# Patient Record
Sex: Male | Born: 1970 | Race: Black or African American | Hispanic: No | Marital: Single | Smoking: Former smoker
Health system: Southern US, Community
[De-identification: ages and names within clinical notes are randomized; demographics above are authoritative.]

## PROBLEM LIST (undated history)

## (undated) DIAGNOSIS — R519 Headache, unspecified: Secondary | ICD-10-CM

## (undated) DIAGNOSIS — Z9289 Personal history of other medical treatment: Secondary | ICD-10-CM

## (undated) DIAGNOSIS — I82409 Acute embolism and thrombosis of unspecified deep veins of unspecified lower extremity: Secondary | ICD-10-CM

## (undated) DIAGNOSIS — K219 Gastro-esophageal reflux disease without esophagitis: Secondary | ICD-10-CM

## (undated) DIAGNOSIS — F419 Anxiety disorder, unspecified: Secondary | ICD-10-CM

## (undated) DIAGNOSIS — Z789 Other specified health status: Secondary | ICD-10-CM

## (undated) DIAGNOSIS — R9431 Abnormal electrocardiogram [ECG] [EKG]: Secondary | ICD-10-CM

## (undated) DIAGNOSIS — L89159 Pressure ulcer of sacral region, unspecified stage: Secondary | ICD-10-CM

## (undated) DIAGNOSIS — E43 Unspecified severe protein-calorie malnutrition: Secondary | ICD-10-CM

## (undated) DIAGNOSIS — R51 Headache: Secondary | ICD-10-CM

## (undated) DIAGNOSIS — F32A Depression, unspecified: Secondary | ICD-10-CM

## (undated) DIAGNOSIS — F329 Major depressive disorder, single episode, unspecified: Secondary | ICD-10-CM

## (undated) DIAGNOSIS — A150 Tuberculosis of lung: Secondary | ICD-10-CM

## (undated) DIAGNOSIS — N390 Urinary tract infection, site not specified: Secondary | ICD-10-CM

## (undated) DIAGNOSIS — D649 Anemia, unspecified: Secondary | ICD-10-CM

## (undated) DIAGNOSIS — G8220 Paraplegia, unspecified: Secondary | ICD-10-CM

## (undated) HISTORY — DX: Pressure ulcer of sacral region, unspecified stage: L89.159

## (undated) HISTORY — PX: HIP SURGERY: SHX245

## (undated) HISTORY — DX: Paraplegia, unspecified: G82.20

## (undated) HISTORY — DX: Unspecified severe protein-calorie malnutrition: E43

## (undated) HISTORY — DX: Urinary tract infection, site not specified: N39.0

## (undated) HISTORY — PX: DEBRIDMENT OF DECUBITUS ULCER: SHX6276

## (undated) HISTORY — DX: Acute embolism and thrombosis of unspecified deep veins of unspecified lower extremity: I82.409

---
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
## 2004-07-07 DIAGNOSIS — I82409 Acute embolism and thrombosis of unspecified deep veins of unspecified lower extremity: Secondary | ICD-10-CM

## 2004-07-07 HISTORY — DX: Acute embolism and thrombosis of unspecified deep veins of unspecified lower extremity: I82.409

## 2004-07-07 HISTORY — PX: OTHER SURGICAL HISTORY: SHX169

## 2005-04-01 ENCOUNTER — Emergency Department (HOSPITAL_COMMUNITY): Admission: EM | Admit: 2005-04-01 | Discharge: 2005-04-01 | Payer: Self-pay | Admitting: Emergency Medicine

## 2005-04-06 HISTORY — PX: VENA CAVA FILTER PLACEMENT: SUR1032

## 2005-04-24 ENCOUNTER — Inpatient Hospital Stay (HOSPITAL_COMMUNITY): Admission: EM | Admit: 2005-04-24 | Discharge: 2005-05-01 | Payer: Self-pay | Admitting: Emergency Medicine

## 2005-04-24 ENCOUNTER — Ambulatory Visit: Payer: Self-pay | Admitting: Physical Medicine & Rehabilitation

## 2005-05-01 ENCOUNTER — Inpatient Hospital Stay (HOSPITAL_COMMUNITY)
Admission: RE | Admit: 2005-05-01 | Discharge: 2005-06-03 | Payer: Self-pay | Admitting: Physical Medicine & Rehabilitation

## 2005-05-01 ENCOUNTER — Ambulatory Visit: Payer: Self-pay | Admitting: Infectious Diseases

## 2005-07-10 ENCOUNTER — Ambulatory Visit: Payer: Self-pay | Admitting: Physical Medicine & Rehabilitation

## 2005-07-10 ENCOUNTER — Encounter
Admission: RE | Admit: 2005-07-10 | Discharge: 2005-10-08 | Payer: Self-pay | Admitting: Physical Medicine & Rehabilitation

## 2005-07-28 ENCOUNTER — Encounter
Admission: RE | Admit: 2005-07-28 | Discharge: 2005-09-22 | Payer: Self-pay | Admitting: Physical Medicine & Rehabilitation

## 2005-07-30 ENCOUNTER — Ambulatory Visit: Payer: Self-pay | Admitting: Internal Medicine

## 2005-08-11 ENCOUNTER — Ambulatory Visit: Payer: Self-pay | Admitting: Internal Medicine

## 2005-08-11 ENCOUNTER — Inpatient Hospital Stay (HOSPITAL_COMMUNITY): Admission: EM | Admit: 2005-08-11 | Discharge: 2005-08-18 | Payer: Self-pay | Admitting: Emergency Medicine

## 2005-08-14 ENCOUNTER — Ambulatory Visit: Payer: Self-pay | Admitting: Plastic Surgery

## 2005-09-01 ENCOUNTER — Ambulatory Visit: Payer: Self-pay | Admitting: Cardiology

## 2005-09-01 ENCOUNTER — Ambulatory Visit: Payer: Self-pay | Admitting: Internal Medicine

## 2005-09-01 ENCOUNTER — Inpatient Hospital Stay (HOSPITAL_COMMUNITY): Admission: EM | Admit: 2005-09-01 | Discharge: 2005-09-08 | Payer: Self-pay | Admitting: Emergency Medicine

## 2005-09-05 ENCOUNTER — Encounter: Payer: Self-pay | Admitting: Cardiology

## 2005-09-29 ENCOUNTER — Ambulatory Visit: Payer: Self-pay | Admitting: Internal Medicine

## 2005-11-20 ENCOUNTER — Encounter
Admission: RE | Admit: 2005-11-20 | Discharge: 2006-02-18 | Payer: Self-pay | Admitting: Physical Medicine & Rehabilitation

## 2005-11-20 ENCOUNTER — Ambulatory Visit: Payer: Self-pay | Admitting: Physical Medicine & Rehabilitation

## 2005-11-20 ENCOUNTER — Ambulatory Visit: Payer: Self-pay | Admitting: Internal Medicine

## 2005-12-29 ENCOUNTER — Emergency Department (HOSPITAL_COMMUNITY): Admission: EM | Admit: 2005-12-29 | Discharge: 2005-12-29 | Payer: Self-pay | Admitting: Emergency Medicine

## 2006-01-14 ENCOUNTER — Ambulatory Visit: Payer: Self-pay | Admitting: Internal Medicine

## 2006-03-12 ENCOUNTER — Ambulatory Visit: Payer: Self-pay | Admitting: Hospitalist

## 2006-04-17 ENCOUNTER — Encounter (INDEPENDENT_AMBULATORY_CARE_PROVIDER_SITE_OTHER): Payer: Self-pay | Admitting: Internal Medicine

## 2006-04-17 ENCOUNTER — Ambulatory Visit: Payer: Self-pay | Admitting: Internal Medicine

## 2006-04-17 LAB — CONVERTED CEMR LAB
Bilirubin Urine: NEGATIVE
Hgb urine dipstick: NEGATIVE
Ketones, ur: NEGATIVE mg/dL
Nitrite: NEGATIVE
Urobilinogen, UA: 0.2 (ref 0.0–1.0)

## 2006-05-07 DIAGNOSIS — L89159 Pressure ulcer of sacral region, unspecified stage: Secondary | ICD-10-CM

## 2006-05-07 HISTORY — DX: Pressure ulcer of sacral region, unspecified stage: L89.159

## 2006-05-13 ENCOUNTER — Emergency Department (HOSPITAL_COMMUNITY): Admission: EM | Admit: 2006-05-13 | Discharge: 2006-05-14 | Payer: Self-pay | Admitting: Emergency Medicine

## 2006-05-18 ENCOUNTER — Ambulatory Visit: Payer: Self-pay | Admitting: *Deleted

## 2006-05-18 ENCOUNTER — Inpatient Hospital Stay (HOSPITAL_COMMUNITY): Admission: AD | Admit: 2006-05-18 | Discharge: 2006-06-02 | Payer: Self-pay | Admitting: Hospitalist

## 2006-05-18 ENCOUNTER — Ambulatory Visit: Payer: Self-pay | Admitting: Hospitalist

## 2006-05-19 ENCOUNTER — Encounter (INDEPENDENT_AMBULATORY_CARE_PROVIDER_SITE_OTHER): Payer: Self-pay | Admitting: Internal Medicine

## 2006-05-19 LAB — CONVERTED CEMR LAB
Bacteria, UA: NONE SEEN
Bilirubin Urine: NEGATIVE
Hemoglobin, Urine: NEGATIVE
Ketones, ur: NEGATIVE mg/dL
Nitrite: NEGATIVE
Protein, ur: NEGATIVE mg/dL
RBC / HPF: NONE SEEN
Specific Gravity, Urine: 1.006
Urine Glucose: NEGATIVE mg/dL
Urobilinogen, UA: 0.2
WBC, UA: NONE SEEN {cells}/[HPF]
pH: 7

## 2006-05-22 ENCOUNTER — Encounter (INDEPENDENT_AMBULATORY_CARE_PROVIDER_SITE_OTHER): Payer: Self-pay | Admitting: *Deleted

## 2006-05-24 ENCOUNTER — Encounter (INDEPENDENT_AMBULATORY_CARE_PROVIDER_SITE_OTHER): Payer: Self-pay | Admitting: *Deleted

## 2006-05-25 ENCOUNTER — Ambulatory Visit: Payer: Self-pay | Admitting: Infectious Diseases

## 2006-07-16 ENCOUNTER — Ambulatory Visit: Payer: Self-pay | Admitting: Internal Medicine

## 2006-07-16 ENCOUNTER — Encounter (INDEPENDENT_AMBULATORY_CARE_PROVIDER_SITE_OTHER): Payer: Self-pay | Admitting: Internal Medicine

## 2006-07-16 LAB — CONVERTED CEMR LAB
BUN: 11 mg/dL (ref 6–23)
Bilirubin Urine: NEGATIVE
Chloride: 104 meq/L (ref 96–112)
Glucose, Bld: 95 mg/dL (ref 70–99)
Leukocytes, UA: NEGATIVE
Potassium: 4.5 meq/L (ref 3.5–5.3)
Protein, ur: NEGATIVE mg/dL
Sodium: 140 meq/L (ref 135–145)
Urine Glucose: NEGATIVE mg/dL
pH: 6 (ref 5.0–8.0)

## 2006-07-28 ENCOUNTER — Telehealth (INDEPENDENT_AMBULATORY_CARE_PROVIDER_SITE_OTHER): Payer: Self-pay | Admitting: Internal Medicine

## 2006-07-30 ENCOUNTER — Telehealth (INDEPENDENT_AMBULATORY_CARE_PROVIDER_SITE_OTHER): Payer: Self-pay | Admitting: Internal Medicine

## 2006-07-31 ENCOUNTER — Telehealth (INDEPENDENT_AMBULATORY_CARE_PROVIDER_SITE_OTHER): Payer: Self-pay | Admitting: Internal Medicine

## 2006-08-18 ENCOUNTER — Encounter (INDEPENDENT_AMBULATORY_CARE_PROVIDER_SITE_OTHER): Payer: Self-pay | Admitting: Internal Medicine

## 2006-08-19 ENCOUNTER — Telehealth: Payer: Self-pay | Admitting: *Deleted

## 2006-08-19 ENCOUNTER — Encounter (INDEPENDENT_AMBULATORY_CARE_PROVIDER_SITE_OTHER): Payer: Self-pay | Admitting: Internal Medicine

## 2006-08-25 ENCOUNTER — Encounter (INDEPENDENT_AMBULATORY_CARE_PROVIDER_SITE_OTHER): Payer: Self-pay | Admitting: Internal Medicine

## 2006-09-01 ENCOUNTER — Encounter (INDEPENDENT_AMBULATORY_CARE_PROVIDER_SITE_OTHER): Payer: Self-pay | Admitting: Internal Medicine

## 2006-09-07 ENCOUNTER — Encounter (INDEPENDENT_AMBULATORY_CARE_PROVIDER_SITE_OTHER): Payer: Self-pay | Admitting: Internal Medicine

## 2006-09-09 ENCOUNTER — Telehealth (INDEPENDENT_AMBULATORY_CARE_PROVIDER_SITE_OTHER): Payer: Self-pay | Admitting: Internal Medicine

## 2006-09-14 ENCOUNTER — Telehealth (INDEPENDENT_AMBULATORY_CARE_PROVIDER_SITE_OTHER): Payer: Self-pay | Admitting: Internal Medicine

## 2006-09-28 ENCOUNTER — Encounter (INDEPENDENT_AMBULATORY_CARE_PROVIDER_SITE_OTHER): Payer: Self-pay | Admitting: Internal Medicine

## 2006-09-30 ENCOUNTER — Encounter (INDEPENDENT_AMBULATORY_CARE_PROVIDER_SITE_OTHER): Payer: Self-pay | Admitting: Internal Medicine

## 2006-10-21 ENCOUNTER — Encounter (INDEPENDENT_AMBULATORY_CARE_PROVIDER_SITE_OTHER): Payer: Self-pay | Admitting: Internal Medicine

## 2006-10-22 ENCOUNTER — Encounter (INDEPENDENT_AMBULATORY_CARE_PROVIDER_SITE_OTHER): Payer: Self-pay | Admitting: Internal Medicine

## 2006-10-29 ENCOUNTER — Encounter (INDEPENDENT_AMBULATORY_CARE_PROVIDER_SITE_OTHER): Payer: Self-pay | Admitting: Internal Medicine

## 2006-11-19 ENCOUNTER — Encounter (INDEPENDENT_AMBULATORY_CARE_PROVIDER_SITE_OTHER): Payer: Self-pay | Admitting: Internal Medicine

## 2006-12-08 ENCOUNTER — Telehealth (INDEPENDENT_AMBULATORY_CARE_PROVIDER_SITE_OTHER): Payer: Self-pay | Admitting: Internal Medicine

## 2006-12-09 ENCOUNTER — Encounter (INDEPENDENT_AMBULATORY_CARE_PROVIDER_SITE_OTHER): Payer: Self-pay | Admitting: Internal Medicine

## 2006-12-09 ENCOUNTER — Encounter (INDEPENDENT_AMBULATORY_CARE_PROVIDER_SITE_OTHER): Payer: Self-pay | Admitting: *Deleted

## 2006-12-11 ENCOUNTER — Encounter (INDEPENDENT_AMBULATORY_CARE_PROVIDER_SITE_OTHER): Payer: Self-pay | Admitting: Internal Medicine

## 2007-01-11 ENCOUNTER — Encounter (INDEPENDENT_AMBULATORY_CARE_PROVIDER_SITE_OTHER): Payer: Self-pay | Admitting: *Deleted

## 2007-02-09 ENCOUNTER — Ambulatory Visit: Payer: Self-pay | Admitting: Infectious Disease

## 2007-02-09 DIAGNOSIS — G8220 Paraplegia, unspecified: Secondary | ICD-10-CM

## 2007-02-09 DIAGNOSIS — M549 Dorsalgia, unspecified: Secondary | ICD-10-CM

## 2007-02-09 DIAGNOSIS — N319 Neuromuscular dysfunction of bladder, unspecified: Secondary | ICD-10-CM

## 2007-02-09 DIAGNOSIS — N390 Urinary tract infection, site not specified: Secondary | ICD-10-CM

## 2007-02-23 ENCOUNTER — Encounter (INDEPENDENT_AMBULATORY_CARE_PROVIDER_SITE_OTHER): Payer: Self-pay | Admitting: *Deleted

## 2007-02-23 ENCOUNTER — Telehealth (INDEPENDENT_AMBULATORY_CARE_PROVIDER_SITE_OTHER): Payer: Self-pay | Admitting: *Deleted

## 2007-02-25 ENCOUNTER — Encounter (INDEPENDENT_AMBULATORY_CARE_PROVIDER_SITE_OTHER): Payer: Self-pay | Admitting: *Deleted

## 2007-03-01 ENCOUNTER — Telehealth: Payer: Self-pay | Admitting: *Deleted

## 2007-03-04 ENCOUNTER — Encounter (INDEPENDENT_AMBULATORY_CARE_PROVIDER_SITE_OTHER): Payer: Self-pay | Admitting: *Deleted

## 2007-03-05 ENCOUNTER — Telehealth: Payer: Self-pay | Admitting: *Deleted

## 2007-03-09 ENCOUNTER — Encounter (INDEPENDENT_AMBULATORY_CARE_PROVIDER_SITE_OTHER): Payer: Self-pay | Admitting: *Deleted

## 2007-03-23 ENCOUNTER — Encounter (INDEPENDENT_AMBULATORY_CARE_PROVIDER_SITE_OTHER): Payer: Self-pay | Admitting: *Deleted

## 2007-04-14 ENCOUNTER — Encounter (INDEPENDENT_AMBULATORY_CARE_PROVIDER_SITE_OTHER): Payer: Self-pay | Admitting: *Deleted

## 2007-04-21 ENCOUNTER — Encounter (INDEPENDENT_AMBULATORY_CARE_PROVIDER_SITE_OTHER): Payer: Self-pay | Admitting: *Deleted

## 2007-04-22 ENCOUNTER — Encounter (INDEPENDENT_AMBULATORY_CARE_PROVIDER_SITE_OTHER): Payer: Self-pay | Admitting: *Deleted

## 2007-04-22 ENCOUNTER — Telehealth (INDEPENDENT_AMBULATORY_CARE_PROVIDER_SITE_OTHER): Payer: Self-pay | Admitting: *Deleted

## 2007-04-23 ENCOUNTER — Encounter (INDEPENDENT_AMBULATORY_CARE_PROVIDER_SITE_OTHER): Payer: Self-pay | Admitting: *Deleted

## 2007-04-29 ENCOUNTER — Telehealth: Payer: Self-pay | Admitting: *Deleted

## 2007-05-10 ENCOUNTER — Telehealth: Payer: Self-pay | Admitting: *Deleted

## 2007-05-12 ENCOUNTER — Encounter (INDEPENDENT_AMBULATORY_CARE_PROVIDER_SITE_OTHER): Payer: Self-pay | Admitting: *Deleted

## 2007-05-17 ENCOUNTER — Telehealth: Payer: Self-pay | Admitting: *Deleted

## 2007-05-19 ENCOUNTER — Encounter (INDEPENDENT_AMBULATORY_CARE_PROVIDER_SITE_OTHER): Payer: Self-pay | Admitting: *Deleted

## 2007-06-11 ENCOUNTER — Telehealth: Payer: Self-pay | Admitting: *Deleted

## 2007-06-11 ENCOUNTER — Encounter (INDEPENDENT_AMBULATORY_CARE_PROVIDER_SITE_OTHER): Payer: Self-pay | Admitting: *Deleted

## 2007-06-17 ENCOUNTER — Telehealth (INDEPENDENT_AMBULATORY_CARE_PROVIDER_SITE_OTHER): Payer: Self-pay | Admitting: *Deleted

## 2007-07-08 DIAGNOSIS — I82409 Acute embolism and thrombosis of unspecified deep veins of unspecified lower extremity: Secondary | ICD-10-CM

## 2007-07-08 HISTORY — DX: Acute embolism and thrombosis of unspecified deep veins of unspecified lower extremity: I82.409

## 2007-07-15 ENCOUNTER — Ambulatory Visit: Payer: Self-pay | Admitting: Internal Medicine

## 2007-07-15 ENCOUNTER — Encounter (INDEPENDENT_AMBULATORY_CARE_PROVIDER_SITE_OTHER): Payer: Self-pay | Admitting: *Deleted

## 2007-07-15 ENCOUNTER — Ambulatory Visit: Payer: Self-pay | Admitting: Vascular Surgery

## 2007-07-15 ENCOUNTER — Ambulatory Visit (HOSPITAL_COMMUNITY): Admission: RE | Admit: 2007-07-15 | Discharge: 2007-07-15 | Payer: Self-pay | Admitting: Internal Medicine

## 2007-07-16 ENCOUNTER — Ambulatory Visit: Payer: Self-pay | Admitting: Internal Medicine

## 2007-07-16 ENCOUNTER — Inpatient Hospital Stay (HOSPITAL_COMMUNITY): Admission: AD | Admit: 2007-07-16 | Discharge: 2007-07-19 | Payer: Self-pay | Admitting: Hospitalist

## 2007-07-23 ENCOUNTER — Encounter (INDEPENDENT_AMBULATORY_CARE_PROVIDER_SITE_OTHER): Payer: Self-pay | Admitting: *Deleted

## 2007-07-23 ENCOUNTER — Telehealth: Payer: Self-pay | Admitting: Infectious Disease

## 2007-07-26 ENCOUNTER — Encounter (INDEPENDENT_AMBULATORY_CARE_PROVIDER_SITE_OTHER): Payer: Self-pay | Admitting: *Deleted

## 2007-07-29 ENCOUNTER — Ambulatory Visit: Payer: Self-pay | Admitting: Internal Medicine

## 2007-07-29 ENCOUNTER — Encounter (INDEPENDENT_AMBULATORY_CARE_PROVIDER_SITE_OTHER): Payer: Self-pay | Admitting: *Deleted

## 2007-07-29 LAB — CONVERTED CEMR LAB: INR: 5.7

## 2007-08-02 ENCOUNTER — Telehealth (INDEPENDENT_AMBULATORY_CARE_PROVIDER_SITE_OTHER): Payer: Self-pay | Admitting: Pharmacist

## 2007-08-09 ENCOUNTER — Encounter (INDEPENDENT_AMBULATORY_CARE_PROVIDER_SITE_OTHER): Payer: Self-pay | Admitting: *Deleted

## 2007-08-11 ENCOUNTER — Encounter (INDEPENDENT_AMBULATORY_CARE_PROVIDER_SITE_OTHER): Payer: Self-pay | Admitting: *Deleted

## 2007-08-16 ENCOUNTER — Telehealth (INDEPENDENT_AMBULATORY_CARE_PROVIDER_SITE_OTHER): Payer: Self-pay | Admitting: *Deleted

## 2007-08-17 ENCOUNTER — Encounter (INDEPENDENT_AMBULATORY_CARE_PROVIDER_SITE_OTHER): Payer: Self-pay | Admitting: *Deleted

## 2007-08-18 ENCOUNTER — Ambulatory Visit: Payer: Self-pay | Admitting: Internal Medicine

## 2007-08-18 ENCOUNTER — Encounter (INDEPENDENT_AMBULATORY_CARE_PROVIDER_SITE_OTHER): Payer: Self-pay | Admitting: *Deleted

## 2007-08-24 ENCOUNTER — Encounter (INDEPENDENT_AMBULATORY_CARE_PROVIDER_SITE_OTHER): Payer: Self-pay | Admitting: *Deleted

## 2007-08-24 ENCOUNTER — Telehealth (INDEPENDENT_AMBULATORY_CARE_PROVIDER_SITE_OTHER): Payer: Self-pay | Admitting: *Deleted

## 2007-09-01 ENCOUNTER — Telehealth (INDEPENDENT_AMBULATORY_CARE_PROVIDER_SITE_OTHER): Payer: Self-pay | Admitting: Pharmacist

## 2007-09-01 ENCOUNTER — Encounter (INDEPENDENT_AMBULATORY_CARE_PROVIDER_SITE_OTHER): Payer: Self-pay | Admitting: *Deleted

## 2007-09-09 ENCOUNTER — Encounter (INDEPENDENT_AMBULATORY_CARE_PROVIDER_SITE_OTHER): Payer: Self-pay | Admitting: *Deleted

## 2007-09-13 ENCOUNTER — Telehealth (INDEPENDENT_AMBULATORY_CARE_PROVIDER_SITE_OTHER): Payer: Self-pay | Admitting: Pharmacist

## 2007-09-27 ENCOUNTER — Encounter (INDEPENDENT_AMBULATORY_CARE_PROVIDER_SITE_OTHER): Payer: Self-pay | Admitting: *Deleted

## 2007-09-28 ENCOUNTER — Encounter (INDEPENDENT_AMBULATORY_CARE_PROVIDER_SITE_OTHER): Payer: Self-pay | Admitting: *Deleted

## 2007-09-30 ENCOUNTER — Telehealth (INDEPENDENT_AMBULATORY_CARE_PROVIDER_SITE_OTHER): Payer: Self-pay | Admitting: *Deleted

## 2007-09-30 ENCOUNTER — Encounter (INDEPENDENT_AMBULATORY_CARE_PROVIDER_SITE_OTHER): Payer: Self-pay | Admitting: *Deleted

## 2007-10-05 ENCOUNTER — Telehealth (INDEPENDENT_AMBULATORY_CARE_PROVIDER_SITE_OTHER): Payer: Self-pay | Admitting: *Deleted

## 2007-10-06 ENCOUNTER — Encounter (INDEPENDENT_AMBULATORY_CARE_PROVIDER_SITE_OTHER): Payer: Self-pay | Admitting: *Deleted

## 2007-10-06 ENCOUNTER — Telehealth (INDEPENDENT_AMBULATORY_CARE_PROVIDER_SITE_OTHER): Payer: Self-pay | Admitting: *Deleted

## 2007-10-07 ENCOUNTER — Encounter (INDEPENDENT_AMBULATORY_CARE_PROVIDER_SITE_OTHER): Payer: Self-pay | Admitting: *Deleted

## 2007-11-01 ENCOUNTER — Telehealth: Payer: Self-pay | Admitting: *Deleted

## 2007-11-10 ENCOUNTER — Encounter (INDEPENDENT_AMBULATORY_CARE_PROVIDER_SITE_OTHER): Payer: Self-pay | Admitting: *Deleted

## 2007-11-15 ENCOUNTER — Ambulatory Visit: Payer: Self-pay | Admitting: *Deleted

## 2007-11-15 ENCOUNTER — Encounter (INDEPENDENT_AMBULATORY_CARE_PROVIDER_SITE_OTHER): Payer: Self-pay | Admitting: *Deleted

## 2007-11-15 LAB — CONVERTED CEMR LAB: INR: 1.9

## 2007-11-18 ENCOUNTER — Telehealth (INDEPENDENT_AMBULATORY_CARE_PROVIDER_SITE_OTHER): Payer: Self-pay | Admitting: *Deleted

## 2007-11-22 ENCOUNTER — Encounter (INDEPENDENT_AMBULATORY_CARE_PROVIDER_SITE_OTHER): Payer: Self-pay | Admitting: *Deleted

## 2007-11-24 ENCOUNTER — Encounter (INDEPENDENT_AMBULATORY_CARE_PROVIDER_SITE_OTHER): Payer: Self-pay | Admitting: *Deleted

## 2007-12-13 ENCOUNTER — Ambulatory Visit: Payer: Self-pay | Admitting: *Deleted

## 2007-12-13 LAB — CONVERTED CEMR LAB: INR: 3.5

## 2007-12-20 ENCOUNTER — Telehealth: Payer: Self-pay | Admitting: *Deleted

## 2007-12-30 ENCOUNTER — Ambulatory Visit: Payer: Self-pay | Admitting: Internal Medicine

## 2007-12-30 ENCOUNTER — Encounter (INDEPENDENT_AMBULATORY_CARE_PROVIDER_SITE_OTHER): Payer: Self-pay | Admitting: *Deleted

## 2008-01-17 ENCOUNTER — Telehealth: Payer: Self-pay | Admitting: *Deleted

## 2008-01-19 ENCOUNTER — Encounter: Payer: Self-pay | Admitting: Infectious Diseases

## 2008-01-26 ENCOUNTER — Encounter (INDEPENDENT_AMBULATORY_CARE_PROVIDER_SITE_OTHER): Payer: Self-pay | Admitting: *Deleted

## 2008-02-04 ENCOUNTER — Telehealth: Payer: Self-pay | Admitting: Internal Medicine

## 2008-02-07 ENCOUNTER — Encounter (INDEPENDENT_AMBULATORY_CARE_PROVIDER_SITE_OTHER): Payer: Self-pay | Admitting: *Deleted

## 2008-02-18 ENCOUNTER — Encounter (INDEPENDENT_AMBULATORY_CARE_PROVIDER_SITE_OTHER): Payer: Self-pay | Admitting: *Deleted

## 2008-02-18 ENCOUNTER — Telehealth (INDEPENDENT_AMBULATORY_CARE_PROVIDER_SITE_OTHER): Payer: Self-pay | Admitting: *Deleted

## 2008-02-29 ENCOUNTER — Telehealth (INDEPENDENT_AMBULATORY_CARE_PROVIDER_SITE_OTHER): Payer: Self-pay | Admitting: *Deleted

## 2008-03-01 ENCOUNTER — Encounter (INDEPENDENT_AMBULATORY_CARE_PROVIDER_SITE_OTHER): Payer: Self-pay | Admitting: *Deleted

## 2008-03-03 ENCOUNTER — Telehealth (INDEPENDENT_AMBULATORY_CARE_PROVIDER_SITE_OTHER): Payer: Self-pay | Admitting: *Deleted

## 2008-03-03 ENCOUNTER — Encounter (INDEPENDENT_AMBULATORY_CARE_PROVIDER_SITE_OTHER): Payer: Self-pay | Admitting: *Deleted

## 2008-03-03 ENCOUNTER — Encounter: Payer: Self-pay | Admitting: Internal Medicine

## 2008-03-14 ENCOUNTER — Encounter
Admission: RE | Admit: 2008-03-14 | Discharge: 2008-06-12 | Payer: Self-pay | Admitting: Physical Medicine and Rehabilitation

## 2008-03-15 ENCOUNTER — Encounter (INDEPENDENT_AMBULATORY_CARE_PROVIDER_SITE_OTHER): Payer: Self-pay | Admitting: *Deleted

## 2008-03-20 ENCOUNTER — Telehealth (INDEPENDENT_AMBULATORY_CARE_PROVIDER_SITE_OTHER): Payer: Self-pay | Admitting: *Deleted

## 2008-03-30 ENCOUNTER — Ambulatory Visit: Payer: Self-pay | Admitting: Internal Medicine

## 2008-04-06 ENCOUNTER — Telehealth (INDEPENDENT_AMBULATORY_CARE_PROVIDER_SITE_OTHER): Payer: Self-pay | Admitting: *Deleted

## 2008-04-12 ENCOUNTER — Encounter (INDEPENDENT_AMBULATORY_CARE_PROVIDER_SITE_OTHER): Payer: Self-pay | Admitting: *Deleted

## 2008-04-12 ENCOUNTER — Ambulatory Visit: Payer: Self-pay | Admitting: Infectious Disease

## 2008-04-12 ENCOUNTER — Telehealth (INDEPENDENT_AMBULATORY_CARE_PROVIDER_SITE_OTHER): Payer: Self-pay | Admitting: *Deleted

## 2008-04-14 ENCOUNTER — Encounter (INDEPENDENT_AMBULATORY_CARE_PROVIDER_SITE_OTHER): Payer: Self-pay | Admitting: *Deleted

## 2008-04-14 LAB — CONVERTED CEMR LAB
Bilirubin Urine: NEGATIVE
Hemoglobin, Urine: NEGATIVE
Ketones, ur: NEGATIVE mg/dL
Protein, ur: NEGATIVE mg/dL
RBC / HPF: NONE SEEN (ref <3)
Urine Glucose: NEGATIVE mg/dL
pH: 6.5 (ref 5.0–8.0)

## 2008-04-19 ENCOUNTER — Telehealth (INDEPENDENT_AMBULATORY_CARE_PROVIDER_SITE_OTHER): Payer: Self-pay | Admitting: *Deleted

## 2008-04-24 ENCOUNTER — Encounter (INDEPENDENT_AMBULATORY_CARE_PROVIDER_SITE_OTHER): Payer: Self-pay | Admitting: Internal Medicine

## 2008-04-26 ENCOUNTER — Telehealth (INDEPENDENT_AMBULATORY_CARE_PROVIDER_SITE_OTHER): Payer: Self-pay | Admitting: *Deleted

## 2008-05-03 ENCOUNTER — Telehealth: Payer: Self-pay | Admitting: *Deleted

## 2008-05-04 ENCOUNTER — Emergency Department (HOSPITAL_COMMUNITY): Admission: EM | Admit: 2008-05-04 | Discharge: 2008-05-04 | Payer: Self-pay | Admitting: Emergency Medicine

## 2008-05-09 ENCOUNTER — Ambulatory Visit: Payer: Self-pay | Admitting: Internal Medicine

## 2008-05-09 ENCOUNTER — Ambulatory Visit (HOSPITAL_COMMUNITY): Admission: RE | Admit: 2008-05-09 | Discharge: 2008-05-09 | Payer: Self-pay | Admitting: Internal Medicine

## 2008-05-09 ENCOUNTER — Encounter (INDEPENDENT_AMBULATORY_CARE_PROVIDER_SITE_OTHER): Payer: Self-pay | Admitting: *Deleted

## 2008-05-09 LAB — CONVERTED CEMR LAB: INR: 2

## 2008-05-10 ENCOUNTER — Telehealth: Payer: Self-pay | Admitting: *Deleted

## 2008-05-10 ENCOUNTER — Encounter (INDEPENDENT_AMBULATORY_CARE_PROVIDER_SITE_OTHER): Payer: Self-pay | Admitting: *Deleted

## 2008-05-22 ENCOUNTER — Telehealth: Payer: Self-pay | Admitting: *Deleted

## 2008-05-23 ENCOUNTER — Ambulatory Visit: Payer: Self-pay | Admitting: Infectious Diseases

## 2008-05-25 ENCOUNTER — Encounter (INDEPENDENT_AMBULATORY_CARE_PROVIDER_SITE_OTHER): Payer: Self-pay | Admitting: *Deleted

## 2008-05-25 ENCOUNTER — Telehealth: Payer: Self-pay | Admitting: *Deleted

## 2008-05-30 ENCOUNTER — Encounter (INDEPENDENT_AMBULATORY_CARE_PROVIDER_SITE_OTHER): Payer: Self-pay | Admitting: *Deleted

## 2008-06-05 ENCOUNTER — Encounter (INDEPENDENT_AMBULATORY_CARE_PROVIDER_SITE_OTHER): Payer: Self-pay | Admitting: *Deleted

## 2008-06-21 ENCOUNTER — Telehealth (INDEPENDENT_AMBULATORY_CARE_PROVIDER_SITE_OTHER): Payer: Self-pay | Admitting: *Deleted

## 2008-06-26 ENCOUNTER — Telehealth: Payer: Self-pay | Admitting: Infectious Diseases

## 2008-06-26 ENCOUNTER — Encounter: Payer: Self-pay | Admitting: Infectious Diseases

## 2008-06-28 ENCOUNTER — Telehealth (INDEPENDENT_AMBULATORY_CARE_PROVIDER_SITE_OTHER): Payer: Self-pay | Admitting: Pharmacy Technician

## 2008-07-11 ENCOUNTER — Encounter (INDEPENDENT_AMBULATORY_CARE_PROVIDER_SITE_OTHER): Payer: Self-pay | Admitting: *Deleted

## 2008-07-28 ENCOUNTER — Telehealth (INDEPENDENT_AMBULATORY_CARE_PROVIDER_SITE_OTHER): Payer: Self-pay | Admitting: *Deleted

## 2008-07-31 ENCOUNTER — Telehealth: Payer: Self-pay | Admitting: *Deleted

## 2008-08-03 ENCOUNTER — Encounter (INDEPENDENT_AMBULATORY_CARE_PROVIDER_SITE_OTHER): Payer: Self-pay | Admitting: Internal Medicine

## 2008-08-10 ENCOUNTER — Telehealth (INDEPENDENT_AMBULATORY_CARE_PROVIDER_SITE_OTHER): Payer: Self-pay | Admitting: *Deleted

## 2008-08-21 ENCOUNTER — Encounter (INDEPENDENT_AMBULATORY_CARE_PROVIDER_SITE_OTHER): Payer: Self-pay | Admitting: *Deleted

## 2008-09-01 ENCOUNTER — Telehealth: Payer: Self-pay | Admitting: *Deleted

## 2008-09-06 ENCOUNTER — Encounter (INDEPENDENT_AMBULATORY_CARE_PROVIDER_SITE_OTHER): Payer: Self-pay | Admitting: *Deleted

## 2008-09-08 ENCOUNTER — Encounter (INDEPENDENT_AMBULATORY_CARE_PROVIDER_SITE_OTHER): Payer: Self-pay | Admitting: *Deleted

## 2008-09-21 ENCOUNTER — Telehealth (INDEPENDENT_AMBULATORY_CARE_PROVIDER_SITE_OTHER): Payer: Self-pay | Admitting: *Deleted

## 2008-10-09 ENCOUNTER — Telehealth (INDEPENDENT_AMBULATORY_CARE_PROVIDER_SITE_OTHER): Payer: Self-pay | Admitting: *Deleted

## 2008-10-11 ENCOUNTER — Encounter (INDEPENDENT_AMBULATORY_CARE_PROVIDER_SITE_OTHER): Payer: Self-pay | Admitting: *Deleted

## 2008-10-11 ENCOUNTER — Ambulatory Visit: Payer: Self-pay | Admitting: Internal Medicine

## 2008-10-12 ENCOUNTER — Encounter (INDEPENDENT_AMBULATORY_CARE_PROVIDER_SITE_OTHER): Payer: Self-pay | Admitting: *Deleted

## 2008-10-19 ENCOUNTER — Telehealth: Payer: Self-pay | Admitting: Internal Medicine

## 2008-10-20 ENCOUNTER — Encounter (INDEPENDENT_AMBULATORY_CARE_PROVIDER_SITE_OTHER): Payer: Self-pay | Admitting: *Deleted

## 2008-11-09 ENCOUNTER — Encounter (INDEPENDENT_AMBULATORY_CARE_PROVIDER_SITE_OTHER): Payer: Self-pay | Admitting: Internal Medicine

## 2008-11-09 ENCOUNTER — Telehealth (INDEPENDENT_AMBULATORY_CARE_PROVIDER_SITE_OTHER): Payer: Self-pay | Admitting: Internal Medicine

## 2008-11-14 ENCOUNTER — Encounter (INDEPENDENT_AMBULATORY_CARE_PROVIDER_SITE_OTHER): Payer: Self-pay | Admitting: *Deleted

## 2008-12-11 ENCOUNTER — Telehealth: Payer: Self-pay | Admitting: *Deleted

## 2008-12-12 ENCOUNTER — Encounter: Payer: Self-pay | Admitting: Internal Medicine

## 2008-12-15 ENCOUNTER — Encounter: Payer: Self-pay | Admitting: Internal Medicine

## 2008-12-15 ENCOUNTER — Ambulatory Visit: Payer: Self-pay | Admitting: Internal Medicine

## 2008-12-15 LAB — CONVERTED CEMR LAB
Urobilinogen, UA: 0.2
WBC Urine, dipstick: NEGATIVE
pH: 5.5

## 2008-12-17 LAB — CONVERTED CEMR LAB
BUN: 11 mg/dL (ref 6–23)
Bacteria, UA: NONE SEEN
Basophils Absolute: 0 10*3/uL (ref 0.0–0.1)
Basophils Relative: 1 % (ref 0–1)
Calcium: 9.5 mg/dL (ref 8.4–10.5)
Creatinine, Ser: 0.76 mg/dL (ref 0.40–1.50)
GFR calc Af Amer: >60 mL/min (ref >60)
GFR calc non Af Amer: >60 mL/min (ref >60)
Glucose, Bld: 99 mg/dL (ref 70–99)
Hemoglobin, Urine: NEGATIVE
Lymphocytes Relative: 35 % (ref 12–46)
MCHC: 32.5 g/dL (ref 30.0–36.0)
Monocytes Absolute: 0.3 10*3/uL (ref 0.1–1.0)
Neutro Abs: 2.2 10*3/uL (ref 1.7–7.7)
Neutrophils Relative %: 54 % (ref 43–77)
Platelets: 220 10*3/uL (ref 150–400)
Potassium: 4.6 meq/L (ref 3.5–5.3)
Protein, ur: NEGATIVE mg/dL
RDW: 14.7 % (ref 11.5–15.5)
Urine Glucose: NEGATIVE mg/dL
Urobilinogen, UA: 0.2 (ref 0.0–1.0)

## 2008-12-18 ENCOUNTER — Telehealth (INDEPENDENT_AMBULATORY_CARE_PROVIDER_SITE_OTHER): Payer: Self-pay | Admitting: *Deleted

## 2008-12-18 ENCOUNTER — Ambulatory Visit: Payer: Self-pay | Admitting: Internal Medicine

## 2008-12-18 LAB — CONVERTED CEMR LAB

## 2008-12-25 ENCOUNTER — Telehealth (INDEPENDENT_AMBULATORY_CARE_PROVIDER_SITE_OTHER): Payer: Self-pay | Admitting: *Deleted

## 2008-12-27 ENCOUNTER — Emergency Department (HOSPITAL_COMMUNITY): Admission: EM | Admit: 2008-12-27 | Discharge: 2008-12-27 | Payer: Self-pay | Admitting: Emergency Medicine

## 2008-12-28 ENCOUNTER — Telehealth: Payer: Self-pay | Admitting: Internal Medicine

## 2008-12-28 ENCOUNTER — Telehealth: Payer: Self-pay | Admitting: *Deleted

## 2009-01-02 ENCOUNTER — Telehealth: Payer: Self-pay | Admitting: *Deleted

## 2009-01-10 ENCOUNTER — Telehealth (INDEPENDENT_AMBULATORY_CARE_PROVIDER_SITE_OTHER): Payer: Self-pay | Admitting: *Deleted

## 2009-01-10 ENCOUNTER — Encounter: Payer: Self-pay | Admitting: Internal Medicine

## 2009-02-07 ENCOUNTER — Telehealth (INDEPENDENT_AMBULATORY_CARE_PROVIDER_SITE_OTHER): Payer: Self-pay | Admitting: Internal Medicine

## 2009-02-08 ENCOUNTER — Telehealth: Payer: Self-pay | Admitting: *Deleted

## 2009-02-08 ENCOUNTER — Encounter (INDEPENDENT_AMBULATORY_CARE_PROVIDER_SITE_OTHER): Payer: Self-pay | Admitting: Internal Medicine

## 2009-02-09 ENCOUNTER — Encounter (INDEPENDENT_AMBULATORY_CARE_PROVIDER_SITE_OTHER): Payer: Self-pay | Admitting: Internal Medicine

## 2009-02-23 ENCOUNTER — Encounter (INDEPENDENT_AMBULATORY_CARE_PROVIDER_SITE_OTHER): Payer: Self-pay | Admitting: Internal Medicine

## 2009-03-09 ENCOUNTER — Encounter (INDEPENDENT_AMBULATORY_CARE_PROVIDER_SITE_OTHER): Payer: Self-pay | Admitting: Internal Medicine

## 2009-03-09 ENCOUNTER — Telehealth: Payer: Self-pay | Admitting: Infectious Diseases

## 2009-03-19 ENCOUNTER — Encounter (INDEPENDENT_AMBULATORY_CARE_PROVIDER_SITE_OTHER): Payer: Self-pay | Admitting: Internal Medicine

## 2009-03-20 ENCOUNTER — Encounter (INDEPENDENT_AMBULATORY_CARE_PROVIDER_SITE_OTHER): Payer: Self-pay | Admitting: Internal Medicine

## 2009-03-26 ENCOUNTER — Encounter (INDEPENDENT_AMBULATORY_CARE_PROVIDER_SITE_OTHER): Payer: Self-pay | Admitting: Internal Medicine

## 2009-04-06 ENCOUNTER — Telehealth: Payer: Self-pay | Admitting: *Deleted

## 2009-04-06 ENCOUNTER — Encounter: Payer: Self-pay | Admitting: Internal Medicine

## 2009-04-20 ENCOUNTER — Telehealth: Payer: Self-pay | Admitting: *Deleted

## 2009-05-10 ENCOUNTER — Telehealth (INDEPENDENT_AMBULATORY_CARE_PROVIDER_SITE_OTHER): Payer: Self-pay | Admitting: *Deleted

## 2009-05-10 ENCOUNTER — Encounter: Payer: Self-pay | Admitting: Internal Medicine

## 2009-05-22 ENCOUNTER — Encounter (INDEPENDENT_AMBULATORY_CARE_PROVIDER_SITE_OTHER): Payer: Self-pay | Admitting: Internal Medicine

## 2009-05-22 ENCOUNTER — Ambulatory Visit: Payer: Self-pay | Admitting: Infectious Disease

## 2009-05-22 LAB — CONVERTED CEMR LAB
Glucose, Urine, Semiquant: NEGATIVE
Hemoglobin, Urine: NEGATIVE
Ketones, urine, test strip: NEGATIVE
Nitrite: NEGATIVE
Protein, U semiquant: NEGATIVE
Protein, ur: NEGATIVE mg/dL
Specific Gravity, Urine: 1.015
pH: 5

## 2009-06-08 ENCOUNTER — Encounter (INDEPENDENT_AMBULATORY_CARE_PROVIDER_SITE_OTHER): Payer: Self-pay | Admitting: Internal Medicine

## 2009-06-08 ENCOUNTER — Telehealth: Payer: Self-pay | Admitting: *Deleted

## 2009-06-12 ENCOUNTER — Encounter (INDEPENDENT_AMBULATORY_CARE_PROVIDER_SITE_OTHER): Payer: Self-pay | Admitting: Internal Medicine

## 2009-06-18 ENCOUNTER — Telehealth: Payer: Self-pay | Admitting: Internal Medicine

## 2009-07-05 ENCOUNTER — Telehealth (INDEPENDENT_AMBULATORY_CARE_PROVIDER_SITE_OTHER): Payer: Self-pay | Admitting: Internal Medicine

## 2009-07-09 ENCOUNTER — Encounter: Payer: Self-pay | Admitting: Internal Medicine

## 2009-08-07 ENCOUNTER — Telehealth (INDEPENDENT_AMBULATORY_CARE_PROVIDER_SITE_OTHER): Payer: Self-pay | Admitting: Internal Medicine

## 2009-08-09 ENCOUNTER — Encounter: Payer: Self-pay | Admitting: Internal Medicine

## 2009-08-20 ENCOUNTER — Ambulatory Visit: Payer: Self-pay | Admitting: Internal Medicine

## 2009-08-20 LAB — CONVERTED CEMR LAB
Bilirubin Urine: NEGATIVE
Hemoglobin, Urine: NEGATIVE
Ketones, ur: NEGATIVE mg/dL
Urine Glucose: NEGATIVE mg/dL
pH: 6.5 (ref 5.0–8.0)

## 2009-09-03 ENCOUNTER — Telehealth (INDEPENDENT_AMBULATORY_CARE_PROVIDER_SITE_OTHER): Payer: Self-pay | Admitting: Internal Medicine

## 2009-09-06 ENCOUNTER — Encounter: Payer: Self-pay | Admitting: Internal Medicine

## 2009-09-07 ENCOUNTER — Telehealth: Payer: Self-pay | Admitting: Licensed Clinical Social Worker

## 2009-09-07 ENCOUNTER — Encounter (INDEPENDENT_AMBULATORY_CARE_PROVIDER_SITE_OTHER): Payer: Self-pay | Admitting: Internal Medicine

## 2009-09-12 ENCOUNTER — Encounter (INDEPENDENT_AMBULATORY_CARE_PROVIDER_SITE_OTHER): Payer: Self-pay | Admitting: Internal Medicine

## 2009-09-18 ENCOUNTER — Encounter (INDEPENDENT_AMBULATORY_CARE_PROVIDER_SITE_OTHER): Payer: Self-pay | Admitting: Internal Medicine

## 2009-09-21 ENCOUNTER — Telehealth: Payer: Self-pay | Admitting: Infectious Diseases

## 2009-09-24 ENCOUNTER — Telehealth (INDEPENDENT_AMBULATORY_CARE_PROVIDER_SITE_OTHER): Payer: Self-pay | Admitting: *Deleted

## 2009-09-26 ENCOUNTER — Ambulatory Visit: Payer: Self-pay | Admitting: Internal Medicine

## 2009-09-26 DIAGNOSIS — R6882 Decreased libido: Secondary | ICD-10-CM

## 2009-09-27 ENCOUNTER — Encounter (INDEPENDENT_AMBULATORY_CARE_PROVIDER_SITE_OTHER): Payer: Self-pay | Admitting: Internal Medicine

## 2009-10-01 ENCOUNTER — Telehealth (INDEPENDENT_AMBULATORY_CARE_PROVIDER_SITE_OTHER): Payer: Self-pay | Admitting: Internal Medicine

## 2009-10-03 ENCOUNTER — Encounter (INDEPENDENT_AMBULATORY_CARE_PROVIDER_SITE_OTHER): Payer: Self-pay | Admitting: Internal Medicine

## 2009-11-01 ENCOUNTER — Telehealth (INDEPENDENT_AMBULATORY_CARE_PROVIDER_SITE_OTHER): Payer: Self-pay | Admitting: Internal Medicine

## 2009-11-01 ENCOUNTER — Encounter (INDEPENDENT_AMBULATORY_CARE_PROVIDER_SITE_OTHER): Payer: Self-pay | Admitting: Internal Medicine

## 2009-11-16 ENCOUNTER — Encounter (INDEPENDENT_AMBULATORY_CARE_PROVIDER_SITE_OTHER): Payer: Self-pay | Admitting: Internal Medicine

## 2009-11-28 ENCOUNTER — Telehealth (INDEPENDENT_AMBULATORY_CARE_PROVIDER_SITE_OTHER): Payer: Self-pay | Admitting: Internal Medicine

## 2009-12-07 ENCOUNTER — Ambulatory Visit: Payer: Self-pay | Admitting: Internal Medicine

## 2009-12-07 LAB — CONVERTED CEMR LAB
Bacteria, UA: NONE SEEN
Bilirubin Urine: NEGATIVE
Casts: NONE SEEN /lpf
Crystals: NONE SEEN
Ketones, ur: NEGATIVE mg/dL
RBC / HPF: NONE SEEN (ref <3)
Specific Gravity, Urine: 1.015 (ref 1.005–1.0)
pH: 5.5 (ref 5.0–8.0)

## 2009-12-13 ENCOUNTER — Encounter: Payer: Self-pay | Admitting: Internal Medicine

## 2009-12-14 ENCOUNTER — Ambulatory Visit: Payer: Self-pay | Admitting: Infectious Diseases

## 2009-12-14 ENCOUNTER — Telehealth (INDEPENDENT_AMBULATORY_CARE_PROVIDER_SITE_OTHER): Payer: Self-pay | Admitting: Internal Medicine

## 2009-12-14 ENCOUNTER — Observation Stay (HOSPITAL_COMMUNITY): Admission: AD | Admit: 2009-12-14 | Discharge: 2009-12-15 | Payer: Self-pay | Admitting: Infectious Diseases

## 2009-12-14 ENCOUNTER — Encounter: Payer: Self-pay | Admitting: Internal Medicine

## 2009-12-15 ENCOUNTER — Encounter: Payer: Self-pay | Admitting: Internal Medicine

## 2009-12-18 ENCOUNTER — Encounter (INDEPENDENT_AMBULATORY_CARE_PROVIDER_SITE_OTHER): Payer: Self-pay | Admitting: Internal Medicine

## 2009-12-18 ENCOUNTER — Ambulatory Visit: Payer: Self-pay | Admitting: Internal Medicine

## 2009-12-19 ENCOUNTER — Encounter (INDEPENDENT_AMBULATORY_CARE_PROVIDER_SITE_OTHER): Payer: Self-pay | Admitting: Internal Medicine

## 2009-12-25 ENCOUNTER — Encounter: Payer: Self-pay | Admitting: Internal Medicine

## 2009-12-31 ENCOUNTER — Ambulatory Visit: Payer: Self-pay | Admitting: Internal Medicine

## 2009-12-31 ENCOUNTER — Encounter: Payer: Self-pay | Admitting: Internal Medicine

## 2010-01-15 ENCOUNTER — Encounter: Payer: Self-pay | Admitting: Internal Medicine

## 2010-01-21 ENCOUNTER — Telehealth: Payer: Self-pay | Admitting: Internal Medicine

## 2010-01-28 ENCOUNTER — Telehealth: Payer: Self-pay | Admitting: Internal Medicine

## 2010-01-30 ENCOUNTER — Ambulatory Visit: Payer: Self-pay | Admitting: Internal Medicine

## 2010-01-30 ENCOUNTER — Telehealth: Payer: Self-pay | Admitting: *Deleted

## 2010-01-30 ENCOUNTER — Encounter: Payer: Self-pay | Admitting: Internal Medicine

## 2010-01-30 ENCOUNTER — Inpatient Hospital Stay (HOSPITAL_COMMUNITY): Admission: EM | Admit: 2010-01-30 | Discharge: 2010-01-31 | Payer: Self-pay | Admitting: Internal Medicine

## 2010-01-30 LAB — CONVERTED CEMR LAB
Ketones, urine, test strip: NEGATIVE
Nitrite: NEGATIVE
Urobilinogen, UA: 0.2

## 2010-01-31 ENCOUNTER — Encounter: Payer: Self-pay | Admitting: Internal Medicine

## 2010-02-04 ENCOUNTER — Encounter: Payer: Self-pay | Admitting: Internal Medicine

## 2010-02-11 ENCOUNTER — Ambulatory Visit: Payer: Self-pay | Admitting: Internal Medicine

## 2010-02-13 ENCOUNTER — Encounter: Payer: Self-pay | Admitting: Internal Medicine

## 2010-02-21 ENCOUNTER — Encounter: Payer: Self-pay | Admitting: Internal Medicine

## 2010-03-01 ENCOUNTER — Telehealth: Payer: Self-pay | Admitting: Internal Medicine

## 2010-03-01 ENCOUNTER — Encounter: Payer: Self-pay | Admitting: Internal Medicine

## 2010-04-01 ENCOUNTER — Telehealth: Payer: Self-pay | Admitting: Internal Medicine

## 2010-04-01 ENCOUNTER — Encounter: Payer: Self-pay | Admitting: Internal Medicine

## 2010-04-12 ENCOUNTER — Ambulatory Visit: Payer: Self-pay | Admitting: Internal Medicine

## 2010-04-12 LAB — CONVERTED CEMR LAB
Glucose, Urine, Semiquant: NEGATIVE
Nitrite: NEGATIVE
pH: 5.5

## 2010-04-13 ENCOUNTER — Encounter: Payer: Self-pay | Admitting: Internal Medicine

## 2010-04-15 ENCOUNTER — Telehealth: Payer: Self-pay | Admitting: Internal Medicine

## 2010-04-30 ENCOUNTER — Encounter: Payer: Self-pay | Admitting: Internal Medicine

## 2010-04-30 ENCOUNTER — Telehealth: Payer: Self-pay | Admitting: Internal Medicine

## 2010-05-01 ENCOUNTER — Encounter: Payer: Self-pay | Admitting: Internal Medicine

## 2010-05-02 ENCOUNTER — Telehealth: Payer: Self-pay | Admitting: Internal Medicine

## 2010-05-29 ENCOUNTER — Encounter: Payer: Self-pay | Admitting: Internal Medicine

## 2010-05-29 ENCOUNTER — Telehealth: Payer: Self-pay | Admitting: Internal Medicine

## 2010-06-14 ENCOUNTER — Telehealth: Payer: Self-pay | Admitting: Internal Medicine

## 2010-06-17 ENCOUNTER — Encounter: Payer: Self-pay | Admitting: Internal Medicine

## 2010-06-18 ENCOUNTER — Telehealth: Payer: Self-pay | Admitting: *Deleted

## 2010-06-19 ENCOUNTER — Telehealth: Payer: Self-pay | Admitting: Licensed Clinical Social Worker

## 2010-06-21 ENCOUNTER — Encounter: Payer: Self-pay | Admitting: Internal Medicine

## 2010-06-21 ENCOUNTER — Telehealth: Payer: Self-pay | Admitting: Licensed Clinical Social Worker

## 2010-06-25 ENCOUNTER — Ambulatory Visit: Payer: Self-pay

## 2010-07-02 ENCOUNTER — Telehealth (INDEPENDENT_AMBULATORY_CARE_PROVIDER_SITE_OTHER): Payer: Self-pay | Admitting: *Deleted

## 2010-07-04 ENCOUNTER — Telehealth: Payer: Self-pay | Admitting: Internal Medicine

## 2010-07-08 ENCOUNTER — Encounter: Payer: Self-pay | Admitting: Internal Medicine

## 2010-07-18 ENCOUNTER — Telehealth: Payer: Self-pay | Admitting: Internal Medicine

## 2010-07-24 ENCOUNTER — Telehealth: Payer: Self-pay | Admitting: *Deleted

## 2010-08-02 ENCOUNTER — Ambulatory Visit: Admission: RE | Admit: 2010-08-02 | Discharge: 2010-08-02 | Payer: Self-pay | Source: Home / Self Care

## 2010-08-02 LAB — CONVERTED CEMR LAB
Casts: NONE SEEN /lpf
Nitrite: NEGATIVE
Protein, ur: NEGATIVE mg/dL
Urine Glucose: NEGATIVE mg/dL
pH: 5.5 (ref 5.0–8.0)

## 2010-08-06 NOTE — Progress Notes (Signed)
Summary: refill/gg  due friday  Phone Note Refill Request  on Nov 28, 2009 3:44 PM  Refills Requested: Medication #1:  OXYCODONE HCL 5 MG  TABS Take 1  tablets by mouth three times a day as needed   Last Refilled: 11/01/2009 call when ready [phone]   Method Requested: Pick up at Office Initial call taken by: Merrie Roof RN,  Nov 28, 2009 3:44 PM  Follow-up for Phone Call        Rx written and left with triage Follow-up by: Acey Lav MD,  Nov 30, 2009 10:54 AM    Prescriptions: OXYCODONE HCL 5 MG  TABS (OXYCODONE HCL) Take 1  tablets by mouth three times a day as needed  #90 x 0   Entered and Authorized by:   Acey Lav MD   Signed by:   Paulette Blanch Dam MD on 11/30/2009   Method used:   Print then Give to Patient   RxID:   [id number] OXYCODONE HCL 5 MG  TABS (OXYCODONE HCL) Take 1  tablets by mouth three times a day as needed  #90 x 0   Entered and Authorized by:   Acey Lav MD   Signed by:   Paulette Blanch Dam MD on 11/30/2009   Method used:   Print then Give to Patient   RxID:   [id number]   Appended Document: refill/gg  due friday RX ready for pick-up; pt. called.

## 2010-08-06 NOTE — Miscellaneous (Signed)
Summary: [facility] Health Cert[facility] Plan Of [facility]  [facility]   Imported By: Florinda Marker 01/27/2008 13:17:33  _____________________________________________________________________  External Attachment:    Type:   Image     Comment:   External Document

## 2010-08-06 NOTE — Letter (Signed)
Summary: [facility]  [facility]   Imported By: Florinda Marker 09/12/2009 16:40:46  _____________________________________________________________________  External Attachment:    Type:   Image     Comment:   External Document

## 2010-08-06 NOTE — Letter (Signed)
Summary: [facility]-Certificate of Medical Necessity  [facility]cess[facility]   Imported By: Shon Hough 01/23/2010 12:14:19  _____________________________________________________________________  External Attachment:    Type:   Image     Comment:   External Document

## 2010-08-06 NOTE — Discharge Summary (Signed)
Summary: Hospital Discharge Update    Hospital Discharge Update:  Date of Admission: 01/30/2010 Date of Discharge: 01/31/2010  Brief Summary:  Admitted for PICC placement 2/2 recurrent UTIs requiring IV abx. Also found to have urethral discharge. Was discharged to home with PICC and to complete 10 day course of Ertapanem 1gm IV.  Lab or other results pending at discharge:  Final report - urethral discharge culture and gram stain  Labs needed at follow-up: CBC with differential  Medication list changes:  Added new medication of INVANZ 1 GM SOLR (ERTAPENEM SODIUM) give 1gm IV q24h for 9 days, first dose tonight - Signed Added new medication of COLACE 100 MG CAPS (DOCUSATE SODIUM) take one tablet two times a day for constipation - Signed Rx of COLACE 100 MG CAPS (DOCUSATE SODIUM) take one tablet two times a day for constipation;  #60 x 0;  Signed;  Entered by: Jaci Lazier MD;  Authorized by: Jaci Lazier MD;  Method used: Electronically to [facility] [id number] [place], [place], [place]  [address], Ph: [phone], Fax: [phone] Rx of COLACE 100 MG CAPS (DOCUSATE SODIUM) take one tablet two times a day for constipation;  #60 x 0;  Signed;  Entered by: Jaci Lazier MD;  Authorized by: Jaci Lazier MD;  Method used: Print then Give to Patient Rx of INVANZ 1 GM SOLR (ERTAPENEM SODIUM) give 1gm IV q24h for 9 days, first dose tonight;  #0 x 0;  Signed;  Entered by: Jaci Lazier MD;  Authorized by: Jaci Lazier MD;  Method used: Print then Give to Patient  The medication, problem, and allergy lists have been updated.  Please see the dictated discharge summary for details.  Discharge medications:  OXYCODONE HCL 5 MG  TABS (OXYCODONE HCL) Take 1  tablets by mouth three times a day as needed BACLOFEN 10 MG  TABS (BACLOFEN) Take 1 tablet by mouth three times a day NEURONTIN 600 MG  TABS (GABAPENTIN) Take 1 tablet by mouth three times a day DITROPAN XL 10 MG XR24H-TAB  (OXYBUTYNIN CHLORIDE) Take 1 tablet by mouth two times a day LYRICA 150 MG  CAPS (PREGABALIN) Take 1 capsule by mouth once a day MACROBID 100 MG CAPS (NITROFURANTOIN MONOHYD MACRO) take one table daily. LATEX EXAM GLOVES  MISC (DISPOSABLE GLOVES) One box gloves monthly for one year.  For universal precautions during patient care CIALIS 5 MG TABS (TADALAFIL) Take one pill before sexual activity, use only once a day. You may take up to two pills at one time. INVANZ 1 GM SOLR (ERTAPENEM SODIUM) give 1gm IV q24h for 9 days, first dose tonight COLACE 100 MG CAPS (DOCUSATE SODIUM) take one tablet two times a day for constipation  Other patient instructions:  Pls report any fever to 101/greater, chest pain or trouble breathing to your nearest ER. You will be receiving antibiotic treatment via your PICC line for a total of 9 days starting tonight. Pls make sure to follow up with Dr. Eben Burow on Aug 8 at 4pm. Call if you have any other problems.  Note: Hospital Discharge Medications & Other Instructions handout was printed, one copy for patient and a second copy to be placed in hospital chart.  Prescriptions: INVANZ 1 GM SOLR (ERTAPENEM SODIUM) give 1gm IV q24h for 9 days, first dose tonight  #0 x 0   Entered and Authorized by:   Jaci Lazier MD   Signed by:   Jaci Lazier MD on 01/31/2010   Method  used:   Print then Give to Patient   RxID:   [id number] COLACE 100 MG CAPS (DOCUSATE SODIUM) take one tablet two times a day for constipation  #60 x 0   Entered and Authorized by:   Jaci Lazier MD   Signed by:   Jaci Lazier MD on 01/31/2010   Method used:   Print then Give to Patient   RxID:   [id number] COLACE 100 MG CAPS (DOCUSATE SODIUM) take one tablet two times a day for constipation  #60 x 0   Entered and Authorized by:   Jaci Lazier MD   Signed by:   Jaci Lazier MD on 01/31/2010   Method used:   Electronically to        [facility] [id number] (retail)       [address] [place], [place]  [address]       Ph: [phone]       Fax: [phone]   RxID:   [id number]

## 2010-08-06 NOTE — Progress Notes (Signed)
Summary: refill/gg  Phone Note Refill Request  on March 01, 2010 10:53 AM  Refills Requested: Medication #1:  OXYCODONE HCL 5 MG  TABS Take 1  tablets by mouth three times a day as needed   Dosage confirmed as above?Dosage Confirmed   Brand Name Necessary? No   Supply Requested: 1 month   Last Refilled: 01/30/2010  Method Requested: Pick up at Office Initial call taken by: Merrie Roof RN,  March 01, 2010 10:53 AM  Follow-up for Phone Call        Rx put in Rx box. Follow-up by: Lars Mage MD,  March 01, 2010 1:22 PM  Additional Follow-up for Phone Call Additional follow up Details #1::        Pt informed Rx is ready  Additional Follow-up by: Merrie Roof RN,  March 01, 2010 2:10 PM    Prescriptions: OXYCODONE HCL 5 MG  TABS (OXYCODONE HCL) Take 1  tablets by mouth three times a day as needed  #120 x 0   Entered and Authorized by:   Lars Mage MD   Signed by:   Lars Mage MD on 03/01/2010   Method used:   Print then Give to Patient   RxID:   [id number]

## 2010-08-06 NOTE — Progress Notes (Signed)
Summary: refill/gg  Phone Note Refill Request  on April 01, 2010 9:58 AM  Refills Requested: Medication #1:  OXYCODONE HCL 5 MG  TABS Take 1  tablets by mouth three times a day as needed   Dosage confirmed as above?Dosage Confirmed   Brand Name Necessary? No   Supply Requested: 1 month   Last Refilled: 03/01/2010 call when ready [phone]   Method Requested: Pick up at Office Initial call taken by: Merrie Roof RN,  April 01, 2010 9:59 AM  Follow-up for Phone Call        Rx written Follow-up by: Lars Mage MD,  April 01, 2010 2:10 PM  Additional Follow-up for Phone Call Additional follow up Details #1::        Pt informed rx is ready Additional Follow-up by: Merrie Roof RN,  April 01, 2010 2:12 PM    Prescriptions: OXYCODONE HCL 5 MG  TABS (OXYCODONE HCL) Take 1  tablets by mouth three times a day as needed  #120 x 0   Entered and Authorized by:   Lars Mage MD   Signed by:   Lars Mage MD on 04/01/2010   Method used:   Handwritten   RxID:   [id number]

## 2010-08-06 NOTE — Progress Notes (Signed)
Summary: referral/ hla  Phone Note Call from Patient   Summary of Call: pt's mother calls and requests referral to dermatologist for rash on legs, this is ongoing for "awhile". can you please do this and send it to your nurse to complete or will you need to see him? Initial call taken by: Marin Roberts RN,  May 02, 2010 10:17 AM  Follow-up for Phone Call        Please schedule an appointment with me whenever available next for evaluation of rash.  thank you Follow-up by: Lars Mage MD,  May 03, 2010 3:14 PM  Additional Follow-up for Phone Call Additional follow up Details #1::        attempted to call pt, no answer Additional Follow-up by: Marin Roberts RN,  May 07, 2010 10:37 AM    Additional Follow-up for Phone Call Additional follow up Details #2::    Noted. Follow-up by: Lars Mage MD,  May 07, 2010 12:27 PM

## 2010-08-06 NOTE — Medication Information (Signed)
Summary: OXYCODONE   OXYCODONE   Imported By: Margie Billet 06/03/2010 14:03:18  _____________________________________________________________________  External Attachment:    Type:   Image     Comment:   External Document

## 2010-08-06 NOTE — Progress Notes (Signed)
Summary: gloves request/gp  Phone Note From Other Clinic   Caller: [facility] Case Manager Summary of Call: Received a call from [place] P[place], [facility] program, requesting a rx for "1 box of gloves" x 1 year. Needs to be faxed to [phone]. Initial call taken by: Chinita Pester RN,  September 24, 2009 9:27 AM  Follow-up for Phone Call        I will fax Rx ro [phone]. Follow-up by: Chinita Pester RN,  September 24, 2009 3:23 PM    New/Updated Medications: LATEX EXAM GLOVES  MISC (DISPOSABLE GLOVES) One box gloves monthly for one year.  For universal precautions during patient care Prescriptions: LATEX EXAM GLOVES  MISC (DISPOSABLE GLOVES) One box gloves monthly for one year.  For universal precautions during patient care  #1 box x 11   Entered and Authorized by:   Blanch Media MD   Signed by:   Chinita Pester RN on 09/24/2009   Method used:   Printed then faxed to ...       [facility] [id number] (retail)       [address] [place], [place]  [address]       Ph: [phone]       Fax: [phone]   RxID:   [id number]

## 2010-08-06 NOTE — Miscellaneous (Signed)
Summary: Home Care Report  Home Care Report   Imported By: Shon Hough 05/01/2010 11:01:10  _____________________________________________________________________  External Attachment:    Type:   Image     Comment:   External Document

## 2010-08-06 NOTE — Medication Information (Signed)
Summary: OXYCODONE  OXYCODONE   Imported By: Margie Billet 03/19/2010 11:39:05  _____________________________________________________________________  External Attachment:    Type:   Image     Comment:   External Document

## 2010-08-06 NOTE — Miscellaneous (Signed)
Summary: [facility]  [facility]   Imported By: Shon Hough 03/12/2010 11:40:48  _____________________________________________________________________  External Attachment:    Type:   Image     Comment:   External Document

## 2010-08-06 NOTE — Medication Information (Signed)
Summary: Tax adviser   Imported By: Florinda Marker 09/07/2009 16:59:23  _____________________________________________________________________  External Attachment:    Type:   Image     Comment:   External Document

## 2010-08-06 NOTE — Letter (Signed)
Summary: [facility]   Imported By: Margie Billet 10/03/2009 11:02:34  _____________________________________________________________________  External Attachment:    Type:   Image     Comment:   External Document

## 2010-08-06 NOTE — Progress Notes (Signed)
Summary: refill/gg     Phone Note Refill Request  on October 01, 2009 10:25 AM  Refills Requested: Medication #1:  OXYCODONE HCL 5 MG  TABS Take 1  tablets by mouth three times a day as needed   Dosage confirmed as above?Dosage Confirmed   Brand Name Necessary? No   Supply Requested: 1 month   Last Refilled: 09/07/2009 Call when ready  [phone]    ***Pt wants to get Friday   Method Requested: Pick up at Office Initial call taken by: Merrie Roof RN,  October 01, 2009 10:26 AM  Follow-up for Phone Call        Rx written Follow-up by: Nilda Riggs MD,  October 03, 2009 2:37 PM    Prescriptions: OXYCODONE HCL 5 MG  TABS (OXYCODONE HCL) Take 1  tablets by mouth three times a day as needed  #90 x 0   Entered and Authorized by:   Nilda Riggs MD   Signed by:   Nilda Riggs MD on 10/03/2009   Method used:   Handwritten   RxID:   [id number]
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
## 2010-08-06 NOTE — Progress Notes (Signed)
Summary: med refill/gp  Phone Note Refill Request Message from:  Patient on May 29, 2010 11:55 AM  Refills Requested: Medication #1:  OXYCODONE HCL 5 MG  TABS Take 1  tablets by mouth three times a day as needed   Dosage confirmed as above?Dosage Confirmed   Brand Name Necessary? No   Supply Requested: 1 month   Last Refilled: 05/01/2010 Pt. wants to know if he can pick up Rx today since 11/26 is Saturday.  Thanks   Method Requested: Pick up at Office Initial call taken by: Chinita Pester RN,  May 29, 2010 11:55 AM  Follow-up for Phone Call        Rx written and kept in the sample room.  thanks  Additional Follow-up for Phone Call Additional follow up Details #1::         Rx ready; pt. was called. Additional Follow-up by: Chinita Pester RN,  May 29, 2010 1:50 PM    Prescriptions: OXYCODONE HCL 5 MG  TABS (OXYCODONE HCL) Take 1  tablets by mouth three times a day as needed  #120 x 0   Entered and Authorized by:   Lars Mage MD   Signed by:   Lars Mage MD on 05/29/2010   Method used:   Handwritten   RxID:   [id number]

## 2010-08-06 NOTE — Miscellaneous (Signed)
Summary: [facility]:  Home Health Cert. & Plan Of Care  [facility]:  Home Health Cert. & Plan Of Care   Imported By: Florinda Marker 09/14/2009 14:29:00  _____________________________________________________________________  External Attachment:    Type:   Image     Comment:   External Document

## 2010-08-06 NOTE — Miscellaneous (Signed)
Summary: FL2 questions  Note that letter was received from the [facility]. of Public Health, from [place], [place], about a home visit where Mr. Speiser was found to not be taking the Macrobid 100 mg once daily and was taking Keflex 500 mg three times a day. Called back at number listed [phone]) and left message clarifying that our records indicate that he should be on the macrobid and off the Keflex.

## 2010-08-06 NOTE — Progress Notes (Signed)
Summary: Referral  Phone Note Call from Patient   Caller: Mom Call For: Lars Mage MD Summary of Call: Mother called would like to get a referral to an Orthopeadic referral to have the pt's knees checked out.   [phone].   Wants to use a doctor in [facility].  Will call back with the doctor she would like to have for him. Angelina Ok RN  January 21, 2010 9:38 AM  Initial call taken by: Angelina Ok RN,  January 21, 2010 9:38 AM  Follow-up for Phone Call        I reviewed the chart of Mr Mounts and I am not really sure why his mother wants to see an orthopedics. He has paraplegia since 2006. I do not know the patient very well. I would like to see the patient to establish care and may be understand him better so that I could help authorize this request and future care. Follow-up by: Lars Mage MD,  January 21, 2010 10:32 AM  Additional Follow-up for Phone Call Additional follow up Details #1::        Pt's mother called and was informed that pt will need to come in for an appointment with his PCP Dr. Eben Burow to discuss the referral.  Pt's mother said she will contact the patient to get his avalibilty for an appointment in the Clinics. Additional Follow-up by: Angelina Ok RN,  January 23, 2010 11:48 AM    Additional Follow-up for Phone Call Additional follow up Details #2::    thank you. I will see him as scheduled. Follow-up by: Lars Mage MD,  January 23, 2010 12:03 PM

## 2010-08-06 NOTE — Miscellaneous (Signed)
Summary: [facility]  [facility]   Imported By: Margie Billet 12/06/2009 12:29:58  _____________________________________________________________________  External Attachment:    Type:   Image     Comment:   External Document

## 2010-08-06 NOTE — Miscellaneous (Signed)
Summary: [facility]  [facility]   Imported By: Margie Billet 09/19/2009 12:09:51  _____________________________________________________________________  External Attachment:    Type:   Image     Comment:   External Document
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
## 2010-08-06 NOTE — Medication Information (Signed)
Summary: OXYCODONE  OXYCODONE   Imported By: Margie Billet 10/09/2009 10:41:00  _____________________________________________________________________  External Attachment:    Type:   Image     Comment:   External Document

## 2010-08-06 NOTE — Progress Notes (Signed)
Summary: Refill/gh  Phone Note Refill Request Message from:  Fax from Pharmacy on December 14, 2009 4:16 PM  Refills Requested: Medication #1:  LYRICA 150 MG  CAPS Take 1 capsule by mouth once a day   Brand Name Necessary? No   Supply Requested: 6 months   Last Refilled: 11/15/2009  Method Requested: Electronic Initial call taken by: Angelina Ok RN,  December 14, 2009 4:17 PM  Follow-up for Phone Call        I tried to refill electronically and was unable to do so as the drug is DEA/FDA Schedule V. Can we fax or call this in with 5 refills (from what I read, that is the max for schedule V drugs). Thanks Follow-up by: Nilda Riggs MD,  December 16, 2009 8:58 PM  Additional Follow-up for Phone Call Additional follow up Details #1::        Rx called to pharmacy Additional Follow-up by: Angelina Ok RN,  December 17, 2009 2:22 PM    Prescriptions: LYRICA 150 MG  CAPS (PREGABALIN) Take 1 capsule by mouth once a day  #30 x 5   Entered and Authorized by:   Nilda Riggs MD   Signed by:   Nilda Riggs MD on 12/16/2009   Method used:   Telephoned to ...       [facility] [id number] (retail)       [address] [place], [place]  [address]       Ph: [phone]       Fax: [phone]   RxID:   [id number]

## 2010-08-06 NOTE — Progress Notes (Signed)
Summary: refill/ hla  Phone Note Refill Request Message from:  Patient on September 03, 2009 9:21 AM  Refills Requested: Medication #1:  OXYCODONE HCL 5 MG  TABS Take 1  tablets by mouth three times a day as needed   Dosage confirmed as above?Dosage Confirmed   Brand Name Necessary? No   Supply Requested: 1 month   Last Refilled: 2/14 last visit 2/14, told to come back in 3 months  Initial call taken by: Marin Roberts RN,  September 03, 2009 9:21 AM  Follow-up for Phone Call        Refill approved-nurse to complete/phone in script. Follow-up by: Nilda Riggs MD,  September 03, 2009 8:06 PM    Prescriptions: OXYCODONE HCL 5 MG  TABS (OXYCODONE HCL) Take 1  tablets by mouth three times a day as needed  #90 x 0   Entered and Authorized by:   Nilda Riggs MD   Signed by:   Nilda Riggs MD on 09/03/2009   Method used:   Telephoned to ...       [facility] [id number] (retail)       [address] [place], [place]  [address]       Ph: [phone]       Fax: [phone]   RxID:   [id number]   Appended Document: refill/ hla Note that one script was not printed to the Rx printer and had to be sent again.   Prescriptions: OXYCODONE HCL 5 MG  TABS (OXYCODONE HCL) Take 1  tablets by mouth three times a day as needed  #90 x 0   Entered and Authorized by:   Nilda Riggs MD   Signed by:   Nilda Riggs MD on 09/07/2009   Method used:   Printed then faxed to ...       [facility] [id number] (retail)       [address] [place], [place]  [address]       Ph: [phone]       Fax: [phone]   RxID:   [id number] OXYCODONE HCL 5 MG  TABS (OXYCODONE HCL) Take 1  tablets by mouth three times a day as needed  #90 x 0   Entered and Authorized by:   Nilda Riggs MD   Signed by:   Nilda Riggs MD on 09/07/2009   Method used:   Printed then faxed to ...       [facility] [id number] (retail)       [address] [place], [place]  [address]       Ph: [phone]       Fax: [phone]   RxID:   [id number]

## 2010-08-06 NOTE — Progress Notes (Signed)
Summary: med refill/gp  Phone Note Refill Request Message from:  Fax from Pharmacy on April 15, 2010 10:39 AM  Refills Requested: Medication #1:  TRIAMCINOLONE ACETONIDE 0.1 % CREA apply to affected area as directed by your dermatologist.   Dosage confirmed as above?Dosage Confirmed   Brand Name Necessary? No   Supply Requested: 1 month Request refill on Triamcinolone 0.1% cream  Apply daily as needed for itchy legs. Pt. was called and stated it was originally prescribed by the Dermatologist.   Method Requested: Electronic Initial call taken by: Chinita Pester RN,  April 15, 2010 10:39 AM  Follow-up for Phone Call        Rx sent electronically to [facility] drug store. Follow-up by: Lars Mage MD,  April 15, 2010 1:29 PM    New/Updated Medications: TRIAMCINOLONE ACETONIDE 0.1 % CREA (TRIAMCINOLONE ACETONIDE) apply to affected area as directed by your dermatologist Prescriptions: TRIAMCINOLONE ACETONIDE 0.1 % CREA (TRIAMCINOLONE ACETONIDE) apply to affected area as directed by your dermatologist  #1 x 1   Entered and Authorized by:   Lars Mage MD   Signed by:   Lars Mage MD on 04/15/2010   Method used:   Electronically to        [facility] [id number] (retail)       [address] [place], [place]  [address]       Ph: [phone]       Fax: [phone]   RxID:   [id number]

## 2010-08-06 NOTE — Assessment & Plan Note (Signed)
Summary: NURSE VISIT ONLY PER [facility]  Nurse Visit   Allergies: 1)  ! * [place] Seasoning 2)  ! * Shellfish  Pt stopped by clinic and states he pulled on his PICC line this morning while showering.  He has noted some blood at the site and wanted it checked.  [facility] will see pt tonight at the home. I paged IV team to come and evaulated the site Dr Phillips Odor aware. Merrie Roof RN  December 18, 2009 4:18 PM   IV team called and came down to assess PICC line site. Dressing was changed.  Will scan in progress notes from the RN that changed dressing. [facility] will also check dressing/site tonight Merrie Roof RN  December 18, 2009 5:17 PM

## 2010-08-06 NOTE — Medication Information (Signed)
Summary: [facility]    Imported By: Margie Billet 03/21/2010 13:26:25  _____________________________________________________________________  External Attachment:    Type:   Image     Comment:   External Document
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
## 2010-08-06 NOTE — Discharge Summary (Signed)
Summary: Hospital Discharge Update    Hospital Discharge Update:  Date of Admission: 12/14/2009 Date of Discharge: 12/15/2009  Brief Summary:  UTI, recurrent>> Urine cx shows Klebsiella ESBL sensitive to Gentamicin. Patient admitted for picc line placement and initiation of IV Gent. Home Health arrangements made for IV antibiotics which patient will continue for a total of 10 days (Gentamicin 120 mg once daily).  Other follow-up issues:  UA, Urine culture, bmet.  Evaluation of Stage IV sacral decubitus ulcer.  Medication list changes:  Removed medication of CIPRO 500 MG TABS (CIPROFLOXACIN HCL) Take 1 tablet by mouth two times a day Added new medication of GENTAMICIN SULFATE 10 MG/ML SOLN (GENTAMICIN SULFATE) 120mg  IV daily for 10 days  The medication, problem, and allergy lists have been updated.  Please see the dictated discharge summary for details.  Discharge medications:  OXYCODONE HCL 5 MG  TABS (OXYCODONE HCL) Take 1  tablets by mouth three times a day as needed BACLOFEN 10 MG  TABS (BACLOFEN) Take 1 tablet by mouth three times a day NEURONTIN 600 MG  TABS (GABAPENTIN) Take 1 tablet by mouth three times a day DITROPAN XL 10 MG XR24H-TAB (OXYBUTYNIN CHLORIDE) Take 1 tablet by mouth two times a day LYRICA 150 MG  CAPS (PREGABALIN) Take 1 capsule by mouth once a day MACROBID 100 MG CAPS (NITROFURANTOIN MONOHYD MACRO) take one table daily. LATEX EXAM GLOVES  MISC (DISPOSABLE GLOVES) One box gloves monthly for one year.  For universal precautions during patient care CIALIS 5 MG TABS (TADALAFIL) Take one pill before sexual activity, use only once a day. You may take up to two pills at one time. GENTAMICIN SULFATE 10 MG/ML SOLN (GENTAMICIN SULFATE) 120mg  IV daily for 10 days  Other patient instructions:  Please return to [facility] clinic in 2 weeks. You will be called with details of appointment.  You will receive a total of 10 days of IV antibiotics.   Note:  Hospital Discharge Medications & Other Instructions handout was printed, one copy for patient and a second copy to be placed in hospital chart.

## 2010-08-06 NOTE — Miscellaneous (Signed)
Summary: [facility]  [facility]   Imported By: Shon Hough 01/21/2010 16:33:13  _____________________________________________________________________  External Attachment:    Type:   Image     Comment:   External Document

## 2010-08-06 NOTE — Medication Information (Signed)
Summary: Tax adviser   Imported By: Florinda Marker 07/09/2009 16:27:40  _____________________________________________________________________  External Attachment:    Type:   Image     Comment:   External Document

## 2010-08-06 NOTE — Progress Notes (Signed)
Summary: refill/ hla  Phone Note Refill Request Message from:  Patient on August 07, 2009 11:15 AM  Refills Requested: Medication #1:  OXYCODONE HCL 5 MG  TABS Take 1  tablets by mouth three times a day as needed   Dosage confirmed as above?Dosage Confirmed   Last Refilled: 1/3 Initial call taken by: Marin Roberts RN,  August 07, 2009 11:16 AM  Follow-up for Phone Call        Please have patient's PCP write this. Follow-up by: Margarito Liner MD,  August 07, 2009 11:46 AM  Additional Follow-up for Phone Call Additional follow up Details #1::        Can this be called in or do I have to write a script? I'm on night float, so just let me know where to leave the script if that's the case. Thanks. Additional Follow-up by: Nilda Riggs MD,  August 08, 2009 7:41 PM    Prescriptions: OXYCODONE HCL 5 MG  TABS (OXYCODONE HCL) Take 1  tablets by mouth three times a day as needed  #90 x 0   Entered and Authorized by:   Julaine Fusi  DO   Signed by:   Julaine Fusi  DO on 08/09/2009   Method used:   Print then Give to Patient   RxID:   [id number]

## 2010-08-06 NOTE — Letter (Signed)
Summary: PROGRESS NOTES  PROGRESS NOTES   Imported By: Margie Billet 12/20/2009 10:44:46  _____________________________________________________________________  External Attachment:    Type:   Image     Comment:   External Document  Appended Document: PROGRESS NOTES Note from nursing states that RUE PICC line site is without any complications.
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
## 2010-08-06 NOTE — Assessment & Plan Note (Signed)
Summary: Possible uti/cfb   Vital Signs:  Patient profile:   40 year old male Height:      70 inches (177.80 cm) Temp:     98.6 degrees F (37.00 degrees C) oral Pulse rate:   84 / minute BP sitting:   103 / 63  (right arm)  Vitals Entered By: Stanton Kidney Ditzler RN (January 30, 2010 3:21 PM) Is Patient Diabetic? No Pain Assessment Patient in pain? no      Nutritional Status Detail appetite fair  Have you ever been in a relationship where you felt threatened, hurt or afraid?denies   Does patient need assistance? Functional Status Self care Ambulation Wheelchair Comments Girlfriend with pt and assist with care. Needs orthop referral, refills on meds, handicapped sticker and ck urine - dark and cloudy. Constipation.   Primary Care Provider:  Lars Mage MD   History of Present Illness: Pt is a 40 y/o man with PMH/problems as outlined in EMR.  Pt comes to the clinic today with c/o  - Dysuria- Pt complaints of burning urination and cloudy urine since last 10 days.                 He doesn't paid much attention to it during first few days but then when it got worse and he came here today.              Also c/o cloudy urine and some urethral discharge. Also c/o decreased appetite since last 4-5 days.  Denies any fever, abd pain , back pain, N/V.  He was admitted to the hospital for UTI on 12/14/09 and was given 10 days of IV Gentamicin via PICC line as an outpatient. He has paralysis and uses intermittent catheter for urination.  Depression History:      The patient denies a depressed mood most of the day and a diminished interest in his usual daily activities.         Preventive Screening-Counseling & Management  Alcohol-Tobacco     Alcohol drinks/day: 0     Smoking Status: never     Year Quit: 15 years ago  Caffeine-Diet-Exercise     Does Patient Exercise: no  Problems Prior to Update: 1)  Libido, Decreased  (ICD-799.81) 2)  Cough  (ICD-786.2) 3)  Paraplegia   (ICD-344.1) 4)  Neurogenic Bladder  (ICD-596.54) 5)  Pneumonia, Right Middle Lobe  (ICD-486) 6)  Back Pain  (ICD-724.5) 7)  Back Pain, Chronic  (ICD-724.5) 8)  Deep Venous Thrombophlebitis, Leg, Left  (ICD-453.40) 9)  Urinary Tract Infection, Recurrent  (ICD-599.0)  Medications Prior to Update: 1)  Oxycodone Hcl 5 Mg  Tabs (Oxycodone Hcl) .... Take 1  Tablets By Mouth Three Times A Day As Needed 2)  Baclofen 10 Mg  Tabs (Baclofen) .... Take 1 Tablet By Mouth Three Times A Day 3)  Neurontin 600 Mg  Tabs (Gabapentin) .... Take 1 Tablet By Mouth Three Times A Day 4)  Ditropan Xl 10 Mg Xr24h-Tab (Oxybutynin Chloride) .... Take 1 Tablet By Mouth Two Times A Day 5)  Lyrica 150 Mg  Caps (Pregabalin) .... Take 1 Capsule By Mouth Once A Day 6)  Macrobid 100 Mg Caps (Nitrofurantoin Monohyd Macro) .... Take One Table Daily. 7)  Latex Exam Gloves  Misc (Disposable Gloves) .... One Box Gloves Monthly For One Year.  For Universal Precautions During Patient Care 8)  Cialis 5 Mg Tabs (Tadalafil) .... Take One Pill Before Sexual Activity, Use Only Once A Day. You May Take  Up To Two Pills At One Time.  Current Medications (verified): 1)  Oxycodone Hcl 5 Mg  Tabs (Oxycodone Hcl) .... Take 1  Tablets By Mouth Three Times A Day As Needed 2)  Baclofen 10 Mg  Tabs (Baclofen) .... Take 1 Tablet By Mouth Three Times A Day 3)  Neurontin 600 Mg  Tabs (Gabapentin) .... Take 1 Tablet By Mouth Three Times A Day 4)  Ditropan Xl 10 Mg Xr24h-Tab (Oxybutynin Chloride) .... Take 1 Tablet By Mouth Two Times A Day 5)  Lyrica 150 Mg  Caps (Pregabalin) .... Take 1 Capsule By Mouth Once A Day 6)  Macrobid 100 Mg Caps (Nitrofurantoin Monohyd Macro) .... Take One Table Daily. 7)  Latex Exam Gloves  Misc (Disposable Gloves) .... One Box Gloves Monthly For One Year.  For Universal Precautions During Patient Care 8)  Cialis 5 Mg Tabs (Tadalafil) .... Take One Pill Before Sexual Activity, Use Only Once A Day. You May Take Up To Two  Pills At One Time.  Allergies: 1)  ! * [place] Seasoning 2)  ! * Shellfish  Review of Systems       as per HPI.[name]  Physical Exam  General:  alert, well-developed, well-nourished, and well-hydrated.   Head:  normocephalic and atraumatic.   Eyes:  vision grossly intact.  no conjuctivitis Ears:  no external deformities.   Lungs:  normal respiratory effort, normal breath sounds, no crackles, and no wheezes.   Heart:  normal rate, regular rhythm, and no murmur.   Abdomen:  soft, non-tender, and normal bowel sounds.  no suprapubic tenderness. no CVA tenderness  Msk:  lower extremity paralysed. Extremities:  No edema.  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 He is paraplegic and does intermittent catheterization. His last hospital admission for UTI on 12/14/09 showed Klebsiella pneumonia UTI which was ESBL resistant. He was given 10 days of IV Gentamycin by PICC line and got better.  Now he has a recurrent UTI, for which he needs an IV antibiotic Rx and PICC line placed. So needs to be hositalized. Pt to be admitted to Teaching service under Dr. Rogelia Boga. R3 is Dr. Comer Locket. His updated medication list for this problem includes:    Ditropan Xl 10 Mg Xr24h-tab (Oxybutynin chloride) .[name]... Take 1 tablet by mouth two times a day    Macrobid 100 Mg Caps (Nitrofurantoin monohyd macro) .[name]... Take one table daily.  Complete Medication List: 1)  Oxycodone Hcl 5 Mg Tabs (Oxycodone hcl) .... Take 1  tablets by mouth three times a day as needed 2)  Baclofen 10 Mg Tabs (Baclofen) .... Take 1 tablet by mouth three times a day 3)  Neurontin 600 Mg Tabs (Gabapentin) .... Take 1 tablet by mouth three times a day 4)  Ditropan Xl 10 Mg Xr24h-tab (Oxybutynin chloride) .... Take 1 tablet by mouth two times a day 5)  Lyrica 150 Mg Caps (Pregabalin) .... Take 1  capsule by mouth once a day 6)  Macrobid 100 Mg Caps (Nitrofurantoin monohyd macro) .... Take one table daily. 7)  Latex Exam Gloves Misc (Disposable gloves) .... One box gloves monthly for one year.  for universal precautions during patient care 8)  Cialis 5 Mg Tabs (Tadalafil) .... Take one pill before sexual activity, use only once a day. you may take up to  two pills at one time.  Other Orders: Orthopedic Surgeon Referral (Ortho Surgeon) Prescriptions: OXYCODONE HCL 5 MG  TABS (OXYCODONE HCL) Take 1  tablets by mouth three times a day as needed  #120 x 0   Entered and Authorized by:   Lyn Hollingshead   Signed by:   Lyn Hollingshead on 01/30/2010   Method used:   Print then Give to Patient   RxID:   [id number]   Prevention & Chronic Care Immunizations   Influenza vaccine: Not documented    Tetanus booster: Not documented    Pneumococcal vaccine: Not documented  Other Screening   Smoking status: never  (01/30/2010)  Lipids   Total Cholesterol: Not documented   LDL: Not documented   LDL Direct: Not documented   HDL: Not documented   Triglycerides: Not documented   Laboratory Results   Urine Tests  Date/Time Received: 01/30/10 3:32PM Date/Time Reported: same  Routine Urinalysis   Color: yellow Appearance: Cloudy Glucose: negative   (Normal Range: Negative) Bilirubin: small   (Normal Range: Negative) Ketone: negative   (Normal Range: Negative) Spec. Gravity: >=1.030   (Normal Range: 1.003-1.035) Blood: moderate   (Normal Range: Negative) pH: 5.0   (Normal Range: 5.0-8.0) Protein: 30   (Normal Range: Negative) Urobilinogen: 0.2   (Normal Range: 0-1) Nitrite: negative   (Normal Range: Negative) Leukocyte Esterace: large   (Normal Range: Negative)

## 2010-08-06 NOTE — Letter (Signed)
Summary: [employer] [facility]: CAP/DA Program  [facility]: CAP/DA Program   Imported By: Florinda Marker 10/01/2009 11:38:07  _____________________________________________________________________  External Attachment:    Type:   Image     Comment:   External Document

## 2010-08-06 NOTE — Medication Information (Signed)
Summary: OXYCODONE  OXYCODONE   Imported By: Margie Billet 05/08/2010 15:32:24  _____________________________________________________________________  External Attachment:    Type:   Image     Comment:   External Document

## 2010-08-06 NOTE — Medication Information (Signed)
Summary: OXYCODONE  OXYCODONE   Imported By: Margie Billet 11/02/2009 14:44:38  _____________________________________________________________________  External Attachment:    Type:   Image     Comment:   External Document
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
## 2010-08-06 NOTE — Progress Notes (Signed)
Summary: refill/gg  Phone Note Refill Request  on November 01, 2009 12:15 PM  Refills Requested: Medication #1:  OXYCODONE HCL 5 MG  TABS Take 1  tablets by mouth three times a day as needed   Dosage confirmed as above?Dosage Confirmed   Brand Name Necessary? No   Supply Requested: 1 month   Last Refilled: 10/03/2009 Call when ready  [phone]   Method Requested: Pick up at Office Initial call taken by: Merrie Roof RN,  November 01, 2009 12:15 PM  Follow-up for Phone Call        Script printed. Follow-up by: Nilda Riggs MD,  November 01, 2009 1:50 PM    Prescriptions: OXYCODONE HCL 5 MG  TABS (OXYCODONE HCL) Take 1  tablets by mouth three times a day as needed  #90 x 0   Entered and Authorized by:   Nilda Riggs MD   Signed by:   Nilda Riggs MD on 11/01/2009   Method used:   Print then Give to Patient   RxID:   [id number]

## 2010-08-06 NOTE — Progress Notes (Signed)
Summary: refill/ hla  Phone Note Refill Request Message from:  Patient on January 28, 2010 11:50 AM  Refills Requested: Medication #1:  NEURONTIN 600 MG  TABS Take 1 tablet by mouth three times a day   Dosage confirmed as above?Dosage Confirmed   Brand Name Necessary? No   Supply Requested: 6 months Initial call taken by: Marin Roberts RN,  January 28, 2010 11:51 AM  Follow-up for Phone Call        Refill approved-nurse to complete Follow-up by: Lars Mage MD,  January 28, 2010 1:37 PM    Prescriptions: NEURONTIN 600 MG  TABS (GABAPENTIN) Take 1 tablet by mouth three times a day  #90 Tablet x 5   Entered and Authorized by:   Lars Mage MD   Signed by:   Lars Mage MD on 01/28/2010   Method used:   Electronically to        [facility] [id number] (retail)       [address] [place], [place]  [address]       Ph: [phone]       Fax: [phone]   RxID:   [id number]

## 2010-08-06 NOTE — Consult Note (Signed)
Summary: PROGRESS, NOTES  PROGRESS, NOTES   Imported By: Margie Billet 01/08/2010 11:19:17  _____________________________________________________________________  External Attachment:    Type:   Image     Comment:   External Document
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
## 2010-08-06 NOTE — Consult Note (Signed)
Summary: PROGRESS NOTES  PROGRESS NOTES   Imported By: Margie Billet 03/06/2010 11:39:49  _____________________________________________________________________  External Attachment:    Type:   Image     Comment:   External Document

## 2010-08-06 NOTE — Progress Notes (Signed)
Summary: refill/gg  Phone Note Refill Request  on April 30, 2010 9:13 AM  Refills Requested: Medication #1:  OXYCODONE HCL 5 MG  TABS Take 1  tablets by mouth three times a day as needed   Last Refilled: 04/01/2010 Call when ready [phone]    Method Requested: Pick up at Office Initial call taken by: Merrie Roof RN,  April 30, 2010 9:13 AM  Follow-up for Phone Call       Follow-up by: Blanch Media MD,  May 01, 2010 11:41 AM  Additional Follow-up for Phone Call Additional follow up Details #1::        Pt informed Rx is ready Additional Follow-up by: Merrie Roof RN,  May 01, 2010 11:49 AM    Prescriptions: OXYCODONE HCL 5 MG  TABS (OXYCODONE HCL) Take 1  tablets by mouth three times a day as needed  #120 x 0   Entered and Authorized by:   Blanch Media MD   Signed by:   Blanch Media MD on 05/01/2010   Method used:   Print then Give to Patient   RxID:   [id number]

## 2010-08-06 NOTE — Miscellaneous (Signed)
Summary: [facility]   Imported By: Margie Billet 12/25/2009 15:37:15  _____________________________________________________________________  External Attachment:    Type:   Image     Comment:   External Document  Appended Document: [facility]    Clinical Lists Changes  Observations: Added new observation of PLATELETK/UL: 260 K/uL (12/25/2009 16:28) Added new observation of RDW: 14.4 % (12/25/2009 16:28) Added new observation of MCV: 92.4 fL (12/25/2009 16:28) Added new observation of HCT: 36.9 % (12/25/2009 16:28) Added new observation of HGB: 11.9 g/dL (46/96/2952 84:13) Added new observation of WBC COUNT: 5.2 10*3/microliter (12/25/2009 16:28) Added new observation of CREATININE: 0.73 mg/dL (24/40/1027 25:36) Added new observation of BUN: 12 mg/dL (64/40/3474 25:95)

## 2010-08-06 NOTE — Medication Information (Signed)
Summary: Tax adviser   Imported By: Florinda Marker 08/09/2009 14:16:59  _____________________________________________________________________  External Attachment:    Type:   Image     Comment:   External Document

## 2010-08-06 NOTE — Progress Notes (Signed)
Summary: refill/gg  Phone Note Refill Request  on September 21, 2009 10:41 AM  Refills Requested: Medication #1:  BACLOFEN 10 MG  TABS Take 1 tablet by mouth three times a day Initial call taken by: Merrie Roof RN,  September 21, 2009 10:41 AM  Follow-up for Phone Call       Follow-up by: Clydie Braun MD,  September 21, 2009 12:21 PM    Prescriptions: BACLOFEN 10 MG  TABS (BACLOFEN) Take 1 tablet by mouth three times a day  #90 Tablet x 6   Entered and Authorized by:   Clydie Braun MD   Signed by:   Clydie Braun MD on 09/21/2009   Method used:   Electronically to        [facility] [id number] (retail)       [address] [place], [place]  [address]       Ph: [phone]       Fax: [phone]   RxID:   [id number]

## 2010-08-06 NOTE — Miscellaneous (Signed)
  Unfortunately there are no IM antibiotics that are available, they are all back ordered. Therefore I will have patient come in as a direct admit to have a pic line placed then he will be able to have IV anti-biotics. Pt. was called by triage nurse and plans to come in after school tommorrow.  Appended Document:  Bed control called and will assign room tomorrow by 1400 when pt arrives. Pt informed of reason for admission, PICC line and antibiotics.
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
## 2010-08-06 NOTE — Consult Note (Signed)
Summary: [facility]  [facility]   Imported By: Margie Billet 03/07/2010 10:18:31  _____________________________________________________________________  External Attachment:    Type:   Image     Comment:   External Document

## 2010-08-06 NOTE — Progress Notes (Signed)
Summary: Orthopaedic  Phone Note Call from Patient   Caller: Mom Call For: Joshua Mage MD Summary of Call: Call from pt's mother said that she would like for the pt to see Dr. Lequita Halt at [facility].  Pt is also having problems with his bowels as welland needs something for.Angelina Ok RN  January 30, 2010 2:51 PM  Initial call taken by: Angelina Ok RN,  January 30, 2010 2:51 PM  Follow-up for Phone Call        Referral to orthopedic made yesterday, and pt was hospitalized. Thanks.

## 2010-08-06 NOTE — Progress Notes (Signed)
Summary: Soc. Work  Phone Note Other Incoming   Caller: Geralynn Rile,  CAPS Summary of Call: She needs FL-2 on patient to renew CAPS.  Was not in MD box.   She will drop off and I will help complete it.   Follow-up for Phone Call        [facility] completed and signed by attending, Dr. Jessie Foot.  Left for pickup by Geralynn Rile.   [name]  September 11, 2009 9:21 AM

## 2010-08-06 NOTE — Medication Information (Signed)
Summary: OXYCODONE  OXYCODONE   Imported By: Margie Billet 04/02/2010 11:32:53  _____________________________________________________________________  External Attachment:    Type:   Image     Comment:   External Document

## 2010-08-08 NOTE — Progress Notes (Signed)
Summary: refill/gg  Phone Note Refill Request  on July 18, 2010 9:20 AM  Refills Requested: Medication #1:  BACLOFEN 10 MG  TABS Take 1 tablet by mouth three times a day   Dosage confirmed as above?Dosage Confirmed   Brand Name Necessary? No   Supply Requested: 1 month   Last Refilled: 06/19/2010  Method Requested: Electronic Initial call taken by: Merrie Roof RN,  July 18, 2010 9:20 AM  Follow-up for Phone Call        Refill approved-nurse to complete Follow-up by: Julaine Fusi  DO,  July 19, 2010 12:55 PM    Prescriptions: BACLOFEN 10 MG  TABS (BACLOFEN) Take 1 tablet by mouth three times a day  #90 Tablet x 0   Entered by:   Julaine Fusi  DO   Authorized by:   Lars Mage MD   Signed by:   Julaine Fusi  DO on 07/19/2010   Method used:   Electronically to        [facility] [id number] (retail)       [address] [place], [place]  [address]       Ph: [phone]       Fax: [phone]   RxID:   [id number]
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
## 2010-08-08 NOTE — Medication Information (Signed)
Summary: NURSING VISIT TWICE WEEK  NURSING VISIT TWICE WEEK   Imported By: Margie Billet 06/26/2010 13:43:06  _____________________________________________________________________  External Attachment:    Type:   Image     Comment:   External Document

## 2010-08-08 NOTE — Progress Notes (Signed)
Summary: refills/ hla  Phone Note Refill Request Message from:  Patient on June 18, 2010 9:27 AM  Refills Requested: Medication #1:  LYRICA 150 MG  CAPS Take 1 capsule by mouth once a day   Dosage confirmed as above?Dosage Confirmed  Medication #2:  DITROPAN XL 10 MG XR24H-TAB Take 1 tablet by mouth two times a day   Dosage confirmed as above?Dosage Confirmed last seen 10/7, has appt next week  Initial call taken by: Marin Roberts RN,  June 18, 2010 9:27 AM    Prescriptions: LYRICA 150 MG  CAPS (PREGABALIN) Take 1 capsule by mouth once a day  #30 x 5   Entered by:   Zoila Shutter MD   Authorized by:   Lars Mage MD   Signed by:   Zoila Shutter MD on 06/19/2010   Method used:   Telephoned to ...       [facility] [address] (retail)       [address] [place], [place]  [address]       Ph: [phone]       Fax: [phone]   RxID:   [id number] DITROPAN XL 10 MG XR24H-TAB (OXYBUTYNIN CHLORIDE) Take 1 tablet by mouth two times a day  #60 x 11   Entered by:   Zoila Shutter MD   Authorized by:   Lars Mage MD   Signed by:   Zoila Shutter MD on 06/19/2010   Method used:   Electronically to        [facility] [address] (retail)       [address] [place], [place]  [address]       Ph: [phone]       Fax: [phone]   RxID:   [id number]

## 2010-08-08 NOTE — Progress Notes (Signed)
Summary: Appointment  Phone Note Call from Patient   Caller: Patient Call For: Lars Mage MD Summary of Call: Call form pt's mom.  Said that pt had coughed up blood over the week-end.  No fever.  Refuses to go to the ER. Pt wants an appointment for Friday.  No appointments are available.  Pt was offered an appointment for next week at the earliest.  Pt's mom said that she will get an appointment for next Friday.  Pt is out of school on that day.  Pt will also wait for a call if there is a cancellation for Friday by tomorrow afternoon as he will need to call for transportation.Angelina Ok RN  July 24, 2010 4:28 PM  Initial call taken by: Angelina Ok RN,  July 24, 2010 4:28 PM  Follow-up for Phone Call        Patient will keep appointment on 08/02/2009.   Patient could only come on Friday due to classes and is also requesting a chest x-ray.  No longer coughing  blood but, wheezing per him. Follow-up by: Shon Hough,  July 29, 2010 2:20 PM

## 2010-08-08 NOTE — Progress Notes (Signed)
Summary: refill/gg  Phone Note Refill Request  on July 02, 2010 11:12 AM  Refills Requested: Medication #1:  OXYCODONE HCL 5 MG  TABS Take 1  tablets by mouth three times a day as needed   Dosage confirmed as above?Dosage Confirmed   Brand Name Necessary? No   Supply Requested: 1 month   Last Refilled: 06/01/2010 call when ready @ [phone]   Method Requested: Pick up at Office Initial call taken by: Merrie Roof RN,  July 02, 2010 11:12 AM  Follow-up for Phone Call        Rx written and kept in the RX room. Follow-up by: Lars Mage MD,  July 02, 2010 12:17 PM  Additional Follow-up for Phone Call Additional follow up Details #1::        Pt informed Additional Follow-up by: Merrie Roof RN,  July 02, 2010 5:21 PM    Prescriptions: OXYCODONE HCL 5 MG  TABS (OXYCODONE HCL) Take 1  tablets by mouth three times a day as needed  #120 x 0   Entered and Authorized by:   Lars Mage MD   Signed by:   Lars Mage MD on 07/02/2010   Method used:   Handwritten   RxID:   [id number]

## 2010-08-08 NOTE — Progress Notes (Signed)
Summary: phone/gg  Phone Note Call from Patient   Caller: Daughter Summary of Call: Pt's mom called and is requesting a new bed for pt. She called [facility] and was told the pt's doctor will need write a rx for the bed. He uses a hospital bed with air mattress.  The air mattress was replaced 12/2008 so will not need that. Last hospital bed was in 11/06 per [facility].  Please write Dx code and DOB on Rx   fax to [phone] Initial call taken by: Merrie Roof RN,  July 04, 2010 12:33 PM  Follow-up for Phone Call        Rx written and kept in the Rx room.  Thanks. Follow-up by: Lars Mage MD,  July 08, 2010 3:03 PM  Additional Follow-up for Phone Call Additional follow up Details #1::        Rx faxed to [facility] Additional Follow-up by: Merrie Roof RN,  July 09, 2010 3:07 PM

## 2010-08-08 NOTE — Medication Information (Signed)
Summary: HOSPITAL BED   HOSPITAL BED   Imported By: Margie Billet 07/15/2010 12:04:58  _____________________________________________________________________  External Attachment:    Type:   Image     Comment:   External Document

## 2010-08-08 NOTE — Progress Notes (Signed)
  Phone Note Other Incoming   Summary of Call: Marchelle from [facility] called asking for a skilled nursing order for twice monthly nursing visits for 1 year.  Dx  Recurrent UTI's.    Told her I would get attending to complete order.     Follow-up for Phone Call        Dr. Josem Kaufmann signed order and faxed to [phone] att Marchelle today.  [name]  June 21, 2010 12:06 PM

## 2010-08-08 NOTE — Progress Notes (Signed)
Summary: refill/gg  Phone Note Refill Request  on June 14, 2010 4:37 PM  Refills Requested: Medication #1:  LYRICA 150 MG  CAPS Take 1 capsule by mouth once a day   Dosage confirmed as above?Dosage Confirmed   Brand Name Necessary? No   Supply Requested: 1 year   Last Refilled: 05/12/2010  Method Requested: Electronic Initial call taken by: Merrie Roof RN,  June 14, 2010 4:37 PM  Follow-up for Phone Call        Rx faxed to pharmacy Follow-up by: Lars Mage MD,  June 15, 2010 10:01 AM    Prescriptions: NEURONTIN 600 MG  TABS (GABAPENTIN) Take 1 tablet by mouth three times a day  #90 Tablet x 11   Entered and Authorized by:   Lars Mage MD   Signed by:   Lars Mage MD on 06/15/2010   Method used:   Faxed to ...       [facility] [address] (retail)       [address] [place], [place]  [address]       Ph: [phone]       Fax: [phone]   RxID:   [id number]

## 2010-08-08 NOTE — Progress Notes (Signed)
Summary: Repair of PWC  Phone Note Outgoing Call   Summary of Call: Call to patient who is having issues with his PWC.  The right armrest has no padding and the left armrest is loose.  His leg rests are bent and off balance.   [facility] in [place] sold him the chair and according to patient said they need a doctor's order for repair of the chair.  Told pt I would call [facility].   Called [facility] to find out what they are needing.   Santina Evans said that they sent paperwork to Dr. Eben Burow on 12/7.  I asked her to resend paperwork to my attention since I don't see it in his box.   Follow-up for Phone Call        I remember signing the paperwork on Monday and giving it back to [place] for Faxing it back. Follow-up by: Lars Mage MD,  June 19, 2010 2:46 PM  Additional Follow-up for Phone Call Additional follow up Details #1::        Paperwork was sent up front on Tue 12/13 and placed in the "completed forms" box.  Front office staff had someone out and nobody was able to check the box.  I was informed today by front office staff that the form has been faxed and will be scanned into EMR.Cynda Familia [facility])  June 20, 2010 3:26 PM

## 2010-08-08 NOTE — Letter (Signed)
Summary: CMN-ORDER  CMN-ORDER   Imported By: Shon Hough 06/20/2010 15:35:16  _____________________________________________________________________  External Attachment:    Type:   Image     Comment:   External Document

## 2010-08-13 ENCOUNTER — Other Ambulatory Visit: Payer: Self-pay | Admitting: *Deleted

## 2010-08-15 NOTE — Telephone Encounter (Signed)
Please fill this asap, pt has called

## 2010-08-16 MED ORDER — BACLOFEN 10 MG PO TABS: 10.0000 mg | ORAL_TABLET | Freq: Three times a day (TID) | ORAL | Status: DC

## 2010-08-16 NOTE — Telephone Encounter (Signed)
Dr. Eben Burow, Would you consider refilling this for 6 months at a time? Thanks.

## 2010-09-02 ENCOUNTER — Other Ambulatory Visit: Payer: Self-pay | Admitting: *Deleted

## 2010-09-03 ENCOUNTER — Other Ambulatory Visit: Payer: Self-pay | Admitting: Internal Medicine

## 2010-09-03 MED ORDER — OXYCODONE HCL 5 MG PO TABS: 5.0000 mg | ORAL_TABLET | ORAL | Status: DC | PRN

## 2010-09-03 MED ORDER — OXYCODONE HCL 5 MG PO TABS: 5.0000 mg | ORAL_TABLET | Freq: Four times a day (QID) | ORAL | Status: DC | PRN

## 2010-09-03 NOTE — Telephone Encounter (Signed)
Last refill 1/27

## 2010-09-12 ENCOUNTER — Other Ambulatory Visit: Payer: Self-pay | Admitting: Internal Medicine

## 2010-09-17 ENCOUNTER — Encounter: Payer: Self-pay | Admitting: Internal Medicine

## 2010-09-21 LAB — CBC
HCT: 37.8 % — ABNORMAL LOW (ref 39.0–52.0)
Hemoglobin: 12.8 g/dL — ABNORMAL LOW (ref 13.0–17.0)
MCH: 31.3 pg (ref 26.0–34.0)
MCHC: 33.9 g/dL (ref 30.0–36.0)
RBC: 4.09 MIL/uL — ABNORMAL LOW (ref 4.22–5.81)

## 2010-09-21 LAB — URINALYSIS, MICROSCOPIC ONLY
Glucose, UA: NEGATIVE mg/dL
Protein, ur: 30 mg/dL — AB
Specific Gravity, Urine: 1.027 (ref 1.005–1.030)
pH: 5.5 (ref 5.0–8.0)

## 2010-09-21 LAB — URINE CULTURE: Special Requests: NEGATIVE

## 2010-09-21 LAB — COMPREHENSIVE METABOLIC PANEL
ALT: 11 U/L (ref 0–53)
AST: 17 U/L (ref 0–37)
CO2: 28 mEq/L (ref 19–32)
Chloride: 103 mEq/L (ref 96–112)
GFR calc Af Amer: >60 mL/min (ref >60)
GFR calc non Af Amer: >60 mL/min (ref >60)
Glucose, Bld: 121 mg/dL — ABNORMAL HIGH (ref 70–99)
Sodium: 137 mEq/L (ref 135–145)
Total Bilirubin: 0.9 mg/dL (ref 0.3–1.2)

## 2010-09-21 LAB — WOUND CULTURE: Gram Stain: NONE SEEN

## 2010-09-23 LAB — BASIC METABOLIC PANEL
BUN: 8 mg/dL (ref 6–23)
Calcium: 8.7 mg/dL (ref 8.4–10.5)
Creatinine, Ser: 0.74 mg/dL (ref 0.4–1.5)
GFR calc Af Amer: >60 mL/min (ref >60)
GFR calc non Af Amer: >60 mL/min (ref >60)

## 2010-09-23 LAB — CBC
Platelets: 233 10*3/uL (ref 150–400)
RBC: 3.8 MIL/uL — ABNORMAL LOW (ref 4.22–5.81)
WBC: 5.2 10*3/uL (ref 4.0–10.5)

## 2010-10-01 ENCOUNTER — Telehealth: Payer: Self-pay | Admitting: *Deleted

## 2010-10-01 NOTE — Telephone Encounter (Signed)
Order received from Dr Eben Burow and U/A will be done by [facility]

## 2010-10-01 NOTE — Telephone Encounter (Signed)
Amy from [facility] called in stating pt called her with c/o UTI symptoms.  He has Hx of chronic UTI Will you order a u/a and culture for her to do? Amy's # [phone]

## 2010-10-02 ENCOUNTER — Other Ambulatory Visit: Payer: Self-pay | Admitting: *Deleted

## 2010-10-02 NOTE — Telephone Encounter (Signed)
Last filled 2/28 Pt will get tomorrow/ Thursday Pt # [phone]

## 2010-10-03 MED ORDER — OXYCODONE HCL 5 MG PO TABS: 5.0000 mg | ORAL_TABLET | Freq: Four times a day (QID) | ORAL | Status: DC | PRN

## 2010-10-04 ENCOUNTER — Other Ambulatory Visit: Payer: Self-pay | Admitting: Internal Medicine

## 2010-10-04 MED ORDER — CIPROFLOXACIN HCL 500 MG PO TABS: 500.0000 mg | ORAL_TABLET | Freq: Two times a day (BID) | ORAL | Status: AC

## 2010-10-08 ENCOUNTER — Telehealth: Payer: Self-pay | Admitting: *Deleted

## 2010-10-08 NOTE — Telephone Encounter (Signed)
[facility] calls and states the results of u/a and c&s were faxed to clinic and wants to know about treatment, i don't find that anywhere, she will fax again tomorrow am, Kleibsella (sp) positive, she states last uti required iv abx and PICC line [facility] ph# [phone]

## 2010-10-09 NOTE — Telephone Encounter (Signed)
After reviewing the culture and sensitivity results, ceftriaxone 1gm IV q24hours for next 7 days via peripheral line should be ok for him. Since he does not need to be on it for >2 weeks, I think we do not need picc line to administer.

## 2010-10-09 NOTE — Telephone Encounter (Signed)
Contacted Amy with [facility] [phone] and informed her of the plan and she asked that I contact the pharmacy at [facility] [phone] and give them the order.  Order left with Eunice Blase in the pharmacy.  They will get med to patient today or tomorrow at the latest. Amy is also aware that pt will need IV access and not a picc line since he will only be taking it for 7 days.  Phone call complete.

## 2010-10-09 NOTE — Telephone Encounter (Signed)
Received culture report.   Pt has been taking oral cipro but still having cold and hot spells, pain with catheterization, and states urine looks foamy. Please advise.

## 2010-10-14 ENCOUNTER — Encounter: Payer: Self-pay | Admitting: Internal Medicine

## 2010-10-14 LAB — PROTIME-INR
INR: 1 (ref 0.00–1.49)
Prothrombin Time: 13.5 seconds (ref 11.6–15.2)

## 2010-10-21 ENCOUNTER — Telehealth: Payer: Self-pay | Admitting: *Deleted

## 2010-10-21 NOTE — Telephone Encounter (Signed)
Amy,[facility], calls to say pt called her saying he thought the uti was back... Finished iv abx last Tuesday, urine is cloudy w/ whitish disch. Amy will get u/a today. Is this acceptable? Please advise  Thank you, h.

## 2010-10-21 NOTE — Telephone Encounter (Signed)
Yes this is acceptable

## 2010-10-23 ENCOUNTER — Encounter: Payer: Self-pay | Admitting: Internal Medicine

## 2010-10-30 ENCOUNTER — Telehealth: Payer: Self-pay | Admitting: *Deleted

## 2010-10-30 NOTE — Telephone Encounter (Signed)
Pt calls and states wheelchair co informs him that md must provide an order in order to have 2 screws properly inserted into chair and to have the handle that he drives with repaired, he seems to be having several issues w/ the chair and has difficulty getting it repaired, possibly a new chair would be good and from a different company?/ just a thought

## 2010-11-01 ENCOUNTER — Other Ambulatory Visit: Payer: Self-pay | Admitting: *Deleted

## 2010-11-01 ENCOUNTER — Other Ambulatory Visit: Payer: Self-pay | Admitting: Internal Medicine

## 2010-11-01 MED ORDER — OXYCODONE HCL 5 MG PO TABS: 5.0000 mg | ORAL_TABLET | Freq: Four times a day (QID) | ORAL | Status: DC | PRN

## 2010-11-05 ENCOUNTER — Other Ambulatory Visit: Payer: Self-pay | Admitting: Internal Medicine

## 2010-11-05 DIAGNOSIS — N529 Male erectile dysfunction, unspecified: Secondary | ICD-10-CM

## 2010-11-05 MED ORDER — TADALAFIL 5 MG PO TABS: 5.0000 mg | ORAL_TABLET | Freq: Every day | ORAL | Status: DC | PRN

## 2010-11-05 NOTE — Telephone Encounter (Signed)
Please put the request for a new chair and I will be happy to sign. Or we can fax in a prescription for new wheel chair. Please suggest.

## 2010-11-11 ENCOUNTER — Telehealth: Payer: Self-pay | Admitting: Licensed Clinical Social Worker

## 2010-11-11 NOTE — Telephone Encounter (Signed)
Call to mark Joshua Park at [facility] to see if patient could get new PWC and what is age requirement.

## 2010-11-11 NOTE — Telephone Encounter (Signed)
i spoke w/ Joshua Park. And pt, he is making an appt for wh/ch eval. Will go from there.

## 2010-11-12 ENCOUNTER — Other Ambulatory Visit: Payer: Self-pay | Admitting: Licensed Clinical Social Worker

## 2010-11-12 ENCOUNTER — Telehealth: Payer: Self-pay | Admitting: *Deleted

## 2010-11-12 DIAGNOSIS — G8220 Paraplegia, unspecified: Secondary | ICD-10-CM

## 2010-11-12 NOTE — Telephone Encounter (Signed)
We can send for urine analysis and culture if need be. thanks

## 2010-11-12 NOTE — Telephone Encounter (Signed)
See new telephone note created by Social Work

## 2010-11-12 NOTE — Telephone Encounter (Signed)
Joshua Park called back and said age requirement was 5 years.    Called Joshua Park who referred me to the [facility] company at [phone].  Spoke with Joshua Park who said Medicaid was in the process of authorizing repairs and had 45 days from May 1st to do so.  She confirmed that the PWC was purchased in Feb. 2008 and so the chair is not yet five years old.  Will call Joshua Park at [facility] to see if there is anything else we can do.  He is Medicaid only according to current [employer].

## 2010-11-12 NOTE — Telephone Encounter (Signed)
HHn calls pt c/o of uti since sat, urine cloudy, has white sediment, may we send for ua and c&S if need be.  Amy [phone]

## 2010-11-12 NOTE — Telephone Encounter (Deleted)
Spoke with Melvenia Beam and she said they were waiting on Medicaid approval for repairs.  They submitted on May 1st and Medicaid has within 45 days to respond.   The PWC was purchased in February 2008 and so he is not eligible until the chair is 40 yrs old which will be next year.

## 2010-11-12 NOTE — Telephone Encounter (Signed)
Thank you Helen 

## 2010-11-13 ENCOUNTER — Telehealth: Payer: Self-pay | Admitting: Internal Medicine

## 2010-11-13 NOTE — Telephone Encounter (Signed)
UA was positive. Sxs were cloudy urine, sediment. No fever was documented in tele encounter. Since just finished IV Abx, and sxs not severe, will wait for the urine cx results.   Can you make certain that cx was sent and that sxs do not include temp, hypotension, N/V. Thanks  Dr Eben Burow - since you know the pt, pls let me know if ABX need started before cx back.

## 2010-11-14 NOTE — Telephone Encounter (Signed)
Routing to Dr. Eben Burow and Myriam Jacobson.   This is the latest regarding patient's wheelchair.  Everytime a repair needs to be made it has to be authorized by Medicaid.  Joshua Park said that a doctor's order was not required that they had submitted repair request to Medicaid and it takes up to 45 days.   The chair is not yet 40 years old and pt does not qualify for new chair yet.   Just in case there is some other avenue I called Aleen Campi at [facility] and he will call me back.  I'll let you know but for now there is not a way to obtain a new chair.   We should tell the patient to call us next year to facilitate a new PWC with [facility].  My bet is he will get a high quality PWC and the service will be much improved.   I will follow-up with patient to advise him once I speak with Loraine Leriche again.

## 2010-11-14 NOTE — Telephone Encounter (Signed)
This is a very frequent complaint and usually I wait for the culture results to start appropriate IV antibiotic as he is resistant to most of the oral antibiotics.

## 2010-11-19 ENCOUNTER — Encounter: Payer: Self-pay | Admitting: Licensed Clinical Social Worker

## 2010-11-19 ENCOUNTER — Telehealth: Payer: Self-pay | Admitting: *Deleted

## 2010-11-19 ENCOUNTER — Other Ambulatory Visit: Payer: Self-pay | Admitting: Internal Medicine

## 2010-11-19 DIAGNOSIS — N390 Urinary tract infection, site not specified: Secondary | ICD-10-CM

## 2010-11-19 MED ORDER — SULFAMETHOXAZOLE-TRIMETHOPRIM 800-160 MG PO TABS: 1.0000 | ORAL_TABLET | Freq: Two times a day (BID) | ORAL | Status: AC

## 2010-11-19 NOTE — Telephone Encounter (Signed)
i have called [facility] for urine culture report
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
## 2010-11-19 NOTE — Telephone Encounter (Signed)
Please scan in the report as soon as available for further action plan. Thank you

## 2010-11-19 NOTE — Telephone Encounter (Signed)
Called [facility] had last 3 urine cultures faxed over, presnted to dr Reche Dixon, he reviewed and ordered abx, called to kerr, called and left message for pt and HHN to call clinic for instructions for abx

## 2010-11-19 NOTE — Progress Notes (Signed)
Faxed all demographic information over to [facility]/Mark Bernette Mayers and Aundra Millet so that Loraine Leriche can put 12/30/10 MD appointment  2:45 PM on his schedule to assist with PWC assessment.   Loraine Leriche said he will help advocate for new PWC on behalf of patient.
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
## 2010-11-28 ENCOUNTER — Other Ambulatory Visit: Payer: Self-pay | Admitting: *Deleted

## 2010-11-28 NOTE — Telephone Encounter (Signed)
Last refill was 4/27, 5/27 is a Sunday and the clinic will be closed Monday. Pt woud like to pick up rx Friday.  Thanks

## 2010-11-29 MED ORDER — OXYCODONE HCL 5 MG PO TABS: 5.0000 mg | ORAL_TABLET | Freq: Four times a day (QID) | ORAL | Status: DC | PRN

## 2010-11-29 NOTE — Telephone Encounter (Signed)
Rx kept in sample room.

## 2010-12-04 ENCOUNTER — Telehealth: Payer: Self-pay | Admitting: *Deleted
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
## 2010-12-27 ENCOUNTER — Other Ambulatory Visit: Payer: Self-pay | Admitting: *Deleted

## 2010-12-27 MED ORDER — OXYCODONE HCL 5 MG PO TABS: 5.0000 mg | ORAL_TABLET | Freq: Four times a day (QID) | ORAL | Status: DC | PRN

## 2010-12-30 ENCOUNTER — Encounter: Payer: Self-pay | Admitting: Internal Medicine

## 2011-01-07 ENCOUNTER — Telehealth: Payer: Self-pay | Admitting: *Deleted

## 2011-01-07 NOTE — Telephone Encounter (Signed)
Pt called and stating he has been on neurontin 600 mg tid and baclofen 10 mg three time a day for past 6 years. Pt has been taking neurontin 600 mg 4 times a day and baclofen also 4 times a day.  He is running out of meds because he has been taking an extra dose a day.  He is asking for you to increase to 4 times a day for both meds.  Please advise or do you want to see pt.

## 2011-01-09 NOTE — Telephone Encounter (Signed)
Pt informed and voices understanding 

## 2011-01-09 NOTE — Telephone Encounter (Signed)
Patient should be seen

## 2011-01-24 ENCOUNTER — Other Ambulatory Visit: Payer: Self-pay | Admitting: *Deleted

## 2011-01-24 MED ORDER — OXYCODONE HCL 5 MG PO TABS: 5.0000 mg | ORAL_TABLET | Freq: Four times a day (QID) | ORAL | Status: DC | PRN

## 2011-01-24 NOTE — Telephone Encounter (Signed)
Pt informed Rx is ready 

## 2011-01-24 NOTE — Telephone Encounter (Signed)
Last filled 6/22  Call when ready @ [phone]

## 2011-01-29 ENCOUNTER — Other Ambulatory Visit: Payer: Self-pay

## 2011-01-29 ENCOUNTER — Telehealth: Payer: Self-pay | Admitting: *Deleted

## 2011-01-29 DIAGNOSIS — N390 Urinary tract infection, site not specified: Secondary | ICD-10-CM

## 2011-01-29 LAB — URINALYSIS, ROUTINE W REFLEX MICROSCOPIC
Bilirubin Urine: NEGATIVE
Nitrite: POSITIVE — AB
Protein, ur: NEGATIVE mg/dL
Urobilinogen, UA: 1 mg/dL (ref 0.0–1.0)

## 2011-01-29 MED ORDER — LEVOFLOXACIN 500 MG PO TABS: 500.0000 mg | ORAL_TABLET | Freq: Every day | ORAL | Status: DC

## 2011-01-29 NOTE — Telephone Encounter (Signed)
Will prescribe 3 days of Levaquin, orders put in as standing for home health collection

## 2011-01-29 NOTE — Telephone Encounter (Signed)
Call from Amy RN with [facility]  # [phone] Pt thinks he has a UTI.  Daughter will bring in specimen to be run.  Nurse gave him a cup and helped get the specimen. Urine does look cloudy. [facility] is having some issues going to home to collect labs, prn.  Will you put an order in?

## 2011-01-30 LAB — URINALYSIS, MICROSCOPIC ONLY: Crystals: NONE SEEN

## 2011-01-30 NOTE — Telephone Encounter (Signed)
Any informed and will call pt

## 2011-02-01 LAB — URINE CULTURE

## 2011-02-03 ENCOUNTER — Telehealth: Payer: Self-pay | Admitting: *Deleted

## 2011-02-03 NOTE — Telephone Encounter (Signed)
Message from pt stated he did not get his antibiotic.  Said that the pharmacy said that it had not been approved by the Clinics.  Call to pt's Nurse Amy.  She had received the message about the order for the Levaquin for the patient.  Unsure as to why pt had also texted her about not getting the medication.  Call to [facility].  Medication needs Prior Authorization.  Prior Authorization was obtained for the Levaquin.  Pharmacy and patient were called and informed of.

## 2011-02-04 ENCOUNTER — Encounter: Payer: Self-pay | Admitting: Internal Medicine

## 2011-02-04 ENCOUNTER — Ambulatory Visit (INDEPENDENT_AMBULATORY_CARE_PROVIDER_SITE_OTHER): Payer: Medicaid Other | Admitting: Internal Medicine

## 2011-02-04 DIAGNOSIS — N390 Urinary tract infection, site not specified: Secondary | ICD-10-CM

## 2011-02-04 DIAGNOSIS — Z993 Dependence on wheelchair: Secondary | ICD-10-CM

## 2011-02-04 DIAGNOSIS — M549 Dorsalgia, unspecified: Secondary | ICD-10-CM

## 2011-02-04 DIAGNOSIS — G8220 Paraplegia, unspecified: Secondary | ICD-10-CM

## 2011-02-04 NOTE — Patient Instructions (Signed)
Urinary Tract Infection (UTI) Infections of the urinary tract can start in several places. A bladder infection (cystitis), a kidney infection (pyelonephritis), and a prostate infection (prostatitis) are different types of urinary tract infections. They usually get better if treated with medicines (antibiotics) that kill germs. Take all the medicine until it is gone. You or your child may feel better in a few days, but TAKE ALL MEDICINE or the infection may not respond and may become more difficult to treat. HOME CARE INSTRUCTIONS  Drink enough water and fluids to keep the urine clear or pale yellow. Cranberry juice is especially recommended, in addition to large amounts of water.   Avoid caffeine, tea, and carbonated beverages. They tend to irritate the bladder.   Alcohol may irritate the prostate.   Only take over-the-counter or prescription medicines for pain, discomfort, or fever as directed by your caregiver.  FINDING OUT THE RESULTS OF YOUR TEST Not all test results are available during your visit. If your or your child's test results are not back during the visit, make an appointment with your caregiver to find out the results. Do not assume everything is normal if you have not heard from your caregiver or the medical facility. It is important for you to follow up on all test results. TO PREVENT FURTHER INFECTIONS:  Empty the bladder often. Avoid holding urine for long periods of time.   After a bowel movement, women should cleanse from front to back. Use each tissue only once.   Empty the bladder before and after sexual intercourse.  SEEK MEDICAL CARE IF:  There is back pain.   You or your child has an oral temperature above 101F .   Your baby is older than 3 months with a rectal temperature of 100.5 F (38.1 C) or higher for more than 1 day.   Your or your child's problems (symptoms) are no better in 3 days. Return sooner if you or your child is getting worse.  SEEK IMMEDIATE  MEDICAL CARE IF:  There is severe back pain or lower abdominal pain.   You or your child develops chills.   You or your child has an oral temperature above 101F, not controlled by medicine.   Your baby is older than 3 months with a rectal temperature of 102 F (38.9 C) or higher.   Your baby is 21 months old or younger with a rectal temperature of 100.4 F (38 C) or higher.   There is nausea or vomiting.   There is continued burning or discomfort with urination.  MAKE SURE YOU:  Understand these instructions.   Will watch this condition.   Will get help right away if you or your child is not doing well or gets worse.  Document Released: 04/02/2005 Document Re-Released: 09/17/2009 [facility] Patient Information 2011 [place], [place].

## 2011-02-04 NOTE — Assessment & Plan Note (Signed)
Patient has been on oxycodone since last 5 years now and is requesting for better pain control as he is using increased tablets of oxycodone. I have agreed to give him 150 tablets basically one extra tablet for breakthrough pain. Would sign a pain contract with him today. Has been updated in [place]

## 2011-02-04 NOTE — Assessment & Plan Note (Signed)
Patient has another episode of infection and would be needing picc line for 14 days of ceftriaxone treatment.

## 2011-02-04 NOTE — Progress Notes (Signed)
  Subjective:    Patient ID: Joshua Park, male    DOB: 08/12/1970, 40 y.o.   MRN: [id number]  HPI Joshua Park is a 40 year old man with past mental history most significant for paraplegia who is here today primarily for a wheelchair evaluation. Patient has had the paraplegia since many years now and has been using his old wheelchair Since last 5 years. Patient does mention that the right side handle of the wheelchair had to be reeval that a few months back but otherwise has not had any other problems. Patient uses wheelchair for his activities of daily living like dressing, moving from room to room and toileting. Patient has no strength in his lower extremities. The patient is in good spirits today and complaining of some cramps in his upper extremities.  Patient has been having increased pain and the the current pain medications are not enough.   Patient is due for a pain medication contract which is to be signed today.  Patient has had complaints of cloudy urine and the most recent urinalysis and microscopic done 2 days ago was suggestive of enterococci infection sensitive to Rocephin. Patient had been given instructions to have advanced care start ceftriaxone for 14 days for the infection.  Review of Systems  Constitutional: Negative for fever, activity change and appetite change.  HENT: Negative for sore throat.   Respiratory: Negative for cough and shortness of breath.   Cardiovascular: Negative for chest pain and leg swelling.  Gastrointestinal: Negative for nausea, abdominal pain, diarrhea, constipation and abdominal distention.  Genitourinary: Negative for frequency, hematuria and difficulty urinating.  Neurological: Negative for dizziness and headaches.  Psychiatric/Behavioral: Negative for suicidal ideas and behavioral problems.       Objective:   Physical Exam  Constitutional: He is oriented to person, place, and time. He appears well-developed and well-nourished.  HENT:    Head: Normocephalic and atraumatic.  Eyes: Conjunctivae and EOM are normal. Pupils are equal, round, and reactive to light. No scleral icterus.  Neck: Normal range of motion. Neck supple. No JVD present. No thyromegaly present.  Cardiovascular: Normal rate, regular rhythm, normal heart sounds and intact distal pulses.  Exam reveals no gallop and no friction rub.   No murmur heard. Pulmonary/Chest: Effort normal and breath sounds normal. No respiratory distress. He has no wheezes. He has no rales.  Abdominal: Soft. Bowel sounds are normal. He exhibits no distension and no mass. There is no tenderness. There is no rebound and no guarding.  Musculoskeletal: Normal range of motion. He exhibits no edema and no tenderness.  Lymphadenopathy:    He has no cervical adenopathy.  Neurological: He is alert and oriented to person, place, and time. He displays no atrophy. A sensory deficit is present. No cranial nerve deficit. He exhibits normal muscle tone. GCS eye subscore is 4. GCS verbal subscore is 5. GCS motor subscore is 6.       The patient has 0/5 strength in his bilateral lower extremities. Patient has about 2/5 motor strength in his upper extremities bilaterally. Patient has frequent contractions of the hands and was in fact having a cramp while I was examining him. Patient is unable to use cane or walker as he has no strength in his lower extremities.  Psychiatric: He has a normal mood and affect. His behavior is normal.          Assessment & Plan:   No problem-specific assessment & plan notes found for this encounter.

## 2011-02-04 NOTE — Assessment & Plan Note (Addendum)
Patient will need an evaluation from physical therapy for powered wheelchair. The patient is unable to use cane or a walker primarily because of the decreased strength in his upper extremities and also since he cannot support his lower extremities at all. Patient won't be able to use a power scooter since it would need constant maneuvering from the upper extremities.   Patient also has been having recurrent urinary tract infections since he catheterizes himself for urination.

## 2011-02-05 ENCOUNTER — Other Ambulatory Visit: Payer: Self-pay | Admitting: Internal Medicine

## 2011-02-05 DIAGNOSIS — N390 Urinary tract infection, site not specified: Secondary | ICD-10-CM

## 2011-02-06 ENCOUNTER — Other Ambulatory Visit: Payer: Self-pay | Admitting: Internal Medicine

## 2011-02-06 ENCOUNTER — Ambulatory Visit (HOSPITAL_COMMUNITY): Admission: RE | Admit: 2011-02-06 | Payer: Medicaid Other | Source: Ambulatory Visit

## 2011-02-06 ENCOUNTER — Telehealth: Payer: Self-pay | Admitting: *Deleted

## 2011-02-06 DIAGNOSIS — N390 Urinary tract infection, site not specified: Secondary | ICD-10-CM

## 2011-02-06 NOTE — Telephone Encounter (Signed)
Pt was scheduled to have picc line placed this morning at [facility].  He missed appt which was rescheduled to tomorrow 8/3, but pt states he is unable to do any morning appointments.  Ferry nxt avail afternoon appt is one week away on 08/09.  [facility] can have pt come in on Tue 8/07 at 2pm for a 2:30pm appt.  Appt time confirmed with patient.  Pt denies any fevers, chills, pain, nausea, vomiting.  Pt states should he have any symptoms related to his UTI, he can always go the ER. He is currently asymptomatic.  Will also inform pt's pcp about delay.Joshua Spittle Cassady8/2/20124:07 PM

## 2011-02-07 NOTE — Telephone Encounter (Signed)
Noted. As long as patient understands that this is urgent and if he feels worse he should come to ER, I am ok with whatever he decided to do.  Thank you

## 2011-02-11 ENCOUNTER — Ambulatory Visit (HOSPITAL_COMMUNITY)
Admission: RE | Admit: 2011-02-11 | Discharge: 2011-02-11 | Disposition: A | Discharge status: Home / Self Care | Payer: Medicaid Other | Source: Ambulatory Visit | Attending: Internal Medicine | Admitting: Internal Medicine

## 2011-02-11 ENCOUNTER — Other Ambulatory Visit: Payer: Self-pay | Admitting: Internal Medicine

## 2011-02-11 DIAGNOSIS — N390 Urinary tract infection, site not specified: Secondary | ICD-10-CM

## 2011-02-12 ENCOUNTER — Ambulatory Visit: Payer: Medicaid Other | Admitting: Physical Therapy

## 2011-02-24 ENCOUNTER — Other Ambulatory Visit: Payer: Self-pay | Admitting: *Deleted

## 2011-02-24 MED ORDER — BACLOFEN 10 MG PO TABS: 10.0000 mg | ORAL_TABLET | Freq: Three times a day (TID) | ORAL | Status: DC

## 2011-02-24 MED ORDER — OXYCODONE HCL 5 MG PO TABS: 5.0000 mg | ORAL_TABLET | ORAL | Status: DC | PRN

## 2011-03-05 ENCOUNTER — Ambulatory Visit: Payer: Medicaid Other | Admitting: Physical Therapy

## 2011-03-27 ENCOUNTER — Telehealth: Payer: Self-pay | Admitting: *Deleted

## 2011-03-27 LAB — CBC
HCT: 33.7 — ABNORMAL LOW
HCT: 35.3 — ABNORMAL LOW
Hemoglobin: 11.4 — ABNORMAL LOW
Hemoglobin: 11.9 — ABNORMAL LOW
Hemoglobin: 12 — ABNORMAL LOW
MCHC: 33.8
MCHC: 34
MCV: 90.9
MCV: 90.9
MCV: 92.7
RBC: 3.64 — ABNORMAL LOW
RBC: 3.76 — ABNORMAL LOW
RBC: 3.89 — ABNORMAL LOW
RBC: 3.95 — ABNORMAL LOW
WBC: 5.2
WBC: 5.4
WBC: 5.4

## 2011-03-27 LAB — BASIC METABOLIC PANEL
CO2: 27
CO2: 27
CO2: 28
Calcium: 8.8
Chloride: 104
Chloride: 105
Creatinine, Ser: 0.74
Creatinine, Ser: 0.81
GFR calc Af Amer: >60
GFR calc Af Amer: >60
GFR calc Af Amer: >60
GFR calc non Af Amer: >60
Potassium: 3.3 — ABNORMAL LOW
Potassium: 3.3 — ABNORMAL LOW
Sodium: 134 — ABNORMAL LOW
Sodium: 138

## 2011-03-27 LAB — PROTIME-INR
INR: 1
INR: 1.1
INR: 1.6 — ABNORMAL HIGH
Prothrombin Time: 14.7
Prothrombin Time: 14.7

## 2011-03-27 LAB — MAGNESIUM: Magnesium: 2.2

## 2011-03-27 NOTE — Telephone Encounter (Signed)
Request refill on Oxycodone IR 5mg  q4h PRN. Last OV appt 02/04/11.

## 2011-03-28 ENCOUNTER — Other Ambulatory Visit: Payer: Self-pay | Admitting: Internal Medicine

## 2011-03-28 MED ORDER — OXYCODONE HCL 5 MG PO TABS: 5.0000 mg | ORAL_TABLET | ORAL | Status: DC | PRN

## 2011-03-31 NOTE — Telephone Encounter (Signed)
Refilled seems to have been done on 9-21?

## 2011-04-07 LAB — URINALYSIS, ROUTINE W REFLEX MICROSCOPIC
Bilirubin Urine: NEGATIVE
Glucose, UA: NEGATIVE
Hgb urine dipstick: NEGATIVE
Ketones, ur: NEGATIVE
Nitrite: NEGATIVE
Protein, ur: NEGATIVE
Specific Gravity, Urine: 1.022
Urobilinogen, UA: 0.2
pH: 7

## 2011-04-07 LAB — POCT I-STAT, CHEM 8
BUN: 8
Calcium, Ion: 1.25
Chloride: 102
Creatinine, Ser: 0.9
Glucose, Bld: 99
HCT: 52
Hemoglobin: 17.7 — ABNORMAL HIGH
Potassium: 4.1
Sodium: 140
TCO2: 30

## 2011-04-07 LAB — URINE MICROSCOPIC-ADD ON

## 2011-04-20 ENCOUNTER — Emergency Department (HOSPITAL_COMMUNITY)
Admission: EM | Admit: 2011-04-20 | Discharge: 2011-04-20 | Disposition: A | Discharge status: Home / Self Care | Payer: Medicaid Other | Attending: Emergency Medicine | Admitting: Emergency Medicine

## 2011-04-20 ENCOUNTER — Emergency Department (HOSPITAL_COMMUNITY): Payer: Medicaid Other

## 2011-04-20 DIAGNOSIS — R0789 Other chest pain: Secondary | ICD-10-CM | POA: Insufficient documentation

## 2011-04-20 DIAGNOSIS — Z79899 Other long term (current) drug therapy: Secondary | ICD-10-CM | POA: Insufficient documentation

## 2011-04-20 DIAGNOSIS — R10816 Epigastric abdominal tenderness: Secondary | ICD-10-CM | POA: Insufficient documentation

## 2011-04-20 DIAGNOSIS — G8220 Paraplegia, unspecified: Secondary | ICD-10-CM | POA: Insufficient documentation

## 2011-04-20 DIAGNOSIS — R0602 Shortness of breath: Secondary | ICD-10-CM | POA: Insufficient documentation

## 2011-04-20 LAB — COMPREHENSIVE METABOLIC PANEL
ALT: 10 U/L (ref 0–53)
AST: 15 U/L (ref 0–37)
Albumin: 3.8 g/dL (ref 3.5–5.2)
Alkaline Phosphatase: 141 U/L — ABNORMAL HIGH (ref 39–117)
BUN: 13 mg/dL (ref 6–23)
Potassium: 3.8 mEq/L (ref 3.5–5.1)
Sodium: 134 mEq/L — ABNORMAL LOW (ref 135–145)
Total Protein: 7.5 g/dL (ref 6.0–8.3)

## 2011-04-20 LAB — POCT I-STAT, CHEM 8
Calcium, Ion: 1.18 mmol/L (ref 1.12–1.32)
HCT: 38 % — ABNORMAL LOW (ref 39.0–52.0)
Sodium: 139 mEq/L (ref 135–145)
TCO2: 24 mmol/L (ref 0–100)

## 2011-04-20 LAB — DIFFERENTIAL
Basophils Absolute: 0 10*3/uL (ref 0.0–0.1)
Basophils Relative: 0 % (ref 0–1)
Eosinophils Absolute: 0.1 10*3/uL (ref 0.0–0.7)
Neutro Abs: 2.2 10*3/uL (ref 1.7–7.7)
Neutrophils Relative %: 49 % (ref 43–77)

## 2011-04-20 LAB — CBC
Platelets: 188 10*3/uL (ref 150–400)
RBC: 4.31 MIL/uL (ref 4.22–5.81)
WBC: 4.5 10*3/uL (ref 4.0–10.5)

## 2011-04-20 LAB — POCT I-STAT TROPONIN I: Troponin i, poc: 0 ng/mL (ref 0.00–0.08)

## 2011-04-20 LAB — LIPASE, BLOOD: Lipase: 20 U/L (ref 11–59)

## 2011-05-01 ENCOUNTER — Telehealth: Payer: Self-pay | Admitting: *Deleted

## 2011-05-01 NOTE — Telephone Encounter (Signed)
Request refill on: Oxycodone 5mg  (Oxy IR)  Take 1 tab by mouth every 4hrs as needed for pain.

## 2011-05-02 ENCOUNTER — Other Ambulatory Visit: Payer: Self-pay | Admitting: Internal Medicine

## 2011-05-02 MED ORDER — OXYCODONE HCL 5 MG PO TABS: 5.0000 mg | ORAL_TABLET | ORAL | Status: DC | PRN

## 2011-05-02 NOTE — Telephone Encounter (Signed)
Rx ready to be picked up ; pt was called. 

## 2011-05-28 ENCOUNTER — Telehealth: Payer: Self-pay | Admitting: *Deleted

## 2011-05-28 MED ORDER — SULFAMETHOXAZOLE-TMP DS 800-160 MG PO TABS: 1.0000 | ORAL_TABLET | Freq: Two times a day (BID) | ORAL | Status: AC

## 2011-05-28 NOTE — Telephone Encounter (Signed)
Call from pt's HHN Amy.  Pt has texted her that he feels he has  A UTI.  Pt frequently has them.  Does not have a fever and usually does not when he has one.  Amy would like to get an order for a Urine collection.  Spoke with Dr. Phillips Odor Urine Culture to be ordered and antibiotic to be sent to pt's pharmacy.  RTC to Amy -order given to get a Urine for Culture. Informed her that an antibiotic to be sent to pt's pharmacy. Angelina Ok, RN 05/28/2011 10:09 AM.

## 2011-05-28 NOTE — Telephone Encounter (Signed)
Called in 7 days of Bactrim, can start after urine cx obtained-will notify if resistance.

## 2011-06-02 ENCOUNTER — Telehealth: Payer: Self-pay | Admitting: *Deleted

## 2011-06-02 NOTE — Telephone Encounter (Signed)
Requests refill on:  Oxycodone 5mg  - take 1 tab q4h as needed.

## 2011-06-03 ENCOUNTER — Other Ambulatory Visit: Payer: Self-pay | Admitting: *Deleted

## 2011-06-03 MED ORDER — OXYCODONE HCL 5 MG PO TABS: 5.0000 mg | ORAL_TABLET | ORAL | Status: DC | PRN

## 2011-06-03 NOTE — Telephone Encounter (Signed)
Pt informed Rx is ready 

## 2011-06-03 NOTE — Telephone Encounter (Signed)
I do not think there is an exception to the pain contract that can be made for him. I will discuss with Dr. Rogelia Boga to discuss about it.  Thank you Daxen Lanum

## 2011-06-03 NOTE — Telephone Encounter (Signed)
Call pt when ready.  Pt has a hard time coming in to get meds.   He is requesting a 3 month supply.

## 2011-06-03 NOTE — Telephone Encounter (Signed)
Last refill 10/26 

## 2011-06-04 NOTE — Telephone Encounter (Signed)
Rx was refilled; see note 11/27.

## 2011-07-10 ENCOUNTER — Other Ambulatory Visit: Payer: Self-pay | Admitting: *Deleted

## 2011-07-11 MED ORDER — OXYCODONE HCL 5 MG PO TABS: 5.0000 mg | ORAL_TABLET | ORAL | Status: DC | PRN

## 2011-07-11 NOTE — Telephone Encounter (Signed)
Pt informed

## 2011-07-30 ENCOUNTER — Other Ambulatory Visit: Payer: Self-pay | Admitting: Internal Medicine

## 2011-08-02 ENCOUNTER — Other Ambulatory Visit: Payer: Self-pay | Admitting: Internal Medicine

## 2011-08-07 ENCOUNTER — Emergency Department (HOSPITAL_COMMUNITY)
Admission: EM | Admit: 2011-08-07 | Discharge: 2011-08-07 | Disposition: A | Discharge status: Home / Self Care | Payer: Medicaid Other | Attending: Emergency Medicine | Admitting: Emergency Medicine

## 2011-08-07 ENCOUNTER — Emergency Department (HOSPITAL_COMMUNITY): Payer: Medicaid Other

## 2011-08-07 ENCOUNTER — Encounter (HOSPITAL_COMMUNITY): Payer: Self-pay | Admitting: *Deleted

## 2011-08-07 DIAGNOSIS — Z79899 Other long term (current) drug therapy: Secondary | ICD-10-CM | POA: Insufficient documentation

## 2011-08-07 DIAGNOSIS — Z86718 Personal history of other venous thrombosis and embolism: Secondary | ICD-10-CM | POA: Insufficient documentation

## 2011-08-07 DIAGNOSIS — R29898 Other symptoms and signs involving the musculoskeletal system: Secondary | ICD-10-CM | POA: Insufficient documentation

## 2011-08-07 DIAGNOSIS — R51 Headache: Secondary | ICD-10-CM | POA: Insufficient documentation

## 2011-08-07 DIAGNOSIS — G8220 Paraplegia, unspecified: Secondary | ICD-10-CM | POA: Insufficient documentation
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
## 2011-08-07 NOTE — ED Notes (Addendum)
EMS-approx 1.5 hrs pt began having headache while doing his daily exercises. 190/110 initial BP, now 150/80. Reports headache 5/10.

## 2011-08-07 NOTE — ED Notes (Signed)
Ptar here to take pt back home

## 2011-08-07 NOTE — ED Provider Notes (Signed)
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 He does not suffer from chronic headaches usually after performing range of motion exercises of his legs today he had the somewhat acute onset over about 5 minutes of a pounding global headache without change in speech vision swallowing or understanding and no new localized weakness or numbness. His headache onset was about one to one and a half hours prior to arrival ~0845.  Medical screening examination/treatment/procedure(s) were conducted as a shared visit with non-physician practitioner(s) and myself.  I personally evaluated the patient during the encounter  I doubt any other [facility] precluding discharge at this time including, but not necessarily limited to the following:SAH, CVA.  Hurman Horn, MD 08/08/11 [phone]

## 2011-08-07 NOTE — ED Notes (Signed)
Pt co headache 5/10 that started when he was doing daily exercise.  Pain is right behind his eyes.  BP 184/109

## 2011-08-08 NOTE — ED Provider Notes (Signed)
Medical screening examination/treatment/procedure(s) were conducted as a shared visit with non-physician practitioner(s) and myself.  I personally evaluated the patient during the encounter  Paisli Silfies M Charla Criscione, MD 08/08/11 1722 

## 2011-08-12 ENCOUNTER — Other Ambulatory Visit: Payer: Self-pay | Admitting: *Deleted

## 2011-08-12 MED ORDER — OXYCODONE HCL 5 MG PO TABS: 5.0000 mg | ORAL_TABLET | Freq: Four times a day (QID) | ORAL | Status: DC | PRN

## 2011-08-12 NOTE — Telephone Encounter (Signed)
I will leave the prescription in sample room.

## 2011-08-12 NOTE — Telephone Encounter (Signed)
Last refill 1/3 per pharmacy # [id number]

## 2011-08-25 ENCOUNTER — Other Ambulatory Visit: Payer: Self-pay | Admitting: Internal Medicine

## 2011-08-27 ENCOUNTER — Other Ambulatory Visit: Payer: Self-pay | Admitting: Internal Medicine

## 2011-09-10 ENCOUNTER — Other Ambulatory Visit: Payer: Self-pay | Admitting: *Deleted

## 2011-09-10 MED ORDER — OXYCODONE HCL 5 MG PO TABS: 5.0000 mg | ORAL_TABLET | Freq: Four times a day (QID) | ORAL | Status: DC | PRN

## 2011-09-10 NOTE — Telephone Encounter (Signed)
Last refill 2/5 Call pt when ready @ [phone]

## 2011-09-10 NOTE — Telephone Encounter (Signed)
Pt informed Rx is ready 

## 2011-09-11 ENCOUNTER — Telehealth: Payer: Self-pay | Admitting: *Deleted

## 2011-09-11 NOTE — Telephone Encounter (Signed)
Call from Amy, RN with [facility]  # [phone] She states pt called her to report UTI symptoms.  Dark urine and cloudy, fever, & chills. RN went to house and collected specimen. She will run and call results to [place]. Will this be okay with you?  Order okay per Dr Eben Burow.

## 2011-09-15 ENCOUNTER — Other Ambulatory Visit: Payer: Self-pay | Admitting: Internal Medicine

## 2011-09-15 ENCOUNTER — Telehealth: Payer: Self-pay | Admitting: Internal Medicine

## 2011-09-15 MED ORDER — TADALAFIL 5 MG PO TABS: 5.0000 mg | ORAL_TABLET | Freq: Every day | ORAL | Status: DC | PRN

## 2011-09-15 MED ORDER — CIPROFLOXACIN HCL 500 MG PO TABS: 500.0000 mg | ORAL_TABLET | Freq: Two times a day (BID) | ORAL | Status: AC

## 2011-09-15 NOTE — Telephone Encounter (Signed)
Results of u/a and sensitivity received and reviewed by Dr Midwife. Pt put on antibiotics and informed by Dr.  Stann Mainland scan results

## 2011-09-15 NOTE — Telephone Encounter (Signed)
Reviewed Urine Cx results and hx. Called Amy - pt self caths several times daily and that was how this sample was collected. Called pt - pain in private area, colour and odor to urine with white flecks. No fevers. These are the sxs that he gets with every UTI. Last UTI summer 2012 - Rocephin 2/2 resistant E coli. Will treat with Cipro 500 BID 10 days.  Requested refill of cialis. On med list. Recently filled for 2 pill.s WIll refill.

## 2011-09-17 NOTE — Telephone Encounter (Signed)
Thank you. I will follow up on culture results.

## 2011-09-19 ENCOUNTER — Telehealth: Payer: Self-pay | Admitting: *Deleted

## 2011-09-19 NOTE — Telephone Encounter (Signed)
Pt calls and states his pain levels are increasing, constant pain all over, wants to be seen. appt given for mon pm at 1445 dr mills

## 2011-09-21 ENCOUNTER — Other Ambulatory Visit: Payer: Self-pay | Admitting: Internal Medicine

## 2011-09-22 ENCOUNTER — Ambulatory Visit (INDEPENDENT_AMBULATORY_CARE_PROVIDER_SITE_OTHER): Payer: Medicaid Other | Admitting: Internal Medicine

## 2011-09-22 ENCOUNTER — Encounter: Payer: Self-pay | Admitting: Internal Medicine

## 2011-09-22 VITALS — BP 103/65 | HR 56 | Temp 96.9°F | Resp 20

## 2011-09-22 DIAGNOSIS — G8220 Paraplegia, unspecified: Secondary | ICD-10-CM

## 2011-09-22 DIAGNOSIS — G8929 Other chronic pain: Secondary | ICD-10-CM | POA: Insufficient documentation

## 2011-09-22 DIAGNOSIS — M79606 Pain in leg, unspecified: Secondary | ICD-10-CM

## 2011-09-22 DIAGNOSIS — L853 Xerosis cutis: Secondary | ICD-10-CM

## 2011-09-22 DIAGNOSIS — M79609 Pain in unspecified limb: Secondary | ICD-10-CM

## 2011-09-22 DIAGNOSIS — R238 Other skin changes: Secondary | ICD-10-CM

## 2011-09-22 MED ORDER — BACLOFEN 10 MG PO TABS: 10.0000 mg | ORAL_TABLET | Freq: Three times a day (TID) | ORAL | Status: DC

## 2011-09-22 MED ORDER — AMMONIUM LACTATE 12 % EX LOTN: TOPICAL_LOTION | CUTANEOUS | Status: DC | PRN

## 2011-09-22 MED ORDER — OXYCODONE HCL 10 MG PO TABS: 10.0000 mg | ORAL_TABLET | Freq: Four times a day (QID) | ORAL | Status: DC | PRN

## 2011-09-22 NOTE — Patient Instructions (Signed)
Schedule follow up appointment with your primary care, Dr. Eben Burow, provider in one month If your pain is not improved within 7-14 days, schedule a followup appointment with Dr. Arvilla Market. We may need to increase the gabapentin or add another medication like Lyrica or amitriptyline for improved control of your nerve pain. AmLactin is a new cream to help the dry skin on your legs. Apply at least once daily. If you develop worsening rash, severe pain, skin redness, fever, shaking chills, or other concerning symptom, call the clinic at [phone] or go to the ER.

## 2011-09-22 NOTE — Assessment & Plan Note (Signed)
There is no evidence on exam to suggest cellulitis/skin and soft tissue infection; additionally bilateral cellulitis is quite rare.  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  Discussed options for pain control, as we will be increasing his narcotic for management of his chronic pain, will not increase gabapentin at this time.  Hopefully, his increased oxycodone will alleviate his lower extremity discomfort.  Patient is advised to return to clinic in 7-14 days if his pain is not improved or if he develops fevers, rigors, rash, or other concerning symptom.  He may ultimately benefit from increased gabapentin dose or addition of an adjunctive medication like gabapentin, cymbalta, or amitriptyline for improved control of his neuropathic pain.  Will f/u on this at his next OV.

## 2011-09-22 NOTE — Progress Notes (Signed)
Patient ID: Joshua Park, male   DOB: 12-02-1970, 41 y.o.   MRN: [id number]   Subjective:  HPI: pt is here today for acute c/o bilateral leg pain.  He describes the pain as a burning sensation "like fire."  States he noticed the pain was worsening over the past few weeks.  He describes the pain as constant and located in the front of his legs from his knees to ankle; states foot is not involved.  He has similar symptoms during a previous staph infection.  He denies fevers or chills.  He denies any alleviating factors.     Review of Systems  Constitutional: Negative for fever, activity change and appetite change.  HENT: Negative for sore throat.   Respiratory: Negative for cough and shortness of breath.   Cardiovascular: Negative for chest pain and leg swelling.  Gastrointestinal: Negative for nausea, abdominal pain, diarrhea, constipation and abdominal distention.  Genitourinary: Negative for frequency, hematuria and difficulty urinating.  Neurological: Negative for dizziness and headaches.  Psychiatric/Behavioral: Negative for suicidal ideas and behavioral problems.       Objective: Vital signs reviewed. Constitutional: He is oriented to person, place, and time. He appears well-developed and well-nourished. No acute distress Head: Normocephalic and atraumatic.  Eyes: Conjunctivae wnl. No scleral icterus.  Cardiovascular: Normal rate, regular rhythm, normal heart sounds and intact distal pulses.  Exam reveals no gallop and no friction rub.   No murmur heard.  Neurological: He is alert and oriented to person, place, and time. He displays no atrophy. A sensory deficit is present. No cranial nerve deficit. He exhibits normal muscle tone. GCS eye subscore is 4. GCS verbal subscore is 5. GCS motor subscore is 6.       The patient has 0/5 strength in his bilateral lower extremities. Patient has about 2/5 motor strength in his upper extremities bilaterally. Patient has frequent contractions of  the hands and was in fact having a cramp while I was examining him. Patient is unable to use cane or walker as he has no strength in his lower extremities.  Psychiatric: He has a normal mood and affect. His behavior is normal.  Ext: warm, no cyanosis, clubbing, or edema.  Dry, cracked, shiny skin noted in bilateral lower extremities.  No erythema, palpable fluctuance, rash, or other abnormal lesions.     Assessment & Plan:

## 2011-09-22 NOTE — Assessment & Plan Note (Addendum)
Patient has been on oxycodone 5 mg tablets 4 times a day prn pain for a number of years and reports diminished response to this medication; he has likely developed tolerance. Discussed risks and benefits of increasing narcotic dose.  Plan is to increase his oxycodone to 10 mg 4 per times a day as needed for pain.  Will assess his response to this in 71mo, sooner if needed.  Pain contract was reviewed and expectations discussed with patient.  He agrees to all terms. A new medication contract was completed with patient today reflecting increased dose.  30 min with 50% face to face time spent counseling patient about his chronic medical issues and formulating/discussing plan for continued management.

## 2011-09-22 NOTE — Assessment & Plan Note (Signed)
Will prescribe ammonium lactate topical lotion for treatment of dry cracked skin.  Will follow up on his response to this at his next OV.

## 2011-09-29 ENCOUNTER — Encounter: Payer: Self-pay | Admitting: *Deleted

## 2011-09-29 ENCOUNTER — Ambulatory Visit (INDEPENDENT_AMBULATORY_CARE_PROVIDER_SITE_OTHER): Payer: Medicaid Other | Admitting: Internal Medicine

## 2011-09-29 VITALS — BP 96/62 | HR 77 | Temp 96.7°F

## 2011-09-29 DIAGNOSIS — G8929 Other chronic pain: Secondary | ICD-10-CM

## 2011-09-29 DIAGNOSIS — N390 Urinary tract infection, site not specified: Secondary | ICD-10-CM

## 2011-09-29 MED ORDER — OXYCODONE HCL 5 MG PO TABS: 5.0000 mg | ORAL_TABLET | Freq: Four times a day (QID) | ORAL | Status: DC | PRN

## 2011-09-29 MED ORDER — OXYCODONE HCL 20 MG PO TB12: 20.0000 mg | ORAL_TABLET | Freq: Two times a day (BID) | ORAL | Status: DC

## 2011-09-29 NOTE — Progress Notes (Signed)
Patient was on oxycodone 5 mg every 4-6 hours as needed for pain control. He was on 120 tabs a month until 02/04/2011 when the the number of pills were increased to 150 tabs a month. It seems like Dr. Arvilla Market had put him on MS contin and oxycodone on 3/18.  Please message me back if there is any confusion.  Thank you  Vickii Volland

## 2011-09-29 NOTE — Patient Instructions (Addendum)
Your pain regimen has changed. Take Oxycontin 20mg  tablets (long-acting oxycodone) for long-acting pain relief.  Take one tablet every 12 hours.  Do not exceed this dose. If you need additional pain relief, take the oxycodone 5mg  tablets as needed for additional pain relief throughout the day. We will check your urine for an infection today. Schedule a follow up appointment in the next 2-4 weeks with Dr. Eben Burow or Dr. Arvilla Market to discuss your new pain regimen and to make sure your pain is well controlled.

## 2011-09-29 NOTE — Progress Notes (Signed)
Subjective:     Patient ID: Joshua Park, male   DOB: 08/15/1970, 41 y.o.   MRN: [id number]  HPI Patient here today to discuss change to his chronic narcotic regimen.  As his last appointment, pt was taking 5mg  IR oxycodone q4-6hrs and wished to increase 10 10mg  q 6hr as 10mg  tabs relieved his pain; regimen changed and new script submitted for 10mg  po q6hr.  When patient arrived at pharmacy to fill prescription, pharmacy denied fill because previous rx for oxycodone 5mg  #150 was filled 2 weeks prior.  When this was discussed with patient, he stated he had been taking 2 tablets regularly every 4-6 hours and was already close to running out of tablets.  Patient then asked to return for visit today to discuss continued pain management.  Please refer to assessment and plan for further details regarding changes to his pain regimen.  Additionally, he is concerned that his recent course of antibiotics did not treat his recurrent UTI.  He believes this because his urine still appears turbid.  He denies any fever, shaking chills, nausea, vomiting, diarrhea, abdominal pain, hematuria, or flank pain.   Review of Systems Review of Systems  Constitutional: Negative for fever, chills, diaphoresis, activity change, appetite change, fatigue and unexpected weight change.  HENT: Negative for hearing loss, congestion and neck stiffness.   Eyes: Negative for photophobia, pain and visual disturbance.  Respiratory: Negative for cough, chest tightness, shortness of breath and wheezing.   Cardiovascular: Negative for chest pain and palpitations.  Gastrointestinal: Negative for abdominal pain, blood in stool and anal bleeding.  Genitourinary: Negative for dysuria, hematuria and difficulty urinating.  Musculoskeletal: Negative for joint swelling.  Neurological: Negative for dizziness, syncope, speech difficulty, weakness, numbness and headaches.      Objective:   Physical Exam Vitals reviewed. GEN: No apparent  distress.  Alert and oriented x 3.  Pleasant, conversant, and cooperative to exam. RESP:  Lungs are clear to ascultation bilaterally with good air movement.  No wheezes, ronchi, or rubs. CARDIOVASCULAR: regular rate, normal rhythm.  Clear S1, S2, no murmurs, gallops, or rubs. ABDOMEN: soft, non-tender, non-distended.  Bowels sounds present in all quadrants and slightly hypoactive.  No palpable masses.  No suprapubic tenderness SKIN: warm and dry with normal turgor.       Assessment/Plan:

## 2011-09-29 NOTE — Progress Notes (Unsigned)
Spoke w/ Joshua Park and dr mills on speaker ph fri, there is confusion on Joshua Park's part of pain meds, he is here to discuss w/ dr mills. Placed in [id number] slot

## 2011-09-30 LAB — PRESCRIPTION ABUSE MONITORING 15P, URINE
Amphetamine/Meth: NEGATIVE ng/mL
Barbiturate Screen, Urine: NEGATIVE ng/mL
Cannabinoid Scrn, Ur: NEGATIVE ng/mL
Cocaine Metabolites: NEGATIVE ng/mL
Creatinine, Urine: 524.73 mg/dL
Fentanyl, Ur: NEGATIVE ng/mL
Meperidine, Ur: NEGATIVE ng/mL
Methadone Screen, Urine: NEGATIVE ng/mL
Tramadol Scrn, Ur: NEGATIVE ng/mL
Zolpidem, Urine: NEGATIVE ng/mL

## 2011-09-30 LAB — URINALYSIS, MICROSCOPIC ONLY
Bacteria, UA: NONE SEEN
Squamous Epithelial / LPF: NONE SEEN

## 2011-09-30 LAB — URINALYSIS, ROUTINE W REFLEX MICROSCOPIC
Hgb urine dipstick: NEGATIVE
Ketones, ur: NEGATIVE mg/dL
Nitrite: NEGATIVE
Protein, ur: NEGATIVE mg/dL
Urobilinogen, UA: 1 mg/dL (ref 0.0–1.0)
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
## 2011-09-30 NOTE — Assessment & Plan Note (Signed)
Patient recently completed course of abx for treatment of recurrent uti.  He is concerned the abx did not work because his urine appears turbid.  Will repeat ua today with culture, though I expect the results may be confounded by bacterial colonization.  As he is currently afebrile, hemodynamically stable, and asymptomatic I will not treat with abx at this time.  Pt advised to rtc or go to the er if he develops any fever, shaking chills, dizziness, syncope, or other concerning symptoms.

## 2011-10-01 LAB — OPIATES/OPIOIDS (LC/MS-MS)
Codeine Urine: NEGATIVE NG/ML
Heroin (6-AM), UR: NEGATIVE NG/ML
Oxycodone, ur: 7900 NG/ML — ABNORMAL HIGH

## 2011-10-10 ENCOUNTER — Other Ambulatory Visit: Payer: Self-pay | Admitting: *Deleted

## 2011-10-10 DIAGNOSIS — G8929 Other chronic pain: Secondary | ICD-10-CM

## 2011-10-10 NOTE — Telephone Encounter (Signed)
Pt called 10:30AM - found both Rx - Dr Rogelia Boga aware. Stanton Kidney Suzzanne Brunkhorst RN 10/10/11 10:50AM

## 2011-10-10 NOTE — Telephone Encounter (Signed)
Needs written Rx for both - pt states Rx were in a drawer - to be filled today 10/10/11 - both Rx are gone.

## 2011-10-16 ENCOUNTER — Telehealth: Payer: Self-pay | Admitting: *Deleted

## 2011-10-16 NOTE — Telephone Encounter (Signed)
Received prior authorization request from pharmacy.  Pt unable to get rx for oxycontin 20 mg tablets until MD completes form which was placed in box for completion.  Once completed, Medicaid will review form and make a decision in 24-72 hours.Joshua Park, Joshua Crean Cassady4/11/201310:02 AM

## 2011-10-16 NOTE — Telephone Encounter (Signed)
I will forward this to his PCP; Dr. Eben Burow may wish to change his medications.

## 2011-10-20 ENCOUNTER — Telehealth: Payer: Self-pay | Admitting: *Deleted

## 2011-10-20 NOTE — Telephone Encounter (Signed)
Completed today 

## 2011-10-20 NOTE — Telephone Encounter (Signed)
I completed all the forms in my box today. He should be able to refill his medications now.

## 2011-10-20 NOTE — Telephone Encounter (Signed)
Checking on housing papers - they are in Dr Sumner Boast box. Flag sent to Dr Eben Burow. Stanton Kidney Cieanna Stormes RN 10/20/11 3:30PM

## 2011-10-28 ENCOUNTER — Other Ambulatory Visit: Payer: Self-pay | Admitting: *Deleted

## 2011-10-29 MED ORDER — TADALAFIL 5 MG PO TABS: 5.0000 mg | ORAL_TABLET | Freq: Every day | ORAL | Status: DC | PRN

## 2011-10-31 ENCOUNTER — Telehealth: Payer: Self-pay | Admitting: *Deleted

## 2011-10-31 NOTE — Telephone Encounter (Signed)
Call from Tiffany with [facility] making [place] aware she is sending a form for [place] to sign for pt's catheter supplies.

## 2011-11-03 ENCOUNTER — Other Ambulatory Visit: Payer: Self-pay | Admitting: *Deleted

## 2011-11-03 MED ORDER — GABAPENTIN 600 MG PO TABS: 600.0000 mg | ORAL_TABLET | Freq: Three times a day (TID) | ORAL | Status: DC

## 2011-11-06 ENCOUNTER — Other Ambulatory Visit: Payer: Self-pay | Admitting: *Deleted

## 2011-11-06 DIAGNOSIS — G8929 Other chronic pain: Secondary | ICD-10-CM

## 2011-11-06 NOTE — Telephone Encounter (Signed)
Last refill 4/5 per [facility] [address] Call when ready @ [phone]  Pt still waiting for [place] approval of oxycontin 20 mg

## 2011-11-07 ENCOUNTER — Other Ambulatory Visit: Payer: Self-pay | Admitting: *Deleted

## 2011-11-07 MED ORDER — OXYCODONE HCL 5 MG PO TABS: 5.0000 mg | ORAL_TABLET | Freq: Four times a day (QID) | ORAL | Status: DC | PRN

## 2011-11-07 NOTE — Telephone Encounter (Signed)
Pt was on #150. Changed to #120 last visit when long acting was Rx. Pt unable to get long acting 2/2 insurance. Incorrectly printed Rx for #120. Printed correct Rx for #150 until he can get long acting.

## 2011-11-07 NOTE — Telephone Encounter (Signed)
Addended by: Blanch Media A on: 11/07/2011 03:18 PM   Modules accepted: Orders

## 2011-11-11 NOTE — Telephone Encounter (Signed)
Completed form faxed to medicaid. May take up to 72hrs for a decision.Kingsley Spittle Cassady5/7/20133:17 PM

## 2011-11-13 NOTE — Telephone Encounter (Signed)
Opened in error

## 2011-11-13 NOTE — Telephone Encounter (Signed)
Incorrect Rx for oxycodone 5 mg # 120 was destroyed. Pt has picked up rx for # 150 oxycodone.

## 2011-11-18 ENCOUNTER — Encounter: Payer: Medicaid Other | Admitting: Internal Medicine

## 2011-11-19 ENCOUNTER — Other Ambulatory Visit: Payer: Self-pay | Admitting: *Deleted

## 2011-11-19 NOTE — Telephone Encounter (Signed)
Last filled 11/10/11

## 2011-11-20 MED ORDER — TADALAFIL 5 MG PO TABS: 5.0000 mg | ORAL_TABLET | Freq: Every day | ORAL | Status: DC | PRN

## 2011-11-21 ENCOUNTER — Encounter: Payer: Medicaid Other | Admitting: Internal Medicine

## 2011-11-25 ENCOUNTER — Encounter: Payer: Medicaid Other | Admitting: Internal Medicine

## 2011-12-04 ENCOUNTER — Ambulatory Visit (INDEPENDENT_AMBULATORY_CARE_PROVIDER_SITE_OTHER): Payer: Medicaid Other | Admitting: Internal Medicine

## 2011-12-04 ENCOUNTER — Encounter: Payer: Self-pay | Admitting: Internal Medicine

## 2011-12-04 VITALS — BP 85/55 | HR 81 | Temp 97.7°F

## 2011-12-04 DIAGNOSIS — G8929 Other chronic pain: Secondary | ICD-10-CM

## 2011-12-04 DIAGNOSIS — N390 Urinary tract infection, site not specified: Secondary | ICD-10-CM

## 2011-12-04 LAB — POCT URINALYSIS DIPSTICK
Blood, UA: NEGATIVE
Protein, UA: 100
Spec Grav, UA: >=1.03
Urobilinogen, UA: 1

## 2011-12-04 MED ORDER — OXYCODONE HCL 15 MG PO TABS: 15.0000 mg | ORAL_TABLET | Freq: Four times a day (QID) | ORAL | Status: DC | PRN

## 2011-12-04 MED ORDER — OXYCODONE HCL 5 MG PO TABS: 15.0000 mg | ORAL_TABLET | Freq: Four times a day (QID) | ORAL | Status: DC | PRN

## 2011-12-04 MED ORDER — NITROFURANTOIN MONOHYD MACRO 100 MG PO CAPS: 100.0000 mg | ORAL_CAPSULE | Freq: Two times a day (BID) | ORAL | Status: DC

## 2011-12-04 MED ORDER — CEFTRIAXONE SODIUM 1 G IJ SOLR
1.0000 g | Freq: Once | INTRAMUSCULAR | Status: AC
  Administered 2011-12-04: 1 g via INTRAMUSCULAR

## 2011-12-04 NOTE — Patient Instructions (Signed)
Please take antibiotic twice a day for 7 days. Please contact the clinic if your develop fevers and chills because you might need to be admitted to the hospital for IV antibiotics. Please take pain medication as directed.
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
## 2011-12-04 NOTE — Assessment & Plan Note (Signed)
UA is positive for nitrites and leukocytes. We'll send urine off for culture. Based on prior urine culture data patient is resistant to fluoroquinolones and Bactrim. Patient is sensitive to nitrofurantoin. I suspect some colonization as patient in and out catheter himself. However based on lethargy, lower than normal blood pressure, and feeling ill I will prescribe nitrofurantoin for the patient. Patient was advised if he was to develop fevers, or chills or worsening symptoms to come to the emergency room as he would need IV antibiotic therapy. Patient also received a Rocephin shot here in the clinic today.
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
## 2011-12-05 LAB — URINALYSIS, ROUTINE W REFLEX MICROSCOPIC
Specific Gravity, Urine: 1.037 — ABNORMAL HIGH (ref 1.005–1.030)
Urobilinogen, UA: 1 mg/dL (ref 0.0–1.0)
pH: 6 (ref 5.0–8.0)

## 2011-12-05 LAB — URINALYSIS, MICROSCOPIC ONLY

## 2011-12-07 LAB — URINE CULTURE: Colony Count: >=100000

## 2011-12-08 ENCOUNTER — Other Ambulatory Visit: Payer: Self-pay | Admitting: *Deleted

## 2011-12-08 NOTE — Telephone Encounter (Signed)
error 

## 2011-12-09 ENCOUNTER — Ambulatory Visit: Payer: Medicaid Other

## 2011-12-09 ENCOUNTER — Telehealth: Payer: Self-pay | Admitting: Internal Medicine

## 2011-12-09 ENCOUNTER — Ambulatory Visit (INDEPENDENT_AMBULATORY_CARE_PROVIDER_SITE_OTHER): Payer: Medicaid Other | Admitting: Internal Medicine

## 2011-12-09 VITALS — BP 104/60 | HR 62 | Temp 97.0°F

## 2011-12-09 DIAGNOSIS — G8929 Other chronic pain: Secondary | ICD-10-CM

## 2011-12-09 DIAGNOSIS — N390 Urinary tract infection, site not specified: Secondary | ICD-10-CM

## 2011-12-09 MED ORDER — CEFTRIAXONE SODIUM 1 G IJ SOLR: 1.0000 g | Freq: Once | INTRAMUSCULAR | Status: DC

## 2011-12-09 MED ORDER — OXYCODONE HCL 7.5 MG PO TABS: 1.0000 | ORAL_TABLET | ORAL | Status: DC | PRN

## 2011-12-09 MED ORDER — CEFTRIAXONE SODIUM 1 G IJ SOLR
1.0000 g | Freq: Once | INTRAMUSCULAR | Status: AC
  Administered 2011-12-09: 1 g via INTRAMUSCULAR
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
## 2011-12-09 NOTE — Progress Notes (Signed)
Addended by: Maura Crandall on: 12/09/2011 03:12 PM   Modules accepted: Orders

## 2011-12-09 NOTE — Assessment & Plan Note (Signed)
After much discussion with the patient and Dr. Rogelia Boga, it was decided to increase his pain medication from 5 mg of oxycodone 150 tablets to 7.5 mg of oxycodone 150 mg tablets.  Patient is not agreeable to such his pain medications to a sustained-release formula. He already has 150 tablets of 15 mg oxycodone given to him by Dr. Baltazar Apo. This prescription should be good for next 2 months. The next prescription of 150 mg tablets of 7.5 mg oxycodone will be due on or after July 30th.   Patient is agreeable to the plan. We will obtain a new pain contract and urinary drug screen today.

## 2011-12-09 NOTE — Assessment & Plan Note (Signed)
Patient's urine culture was reviewed and he is resistant to all of the oral antibiotics. Patient will be getting ceftriaxone 1 g daily for 3 days as suggested by Dr. Baltazar Apo in her note. Patient is due to get first shot today.

## 2011-12-09 NOTE — Patient Instructions (Signed)

## 2011-12-10 ENCOUNTER — Ambulatory Visit: Payer: Medicaid Other

## 2011-12-11 ENCOUNTER — Ambulatory Visit: Payer: Medicaid Other

## 2011-12-11 NOTE — Telephone Encounter (Signed)
Rocephin injection for UTI

## 2011-12-12 ENCOUNTER — Ambulatory Visit: Payer: Medicaid Other

## 2011-12-15 ENCOUNTER — Ambulatory Visit: Payer: Medicaid Other

## 2011-12-15 ENCOUNTER — Telehealth: Payer: Self-pay | Admitting: *Deleted

## 2011-12-15 NOTE — Telephone Encounter (Signed)
Pt called stating he was not able to get a ride into clinic last week for his last 2 injections of rocephin.  He is still c/o burning sensation, cloudy urine and chills.   Do you want him to come into clinic for last 2 injections? Pt # [phone]

## 2011-12-15 NOTE — Telephone Encounter (Signed)
Pr states he will come in today and tomorrow for injections.

## 2011-12-15 NOTE — Telephone Encounter (Signed)
Case discussed with Dr. Josem Kaufmann. Given that patient is still symptomatic (as per conversation with Inocencio Homes) with burning sensation, cloudy urine and chills, he needs to get the remaining 2 doses of his recephin. If patient is unable to come to the clinic we can identify home health agency to do it.  Thank you,  Romina Divirgilio

## 2011-12-17 ENCOUNTER — Ambulatory Visit (INDEPENDENT_AMBULATORY_CARE_PROVIDER_SITE_OTHER): Payer: Medicaid Other | Admitting: *Deleted

## 2011-12-17 DIAGNOSIS — N390 Urinary tract infection, site not specified: Secondary | ICD-10-CM

## 2011-12-17 MED ORDER — CEFTRIAXONE SODIUM 1 G IJ SOLR
1.0000 g | Freq: Once | INTRAMUSCULAR | Status: AC
  Administered 2011-12-17: 1 g via INTRAMUSCULAR

## 2011-12-18 NOTE — Progress Notes (Signed)
Pt has one Rocephin 1 gm injection left in his order of #3,  #2 was received in office yesterday. Rx sent to pharmacy.  Pharmacy called and dose not carry Rocephin. Talked to Dr Josem Kaufmann and pt will need to come to clinic on Monday if still symptomatic for last injection.

## 2011-12-22 ENCOUNTER — Ambulatory Visit (INDEPENDENT_AMBULATORY_CARE_PROVIDER_SITE_OTHER): Payer: Medicaid Other | Admitting: *Deleted

## 2011-12-22 DIAGNOSIS — N390 Urinary tract infection, site not specified: Secondary | ICD-10-CM

## 2011-12-22 MED ORDER — CEFTRIAXONE SODIUM 1 G IJ SOLR
1.0000 g | Freq: Once | INTRAMUSCULAR | Status: AC
  Administered 2011-12-22: 1 g via INTRAMUSCULAR

## 2011-12-31 ENCOUNTER — Encounter: Payer: Medicaid Other | Admitting: Internal Medicine

## 2012-01-06 ENCOUNTER — Encounter: Payer: Medicaid Other | Admitting: Internal Medicine

## 2012-01-07 NOTE — Telephone Encounter (Signed)
Received faxed form dated 11/24/2011 form [employer] med[employer] stating that request for oxycontin 20 mg table has been denied.Joshua Park, Joshua Tomkinson Cassady7/3/201310:06 AM

## 2012-01-28 ENCOUNTER — Other Ambulatory Visit: Payer: Self-pay | Admitting: Internal Medicine

## 2012-02-03 ENCOUNTER — Other Ambulatory Visit: Payer: Self-pay | Admitting: *Deleted

## 2012-02-03 DIAGNOSIS — G8929 Other chronic pain: Secondary | ICD-10-CM

## 2012-02-03 MED ORDER — OXYCODONE HCL 7.5 MG PO TABS: 1.0000 | ORAL_TABLET | ORAL | Status: DC | PRN

## 2012-02-03 NOTE — Telephone Encounter (Signed)
[facility] and [facility] last [facility]y 15 mg on 6/2 Pt was instructed to break the 15 mg tabs in half so they would last 2 months. Please refill.

## 2012-02-04 ENCOUNTER — Other Ambulatory Visit: Payer: Self-pay | Admitting: Internal Medicine

## 2012-02-04 ENCOUNTER — Telehealth: Payer: Self-pay | Admitting: Licensed Clinical Social Worker

## 2012-02-04 ENCOUNTER — Other Ambulatory Visit: Payer: Self-pay | Admitting: *Deleted

## 2012-02-04 DIAGNOSIS — G8929 Other chronic pain: Secondary | ICD-10-CM

## 2012-02-04 MED ORDER — OXYCODONE HCL 15 MG PO TABS: 7.5000 mg | ORAL_TABLET | ORAL | Status: DC | PRN

## 2012-02-04 NOTE — Telephone Encounter (Signed)
Pt called and states pharmacy did not have oxy 7.5 mg.  I called Dr Eben Burow and had him put in exactly what pt is to take. Pt called and will return the Rx that was given to him yesterday and get the new one.

## 2012-02-04 NOTE — Telephone Encounter (Signed)
CSW received call from [name].  Pt states he is having difficulty with his living situation.  Pt states he receives CAP services, which provided [name] with a wooden ramp.  However, pt's current ramp is too steep.  Pt states he had a power w/c which assisted him getting up ramp, but now having difficulty propelling self up ramp and has impended him from going to school.  CSW will explore options and call pt back. CSW placed call to [employer], [employer] can assist with talks with [name] and his landlord, but landlord is not obligated to put in new ramp.  CSW left message requesting return call from [name]'s case worker, [name].  Pt notified.  [name] states he was told there were no funds available to assist with building a new ramp.  CSW will discuss with pt's current CAP worker.  Pt states he has also discussed with [employer] worker who referred him to [employer].

## 2012-02-06 ENCOUNTER — Encounter: Payer: Self-pay | Admitting: Licensed Clinical Social Worker

## 2012-02-06 NOTE — Telephone Encounter (Signed)
CSW returned call to Amedeo Gory, [name]'s CAP worker.  Pt currently lives with his mother in a non-handicap accessible building.  Worker states pt and mother have moved 3 times as of late, all time non-handicap accessible buildings.  Pt received modifications to prior dwellings but has maxed out his lifetime benefit.  Worker has attempt to find contractor to build pt a new ramp, but no volunteers are available.  [employer] will only assist if pt's owns property.  Pt informed [name] landlord sent contractor out for measurements to build ramp.  Case Worker states pt does not want to explore SNF options and wants to remain in independent housing.  CAP will continue to follow pt's case. CSW placed call to [name] and discussed above conversation with CAP worker.  CSW offered to send [name] listing of handicap accessible housing.

## 2012-02-09 ENCOUNTER — Encounter: Payer: Medicaid Other | Admitting: Internal Medicine

## 2012-03-05 ENCOUNTER — Other Ambulatory Visit: Payer: Self-pay | Admitting: *Deleted

## 2012-03-05 MED ORDER — OXYCODONE HCL 15 MG PO TABS
7.5000 mg | ORAL_TABLET | ORAL | Status: DC | PRN
Start: 2012-03-05 — End: 2012-04-05

## 2012-03-10 ENCOUNTER — Telehealth: Payer: Self-pay | Admitting: *Deleted

## 2012-03-10 NOTE — Telephone Encounter (Signed)
Pt called in for appointment for Friday.  He thinks he is getting a UTI with c/o noting sediment in urine.  He denies any fever other Sx. I offer appointment today but he states the only day he can come is Friday. I made the appointment but told him to call back if her develops fever or other symptoms.  He voices understanding.

## 2012-03-12 ENCOUNTER — Other Ambulatory Visit (HOSPITAL_COMMUNITY)
Admission: RE | Admit: 2012-03-12 | Discharge: 2012-03-12 | Disposition: A | Discharge status: Home / Self Care | Payer: Medicaid Other | Source: Ambulatory Visit | Attending: Internal Medicine | Admitting: Internal Medicine

## 2012-03-12 ENCOUNTER — Ambulatory Visit (HOSPITAL_COMMUNITY)
Admission: RE | Admit: 2012-03-12 | Discharge: 2012-03-12 | Disposition: A | Discharge status: Home / Self Care | Payer: Medicaid Other | Source: Ambulatory Visit | Attending: Internal Medicine | Admitting: Internal Medicine

## 2012-03-12 ENCOUNTER — Encounter: Payer: Self-pay | Admitting: Internal Medicine

## 2012-03-12 ENCOUNTER — Ambulatory Visit (INDEPENDENT_AMBULATORY_CARE_PROVIDER_SITE_OTHER): Payer: Medicaid Other | Admitting: Internal Medicine

## 2012-03-12 VITALS — BP 93/55 | HR 74 | Temp 97.4°F

## 2012-03-12 DIAGNOSIS — Z9889 Other specified postprocedural states: Secondary | ICD-10-CM

## 2012-03-12 DIAGNOSIS — G8220 Paraplegia, unspecified: Secondary | ICD-10-CM

## 2012-03-12 DIAGNOSIS — Z113 Encounter for screening for infections with a predominantly sexual mode of transmission: Secondary | ICD-10-CM | POA: Insufficient documentation

## 2012-03-12 DIAGNOSIS — N390 Urinary tract infection, site not specified: Secondary | ICD-10-CM

## 2012-03-12 DIAGNOSIS — R05 Cough: Secondary | ICD-10-CM

## 2012-03-12 DIAGNOSIS — R0602 Shortness of breath: Secondary | ICD-10-CM | POA: Insufficient documentation

## 2012-03-12 DIAGNOSIS — R059 Cough, unspecified: Secondary | ICD-10-CM | POA: Insufficient documentation

## 2012-03-12 DIAGNOSIS — Z95828 Presence of other vascular implants and grafts: Secondary | ICD-10-CM

## 2012-03-12 LAB — POCT URINALYSIS DIPSTICK
Ketones, UA: NEGATIVE
Protein, UA: NEGATIVE
Spec Grav, UA: 1.025
Urobilinogen, UA: 0.2
pH, UA: 5

## 2012-03-12 NOTE — Assessment & Plan Note (Signed)
Patient reports having filter (possibly a green filter) placed at 7 years ago. He states that it was for preventing a clot from a leg traveling to the heart. If it is actually a green filter, it needs to be removed. I will discuss with IR for this possibility.
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
## 2012-03-12 NOTE — Assessment & Plan Note (Signed)
Patient has typical symptoms for urinary tract infection. Because of using in/out catheter due to paraplegia, patient is at high risk of getting recurrent urinary tract infection. His last episode of UTI was in June. Previous urine culture showed that he is resistant to many oral medications. Currently patient doesn't have signs of pyelonephritis. He does not have a fever or chills. We'll get a urine culture and urinalysis first. Since patient noticed some penile discharge, will also get urine GC/Chlamydia probs. I discussed with the Dr. Eben Burow who is patient's PCP. We decided to not start treatment until urine culture and sensitivity comes back.

## 2012-03-12 NOTE — Patient Instructions (Addendum)
1. I will do a urine culture for you. I will let your know the urine test results when it comes back, and then decide for next plan. 2. Please take all medications as prescribed.  3. If you have worsening of your symptoms or new symptoms arise, please call the clinic ([phone]), or go to the ER immediately if symptoms are severe.  You have done great job in taking all your medications. I appreciate it very much. Please continue doing that.

## 2012-03-12 NOTE — Assessment & Plan Note (Signed)
Patient reports having a green filter in placed at 7 years ago.

## 2012-03-12 NOTE — Assessment & Plan Note (Addendum)
Patient has productive cough in the past 2-3 months. Currently he does not have fever, chills, chest pain or shortness of breath. His lung auscultation has no rales, wheezing, rubs or rhonchi. It is unlikely that patient has pneumonia or severe bronchitis. Patient reports having "rattling feeling" in his right chest. Lung auscultation also had rattling sound on the right chest. Etiology is not clear currently. We'll get chest x-ray.

## 2012-03-13 ENCOUNTER — Other Ambulatory Visit: Payer: Self-pay | Admitting: Internal Medicine

## 2012-03-13 DIAGNOSIS — J189 Pneumonia, unspecified organism: Secondary | ICD-10-CM

## 2012-03-13 LAB — URINALYSIS, ROUTINE W REFLEX MICROSCOPIC
Bilirubin Urine: NEGATIVE
Glucose, UA: NEGATIVE mg/dL
pH: 5.5 (ref 5.0–8.0)

## 2012-03-13 LAB — URINALYSIS, MICROSCOPIC ONLY
Bacteria, UA: NONE SEEN
Crystals: NONE SEEN

## 2012-03-13 MED ORDER — LEVOFLOXACIN 500 MG PO TABS: 750.0000 mg | ORAL_TABLET | Freq: Every day | ORAL | Status: AC

## 2012-03-15 ENCOUNTER — Telehealth: Payer: Self-pay | Admitting: *Deleted

## 2012-03-15 ENCOUNTER — Telehealth: Payer: Self-pay | Admitting: Licensed Clinical Social Worker

## 2012-03-15 NOTE — Telephone Encounter (Signed)
[name] was referred to CSW for Home Health referral.  Pt currently receives CAP services.  CSW placed call to pt's CAP case worker.  [name] currently receives [facility] RN through [facility] for cath care.  Pt's wheelchair was scheduled for repair on 03/11/12 and was to receive a new wheelchair in Feb.  However, pt used funds to repair old chair vs obtain new chair.  Pt's CAP worker states pt is aware is funds are limited and pt will need to cut his aid hours to receive add'l services/items.  Pt currently has a specialized shower chair that does not fit into his current bathtub, no funding available to purchase another shower chair for this specific bath tub.  CAP Caseworker aware of pt current needs and is aware of the services available to [name].  CSW will sign off, pt has exhausted funds for add'l services.

## 2012-03-15 NOTE — Telephone Encounter (Signed)
Pt was contacted and instructed on appt, he stated he started the medicine but pharm only gave him 9 pils instead of 14, i called pharm and they state they gave him the 750mg  tablets for nine days. Pt understands and started med 9/8, he will call for problems and an appt is set for 9/20 dr Sherrine Maples at 1315 per chilonb.

## 2012-03-16 ENCOUNTER — Telehealth: Payer: Self-pay | Admitting: Internal Medicine

## 2012-03-16 MED ORDER — SULFAMETHOXAZOLE-TRIMETHOPRIM 800-160 MG PO TABS: 1.0000 | ORAL_TABLET | Freq: Two times a day (BID) | ORAL | Status: AC

## 2012-03-16 NOTE — Telephone Encounter (Signed)
I called Mr. Paschal  And told him that I sent a prescription of Bactrim to his pharmacy. He needed to take this medication for 10 days. He verbally understands and will start taking his medication.   Lorretta Harp, MD PGY2, Internal Medicine Teaching Service Pager: [phone]

## 2012-03-16 NOTE — Addendum Note (Signed)
Addended by: Lorretta Harp on: 03/16/2012 05:13 PM   Modules accepted: Orders
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
## 2012-03-24 ENCOUNTER — Ambulatory Visit: Payer: Medicaid Other | Admitting: Internal Medicine

## 2012-03-26 ENCOUNTER — Ambulatory Visit: Payer: Medicaid Other | Admitting: Internal Medicine

## 2012-03-26 ENCOUNTER — Encounter: Payer: Self-pay | Admitting: Internal Medicine

## 2012-03-26 ENCOUNTER — Ambulatory Visit (INDEPENDENT_AMBULATORY_CARE_PROVIDER_SITE_OTHER): Payer: Medicaid Other | Admitting: Internal Medicine

## 2012-03-26 VITALS — BP 88/56 | HR 61 | Temp 97.4°F

## 2012-03-26 DIAGNOSIS — R05 Cough: Secondary | ICD-10-CM

## 2012-03-26 DIAGNOSIS — R059 Cough, unspecified: Secondary | ICD-10-CM

## 2012-03-26 DIAGNOSIS — N390 Urinary tract infection, site not specified: Secondary | ICD-10-CM

## 2012-03-26 DIAGNOSIS — J189 Pneumonia, unspecified organism: Secondary | ICD-10-CM

## 2012-03-26 LAB — CBC
HCT: 39.2 % (ref 39.0–52.0)
Hemoglobin: 13.3 g/dL (ref 13.0–17.0)
MCHC: 33.9 g/dL (ref 30.0–36.0)
Platelets: 226 10*3/uL (ref 150–400)
WBC: 4.5 10*3/uL (ref 4.0–10.5)
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
## 2012-03-26 NOTE — Assessment & Plan Note (Addendum)
Urine cx from 9/6 positive for ecoli sensitive to bactrim, started on bactrim 800-160mg  BID x10 days.  Presented today for f/u, claims symptoms have resolved, has 2 more days of abx to complete. Unclear of GC/Chlamydia was done on last visit.  Denies dysuria or discharge at this time.  -complete course of abx -continue to monitor given hx of recurrent uti likely secondary to in and out cath as he is paraplegic -f/u with pcp

## 2012-03-26 NOTE — Patient Instructions (Addendum)
Please stay at home in isolation until you are contacted by the health department  If you do not hear from anyone, please call [phone] and ask to speak to Tammy who is in charge of TB nursing  You can also try contacting Meda Coffee: [phone]  The main number for [place] [facility] department is [phone]  If your symptoms worse call clinic and/or go to Emergency Room  Tuberculosis Tuberculosis (TB) is a serious infection. TB often attacks the lungs, but any part of the body can be affected. TB can spread from person to person (contagious). TB is often spread by coughing or sneezing. TB can be treated with medicines. If TB is not treated, it can be life-threatening. HOME CARE  Take your medicines (antibiotics) as told. Finish them even if you start to feel better.   Keep all doctor visits as told. You will have doctor visits for at least 2 years.   Tell your doctor about all the people you live with or have close contact with. They may need to be tested for TB.   Eat a healthy diet.   Rest as needed.   Until your doctor says it is okay:   Avoid close contact with others, especially babies and older people.   Cover your mouth and nose when you cough or sneeze. Throw away used tissues properly.   Wash your hands often with soap and water.   Do not go back to work or school.  GET HELP RIGHT AWAY IF:   You have chest pain.   You cough up blood.   You have trouble breathing or shortness of breath.   You have a headache or stiff neck.   You have a fever.   The patient is older than 3 months with a rectal temperature of 102 F (38.9 C) or higher.   The patient is 54 months old or younger with a rectal temperature of 100.4 F (38 C) or higher.   You have new problems that may be caused by your medicine.   You lose your appetite, feel sick to your stomach (nauseous), or throw up (vomit).   Your pee (urine) is dark yellow.   Your skin or the white part of  your eyes turns yellow.   Your problems do not go away or get worse.   You have a new cough, or your cough lasts longer than 3 to 4 weeks.   You keep losing weight.  MAKE SURE YOU:  Understand these instructions.   Will watch your condition.   Will get help right away if you are not doing well or get worse.  Document Released: 04/20/2009 Document Revised: 06/12/2011 Document Reviewed: 02/20/2011 [facility] Patient Information 2012 [place], [place].  Tuberculosis Tuberculosis (TB) is a serious infection that lasts for years if it is not treated. TB usually attacks the lungs, but almost any part of the body can be affected. TB can be cured with medicines. If TB is not treated completely, it damages the lungs and other parts of the body and may be life-threatening. Caregivers are required by law to report all cases of TB to the Department of Health. The Department of Healthhelps to identify other people who have TB and to protect other people from getting TB. CAUSES  TB is caused by a bacteria called Mycobacterium tuberculosis. It is easily spread from person to person (contagious). This can occur when an infected person coughs or sneezes, releasing tiny droplets into the air. Another person can  then breathe the bacteria into the lungs, causing infection. SYMPTOMS   Cough.   Weight loss.   Fatigue.   Fever.   Sweating.   Chills.   Loss of appetite.  DIAGNOSIS  Your caregiver may perform a skin test. A substance is injected under the skin, and your caregiver will recheck the area in 48 to 72 hours to see how your body reacts. Your caregiver may also use blood tests, chest X-rays, and sputum tests to determine whether you have TB. TREATMENT  TB is treated with antibiotic medicines. You may need to take antibiotics for 6 to 9 months. Antibiotics commonly given for TB include:  Isoniazid.   Rifampin.   Ethambutol.   Pyrazinamide.  HOME CARE INSTRUCTIONS   Take your  antibiotics as directed. Finish them even if you start to feel better.   Keep all follow-up appointments as directed by your caregiver. Regular follow-up visits are required to make sure your medicines are working. Follow-up visits are needed for at least 2 years to make sure the illness remains under control.   Tell your caregiver about all of the people you live with or have close contact with. Your caregiver or the Department of Health will contact these people about also being tested for TB.   Eat a well-balanced diet.   Rest as needed.   Until your caregiver says you are no longer contagious:   Avoid close contact with others, especially babies and elderly people. They are much more likely to catch TB.   Cover your mouth and nose when you cough or sneeze. Dispose of used tissues properly.   Wash your hands frequently with soap and water.   Do not go back to work or school.  SEEK MEDICAL CARE IF:   You have new problems that may be caused by your medicine.   You lose your appetite, feel nauseous, or vomit.   Your urine becomes dark yellow.   Your skin or the white part of your eyes turns a yellowish color.   Your symptoms do not go away or get worse.   You have a new cough, or your cough lasts longer than 3 to 4 weeks.   You keep losing weight.   The patient is a baby older than 3 months with a rectal temperature of 100.5 F (38.1 C) or higher for more than 1 day.  SEEK IMMEDIATE MEDICAL CARE IF:   You have chest pain or cough up blood.   You have trouble breathing or shortness of breath.   You have a headache or neck stiffness.   You have a fever.   The patient is a baby older than 3 months with a rectal temperature of 102 F (38.9 C) or higher.   The patient is a baby 20 months old or younger with a rectal temperature of 100.4 F (38 C) or higher.  MAKE SURE YOU:  Understand these instructions.   Will watch your condition.   Will get help right away if  you are not doing well or get worse.  Document Released: 06/20/2000 Document Revised: 06/12/2011 Document Reviewed: 02/20/2011 [facility] Patient Information 2012 [place], [place].

## 2012-03-27 LAB — COMPLETE METABOLIC PANEL WITH GFR
Albumin: 4.3 g/dL (ref 3.5–5.2)
BUN: 19 mg/dL (ref 6–23)
CO2: 28 mEq/L (ref 19–32)
Calcium: 9.6 mg/dL (ref 8.4–10.5)
Chloride: 105 mEq/L (ref 96–112)
GFR, Est African American: >89 mL/min
GFR, Est Non African American: >89 mL/min
Glucose, Bld: 80 mg/dL (ref 70–99)
Potassium: 4.3 mEq/L (ref 3.5–5.3)

## 2012-03-27 LAB — HIV ANTIBODY (ROUTINE TESTING W REFLEX): HIV: NONREACTIVE

## 2012-03-29 ENCOUNTER — Telehealth: Payer: Self-pay | Admitting: *Deleted

## 2012-03-29 NOTE — Telephone Encounter (Signed)
Message copied by Hassan Buckler on Mon Mar 29, 2012  9:49 AM ------      Message from: Baltazar Apo      Created: Fri Mar 26, 2012  7:54 PM      Regarding: Mr. Oswald records to be faxed to [facility]       Dear Rivka Barbara,            Can we please fax Mr. Odeh's latest chest xray, labs, and note from clinic today to [facility] health department TB nurse at [phone] attention: Zena Amos            Thank you so much,            Dr. Jannet Mantis

## 2012-03-29 NOTE — Progress Notes (Signed)
INTERNAL MEDICINE TEACHING ATTENDING ADDENDUM [name] , MD: I personally saw and evaluated Joshua Park in this clinic visit in conjunction with the resident, Dr.Qureshi. I have discussed the patient's plan of care with Dr. Virgina Organ during this visit. I have confirmed the physical exam findings and have read and agree with the clinic note including the plan.

## 2012-03-29 NOTE — Telephone Encounter (Signed)
Office note,labs, and x-ray faxed to [facility], TB nurse [phone]) as requested per Dr Virgina Organ.

## 2012-04-05 ENCOUNTER — Other Ambulatory Visit: Payer: Self-pay | Admitting: *Deleted

## 2012-04-06 ENCOUNTER — Other Ambulatory Visit: Payer: Self-pay | Admitting: Infectious Diseases

## 2012-04-06 ENCOUNTER — Ambulatory Visit
Admission: RE | Admit: 2012-04-06 | Discharge: 2012-04-06 | Disposition: A | Discharge status: Home / Self Care | Payer: No Typology Code available for payment source | Source: Ambulatory Visit | Attending: Infectious Diseases | Admitting: Infectious Diseases

## 2012-04-06 DIAGNOSIS — Z111 Encounter for screening for respiratory tuberculosis: Secondary | ICD-10-CM

## 2012-04-06 MED ORDER — OXYCODONE HCL 15 MG PO TABS: 7.5000 mg | ORAL_TABLET | ORAL | Status: DC | PRN

## 2012-04-09 ENCOUNTER — Telehealth: Payer: Self-pay | Admitting: Internal Medicine

## 2012-04-09 NOTE — Telephone Encounter (Signed)
Received a message from Dr. Roxan Hockey from [facility] who said he will be started Joshua Park on TB medication treatment.  I spoke to him this afternoon at [phone] (office #) and he claimed Joshua Park was noted to have +PPD 20mm and upper lobe infiltrate still present on cxr. sputum cx pending--f/u.  Will start him on RIPE therapy at this time. He wanted to notify PCP of treatment and also to take notice for any drug interactions with rifampin including him being on Baclofen at this time.  I will forward this information to Dr. Eben Burow as well and have discussed with Dr. [name] who evaluated Joshua Park in clinic with me on 03/26/12.

## 2012-04-14 NOTE — Telephone Encounter (Signed)
Uptodate does not mention any interaction between baclofen and rifampin. I will follow up when I see him next.

## 2012-05-05 ENCOUNTER — Other Ambulatory Visit: Payer: Self-pay | Admitting: *Deleted

## 2012-05-06 MED ORDER — OXYCODONE HCL 15 MG PO TABS: 7.5000 mg | ORAL_TABLET | ORAL | Status: DC | PRN

## 2012-05-06 NOTE — Telephone Encounter (Signed)
Pt aware Rx is ready. 

## 2012-05-12 ENCOUNTER — Ambulatory Visit: Payer: Self-pay | Admitting: Internal Medicine

## 2012-05-13 ENCOUNTER — Encounter: Payer: Self-pay | Admitting: Internal Medicine

## 2012-05-13 ENCOUNTER — Ambulatory Visit (INDEPENDENT_AMBULATORY_CARE_PROVIDER_SITE_OTHER): Payer: Medicaid Other | Admitting: Internal Medicine

## 2012-05-13 VITALS — BP 96/62 | HR 62 | Temp 96.5°F

## 2012-05-13 DIAGNOSIS — Z23 Encounter for immunization: Secondary | ICD-10-CM

## 2012-05-13 DIAGNOSIS — A150 Tuberculosis of lung: Secondary | ICD-10-CM

## 2012-05-13 DIAGNOSIS — M549 Dorsalgia, unspecified: Secondary | ICD-10-CM

## 2012-05-13 DIAGNOSIS — K5900 Constipation, unspecified: Secondary | ICD-10-CM

## 2012-05-13 DIAGNOSIS — N390 Urinary tract infection, site not specified: Secondary | ICD-10-CM

## 2012-05-13 LAB — POCT URINALYSIS DIPSTICK
Bilirubin, UA: NEGATIVE
Blood, UA: NEGATIVE
Glucose, UA: NEGATIVE
Ketones, UA: NEGATIVE
Spec Grav, UA: 1.015
Urobilinogen, UA: 0.2

## 2012-05-13 MED ORDER — SULFAMETHOXAZOLE-TRIMETHOPRIM 800-160 MG PO TABS: 1.0000 | ORAL_TABLET | Freq: Two times a day (BID) | ORAL | Status: AC

## 2012-05-13 MED ORDER — TETANUS-DIPHTH-ACELL PERTUSSIS 5-2.5-18.5 LF-MCG/0.5 IM SUSP: 0.5000 mL | Freq: Once | INTRAMUSCULAR | Status: DC

## 2012-05-13 MED ORDER — POLYETHYLENE GLYCOL 3350 17 GM/SCOOP PO POWD: 17.0000 g | Freq: Every day | ORAL | Status: DC

## 2012-05-13 MED ORDER — DOCUSATE SODIUM 50 MG PO CAPS: 100.0000 mg | ORAL_CAPSULE | Freq: Two times a day (BID) | ORAL | Status: DC

## 2012-05-13 NOTE — Patient Instructions (Addendum)
Please schedule a follow up appointment PRN. Please bring your medication bottles with your next appointment. Please take your medicines as prescribed. I will call you with your lab results if anything will be abnormal.
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
## 2012-05-14 DIAGNOSIS — K5900 Constipation, unspecified: Secondary | ICD-10-CM | POA: Insufficient documentation

## 2012-05-14 LAB — URINALYSIS, ROUTINE W REFLEX MICROSCOPIC
Glucose, UA: NEGATIVE mg/dL
Leukocytes, UA: NEGATIVE
Protein, ur: NEGATIVE mg/dL
Specific Gravity, Urine: 1.019 (ref 1.005–1.030)
pH: 6 (ref 5.0–8.0)

## 2012-05-14 NOTE — Assessment & Plan Note (Signed)
Treat him with stool softener and laxative.

## 2012-05-14 NOTE — Assessment & Plan Note (Signed)
Reports having dysuria, urgency, frequency and foul-smelling urine for 2 weeks. His eating dipstick was positive for leukocytes. -Get UA, urine cultures. -Empirically treat with Bactrim for 10 days. -Would call the patient if antibiotics need to be changed based on the results of culture and sensitivity.

## 2012-05-15 LAB — URINE CULTURE: Colony Count: 4000

## 2012-05-17 DIAGNOSIS — A150 Tuberculosis of lung: Secondary | ICD-10-CM | POA: Insufficient documentation

## 2012-05-17 NOTE — Assessment & Plan Note (Signed)
He scratched his back in moving from bed to wheelchair and was requesting a tetanus shot.  - Administered Tdap.

## 2012-05-17 NOTE — Assessment & Plan Note (Signed)
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 - Retreive records from health dept.

## 2012-05-18 ENCOUNTER — Telehealth: Payer: Self-pay | Admitting: *Deleted

## 2012-05-18 NOTE — Telephone Encounter (Signed)
Spoke to [place] from the [facility].  Pt does have active TB but is not contagious.  Pt has been on treatment for about 4 weeks now.  Is currently on the 4 drug therapy of Isoniazide 900 mg biweekly, Rifampin 600 mg biweekly, Pyrazinamide 3000 mg biweekly, Ethambutol 2800 mg biweekly and Vitamin B6 biweekly for 2 months.  Pt will then go to Isoniazide 900 mg biweekly for another 2 months and Rifampin 600 mg Biweekly.  Nurse is currently going out to patient's home on Monday and Thursday to dispense the medications.  Weight was asked to be done on patient at his next visit to the Clinics as the Health Department is unable to do so. Angelina Ok, RN 05/18/2012 11:45 AM.

## 2012-05-31 ENCOUNTER — Encounter: Payer: Self-pay | Admitting: Internal Medicine

## 2012-06-01 ENCOUNTER — Other Ambulatory Visit: Payer: Self-pay | Admitting: *Deleted

## 2012-06-01 MED ORDER — OXYCODONE HCL 15 MG PO TABS: 7.5000 mg | ORAL_TABLET | ORAL | Status: DC | PRN

## 2012-06-01 NOTE — Telephone Encounter (Signed)
Pt informed Rx is ready 

## 2012-06-01 NOTE — Telephone Encounter (Signed)
last refill 11/1 Do on Sunday

## 2012-06-08 ENCOUNTER — Ambulatory Visit: Payer: Medicaid Other | Admitting: Internal Medicine

## 2012-06-08 ENCOUNTER — Encounter: Payer: Self-pay | Admitting: Internal Medicine

## 2012-06-11 ENCOUNTER — Ambulatory Visit
Admission: RE | Admit: 2012-06-11 | Discharge: 2012-06-11 | Disposition: A | Discharge status: Home / Self Care | Payer: No Typology Code available for payment source | Source: Ambulatory Visit | Attending: Infectious Diseases | Admitting: Infectious Diseases

## 2012-06-11 ENCOUNTER — Ambulatory Visit
Admission: RE | Admit: 2012-06-11 | Discharge: 2012-06-11 | Disposition: A | Discharge status: Home / Self Care | Payer: No Typology Code available for payment source | Source: Ambulatory Visit | Attending: Internal Medicine | Admitting: Internal Medicine

## 2012-06-11 ENCOUNTER — Other Ambulatory Visit: Payer: Self-pay | Admitting: Infectious Diseases

## 2012-06-11 DIAGNOSIS — A150 Tuberculosis of lung: Secondary | ICD-10-CM

## 2012-06-11 DIAGNOSIS — M549 Dorsalgia, unspecified: Secondary | ICD-10-CM

## 2012-06-17 ENCOUNTER — Ambulatory Visit (INDEPENDENT_AMBULATORY_CARE_PROVIDER_SITE_OTHER): Payer: Medicaid Other | Admitting: Radiation Oncology

## 2012-06-17 VITALS — BP 126/74 | HR 56 | Temp 97.5°F | Wt 145.6 lb

## 2012-06-17 DIAGNOSIS — K439 Ventral hernia without obstruction or gangrene: Secondary | ICD-10-CM

## 2012-06-17 DIAGNOSIS — R109 Unspecified abdominal pain: Secondary | ICD-10-CM

## 2012-06-17 DIAGNOSIS — R1032 Left lower quadrant pain: Secondary | ICD-10-CM

## 2012-06-17 NOTE — Patient Instructions (Addendum)
Muscle Strain  A muscle strain (pulled muscle) happens when a muscle is over-stretched. Recovery usually takes 5 to 6 weeks.   HOME CARE   · Put ice on the injured area.  · Put ice in a plastic bag.  · Place a towel between your skin and the bag.  · Leave the ice on for 15 to 20 minutes at a time, every hour for the first 2 days.  · Do not use the muscle for several days or until your doctor says you can. Do not use the muscle if you have pain.  · Wrap the injured area with an elastic bandage for comfort. Do not put it on too tightly.  · Only take medicine as told by your doctor.  · Warm up before exercise. This helps prevent muscle strains.  GET HELP RIGHT AWAY IF:   There is increased pain or puffiness (swelling) in the affected area.  MAKE SURE YOU:   · Understand these instructions.  · Will watch your condition.  · Will get help right away if you are not doing well or get worse.  Document Released: 04/01/2008 Document Revised: 09/15/2011 Document Reviewed: 04/01/2008  [employer]® Patient Information ©2013 [employer].
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
## 2012-06-18 DIAGNOSIS — K439 Ventral hernia without obstruction or gangrene: Secondary | ICD-10-CM | POA: Insufficient documentation

## 2012-06-18 NOTE — Assessment & Plan Note (Addendum)
Findings on physical exam are consistent with a 3 cm reducible ventral hernia. Given that the patient also has abdominal distention, which he admits is chronic (from constipation and neurogenic bladder) but may be somewhat worsened recently, we will proceed with a CT abdomen pelvis with contrast at this time to further evaluate the patient's abdominal complaints.  -CT abdomen pelvis with contrast -Referral to general surgery pending CT results

## 2012-06-27 ENCOUNTER — Emergency Department (HOSPITAL_COMMUNITY): Payer: Medicaid Other

## 2012-06-27 ENCOUNTER — Encounter (HOSPITAL_COMMUNITY): Payer: Self-pay | Admitting: Emergency Medicine

## 2012-06-27 ENCOUNTER — Emergency Department (HOSPITAL_COMMUNITY)
Admission: EM | Admit: 2012-06-27 | Discharge: 2012-06-27 | Disposition: A | Discharge status: Home / Self Care | Payer: Medicaid Other | Attending: Emergency Medicine | Admitting: Emergency Medicine

## 2012-06-27 DIAGNOSIS — R05 Cough: Secondary | ICD-10-CM | POA: Insufficient documentation

## 2012-06-27 DIAGNOSIS — G8220 Paraplegia, unspecified: Secondary | ICD-10-CM | POA: Insufficient documentation

## 2012-06-27 DIAGNOSIS — Z872 Personal history of diseases of the skin and subcutaneous tissue: Secondary | ICD-10-CM | POA: Insufficient documentation

## 2012-06-27 DIAGNOSIS — R112 Nausea with vomiting, unspecified: Secondary | ICD-10-CM | POA: Insufficient documentation

## 2012-06-27 DIAGNOSIS — Z8719 Personal history of other diseases of the digestive system: Secondary | ICD-10-CM | POA: Insufficient documentation

## 2012-06-27 DIAGNOSIS — Z86718 Personal history of other venous thrombosis and embolism: Secondary | ICD-10-CM | POA: Insufficient documentation

## 2012-06-27 DIAGNOSIS — Z87891 Personal history of nicotine dependence: Secondary | ICD-10-CM | POA: Insufficient documentation

## 2012-06-27 DIAGNOSIS — R059 Cough, unspecified: Secondary | ICD-10-CM | POA: Insufficient documentation

## 2012-06-27 DIAGNOSIS — N12 Tubulo-interstitial nephritis, not specified as acute or chronic: Secondary | ICD-10-CM | POA: Insufficient documentation

## 2012-06-27 DIAGNOSIS — Z79899 Other long term (current) drug therapy: Secondary | ICD-10-CM | POA: Insufficient documentation

## 2012-06-27 DIAGNOSIS — R509 Fever, unspecified: Secondary | ICD-10-CM | POA: Insufficient documentation

## 2012-06-27 DIAGNOSIS — Z87440 Personal history of urinary (tract) infections: Secondary | ICD-10-CM | POA: Insufficient documentation

## 2012-06-27 LAB — CBC WITH DIFFERENTIAL/PLATELET
Basophils Absolute: 0 10*3/uL (ref 0.0–0.1)
HCT: 37.4 % — ABNORMAL LOW (ref 39.0–52.0)
Lymphocytes Relative: 12 % (ref 12–46)
Lymphs Abs: 1.6 10*3/uL (ref 0.7–4.0)
MCV: 91.2 fL (ref 78.0–100.0)
Monocytes Absolute: 1.3 10*3/uL — ABNORMAL HIGH (ref 0.1–1.0)
Neutro Abs: 10.6 10*3/uL — ABNORMAL HIGH (ref 1.7–7.7)
RBC: 4.1 MIL/uL — ABNORMAL LOW (ref 4.22–5.81)
RDW: 13.9 % (ref 11.5–15.5)
WBC: 13.5 10*3/uL — ABNORMAL HIGH (ref 4.0–10.5)

## 2012-06-27 LAB — COMPREHENSIVE METABOLIC PANEL
ALT: 33 U/L (ref 0–53)
AST: 22 U/L (ref 0–37)
CO2: 23 mEq/L (ref 19–32)
Chloride: 98 mEq/L (ref 96–112)
Creatinine, Ser: 0.64 mg/dL (ref 0.50–1.35)
GFR calc Af Amer: >90 mL/min (ref >90)
GFR calc non Af Amer: >90 mL/min (ref >90)
Glucose, Bld: 100 mg/dL — ABNORMAL HIGH (ref 70–99)
Sodium: 131 mEq/L — ABNORMAL LOW (ref 135–145)
Total Bilirubin: 0.4 mg/dL (ref 0.3–1.2)

## 2012-06-27 LAB — URINALYSIS, MICROSCOPIC ONLY
Bilirubin Urine: NEGATIVE
Glucose, UA: NEGATIVE mg/dL
Hgb urine dipstick: NEGATIVE
Ketones, ur: NEGATIVE mg/dL
Protein, ur: NEGATIVE mg/dL

## 2012-06-27 LAB — LACTIC ACID, PLASMA: Lactic Acid, Venous: 0.7 mmol/L (ref 0.5–2.2)

## 2012-06-27 MED ORDER — SODIUM CHLORIDE 0.9 % IV BOLUS (SEPSIS)
1000.0000 mL | Freq: Once | INTRAVENOUS | Status: AC
  Administered 2012-06-27: 1000 mL via INTRAVENOUS

## 2012-06-27 MED ORDER — ONDANSETRON HCL 4 MG/2ML IJ SOLN
4.0000 mg | Freq: Once | INTRAMUSCULAR | Status: AC
  Administered 2012-06-27: 4 mg via INTRAVENOUS
  Filled 2012-06-27: qty 2

## 2012-06-27 MED ORDER — SODIUM CHLORIDE 0.9 % IV SOLN: 1000.0000 mL | Freq: Once | INTRAVENOUS | Status: DC

## 2012-06-27 MED ORDER — KETOROLAC TROMETHAMINE 30 MG/ML IJ SOLN
30.0000 mg | Freq: Once | INTRAMUSCULAR | Status: AC
  Administered 2012-06-27: 30 mg via INTRAVENOUS
  Filled 2012-06-27: qty 1

## 2012-06-27 MED ORDER — CEPHALEXIN 500 MG PO CAPS: 500.0000 mg | ORAL_CAPSULE | Freq: Four times a day (QID) | ORAL | Status: DC

## 2012-06-27 MED ORDER — ACETAMINOPHEN 325 MG PO TABS
650.0000 mg | ORAL_TABLET | Freq: Once | ORAL | Status: AC
  Administered 2012-06-27: 650 mg via ORAL
  Filled 2012-06-27: qty 2

## 2012-06-27 MED ORDER — IOHEXOL 300 MG/ML  SOLN
100.0000 mL | Freq: Once | INTRAMUSCULAR | Status: AC | PRN
  Administered 2012-06-27: 100 mL via INTRAVENOUS

## 2012-06-27 MED ORDER — DEXTROSE 5 % IV SOLN
1.0000 g | Freq: Once | INTRAVENOUS | Status: AC
  Administered 2012-06-27: 1 g via INTRAVENOUS
  Filled 2012-06-27: qty 10
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
## 2012-06-27 NOTE — ED Notes (Signed)
Pt presenting to ed with c/o abdominal pain with positive nausea, and vomiting. Pt denies diarrhea. Pt states he also has positive body aches, fever and chills. Pt states onset x 2-3 days

## 2012-06-27 NOTE — ED Notes (Signed)
Patient transported to CT 

## 2012-06-29 LAB — URINE CULTURE: Colony Count: 40000

## 2012-06-30 NOTE — ED Notes (Signed)
+   Urine Patient treated with Keflex-sensitive to same-chart appended per protocol MD. 

## 2012-07-02 ENCOUNTER — Telehealth: Payer: Self-pay | Admitting: *Deleted

## 2012-07-02 NOTE — Telephone Encounter (Signed)
Fax from [facility]- requesting refill on Triamcinolone Acetonide Cream 0.1% - Apply to affected area as directed. Thanks

## 2012-07-05 ENCOUNTER — Other Ambulatory Visit: Payer: Self-pay | Admitting: Internal Medicine

## 2012-07-05 ENCOUNTER — Other Ambulatory Visit: Payer: Self-pay | Admitting: *Deleted

## 2012-07-05 MED ORDER — OXYCODONE HCL 15 MG PO TABS: 7.5000 mg | ORAL_TABLET | ORAL | Status: DC | PRN

## 2012-07-05 MED ORDER — TRIAMCINOLONE 0.1 % CREAM:EUCERIN CREAM 1:1: 1.0000 "application " | TOPICAL_CREAM | Freq: Three times a day (TID) | CUTANEOUS | Status: DC | PRN

## 2012-07-05 NOTE — Progress Notes (Signed)
Last refill 06/01/2012. OK to refill.

## 2012-07-05 NOTE — Telephone Encounter (Signed)
Last refill 11/26 Call when ready @ [phone]

## 2012-07-05 NOTE — Telephone Encounter (Signed)
Triamcinolone Acetonide rx called to [facility] as ordered per Dr Eben Burow.

## 2012-08-05 ENCOUNTER — Other Ambulatory Visit: Payer: Self-pay | Admitting: *Deleted

## 2012-08-05 MED ORDER — OXYCODONE HCL 15 MG PO TABS: 7.5000 mg | ORAL_TABLET | ORAL | Status: DC | PRN

## 2012-08-05 NOTE — Telephone Encounter (Signed)
Pt informed Rx is ready 

## 2012-08-05 NOTE — Telephone Encounter (Signed)
Last refilled 12/30 Call when ready @ [phone]

## 2012-08-23 ENCOUNTER — Encounter: Payer: Medicaid Other | Admitting: Internal Medicine

## 2012-08-26 ENCOUNTER — Encounter: Payer: Self-pay | Admitting: Licensed Clinical Social Worker

## 2012-08-26 NOTE — Progress Notes (Signed)
Patient ID: Joshua Park, male   DOB: September 13, 1970, 43 y.o.   MRN: [id number] CSW received voice mail from [place], pt's CAP case worker, on 08/24/12.  Pt in need of a service order for a Tub Transfer Bench.  CSW returned call to [facility].  As of today, 08/26/12, pt informed CAP case worker wheelchair will not pass through bathroom doorway.  Need for service order canceled at this time.

## 2012-08-30 ENCOUNTER — Ambulatory Visit (INDEPENDENT_AMBULATORY_CARE_PROVIDER_SITE_OTHER): Payer: Medicaid Other | Admitting: Internal Medicine

## 2012-08-30 ENCOUNTER — Encounter: Payer: Self-pay | Admitting: Internal Medicine

## 2012-08-30 VITALS — BP 131/84 | HR 59 | Temp 97.3°F | Ht 70.0 in

## 2012-08-30 DIAGNOSIS — G8220 Paraplegia, unspecified: Secondary | ICD-10-CM

## 2012-08-30 DIAGNOSIS — N390 Urinary tract infection, site not specified: Secondary | ICD-10-CM

## 2012-08-30 MED ORDER — CIPROFLOXACIN HCL 500 MG PO TABS: 500.0000 mg | ORAL_TABLET | Freq: Two times a day (BID) | ORAL | Status: AC

## 2012-08-30 NOTE — Progress Notes (Signed)
  Subjective:    Patient ID: Joshua Park, male    DOB: 04-13-1971, 42 y.o.   MRN: [id number]  HPI Patient is 42 year old man patient of mine who was here today for several complaints and reasons as below.  1. UTI: he felt that he has another UTI. He has had several UTI's in the past due to his foley catheter and he is usually able to tell when he has one. The symptoms include cloudy urine, lower abdominal discomfort and burning.   2. Patient is unable to collect his mails as he cannot reach for his mailbox. He requests a letter stating that his mails should be hand delivered to him/anybody in house at that time.  3. He requests a prescription of a special wheel chair which helps him to get to his shower. His previous chair broke and as per him his Home health agency will not.   Review of Systems  Constitutional: Negative for fever and chills.  HENT: Negative.   Eyes: Negative.   Respiratory: Negative for cough and shortness of breath.   Cardiovascular: Negative for chest pain and leg swelling.  Gastrointestinal: Positive for abdominal pain (L superior suprapubic "knot") and constipation (chronic). Negative for nausea, vomiting, diarrhea and blood in stool.  Genitourinary: Negative.   Musculoskeletal: Negative.   Skin: Negative.   Neurological: Negative.   Psychiatric/Behavioral: Negative.        Objective:   Physical Exam  Constitutional: He is oriented to person, place, and time. He appears well-developed and well-nourished. No distress.  Wheelchair-bound.  HENT:  Head: Normocephalic and atraumatic.  Eyes: Pupils are equal, round, and reactive to light. No scleral icterus.  Neck: Normal range of motion. Neck supple. No tracheal deviation present.  Cardiovascular: Normal rate and regular rhythm.   No murmur heard. Pulmonary/Chest: Effort normal. He has no wheezes. He has no rales.  Abdominal: Soft. Bowel sounds are normal. He exhibits distension. There is tenderness. A  hernia is present.    Patient has TTP in superior L suprapubic region.   Musculoskeletal: He exhibits edema (trace pitting edema in LLE, none in RLE; no erythema, increased warmth, or other abnormalities bilaterally).  Neurological: He is alert and oriented to person, place, and time. No cranial nerve deficit.  Skin: Skin is warm and dry. No erythema.  Psychiatric: He has a normal mood and affect. His behavior is normal.          Assessment & Plan:

## 2012-08-30 NOTE — Patient Instructions (Signed)
Urinary Tract Infection Urinary tract infections (UTIs) can develop anywhere along your urinary tract. Your urinary tract is your body's drainage system for removing wastes and extra water. Your urinary tract includes two kidneys, two ureters, a bladder, and a urethra. Your kidneys are a pair of bean-shaped organs. Each kidney is about the size of your fist. They are located below your ribs, one on each side of your spine. CAUSES Infections are caused by microbes, which are microscopic organisms, including fungi, viruses, and bacteria. These organisms are so small that they can only be seen through a microscope. Bacteria are the microbes that most commonly cause UTIs. SYMPTOMS  Symptoms of UTIs may vary by age and gender of the patient and by the location of the infection. Symptoms in young women typically include a frequent and intense urge to urinate and a painful, burning feeling in the bladder or urethra during urination. Older women and men are more likely to be tired, shaky, and weak and have muscle aches and abdominal pain. A fever may mean the infection is in your kidneys. Other symptoms of a kidney infection include pain in your back or sides below the ribs, nausea, and vomiting. DIAGNOSIS To diagnose a UTI, your caregiver will ask you about your symptoms. Your caregiver also will ask to provide a urine sample. The urine sample will be tested for bacteria and white blood cells. White blood cells are made by your body to help fight infection. TREATMENT  Typically, UTIs can be treated with medication. Because most UTIs are caused by a bacterial infection, they usually can be treated with the use of antibiotics. The choice of antibiotic and length of treatment depend on your symptoms and the type of bacteria causing your infection. HOME CARE INSTRUCTIONS  If you were prescribed antibiotics, take them exactly as your caregiver instructs you. Finish the medication even if you feel better after you  have only taken some of the medication.  Drink enough water and fluids to keep your urine clear or pale yellow.  Avoid caffeine, tea, and carbonated beverages. They tend to irritate your bladder.  Empty your bladder often. Avoid holding urine for long periods of time.  Empty your bladder before and after sexual intercourse.  After a bowel movement, women should cleanse from front to back. Use each tissue only once. SEEK MEDICAL CARE IF:   You have back pain.  You develop a fever.  Your symptoms do not begin to resolve within 3 days. SEEK IMMEDIATE MEDICAL CARE IF:   You have severe back pain or lower abdominal pain.  You develop chills.  You have nausea or vomiting.  You have continued burning or discomfort with urination. MAKE SURE YOU:   Understand these instructions.  Will watch your condition.  Will get help right away if you are not doing well or get worse. Document Released: 04/02/2005 Document Revised: 12/23/2011 Document Reviewed: 08/01/2011 Exit[employer] Patient Information 2013 [employer].  

## 2012-08-31 LAB — URINALYSIS, ROUTINE W REFLEX MICROSCOPIC
Bilirubin Urine: NEGATIVE
Glucose, UA: NEGATIVE mg/dL
Specific Gravity, Urine: >1.03 — ABNORMAL HIGH (ref 1.005–1.030)
Urobilinogen, UA: 0.2 mg/dL (ref 0.0–1.0)
pH: 6 (ref 5.0–8.0)

## 2012-08-31 LAB — URINALYSIS, MICROSCOPIC ONLY: Crystals: NONE SEEN

## 2012-09-01 LAB — URINE CULTURE: Colony Count: NO GROWTH

## 2012-09-01 NOTE — Assessment & Plan Note (Signed)
Patient wanted a form filled to document his inabilty to pick up mails from his mailbox. Patient also requested a special chair so that he could use it to transfer in shower and be able to take a bath.

## 2012-09-01 NOTE — Assessment & Plan Note (Signed)
Prescribed ciprofloxacin on the basis of last culture sensitivity results. Obtain UC, UA and will call if antibiotics will need to be changed. Advised to increased fluid intake.

## 2012-09-03 ENCOUNTER — Other Ambulatory Visit: Payer: Self-pay | Admitting: *Deleted

## 2012-09-03 NOTE — Telephone Encounter (Signed)
Last filled 1/30 Call when ready.  # [phone]

## 2012-09-08 MED ORDER — OXYCODONE HCL 15 MG PO TABS: 7.5000 mg | ORAL_TABLET | ORAL | Status: DC | PRN

## 2012-09-08 NOTE — Telephone Encounter (Signed)
Pt informed Rx is ready 

## 2012-09-09 ENCOUNTER — Other Ambulatory Visit (HOSPITAL_COMMUNITY)
Admission: RE | Admit: 2012-09-09 | Discharge: 2012-09-09 | Disposition: A | Discharge status: Home / Self Care | Payer: Medicaid Other | Source: Ambulatory Visit | Attending: Internal Medicine | Admitting: Internal Medicine

## 2012-09-09 ENCOUNTER — Encounter: Payer: Self-pay | Admitting: Internal Medicine

## 2012-09-09 ENCOUNTER — Ambulatory Visit (INDEPENDENT_AMBULATORY_CARE_PROVIDER_SITE_OTHER): Payer: Medicaid Other | Admitting: Internal Medicine

## 2012-09-09 VITALS — BP 104/62 | HR 63 | Temp 97.2°F | Ht 70.0 in

## 2012-09-09 DIAGNOSIS — Z113 Encounter for screening for infections with a predominantly sexual mode of transmission: Secondary | ICD-10-CM | POA: Insufficient documentation

## 2012-09-09 LAB — POCT URINALYSIS DIPSTICK
Glucose, UA: 100
Protein, UA: 30
Spec Grav, UA: >=1.03

## 2012-09-09 MED ORDER — SULFAMETHOXAZOLE-TRIMETHOPRIM 800-160 MG PO TABS: 1.0000 | ORAL_TABLET | Freq: Two times a day (BID) | ORAL | Status: AC

## 2012-09-09 NOTE — Patient Instructions (Signed)
1.  Start Bactrim.  Take 1 tablet twice daily for your infection.  2.  Make sure when you do the self catheterization that you clean the area well and use clean gloves.   3.  I will call you with the results of the rest of the testing when we get it back.  4.  Follow up with your primary care doctor in 1 month if everything is improved.
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
## 2012-09-10 LAB — URINALYSIS, MICROSCOPIC ONLY
Casts: NONE SEEN
Crystals: NONE SEEN
Squamous Epithelial / LPF: NONE SEEN

## 2012-09-10 LAB — URINALYSIS, ROUTINE W REFLEX MICROSCOPIC
Glucose, UA: NEGATIVE mg/dL
Nitrite: POSITIVE — AB
Specific Gravity, Urine: >1.03 — ABNORMAL HIGH (ref 1.005–1.030)
Urobilinogen, UA: 1 mg/dL (ref 0.0–1.0)

## 2012-09-11 LAB — URINE CULTURE
Colony Count: NO GROWTH
Organism ID, Bacteria: NO GROWTH

## 2012-09-22 ENCOUNTER — Other Ambulatory Visit: Payer: Self-pay | Admitting: *Deleted

## 2012-09-24 MED ORDER — GABAPENTIN 600 MG PO TABS: 600.0000 mg | ORAL_TABLET | Freq: Three times a day (TID) | ORAL | Status: DC

## 2012-09-28 ENCOUNTER — Telehealth: Payer: Self-pay | Admitting: *Deleted

## 2012-09-28 NOTE — Telephone Encounter (Signed)
error 

## 2012-10-01 ENCOUNTER — Telehealth: Payer: Self-pay | Admitting: *Deleted

## 2012-10-01 NOTE — Telephone Encounter (Signed)
Received a call from Amy RN with [facility]  - [phone] Stating she saw pt at home for recert visit and pt mentioned that he was was going to a place for angry management.  He states he is having issues with anger, anxiety and feels depressed.  She recommends getting pt in for visit and starting a med.  as pt has been upsetting family and friends.  This is a change for the patient.  Pt scheduled for 10:45 Monday.

## 2012-10-04 ENCOUNTER — Encounter: Payer: Medicaid Other | Admitting: Internal Medicine

## 2012-10-04 NOTE — Telephone Encounter (Signed)
Agree with the plan.

## 2012-10-05 ENCOUNTER — Other Ambulatory Visit: Payer: Self-pay | Admitting: *Deleted

## 2012-10-07 MED ORDER — OXYCODONE HCL 15 MG PO TABS: 7.5000 mg | ORAL_TABLET | ORAL | Status: DC | PRN

## 2012-10-27 ENCOUNTER — Other Ambulatory Visit: Payer: Self-pay | Admitting: Infectious Diseases

## 2012-10-27 ENCOUNTER — Ambulatory Visit
Admission: RE | Admit: 2012-10-27 | Discharge: 2012-10-27 | Disposition: A | Discharge status: Home / Self Care | Payer: No Typology Code available for payment source | Source: Ambulatory Visit | Attending: Infectious Diseases | Admitting: Infectious Diseases

## 2012-10-27 DIAGNOSIS — A150 Tuberculosis of lung: Secondary | ICD-10-CM

## 2012-11-01 ENCOUNTER — Ambulatory Visit: Payer: Self-pay | Admitting: Radiation Oncology

## 2012-11-05 ENCOUNTER — Ambulatory Visit: Payer: Self-pay | Admitting: Internal Medicine

## 2012-11-05 ENCOUNTER — Encounter: Payer: Self-pay | Admitting: Internal Medicine

## 2012-11-05 ENCOUNTER — Ambulatory Visit (INDEPENDENT_AMBULATORY_CARE_PROVIDER_SITE_OTHER): Payer: Medicaid Other | Admitting: Internal Medicine

## 2012-11-05 VITALS — BP 99/62 | HR 55 | Temp 96.8°F

## 2012-11-05 DIAGNOSIS — N433 Hydrocele, unspecified: Secondary | ICD-10-CM

## 2012-11-05 DIAGNOSIS — G8220 Paraplegia, unspecified: Secondary | ICD-10-CM

## 2012-11-05 DIAGNOSIS — IMO0002 Reserved for concepts with insufficient information to code with codable children: Secondary | ICD-10-CM

## 2012-11-05 DIAGNOSIS — K4090 Unilateral inguinal hernia, without obstruction or gangrene, not specified as recurrent: Secondary | ICD-10-CM

## 2012-11-05 MED ORDER — OXYCODONE HCL 15 MG PO TABS: 7.5000 mg | ORAL_TABLET | ORAL | Status: DC | PRN
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
## 2012-11-10 NOTE — Progress Notes (Signed)
Case discussed with Dr. Sadek at the time of the visit.  We reviewed the resident's history and exam and pertinent patient test results.  I agree with the assessment, diagnosis and plan of care documented in the resident's note. 

## 2012-11-23 ENCOUNTER — Encounter: Payer: Self-pay | Admitting: Internal Medicine

## 2012-11-23 ENCOUNTER — Ambulatory Visit (INDEPENDENT_AMBULATORY_CARE_PROVIDER_SITE_OTHER): Payer: Medicaid Other | Admitting: Internal Medicine

## 2012-11-23 VITALS — BP 113/74 | HR 55 | Temp 96.8°F

## 2012-11-23 DIAGNOSIS — G8929 Other chronic pain: Secondary | ICD-10-CM

## 2012-11-23 DIAGNOSIS — G8220 Paraplegia, unspecified: Secondary | ICD-10-CM

## 2012-11-23 DIAGNOSIS — IMO0002 Reserved for concepts with insufficient information to code with codable children: Secondary | ICD-10-CM

## 2012-11-23 DIAGNOSIS — Z993 Dependence on wheelchair: Secondary | ICD-10-CM

## 2012-11-23 DIAGNOSIS — M549 Dorsalgia, unspecified: Secondary | ICD-10-CM

## 2012-11-23 NOTE — Progress Notes (Signed)
Subjective:     Patient ID: Joshua MACIOLEK, male   DOB: [birth year], 42 y.o.   MRN: [id number]  HPI Main reason for visit: Power wheel chair assessment. Coming in with the caretaker Cordelia Pen.  Also present - Zenia Resides, from [facility].   Patient is a 42 year old male who is paraplegic since 2006, wheelchair bound with LE paralysis and contractures, and UE paresis and contractures, and neurogenic bladder. He dependent for most of his ADLs on his caregivers. He had a power wheelchair before which is now not working and he is in need of another one.   has a past medical history of Paraplegia; Recurrent UTI; Sacral decubitus ulcer (05/2006); S/P IVC filter (04/2005); and DVT of lower extremity (deep venous thrombosis) (07/2007).  Review of Systems  Constitutional: Negative.   HENT: Negative.   Eyes: Negative.   Respiratory: Positive for chest tightness (Chronic, posture related) and shortness of breath (not able to take a deep breath).   Cardiovascular: Negative.   Gastrointestinal: Negative.   Endocrine: Negative.   Genitourinary: Negative for frequency, hematuria, discharge, penile swelling, genital sores and penile pain. Difficulty urinating: Neurogenic bladder.  Musculoskeletal: Positive for myalgias and back pain.  Skin: Negative.   Allergic/Immunologic: Negative.   Neurological:       As per HPI  Hematological: Negative.   Psychiatric/Behavioral: Negative.        Objective:   Physical Exam  Constitutional: He is oriented to person, place, and time. He appears well-developed and well-nourished.  HENT:  Head: Normocephalic and atraumatic.  Mouth/Throat: Oropharynx is clear and moist.  Eyes: Conjunctivae are normal. Pupils are equal, round, and reactive to light.  Neck: Normal range of motion. Neck supple.  Cardiovascular: Normal rate, regular rhythm, normal heart sounds and intact distal pulses.   Pulmonary/Chest: Effort normal and breath sounds normal.  Abdominal:  Soft. Bowel sounds are normal.  Musculoskeletal: Normal range of motion.  Neurological: He is alert and oriented to person, place, and time. He displays no tremor. A sensory deficit is present. No cranial nerve deficit. Abnormal gait: Not able to ambulate.  LE: Contractures in both legs, straight legs unable to be bent at knee, motor strength 0/5 bilaterally UE: Left 3/5. Right 4/5 Left with contractures. Neither hand able to make a good grip.   Psychiatric: He has a normal mood and affect. His behavior is normal.       Assessment for power wheel chair:     In my opinion the patient is a candidate for power wheel chair. Patient needs tilt and recline on the power wheel chair in order to shift body weight. He currently has a power wheelchair 42 years old which is not running at all now. Patient is unable to ambulate or bear weight on his legs at all. Manual wheelchair is not suitable fo him. A scooter may not be appropriate because he cannot shift this weight by himself. He needs a power wheelchair to move from room to room in his home and to assist with activities of daily living. The patient is competent with complete physical and mental ability to operate the power wheelchair.   His blood pressure is well controlled at this time. No other complaints on this visit. The patient will follow up for his regular check up in 1 month.

## 2012-11-23 NOTE — Progress Notes (Signed)
PT evaluation will be sch through Debbie with [facility] due to time factor of getting the powered mobility device  per Debbie. Stanton Kidney Kabella Cassidy RN 11/23/12 2PM

## 2012-11-23 NOTE — Patient Instructions (Addendum)
Mr Rinks,  Power wheelchair evaluation done.  Blood pressure is well controlled.  Please come back in 1 month and we will follow up on other issues that you have.   Thanks, Aletta Edouard MD MPH 11/23/2012 11:38 AM

## 2012-12-06 ENCOUNTER — Other Ambulatory Visit: Payer: Self-pay | Admitting: *Deleted

## 2012-12-06 MED ORDER — OXYCODONE HCL 15 MG PO TABS: 7.5000 mg | ORAL_TABLET | ORAL | Status: DC | PRN

## 2012-12-06 NOTE — Telephone Encounter (Signed)
Gave three month's script to [facility]

## 2012-12-22 ENCOUNTER — Inpatient Hospital Stay (HOSPITAL_COMMUNITY)
Admission: EM | Admit: 2012-12-22 | Discharge: 2012-12-26 | DRG: 872 | Disposition: A | Discharge status: Home / Self Care | Payer: Medicaid Other | Attending: Internal Medicine | Admitting: Internal Medicine

## 2012-12-22 ENCOUNTER — Emergency Department (HOSPITAL_COMMUNITY): Payer: Medicaid Other

## 2012-12-22 ENCOUNTER — Encounter (HOSPITAL_COMMUNITY): Payer: Self-pay

## 2012-12-22 DIAGNOSIS — N390 Urinary tract infection, site not specified: Secondary | ICD-10-CM | POA: Diagnosis present

## 2012-12-22 DIAGNOSIS — IMO0002 Reserved for concepts with insufficient information to code with codable children: Secondary | ICD-10-CM

## 2012-12-22 DIAGNOSIS — N319 Neuromuscular dysfunction of bladder, unspecified: Secondary | ICD-10-CM | POA: Diagnosis present

## 2012-12-22 DIAGNOSIS — Z86718 Personal history of other venous thrombosis and embolism: Secondary | ICD-10-CM

## 2012-12-22 DIAGNOSIS — Z87891 Personal history of nicotine dependence: Secondary | ICD-10-CM

## 2012-12-22 DIAGNOSIS — M549 Dorsalgia, unspecified: Secondary | ICD-10-CM

## 2012-12-22 DIAGNOSIS — R630 Anorexia: Secondary | ICD-10-CM | POA: Diagnosis present

## 2012-12-22 DIAGNOSIS — Z789 Other specified health status: Secondary | ICD-10-CM

## 2012-12-22 DIAGNOSIS — R17 Unspecified jaundice: Secondary | ICD-10-CM | POA: Diagnosis present

## 2012-12-22 DIAGNOSIS — K5900 Constipation, unspecified: Secondary | ICD-10-CM | POA: Diagnosis present

## 2012-12-22 DIAGNOSIS — G8220 Paraplegia, unspecified: Secondary | ICD-10-CM | POA: Diagnosis present

## 2012-12-22 DIAGNOSIS — A498 Other bacterial infections of unspecified site: Secondary | ICD-10-CM | POA: Diagnosis present

## 2012-12-22 DIAGNOSIS — Z8611 Personal history of tuberculosis: Secondary | ICD-10-CM

## 2012-12-22 DIAGNOSIS — A419 Sepsis, unspecified organism: Principal | ICD-10-CM | POA: Diagnosis present

## 2012-12-22 DIAGNOSIS — Z993 Dependence on wheelchair: Secondary | ICD-10-CM

## 2012-12-22 DIAGNOSIS — G8929 Other chronic pain: Secondary | ICD-10-CM

## 2012-12-22 HISTORY — DX: Tuberculosis of lung: A15.0

## 2012-12-22 LAB — HEPATIC FUNCTION PANEL
ALT: 21 U/L (ref 0–53)
Alkaline Phosphatase: 79 U/L (ref 39–117)
Bilirubin, Direct: 0.2 mg/dL (ref 0.0–0.3)
Indirect Bilirubin: 1.4 mg/dL — ABNORMAL HIGH (ref 0.3–0.9)
Total Bilirubin: 1.6 mg/dL — ABNORMAL HIGH (ref 0.3–1.2)

## 2012-12-22 LAB — URINALYSIS, ROUTINE W REFLEX MICROSCOPIC
Ketones, ur: NEGATIVE mg/dL
Nitrite: NEGATIVE
Protein, ur: 30 mg/dL — AB
Urobilinogen, UA: 1 mg/dL (ref 0.0–1.0)

## 2012-12-22 LAB — POCT I-STAT, CHEM 8
Calcium, Ion: 1.09 mmol/L — ABNORMAL LOW (ref 1.12–1.23)
Chloride: 101 mEq/L (ref 96–112)
Glucose, Bld: 113 mg/dL — ABNORMAL HIGH (ref 70–99)
HCT: 44 % (ref 39.0–52.0)
TCO2: 26 mmol/L (ref 0–100)

## 2012-12-22 LAB — CBC WITH DIFFERENTIAL/PLATELET
Basophils Relative: 0 % (ref 0–1)
Eosinophils Absolute: 0 10*3/uL (ref 0.0–0.7)
Eosinophils Relative: 0 % (ref 0–5)
Hemoglobin: 13.9 g/dL (ref 13.0–17.0)
Lymphs Abs: 1 10*3/uL (ref 0.7–4.0)
MCH: 30.3 pg (ref 26.0–34.0)
MCHC: 33.4 g/dL (ref 30.0–36.0)
MCV: 90.6 fL (ref 78.0–100.0)
Monocytes Relative: 9 % (ref 3–12)
RBC: 4.59 MIL/uL (ref 4.22–5.81)

## 2012-12-22 LAB — URINE MICROSCOPIC-ADD ON

## 2012-12-22 MED ORDER — SODIUM CHLORIDE 0.9 % IV SOLN: INTRAVENOUS | Status: DC

## 2012-12-22 MED ORDER — ONDANSETRON HCL 4 MG/2ML IJ SOLN: 4.0000 mg | Freq: Three times a day (TID) | INTRAMUSCULAR | Status: DC | PRN

## 2012-12-22 MED ORDER — DEXTROSE 5 % IV SOLN
1.0000 g | Freq: Once | INTRAVENOUS | Status: AC
  Administered 2012-12-22: 1 g via INTRAVENOUS
  Filled 2012-12-22: qty 10

## 2012-12-22 MED ORDER — SODIUM CHLORIDE 0.9 % IV BOLUS (SEPSIS)
1000.0000 mL | Freq: Once | INTRAVENOUS | Status: AC
  Administered 2012-12-22: 1000 mL via INTRAVENOUS

## 2012-12-22 MED ORDER — MORPHINE SULFATE 4 MG/ML IJ SOLN
4.0000 mg | Freq: Once | INTRAMUSCULAR | Status: AC
  Administered 2012-12-22: 2 mg via INTRAVENOUS
  Filled 2012-12-22: qty 1

## 2012-12-22 MED ORDER — ONDANSETRON HCL 4 MG/2ML IJ SOLN
4.0000 mg | Freq: Once | INTRAMUSCULAR | Status: AC
  Administered 2012-12-22: 4 mg via INTRAVENOUS
  Filled 2012-12-22: qty 2

## 2012-12-22 MED ORDER — HYDROMORPHONE HCL PF 1 MG/ML IJ SOLN
1.0000 mg | INTRAMUSCULAR | Status: DC | PRN
  Administered 2012-12-22: 1 mg via INTRAVENOUS
  Filled 2012-12-22: qty 1

## 2012-12-22 NOTE — ED Notes (Signed)
Pt is a paraplegic, at home I/O caths self, states his urine has been darker, foul odor and burning when cathing self, states last night developed a high fever w/ chills, last took tylenol 4 hours ago 500 mg, pt states started having n/v this morning also, denies diarrhea. Pt states "I just feel bad".

## 2012-12-22 NOTE — ED Notes (Signed)
Pt is a paraplegic does self caths, states hx of UTI's, c/o vomiting/fever/abdomianl pain/urinary frequency and odor
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
## 2012-12-22 NOTE — ED Provider Notes (Signed)
Medical screening examination/treatment/procedure(s) were performed by non-physician practitioner and as supervising physician I was immediately available for consultation/collaboration.    Vida Roller, MD 12/22/12 [phone]

## 2012-12-23 ENCOUNTER — Encounter (HOSPITAL_COMMUNITY): Payer: Self-pay | Admitting: Internal Medicine

## 2012-12-23 ENCOUNTER — Ambulatory Visit: Payer: Medicaid Other | Admitting: Physical Therapy

## 2012-12-23 DIAGNOSIS — N390 Urinary tract infection, site not specified: Secondary | ICD-10-CM | POA: Diagnosis present

## 2012-12-23 DIAGNOSIS — Z789 Other specified health status: Secondary | ICD-10-CM | POA: Diagnosis present

## 2012-12-23 DIAGNOSIS — A419 Sepsis, unspecified organism: Secondary | ICD-10-CM | POA: Diagnosis present

## 2012-12-23 LAB — COMPREHENSIVE METABOLIC PANEL
BUN: 10 mg/dL (ref 6–23)
CO2: 26 mEq/L (ref 19–32)
Chloride: 99 mEq/L (ref 96–112)
Creatinine, Ser: 0.84 mg/dL (ref 0.50–1.35)
GFR calc non Af Amer: >90 mL/min (ref >90)
Glucose, Bld: 114 mg/dL — ABNORMAL HIGH (ref 70–99)
Total Bilirubin: 1.1 mg/dL (ref 0.3–1.2)

## 2012-12-23 LAB — CBC
MCH: 31.1 pg (ref 26.0–34.0)
Platelets: 142 10*3/uL — ABNORMAL LOW (ref 150–400)
RBC: 3.95 MIL/uL — ABNORMAL LOW (ref 4.22–5.81)
WBC: 12.8 10*3/uL — ABNORMAL HIGH (ref 4.0–10.5)

## 2012-12-23 LAB — BASIC METABOLIC PANEL
CO2: 23 mEq/L (ref 19–32)
Calcium: 9.4 mg/dL (ref 8.4–10.5)
Creatinine, Ser: 1.02 mg/dL (ref 0.50–1.35)
Glucose, Bld: 108 mg/dL — ABNORMAL HIGH (ref 70–99)

## 2012-12-23 MED ORDER — ENOXAPARIN SODIUM 40 MG/0.4ML ~~LOC~~ SOLN
40.0000 mg | SUBCUTANEOUS | Status: DC
  Administered 2012-12-23 – 2012-12-26 (×4): 40 mg via SUBCUTANEOUS
  Filled 2012-12-23 (×4): qty 0.4

## 2012-12-23 MED ORDER — GABAPENTIN 600 MG PO TABS
600.0000 mg | ORAL_TABLET | Freq: Three times a day (TID) | ORAL | Status: DC
  Administered 2012-12-23 – 2012-12-26 (×11): 600 mg via ORAL
  Filled 2012-12-23 (×12): qty 1

## 2012-12-23 MED ORDER — SODIUM CHLORIDE 0.9 % IV SOLN
INTRAVENOUS | Status: AC
  Administered 2012-12-23: 02:00:00 via INTRAVENOUS

## 2012-12-23 MED ORDER — HEPARIN SODIUM (PORCINE) 5000 UNIT/ML IJ SOLN
5000.0000 [IU] | Freq: Three times a day (TID) | INTRAMUSCULAR | Status: DC
  Filled 2012-12-23: qty 1

## 2012-12-23 MED ORDER — DEXTROSE 5 % IV SOLN
1.0000 g | INTRAVENOUS | Status: DC
  Administered 2012-12-23 – 2012-12-25 (×3): 1 g via INTRAVENOUS
  Filled 2012-12-23 (×4): qty 10

## 2012-12-23 MED ORDER — BACLOFEN 10 MG PO TABS
10.0000 mg | ORAL_TABLET | Freq: Three times a day (TID) | ORAL | Status: DC
  Administered 2012-12-23 – 2012-12-26 (×8): 10 mg via ORAL
  Filled 2012-12-23 (×12): qty 1

## 2012-12-23 MED ORDER — GABAPENTIN 600 MG PO TABS
600.0000 mg | ORAL_TABLET | Freq: Three times a day (TID) | ORAL | Status: DC
  Filled 2012-12-23: qty 1

## 2012-12-23 MED ORDER — ACETAMINOPHEN 325 MG PO TABS
650.0000 mg | ORAL_TABLET | Freq: Four times a day (QID) | ORAL | Status: DC | PRN
  Administered 2012-12-24 – 2012-12-26 (×3): 650 mg via ORAL
  Filled 2012-12-23 (×4): qty 2

## 2012-12-23 MED ORDER — SORBITOL 70 % SOLN
30.0000 mL | Freq: Every day | Status: DC | PRN
  Administered 2012-12-23 – 2012-12-24 (×2): 30 mL via ORAL
  Filled 2012-12-23 (×3): qty 30

## 2012-12-23 MED ORDER — MORPHINE SULFATE 2 MG/ML IJ SOLN: 2.0000 mg | INTRAMUSCULAR | Status: DC | PRN

## 2012-12-23 MED ORDER — MORPHINE SULFATE 2 MG/ML IJ SOLN
2.0000 mg | INTRAMUSCULAR | Status: DC | PRN
  Administered 2012-12-23 – 2012-12-25 (×8): 2 mg via INTRAVENOUS
  Filled 2012-12-23 (×9): qty 1

## 2012-12-23 MED ORDER — ACETAMINOPHEN 650 MG RE SUPP: 650.0000 mg | Freq: Four times a day (QID) | RECTAL | Status: DC | PRN
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
## 2012-12-23 NOTE — Progress Notes (Signed)
Subjective: He still has mild dysuria, and suprapubic tenderness but no penile discharge, fever, or chills since his admission.   Objective: Vital signs in last 24 hours: Filed Vitals:   12/22/12 2333 12/23/12 0543 12/23/12 0930 12/23/12 1349  BP: 93/57 107/68 132/81 118/77  Pulse: 85 80 84 82  Temp: 98.7 F (37.1 C) 98.9 F (37.2 C) 99.5 F (37.5 C) 99.8 F (37.7 C)  TempSrc: Oral Oral Oral Oral  Resp: 16 14 18 18   Weight: 156 lb 14.4 oz (71.169 kg)     SpO2: 95% 95% 95% 97%   Weight change:   Intake/Output Summary (Last 24 hours) at 12/23/12 1736 Last data filed at 12/23/12 0945  Gross per 24 hour  Intake 541.67 ml  Output   1250 ml  Net -708.33 ml   Vitals reviewed. General: resting in bed, in NAD HEENT:  no scleral icterus Cardiac: RRR, no rubs, soft murmur heard best at the left upper sternal border, no gallops Pulm: clear to auscultation bilaterally, no wheezes, rales, or rhonchi Abd: soft, nontender, nondistended, BS present Ext: warm and well perfused, no pedal edema.  Neuro: alert and oriented X3, cranial nerves II-XII grossly intact, strength and sensation to light touch equal in bilateral upper extremities. LE Muscle atrophy bilaterally secondary to paraplegia  Lab Results: Basic Metabolic Panel:  Recent Labs Lab 12/22/12 1902 12/22/12 1940 12/23/12 0530  NA 135 137 133*  K 3.8 3.7 3.6  CL 97 101 99  CO2 23  --  26  GLUCOSE 108* 113* 114*  BUN 12 12 10   CREATININE 1.02 1.20 0.84  CALCIUM 9.4  --  8.3*   Liver Function Tests:  Recent Labs Lab 12/22/12 1902 12/23/12 0530  AST 19 15  ALT 21 16  ALKPHOS 79 69  BILITOT 1.6* 1.1  PROT 7.8 6.6  ALBUMIN 3.9 3.2*    Recent Labs Lab 12/22/12 1902  LIPASE 12   CBC:  Recent Labs Lab 12/22/12 1902 12/22/12 1940 12/23/12 0530  WBC 16.0*  --  12.8*  NEUTROABS 13.6*  --   --   HGB 13.9 15.0 12.3*  HCT 41.6 44.0 36.6*  MCV 90.6  --  92.7  PLT 168  --  142*   Urine Drug  Screen: Drugs of Abuse     Component Value Date/Time   LABOPIA POS* 09/29/2011 1555   COCAINSCRNUR NEG 09/29/2011 1555   LABBENZ NEG 09/29/2011 1555   LABBARB NEG 09/29/2011 1555    Urinalysis:  Recent Labs Lab 12/22/12 2008  COLORURINE ORANGE*  LABSPEC 1.034*  PHURINE 5.0  GLUCOSEU NEGATIVE  HGBUR NEGATIVE  BILIRUBINUR SMALL*  KETONESUR NEGATIVE  PROTEINUR 30*  UROBILINOGEN 1.0  NITRITE NEGATIVE  LEUKOCYTESUR LARGE*   Studies/Results: Dg Chest 2 View  12/22/2012   *RADIOLOGY REPORT*  Clinical Data: Fever, weakness.  CHEST - 2 VIEW  Comparison: 10/27/2012  Findings: Heart and mediastinal contours are within normal limits. No focal opacities or effusions.  No acute bony abnormality.  IMPRESSION: No active cardiopulmonary disease.   Original Report Authenticated By: Charlett Nose, M.D.   Medications: I have reviewed the patient's current medications. Scheduled Meds: . baclofen  10 mg Oral TID  . cefTRIAXone (ROCEPHIN)  IV  1 g Intravenous Q24H  . enoxaparin (LOVENOX) injection  40 mg Subcutaneous Q24H  . gabapentin  600 mg Oral TID   Continuous Infusions:  PRN Meds:.acetaminophen, acetaminophen, morphine injection, sorbitol Assessment/Plan:  Sepsis due to Complicated UTI: Sepsis Resolved. UTI currently treated with  Ceftriaxone 1g IV daily with improvement clinically. Fever yesterday with Tmax of 102.79F but afebrile today.  -Continue Rocephin - continue abx for 14d  -IVF, NS @ 125cc/h  -Follow urine and blood cultures  -Urine GC/Chlamydia probe  -Acetaminophen 650mg  q6h prn pain/fever   Increased Anion Gap: Resolved. Mild, AG = 15, with normal bicarb. Unclear etiology, may be due to starvation ketosis due to vomiting and decreased PO intake.   Indirect hyperbilirubinemia: Resolved. Mild. Not likely significant, but may be related to possible underlying Gilbert's syndrome. Labs not suggestive of hemolysis. He does not have history of liver disease.   Paraplegia: With  neurogenic bladder. Gabapentin & Baclofen at home. Appt with advanced for power motor wheelchair today missed. He requests infection control RN visit at home, will consult with Care Management--he already has [facility] RN. -Continue home meds  -In/Out Cath as needed   VTE ppx: Lovenox   Dispo: Disposition is deferred at this time, awaiting improvement of current medical problems.  Anticipated discharge in approximately 1-2 day(s).   The patient does have a current PCP (Aletta Edouard, MD) and does need an [facility] hospital follow-up appointment after discharge.  The patient does have transportation limitations that hinder transportation to clinic appointments.  .Services Needed at time of discharge: Y = Yes, Blank = No PT:   OT:   RN:   Equipment:   Other:     LOS: 1 day   Ky Barban, MD 12/23/2012, 5:36 PM

## 2012-12-23 NOTE — Progress Notes (Signed)
[facility]  Patient Status: Active (receiving services up to time of hospitalization)  [facility] is providing the following services: RN  If patient discharges after hours, please call [phone].   Joshua Park 12/23/2012, 10:15 AM

## 2012-12-23 NOTE — Progress Notes (Signed)
UR COMPLETED  
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
## 2012-12-24 DIAGNOSIS — G8220 Paraplegia, unspecified: Secondary | ICD-10-CM

## 2012-12-24 DIAGNOSIS — G8929 Other chronic pain: Secondary | ICD-10-CM

## 2012-12-24 DIAGNOSIS — K5900 Constipation, unspecified: Secondary | ICD-10-CM

## 2012-12-24 DIAGNOSIS — Z789 Other specified health status: Secondary | ICD-10-CM

## 2012-12-24 DIAGNOSIS — A419 Sepsis, unspecified organism: Principal | ICD-10-CM

## 2012-12-24 LAB — URINE CULTURE: Colony Count: 50000

## 2012-12-24 LAB — BASIC METABOLIC PANEL
BUN: 7 mg/dL (ref 6–23)
Chloride: 101 mEq/L (ref 96–112)
GFR calc Af Amer: >90 mL/min (ref >90)
Potassium: 3.5 mEq/L (ref 3.5–5.1)

## 2012-12-24 LAB — CBC
HCT: 37.8 % — ABNORMAL LOW (ref 39.0–52.0)
Hemoglobin: 13 g/dL (ref 13.0–17.0)
RDW: 12.9 % (ref 11.5–15.5)
WBC: 8.7 10*3/uL (ref 4.0–10.5)

## 2012-12-24 NOTE — Progress Notes (Signed)
Subjective: His dysuria is improving, he had a fever earlier this morning with Tmax of 102F.   Objective: Vital signs in last 24 hours: Filed Vitals:   12/24/12 0357 12/24/12 0653 12/24/12 0935 12/24/12 1408  BP:  129/68 108/69 141/86  Pulse:  68 65 65  Temp: 102 F (38.9 C) 98.9 F (37.2 C) 98 F (36.7 C) 98.6 F (37 C)  TempSrc: Oral Oral    Resp:  18 18 18   Weight:      SpO2:  68% 98% 98%   Weight change:   Intake/Output Summary (Last 24 hours) at 12/24/12 1645 Last data filed at 12/24/12 1408  Gross per 24 hour  Intake 2046.25 ml  Output   1850 ml  Net 196.25 ml   Vitals reviewed.  General: resting in bed, in NAD  HEENT: no scleral icterus  Cardiac: RRR, no rubs, soft murmur heard best at the left upper sternal border, no gallops  Pulm: clear to auscultation bilaterally, no wheezes, rales, or rhonchi  Abd: soft, nontender, nondistended, BS present  Ext: warm and well perfused, no pedal edema.  Neuro: alert and oriented X3, cranial nerves II-XII grossly intact, strength and sensation to light touch equal in bilateral upper extremities. LE Muscle atrophy bilaterally secondary to paraplegia  Lab Results: Basic Metabolic Panel:  Recent Labs Lab 12/23/12 0530 12/24/12 0640  NA 133* 136  K 3.6 3.5  CL 99 101  CO2 26 24  GLUCOSE 114* 93  BUN 10 7  CREATININE 0.84 0.79  CALCIUM 8.3* 8.8   Liver Function Tests:  Recent Labs Lab 12/22/12 1902 12/23/12 0530  AST 19 15  ALT 21 16  ALKPHOS 79 69  BILITOT 1.6* 1.1  PROT 7.8 6.6  ALBUMIN 3.9 3.2*    Recent Labs Lab 12/22/12 1902  LIPASE 12   CBC:  Recent Labs Lab 12/22/12 1902  12/23/12 0530 12/24/12 0640  WBC 16.0*  --  12.8* 8.7  NEUTROABS 13.6*  --   --   --   HGB 13.9  < > 12.3* 13.0  HCT 41.6  < > 36.6* 37.8*  MCV 90.6  --  92.7 92.4  PLT 168  --  142* 139*  < > = values in this interval not displayed. Urine Drug Screen: Drugs of Abuse     Component Value Date/Time   LABOPIA POS*  09/29/2011 1555   COCAINSCRNUR NEG 09/29/2011 1555   LABBENZ NEG 09/29/2011 1555   LABBARB NEG 09/29/2011 1555    Urinalysis:  Recent Labs Lab 12/22/12 2008  COLORURINE ORANGE*  LABSPEC 1.034*  PHURINE 5.0  GLUCOSEU NEGATIVE  HGBUR NEGATIVE  BILIRUBINUR SMALL*  KETONESUR NEGATIVE  PROTEINUR 30*  UROBILINOGEN 1.0  NITRITE NEGATIVE  LEUKOCYTESUR LARGE*     Micro Results: Recent Results (from the past 240 hour(s))  CULTURE, BLOOD (ROUTINE X 2)     Status: None   Collection Time    12/22/12  7:02 PM      Result Value Range Status   Specimen Description BLOOD   Final   Special Requests BOTTLES DRAWN AEROBIC AND ANAEROBIC   Final   Culture  Setup Time 12/23/2012 03:21   Final   Culture     Final   Value:        BLOOD CULTURE RECEIVED NO GROWTH TO DATE CULTURE WILL BE HELD FOR 5 DAYS BEFORE ISSUING A FINAL NEGATIVE REPORT   Report Status PENDING   Incomplete  CULTURE, BLOOD (ROUTINE X 2)  Status: None   Collection Time    12/22/12  7:53 PM      Result Value Range Status   Specimen Description BLOOD LEFT ARM   Final   Special Requests BOTTLES DRAWN AEROBIC AND ANAEROBIC 4CC   Final   Culture  Setup Time 12/23/2012 03:21   Final   Culture     Final   Value:        BLOOD CULTURE RECEIVED NO GROWTH TO DATE CULTURE WILL BE HELD FOR 5 DAYS BEFORE ISSUING A FINAL NEGATIVE REPORT   Report Status PENDING   Incomplete  URINE CULTURE     Status: None   Collection Time    12/22/12  8:08 PM      Result Value Range Status   Specimen Description URINE, CATHETERIZED   Final   Special Requests NONE   Final   Culture  Setup Time 12/23/2012 01:30   Final   Colony Count 50,000 COLONIES/ML   Final   Culture ESCHERICHIA COLI   Final   Report Status 12/24/2012 FINAL   Final   Organism ID, Bacteria ESCHERICHIA COLI   Final   Studies/Results: Dg Chest 2 View  12/22/2012   *RADIOLOGY REPORT*  Clinical Data: Fever, weakness.  CHEST - 2 VIEW  Comparison: 10/27/2012  Findings: Heart and  mediastinal contours are within normal limits. No focal opacities or effusions.  No acute bony abnormality.  IMPRESSION: No active cardiopulmonary disease.   Original Report Authenticated By: Charlett Nose, M.D.   Medications: I have reviewed the patient's current medications. Scheduled Meds: . baclofen  10 mg Oral TID  . cefTRIAXone (ROCEPHIN)  IV  1 g Intravenous Q24H  . enoxaparin (LOVENOX) injection  40 mg Subcutaneous Q24H  . gabapentin  600 mg Oral TID   Continuous Infusions:  PRN Meds:.acetaminophen, acetaminophen, morphine injection, sorbitol Assessment/Plan: Sepsis due to Complicated UTI: Sepsis Resolved. UTI currently treated with Ceftriaxone 1g IV daily with improvement clinically. Fever yesterday with Tmax of 102.. -Continue Rocephin  - continue abx for 14d  -IVF, NS @ 125cc/h  -Follow urine and blood cultures  -Urine GC/Chlamydia pending -Acetaminophen 650mg  q6h prn pain/fever   Paraplegia: With neurogenic bladder. Gabapentin & Baclofen at home. Appt with advanced for power motor wheelchair today missed. He requests infection control RN visit at home, will consult with Care Management--he already has [facility] RN. He may need financial assistance obtaining sterile gloves for in/out self cath at home.  -Continue home meds  -In/Out Cath as needed   VTE ppx: Lovenox  Dispo: Disposition is deferred at this time, awaiting improvement of current medical problems.  Anticipated discharge in approximately  1-2 day(s).   The patient does have a current PCP (Aletta Edouard, MD) and does need an [facility] hospital follow-up appointment after discharge.  The patient does have transportation limitations that hinder transportation to clinic appointments.  .Services Needed at time of discharge: Y = Yes, Blank = No PT:   OT:   RN:   Equipment:   Other:     LOS: 2 days   Ky Barban, MD 12/24/2012, 4:45 PM

## 2012-12-24 NOTE — Progress Notes (Signed)
Patient with temp of 101.0,MD on call notified.No new order.Will continue to monitor. Murel Shenberger Joselita,RN

## 2012-12-24 NOTE — Progress Notes (Signed)
Internal Medicine Teaching Service Attending Note Date: 12/24/2012  Patient name: Joshua Park  Medical record number: [id number]  Date of birth: 02-18-1971   Complain of mild burning while urinating, better than before.  Abdominal pain, better than before.  Overnight spiked 102F.  Recent Labs Lab 12/22/12 1902 12/22/12 1940 12/23/12 0530 12/24/12 0640  HGB 13.9 15.0 12.3* 13.0  HCT 41.6 44.0 36.6* 37.8*  WBC 16.0*  --  12.8* 8.7  PLT 168  --  142* 139*    Recent Labs Lab 12/22/12 1902 12/22/12 1940 12/23/12 0530 12/24/12 0640  NA 135 137 133* 136  K 3.8 3.7 3.6 3.5  CL 97 101 99 101  CO2 23  --  26 24  GLUCOSE 108* 113* 114* 93  BUN 12 12 10 7   CREATININE 1.02 1.20 0.84 0.79  CALCIUM 9.4  --  8.3* 8.8    Recent Labs Lab 12/22/12 1902 12/23/12 0530  AST 19 15  ALT 21 16  ALKPHOS 79 69  BILITOT 1.6* 1.1  PROT 7.8 6.6  ALBUMIN 3.9 3.2*    Appears in no distress.  Exam unchanged from prior.  We will follow current treatment for now and continue to monitor fever. If temperature spikes again, we will repeat blood cultures. WBC count has improved, and is back to normal. Clinically the patient seems to be improving.  Rest per resident note.    Aletta Edouard 12/24/2012, 2:23 PM.

## 2012-12-25 DIAGNOSIS — M549 Dorsalgia, unspecified: Secondary | ICD-10-CM

## 2012-12-25 LAB — BASIC METABOLIC PANEL
CO2: 25 mEq/L (ref 19–32)
Chloride: 98 mEq/L (ref 96–112)
Potassium: 3.5 mEq/L (ref 3.5–5.1)
Sodium: 133 mEq/L — ABNORMAL LOW (ref 135–145)

## 2012-12-25 LAB — CBC
MCV: 90.6 fL (ref 78.0–100.0)
Platelets: 139 10*3/uL — ABNORMAL LOW (ref 150–400)
RBC: 4.17 MIL/uL — ABNORMAL LOW (ref 4.22–5.81)
WBC: 4.5 10*3/uL (ref 4.0–10.5)

## 2012-12-25 LAB — GC/CHLAMYDIA PROBE AMP: CT Probe RNA: NEGATIVE

## 2012-12-25 MED ORDER — OXYCODONE HCL 5 MG PO TABS
5.0000 mg | ORAL_TABLET | ORAL | Status: DC | PRN
  Administered 2012-12-25 – 2012-12-26 (×4): 5 mg via ORAL
  Filled 2012-12-25 (×5): qty 1

## 2012-12-25 MED ORDER — SENNOSIDES-DOCUSATE SODIUM 8.6-50 MG PO TABS
1.0000 | ORAL_TABLET | Freq: Two times a day (BID) | ORAL | Status: DC
  Administered 2012-12-25 – 2012-12-26 (×2): 1 via ORAL
  Filled 2012-12-25 (×4): qty 1

## 2012-12-25 MED ORDER — HYDROCODONE-ACETAMINOPHEN 5-325 MG PO TABS: 1.0000 | ORAL_TABLET | ORAL | Status: DC | PRN

## 2012-12-25 NOTE — Progress Notes (Addendum)
Subjective: He had a fever last night with Tmax of 101.73F. His dysuria is improving. He continues to self-cath while here in the hospital and would like more teaching in regards to appropriate sterile technique, he notes that he does not use sterile gloves at home. He complains of constipation for over one week, he is passing flatus.   Objective: Vital signs in last 24 hours: Filed Vitals:   12/24/12 2125 12/24/12 2320 12/25/12 0500 12/25/12 0557  BP: 148/82   137/89  Pulse: 95   68  Temp: 101.1 F (38.4 C) 98.2 F (36.8 C) 98.8 F (37.1 C) 98.8 F (37.1 C)  TempSrc: Oral Oral  Oral  Resp: 19     Weight:      SpO2: 96%   96%   Weight change:   Intake/Output Summary (Last 24 hours) at 12/25/12 0755 Last data filed at 12/25/12 1610  Gross per 24 hour  Intake    480 ml  Output   1100 ml  Net   -620 ml   Vitals reviewed.  General: resting in bed, in NAD  HEENT: no scleral icterus  Cardiac: RRR, no rubs, soft murmur heard best at the left upper sternal border, no gallops  Pulm: clear to auscultation bilaterally, no wheezes, rales, or rhonchi  Abd: soft, nontender, nondistended, BS present  Ext: warm and well perfused, no pedal edema.  Neuro: alert and oriented X3, cranial nerves II-XII grossly intact, strength and sensation to light touch equal in bilateral upper extremities. LE Muscle atrophy bilaterally secondary to paraplegia.  Lab Results: Basic Metabolic Panel:  Recent Labs Lab 12/24/12 0640 12/25/12 0500  NA 136 133*  K 3.5 3.5  CL 101 98  CO2 24 25  GLUCOSE 93 99  BUN 7 10  CREATININE 0.79 0.72  CALCIUM 8.8 8.9   Liver Function Tests:  Recent Labs Lab 12/22/12 1902 12/23/12 0530  AST 19 15  ALT 21 16  ALKPHOS 79 69  BILITOT 1.6* 1.1  PROT 7.8 6.6  ALBUMIN 3.9 3.2*    Recent Labs Lab 12/22/12 1902  LIPASE 12   No results found for this basename: AMMONIA,  in the last 168 hours CBC:  Recent Labs Lab 12/22/12 1902  12/24/12 0640  12/25/12 0500  WBC 16.0*  < > 8.7 4.5  NEUTROABS 13.6*  --   --   --   HGB 13.9  < > 13.0 13.2  HCT 41.6  < > 37.8* 37.8*  MCV 90.6  < > 92.4 90.6  PLT 168  < > 139* 139*  < > = values in this interval not displayed. Urine Drug Screen: Drugs of Abuse     Component Value Date/Time   LABOPIA POS* 09/29/2011 1555   COCAINSCRNUR NEG 09/29/2011 1555   LABBENZ NEG 09/29/2011 1555   LABBARB NEG 09/29/2011 1555    Urinalysis:  Recent Labs Lab 12/22/12 2008  COLORURINE ORANGE*  LABSPEC 1.034*  PHURINE 5.0  GLUCOSEU NEGATIVE  HGBUR NEGATIVE  BILIRUBINUR SMALL*  KETONESUR NEGATIVE  PROTEINUR 30*  UROBILINOGEN 1.0  NITRITE NEGATIVE  LEUKOCYTESUR LARGE*    Micro Results: Recent Results (from the past 240 hour(s))  CULTURE, BLOOD (ROUTINE X 2)     Status: None   Collection Time    12/22/12  7:02 PM      Result Value Range Status   Specimen Description BLOOD   Final   Special Requests BOTTLES DRAWN AEROBIC AND ANAEROBIC   Final   Culture  Setup Time 12/23/2012 03:21   Final   Culture     Final   Value:        BLOOD CULTURE RECEIVED NO GROWTH TO DATE CULTURE WILL BE HELD FOR 5 DAYS BEFORE ISSUING A FINAL NEGATIVE REPORT   Report Status PENDING   Incomplete  CULTURE, BLOOD (ROUTINE X 2)     Status: None   Collection Time    12/22/12  7:53 PM      Result Value Range Status   Specimen Description BLOOD LEFT ARM   Final   Special Requests BOTTLES DRAWN AEROBIC AND ANAEROBIC 4CC   Final   Culture  Setup Time 12/23/2012 03:21   Final   Culture     Final   Value:        BLOOD CULTURE RECEIVED NO GROWTH TO DATE CULTURE WILL BE HELD FOR 5 DAYS BEFORE ISSUING A FINAL NEGATIVE REPORT   Report Status PENDING   Incomplete  URINE CULTURE     Status: None   Collection Time    12/22/12  8:08 PM      Result Value Range Status   Specimen Description URINE, CATHETERIZED   Final   Special Requests NONE   Final   Culture  Setup Time 12/23/2012 01:30   Final   Colony Count 50,000  COLONIES/ML   Final   Culture ESCHERICHIA COLI   Final   Report Status 12/24/2012 FINAL   Final   Organism ID, Bacteria ESCHERICHIA COLI   Final   Studies/Results: No results found. Medications: I have reviewed the patient's current medications. Scheduled Meds: . baclofen  10 mg Oral TID  . cefTRIAXone (ROCEPHIN)  IV  1 g Intravenous Q24H  . enoxaparin (LOVENOX) injection  40 mg Subcutaneous Q24H  . gabapentin  600 mg Oral TID   Continuous Infusions:  PRN Meds:.acetaminophen, acetaminophen, morphine injection, sorbitol Assessment/Plan:  Complicated UTI: Sepsis Resolved. UTI currently treated with Ceftriaxone 1g IV daily with improvement clinically. Fever yesterday with Tmax of 101.75F, fever curve trending down.  -Continue Rocephin  -Continue abx for 14d  -Discontinued IVF, NS @ 125cc/h  -Urine GC/Chlamydia pending  -Acetaminophen 650mg  q6h prn pain/fever   Paraplegia: With neurogenic bladder. Gabapentin & Baclofen at home. Appt with advanced for power motor wheelchair today missed. He requests infection control RN visit at home, will consult with Care Management--he already has [facility] RN. He may need financial assistance obtaining sterile gloves for in/out self cath at home.  -Continue home meds  -In/Out Cath as needed  -Nursing care instruction for sterile technique for self-cath  Constipation - no BM in 7 days. He is passing flatus. Likely 2/2 opioid for pain -Continue sorbitol PO -senna docusate  VTE ppx: Lovenox  Dispo: Disposition is deferred at this time, awaiting improvement of current medical problems.  Anticipated discharge in approximately 1-2 day(s).   The patient does have a current PCP (Aletta Edouard, MD) and does need an [facility] hospital follow-up appointment after discharge.  The patient does not know have transportation limitations that hinder transportation to clinic appointments.  .Services Needed at time of discharge: Y = Yes, Blank = No PT:   OT:   RN:     Equipment:   Other:     LOS: 3 days   Ky Barban, MD 12/25/2012, 7:55 AM

## 2012-12-25 NOTE — Plan of Care (Signed)
Problem: Phase II Progression Outcomes Goal: Voiding independently Outcome: Not Applicable Date Met:  12/25/12 Paraplegic ; self cath

## 2012-12-26 DIAGNOSIS — N319 Neuromuscular dysfunction of bladder, unspecified: Secondary | ICD-10-CM

## 2012-12-26 MED ORDER — SULFAMETHOXAZOLE-TRIMETHOPRIM 800-160 MG PO TABS: 1.0000 | ORAL_TABLET | Freq: Two times a day (BID) | ORAL | Status: DC

## 2012-12-26 MED ORDER — CIPROFLOXACIN HCL 500 MG PO TABS: 500.0000 mg | ORAL_TABLET | Freq: Two times a day (BID) | ORAL | Status: DC

## 2012-12-26 NOTE — Progress Notes (Addendum)
Subjective: He did not have fevers overnight. His dysuria is improving. He continues to self-cath while here in the hospital and has received more teaching in regards to appropriate sterile technique.  Objective: Vital signs in last 24 hours: Filed Vitals:   12/25/12 1044 12/25/12 1859 12/25/12 2112 12/26/12 0547  BP: 163/100 103/67 128/79 113/67  Pulse: 62 82 72 69  Temp: 99.5 F (37.5 C) 99.1 F (37.3 C) 98.9 F (37.2 C) 98.6 F (37 C)  TempSrc: Oral Oral Oral Oral  Resp: 18 18 18 18   Weight:      SpO2: 99% 96% 96% 97%   Weight change:   Intake/Output Summary (Last 24 hours) at 12/26/12 0936 Last data filed at 12/26/12 0700  Gross per 24 hour  Intake    580 ml  Output    625 ml  Net    -45 ml   Vitals reviewed.  General: resting in bed, in NAD  HEENT: no scleral icterus  Cardiac: RRR, no rubs, soft murmur heard best at the left upper sternal border, no gallops  Pulm: clear to auscultation bilaterally, no wheezes, rales, or rhonchi  Abd: soft, nontender, nondistended, BS present  Ext: warm and well perfused, no pedal edema.  Neuro: alert and oriented X3, cranial nerves II-XII grossly intact, strength and sensation to light touch equal in bilateral upper extremities. LE Muscle atrophy bilaterally secondary to paraplegia.  Lab Results: Basic Metabolic Panel:  Recent Labs Lab 12/24/12 0640 12/25/12 0500  NA 136 133*  K 3.5 3.5  CL 101 98  CO2 24 25  GLUCOSE 93 99  BUN 7 10  CREATININE 0.79 0.72  CALCIUM 8.8 8.9   Liver Function Tests:  Recent Labs Lab 12/22/12 1902 12/23/12 0530  AST 19 15  ALT 21 16  ALKPHOS 79 69  BILITOT 1.6* 1.1  PROT 7.8 6.6  ALBUMIN 3.9 3.2*    Recent Labs Lab 12/22/12 1902  LIPASE 12   No results found for this basename: AMMONIA,  in the last 168 hours CBC:  Recent Labs Lab 12/22/12 1902  12/24/12 0640 12/25/12 0500  WBC 16.0*  < > 8.7 4.5  NEUTROABS 13.6*  --   --   --   HGB 13.9  < > 13.0 13.2  HCT 41.6  <  > 37.8* 37.8*  MCV 90.6  < > 92.4 90.6  PLT 168  < > 139* 139*  < > = values in this interval not displayed. Urine Drug Screen: Drugs of Abuse     Component Value Date/Time   LABOPIA POS* 09/29/2011 1555   COCAINSCRNUR NEG 09/29/2011 1555   LABBENZ NEG 09/29/2011 1555   LABBARB NEG 09/29/2011 1555    Urinalysis:  Recent Labs Lab 12/22/12 2008  COLORURINE ORANGE*  LABSPEC 1.034*  PHURINE 5.0  GLUCOSEU NEGATIVE  HGBUR NEGATIVE  BILIRUBINUR SMALL*  KETONESUR NEGATIVE  PROTEINUR 30*  UROBILINOGEN 1.0  NITRITE NEGATIVE  LEUKOCYTESUR LARGE*    Studies/Results: No results found. Medications: I have reviewed the patient's current medications. Scheduled Meds: . baclofen  10 mg Oral TID  . cefTRIAXone (ROCEPHIN)  IV  1 g Intravenous Q24H  . enoxaparin (LOVENOX) injection  40 mg Subcutaneous Q24H  . gabapentin  600 mg Oral TID  . senna-docusate  1 tablet Oral BID   Continuous Infusions:  PRN Meds:.acetaminophen, acetaminophen, oxyCODONE, sorbitol Assessment/Plan:  Complicated UTI: Sepsis Resolved. UTI currently treated with Ceftriaxone 1g IV daily with improvement clinically. Afebrile 24 hours and will discharge today  with bactrim for total course of 14 days.  -Urine GC/Chlamydia negative   Paraplegia: With neurogenic bladder. Gabapentin & Baclofen at home. Appt with advanced for power motor wheelchair today missed. He requests infection control RN visit at home, will consult with Care Management--he already has [facility] RN. He may need financial assistance obtaining sterile gloves for in/out self cath at home.  -Continue home meds  -In/Out Cath as needed  -Nursing care instruction for sterile technique for self-cath  Constipation - no BM in 7 days. He is passing flatus. Likely 2/2 opioid for pain -Continue sorbitol PO -senna docusate  VTE ppx: Lovenox  Dispo: Disposition is deferred at this time, awaiting improvement of current medical problems.  Anticipated discharge is today.    The patient does have a current PCP (Aletta Edouard, MD) and does need an [facility] hospital follow-up appointment after discharge.  The patient does not know have transportation limitations that hinder transportation to clinic appointments.  .Services Needed at time of discharge: Y = Yes, Blank = No PT:   OT:   RN:   Equipment:   Other:     LOS: 4 days   Judie Bonus, MD 12/26/2012, 9:36 AM

## 2012-12-26 NOTE — Discharge Summary (Signed)
Internal Medicine Teaching Service Attending Note Date: 12/26/2012  Patient name: Joshua Park  Medical record number: [id number]  Date of birth: 1970/08/23    I evaluated the patient on the day of discharge and discussed the discharge plan with my resident team. I agree with the discharge documentation and disposition by Dr. Dorise Hiss.    Thanks Aletta Edouard 12/26/2012, 5:00 PM
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
## 2012-12-26 NOTE — Progress Notes (Signed)
Patient is refusing AM lab draws at this time.  Will reconsider at a later time.  Anoop Hemmer RN, Justine Null

## 2012-12-27 ENCOUNTER — Other Ambulatory Visit: Payer: Self-pay | Admitting: *Deleted

## 2012-12-28 MED ORDER — GABAPENTIN 600 MG PO TABS: 600.0000 mg | ORAL_TABLET | Freq: Three times a day (TID) | ORAL | Status: DC

## 2012-12-29 LAB — CULTURE, BLOOD (ROUTINE X 2): Culture: NO GROWTH

## 2013-01-02 NOTE — Assessment & Plan Note (Signed)
With his recurrent infections we will also check for GC today.    GC urinary probe is negative.

## 2013-01-02 NOTE — Assessment & Plan Note (Addendum)
We discussed proper technique today for self catheterization since he has recurrent infections.  Nursing was able to observe his technique today and give pointers. They noted that he does not clean the penis prior to insertion of the catheter and that he opens the catheter package with his teeth before inserting it.    UA today show positive nitrates.  We will start with Bactrim today and follow the culture result.  

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
## 2013-01-03 ENCOUNTER — Telehealth: Payer: Self-pay | Admitting: *Deleted

## 2013-01-03 MED ORDER — GABAPENTIN 300 MG PO CAPS: 600.0000 mg | ORAL_CAPSULE | Freq: Three times a day (TID) | ORAL | Status: DC

## 2013-01-03 NOTE — Telephone Encounter (Signed)
Received PA request from pt's pharmacy for gabapentin 600 mg tablets, per pharmacy, medicaid will only pay for the capsules. Info confirmed with medicaid, but gabapentin capsules only comes in 100, 300, or 400 mg.  Medicaid suggested MD write new rx for 300mg  two capsules three times daily # 180.  Prior authorization not required.  Will send to pcp for signature/review. Please advise.Kingsley Spittle Cassady6/30/20148:39 AM

## 2013-01-03 NOTE — Telephone Encounter (Signed)
Gave new prescription.

## 2013-01-18 ENCOUNTER — Encounter: Payer: Medicaid Other | Admitting: Internal Medicine

## 2013-01-27 ENCOUNTER — Ambulatory Visit: Payer: Medicaid Other | Attending: Internal Medicine | Admitting: Physical Therapy

## 2013-01-27 DIAGNOSIS — IMO0001 Reserved for inherently not codable concepts without codable children: Secondary | ICD-10-CM | POA: Insufficient documentation

## 2013-01-27 DIAGNOSIS — M24559 Contracture, unspecified hip: Secondary | ICD-10-CM | POA: Insufficient documentation

## 2013-02-01 ENCOUNTER — Ambulatory Visit (INDEPENDENT_AMBULATORY_CARE_PROVIDER_SITE_OTHER): Payer: Medicaid Other | Admitting: Internal Medicine

## 2013-02-01 ENCOUNTER — Encounter: Payer: Self-pay | Admitting: Internal Medicine

## 2013-02-01 VITALS — BP 133/93 | HR 59 | Temp 96.8°F

## 2013-02-01 DIAGNOSIS — Z8719 Personal history of other diseases of the digestive system: Secondary | ICD-10-CM

## 2013-02-01 DIAGNOSIS — R1032 Left lower quadrant pain: Secondary | ICD-10-CM

## 2013-02-01 DIAGNOSIS — K4090 Unilateral inguinal hernia, without obstruction or gangrene, not specified as recurrent: Secondary | ICD-10-CM

## 2013-02-01 DIAGNOSIS — R300 Dysuria: Secondary | ICD-10-CM

## 2013-02-01 LAB — URINALYSIS, ROUTINE W REFLEX MICROSCOPIC
Bilirubin Urine: NEGATIVE
Protein, ur: NEGATIVE mg/dL
Specific Gravity, Urine: 1.028 (ref 1.005–1.030)
Urobilinogen, UA: 0.2 mg/dL (ref 0.0–1.0)

## 2013-02-01 MED ORDER — PHENAZOPYRIDINE HCL 100 MG PO TABS: 100.0000 mg | ORAL_TABLET | Freq: Three times a day (TID) | ORAL | Status: DC | PRN

## 2013-02-01 NOTE — Patient Instructions (Addendum)
Mr Hausman,   We will do a urine exam for your urinary problem. I will give you a medicine if needed based on the results. For now, I have given you PYRIDIUM, a medicine for pain during urination which you will take only AS NEEDED for pain. Do not take this medicine for long periods of time.  We have discussed with you that your power wheelchair paperwork is still going on. It usually takes 3-4 months or longer.   Lets meet again in 3 months.  Thanks, Aletta Edouard MD MPH 02/01/2013 10:50 AM

## 2013-02-01 NOTE — Progress Notes (Signed)
Subjective:     Patient ID: Joshua Park, male   DOB: [birth year], 42 y.o.   MRN: [id number]  HPI Joshua Park is here for a follow up visit after his hospitalization for acute pyelonephritis last month. He says that he felt much better after discharge and his urine did not smell foul. He has been practicing aseptic techniques for catheterizing himself, however, recently he has started feeling the burning sensation again, and this morning his urine was fowl smelling again. He denies flank pain, nausea, vomiting, fever or chills. He denies urgency or frequency.   As a separate complaint, he says that he was diagnosed of having inguinal hernia many years back, and he has abdominal pain from it which is worsening. He points to multiple places on the left side of his groin/thigh/abdomen region, where I am not able to see a bulge. The patient, however, is keen on seeing a Careers adviser for second opinion.  The patient is also very upset about his power wheelchair and wants to know why he has not received it yet.   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  Review of Systems As per HPI    Objective:   Physical Exam General: No acute distress. Sitting in a wheelchair.  HEENT: PERRL, EOMI.   CV: S1S2 RRR, no murmur Lungs: Bilateral Vesicular breath sounds.  Abdomen: Soft, non-tender, benign.  No flank tenderness.  Pedal Edema: absent Pedal pulses present.  Neuro: Alert and oriented times 3. Paraplegia (chronic). Contractures in lower extremities. His position is sitting in a wheelchair, so its not very conducive for a hernia exam. I did not feel any bulge on coughing on the left side. The abdominal exam was benign.     Assessment & Plan:     Dysuria - Dipstick could not be done due to some error of communication. We have sent the patient's urine for  UA and UC. Results would be followed and infection if present would be treated accordingly. For now, the patient has been given Pyridium for some urinary analgesia.   ? Inguinal Hernia - Surgical referral per patient preference done.  Power wheelchair follow up- We followed up with the patient about this. Apparently, I am told that the delay has been because the patient failed to attend the PT appointment on time. His PT paperwork is now under process. Usually, power wheelchair processing takes about 3-4 months normally.  RTC in 3 months.

## 2013-02-02 ENCOUNTER — Other Ambulatory Visit: Payer: Self-pay | Admitting: Internal Medicine

## 2013-02-02 DIAGNOSIS — N390 Urinary tract infection, site not specified: Secondary | ICD-10-CM

## 2013-02-02 LAB — URINALYSIS, MICROSCOPIC ONLY

## 2013-02-02 MED ORDER — SULFAMETHOXAZOLE-TRIMETHOPRIM 800-160 MG PO TABS: 1.0000 | ORAL_TABLET | Freq: Two times a day (BID) | ORAL | Status: DC

## 2013-02-02 NOTE — Progress Notes (Signed)
Pt aware.

## 2013-02-04 ENCOUNTER — Other Ambulatory Visit: Payer: Self-pay | Admitting: *Deleted

## 2013-02-04 LAB — URINE CULTURE: Colony Count: >=100000

## 2013-02-04 NOTE — Telephone Encounter (Signed)
done

## 2013-02-14 ENCOUNTER — Ambulatory Visit (INDEPENDENT_AMBULATORY_CARE_PROVIDER_SITE_OTHER): Payer: Medicaid Other | Admitting: Surgery

## 2013-02-14 ENCOUNTER — Encounter (INDEPENDENT_AMBULATORY_CARE_PROVIDER_SITE_OTHER): Payer: Self-pay | Admitting: Surgery

## 2013-02-14 VITALS — BP 128/68 | HR 72 | Temp 97.3°F | Resp 15 | Ht 71.0 in

## 2013-02-14 DIAGNOSIS — R1032 Left lower quadrant pain: Secondary | ICD-10-CM
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
## 2013-02-14 NOTE — Patient Instructions (Signed)
Will schedule CT scan to evaluate abdominal pain.  Will decide on surgery after this is done.

## 2013-02-15 ENCOUNTER — Other Ambulatory Visit: Payer: Medicaid Other

## 2013-02-16 ENCOUNTER — Ambulatory Visit
Admission: RE | Admit: 2013-02-16 | Discharge: 2013-02-16 | Disposition: A | Discharge status: Home / Self Care | Payer: Medicaid Other | Source: Ambulatory Visit | Attending: Surgery | Admitting: Surgery

## 2013-02-16 DIAGNOSIS — R1032 Left lower quadrant pain: Secondary | ICD-10-CM

## 2013-02-16 MED ORDER — IOHEXOL 300 MG/ML  SOLN
100.0000 mL | Freq: Once | INTRAMUSCULAR | Status: AC | PRN
  Administered 2013-02-16: 100 mL via INTRAVENOUS

## 2013-02-22 ENCOUNTER — Telehealth (INDEPENDENT_AMBULATORY_CARE_PROVIDER_SITE_OTHER): Payer: Self-pay

## 2013-02-22 DIAGNOSIS — K921 Melena: Secondary | ICD-10-CM

## 2013-02-22 NOTE — Telephone Encounter (Signed)
I called patient and let him know CT was negative for hernia but did show constipation. Advised to take 17 grams of miralax BID per Dr Luisa Hart. He has not see GI for bloody stools and would like [place] to put referral in. Advised someone would call him with that appt.

## 2013-02-22 NOTE — Telephone Encounter (Signed)
Message copied by Brennan Bailey on Tue Feb 22, 2013  3:42 PM ------      Message from: Harriette Bouillon A      Created: Thu Feb 17, 2013  7:20 AM       CONSTIPATION  NO HERNIA.  MIRALAX 17 GRAMS TWICE A DAY.   NEEDS TO SEE GI MD IF NOT DONE YET FOR HISTORY OF BLOOD IN STOOL. ------

## 2013-02-23 ENCOUNTER — Telehealth (INDEPENDENT_AMBULATORY_CARE_PROVIDER_SITE_OTHER): Payer: Self-pay | Admitting: General Surgery

## 2013-02-23 NOTE — Telephone Encounter (Signed)
LMOM letting pt know he has an appt w/ Dr. Christella Hartigan on 03/30/13 at 1:45. Address and phone number given.

## 2013-02-25 ENCOUNTER — Encounter: Payer: Self-pay | Admitting: Internal Medicine

## 2013-02-25 ENCOUNTER — Ambulatory Visit (INDEPENDENT_AMBULATORY_CARE_PROVIDER_SITE_OTHER): Payer: Medicaid Other | Admitting: Internal Medicine

## 2013-02-25 VITALS — BP 100/66 | HR 62 | Temp 97.0°F

## 2013-02-25 DIAGNOSIS — K5900 Constipation, unspecified: Secondary | ICD-10-CM

## 2013-02-25 DIAGNOSIS — N390 Urinary tract infection, site not specified: Secondary | ICD-10-CM

## 2013-02-25 MED ORDER — SORBITOL 70 % PO SOLN: 30.0000 mL | Freq: Every day | ORAL | Status: DC | PRN

## 2013-02-25 MED ORDER — SULFAMETHOXAZOLE-TMP DS 800-160 MG PO TABS: 1.0000 | ORAL_TABLET | Freq: Two times a day (BID) | ORAL | Status: DC

## 2013-02-25 MED ORDER — DOCUSATE SODIUM 100 MG PO CAPS: 100.0000 mg | ORAL_CAPSULE | Freq: Two times a day (BID) | ORAL | Status: AC

## 2013-02-25 MED ORDER — SORBITOL 70 % PO SOLN: 15.0000 mL | Freq: Every day | ORAL | Status: DC | PRN

## 2013-02-25 NOTE — Patient Instructions (Addendum)
-  Take Bactrim DS twice per day for 7 days. We will call you if you need a different antibiotic.  -Take sorbitol 30mg  daily as needed for constipation. Stop taking this medication if your stool are too soft.  -Take Colace 100mg  two times daily. This is a stool softener.  - Follow up with your PCP, Dr. Dalphine Handing in 1-2 months for routine follow up visit.

## 2013-02-26 ENCOUNTER — Other Ambulatory Visit: Payer: Self-pay | Admitting: Internal Medicine

## 2013-02-26 ENCOUNTER — Telehealth: Payer: Self-pay | Admitting: Internal Medicine

## 2013-02-26 LAB — URINALYSIS, ROUTINE W REFLEX MICROSCOPIC
Bilirubin Urine: NEGATIVE
Nitrite: NEGATIVE
Protein, ur: NEGATIVE mg/dL
Specific Gravity, Urine: 1.024 (ref 1.005–1.030)
Urobilinogen, UA: 1 mg/dL (ref 0.0–1.0)

## 2013-02-26 LAB — URINALYSIS, MICROSCOPIC ONLY
Bacteria, UA: NONE SEEN
Casts: NONE SEEN

## 2013-02-26 NOTE — Assessment & Plan Note (Addendum)
Last UTI in July with urine culture growth of Enterobacter and E.coli sensitive to Bactrim and Nitrofurantoin but resistant to ciprofloxacin.  UA with small leuc and WBC, nitrite negative.   Rx for Bactrim DS BID for 7 days for complicated UTI.  Will follow up on urine culture. Will change abx if needed. Pt agrees with plan.

## 2013-02-26 NOTE — Assessment & Plan Note (Signed)
Pt to take sorbitol 30mg  PRN for constipation, advised to stop taking this if diarrhea occurs.  Pt instructed to take Colace 100mg  BID

## 2013-02-26 NOTE — Telephone Encounter (Signed)
Patient called in afternoon (8/23/), reported that he could not get his gabapentin be refilled. He needs to get authorization from his insurance company for refill. He asked me to forward this information to his PCP for help.  Lorretta Harp, MD PGY3, Internal Medicine Teaching Service Pager: [phone]
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
## 2013-02-27 LAB — URINE CULTURE
Colony Count: NO GROWTH
Organism ID, Bacteria: NO GROWTH

## 2013-02-28 NOTE — Progress Notes (Signed)
Case discussed with Dr. Kennerly soon after the resident saw the patient.  We reviewed the resident's history and exam and pertinent patient test results.  I agree with the assessment, diagnosis, and plan of care documented in the resident's note. 

## 2013-03-01 ENCOUNTER — Telehealth: Payer: Self-pay | Admitting: *Deleted

## 2013-03-01 NOTE — Telephone Encounter (Signed)
Call from pt stating he was unable to get his gabapentin 600mg  tablets take one TID #90.  Also received PA request from pt's pharmacy [facility] ([address] Medicaid will only pay for the capsules and not the tablets.  Info was confirmed with medicaid, but gabapentin capsules only comes in 100, 300, or 400 mg. Medicaid suggested MD write rx for 300mg  two capsules three times daily # 180. Prior authorization not required. Pharmacy was able to send rx through without any problems, pt aware.Joshua Spittle Cassady8/26/201411:16 AM   Of note, pcp was made aware of the change on 01/03/13, but pt [facility] [address] and they had the old rx.  Phone call complete.Joshua Park, Joshua Longanecker Cassady8/26/201411:21 AM

## 2013-03-08 ENCOUNTER — Telehealth: Payer: Self-pay | Admitting: *Deleted

## 2013-03-08 NOTE — Telephone Encounter (Signed)
Please do sept, oct and nov, we will hold them and give them out each month

## 2013-03-09 MED ORDER — OXYCODONE HCL 15 MG PO TABS: 15.0000 mg | ORAL_TABLET | ORAL | Status: DC | PRN

## 2013-03-09 NOTE — Telephone Encounter (Signed)
Called pharmacy and Rx for Neurontin did not need PA since it was in capsule.

## 2013-03-11 ENCOUNTER — Other Ambulatory Visit: Payer: Self-pay | Admitting: Internal Medicine

## 2013-03-11 MED ORDER — OXYCODONE HCL 15 MG PO TABS: 15.0000 mg | ORAL_TABLET | ORAL | Status: DC | PRN

## 2013-03-11 NOTE — Telephone Encounter (Signed)
Rx was written for #30, Dr Dalphine Handing reprinted for # 75

## 2013-03-11 NOTE — Addendum Note (Signed)
Addended byAletta Edouard on: 03/11/2013 02:04 PM   Modules accepted: Orders

## 2013-03-21 ENCOUNTER — Telehealth: Payer: Self-pay | Admitting: *Deleted

## 2013-03-21 NOTE — Telephone Encounter (Signed)
Call from pt's mother asking about getting patients bath chair repaired. His chair was made for him in [place] about 8 years ago. Mother states he has not had a bath in over a year as the chair is broken.  Chilon will research the above and see what needs to be done for repair.  Mother's # [phone]

## 2013-03-28 ENCOUNTER — Encounter (HOSPITAL_COMMUNITY): Payer: Self-pay | Admitting: Emergency Medicine

## 2013-03-28 ENCOUNTER — Emergency Department (HOSPITAL_COMMUNITY): Payer: Medicaid Other

## 2013-03-28 ENCOUNTER — Other Ambulatory Visit: Payer: Self-pay | Admitting: Internal Medicine

## 2013-03-28 ENCOUNTER — Telehealth: Payer: Self-pay | Admitting: *Deleted

## 2013-03-28 ENCOUNTER — Emergency Department (HOSPITAL_COMMUNITY)
Admission: EM | Admit: 2013-03-28 | Discharge: 2013-03-29 | Disposition: A | Discharge status: Home / Self Care | Payer: Medicaid Other | Attending: Emergency Medicine | Admitting: Emergency Medicine

## 2013-03-28 DIAGNOSIS — Z87891 Personal history of nicotine dependence: Secondary | ICD-10-CM | POA: Insufficient documentation

## 2013-03-28 DIAGNOSIS — G8220 Paraplegia, unspecified: Secondary | ICD-10-CM

## 2013-03-28 DIAGNOSIS — N390 Urinary tract infection, site not specified: Secondary | ICD-10-CM | POA: Insufficient documentation

## 2013-03-28 DIAGNOSIS — Z8669 Personal history of other diseases of the nervous system and sense organs: Secondary | ICD-10-CM | POA: Insufficient documentation

## 2013-03-28 DIAGNOSIS — Z86718 Personal history of other venous thrombosis and embolism: Secondary | ICD-10-CM | POA: Insufficient documentation

## 2013-03-28 DIAGNOSIS — Z8611 Personal history of tuberculosis: Secondary | ICD-10-CM | POA: Insufficient documentation

## 2013-03-28 DIAGNOSIS — Z792 Long term (current) use of antibiotics: Secondary | ICD-10-CM | POA: Insufficient documentation

## 2013-03-28 DIAGNOSIS — Z79899 Other long term (current) drug therapy: Secondary | ICD-10-CM | POA: Insufficient documentation

## 2013-03-28 DIAGNOSIS — Z872 Personal history of diseases of the skin and subcutaneous tissue: Secondary | ICD-10-CM | POA: Insufficient documentation

## 2013-03-28 LAB — COMPREHENSIVE METABOLIC PANEL
ALT: 10 U/L (ref 0–53)
AST: 15 U/L (ref 0–37)
CO2: 23 mEq/L (ref 19–32)
Chloride: 99 mEq/L (ref 96–112)
Creatinine, Ser: 1.03 mg/dL (ref 0.50–1.35)
GFR calc non Af Amer: 89 mL/min — ABNORMAL LOW (ref >90)
Glucose, Bld: 124 mg/dL — ABNORMAL HIGH (ref 70–99)
Sodium: 136 mEq/L (ref 135–145)
Total Bilirubin: 1 mg/dL (ref 0.3–1.2)

## 2013-03-28 LAB — CBC WITH DIFFERENTIAL/PLATELET
Basophils Absolute: 0 10*3/uL (ref 0.0–0.1)
HCT: 42.5 % (ref 39.0–52.0)
Lymphocytes Relative: 5 % — ABNORMAL LOW (ref 12–46)
Lymphs Abs: 0.8 10*3/uL (ref 0.7–4.0)
Monocytes Absolute: 1.3 10*3/uL — ABNORMAL HIGH (ref 0.1–1.0)
Neutro Abs: 12.6 10*3/uL — ABNORMAL HIGH (ref 1.7–7.7)
RBC: 4.63 MIL/uL (ref 4.22–5.81)
RDW: 13.3 % (ref 11.5–15.5)
WBC: 14.8 10*3/uL — ABNORMAL HIGH (ref 4.0–10.5)

## 2013-03-28 MED ORDER — OXYCODONE-ACETAMINOPHEN 5-325 MG PO TABS
1.0000 | ORAL_TABLET | Freq: Once | ORAL | Status: AC
  Administered 2013-03-28: 1 via ORAL
  Filled 2013-03-28: qty 1

## 2013-03-28 MED ORDER — SODIUM CHLORIDE 0.9 % IV SOLN
1000.0000 mL | Freq: Once | INTRAVENOUS | Status: AC
  Administered 2013-03-28: 1000 mL via INTRAVENOUS

## 2013-03-28 MED ORDER — SODIUM CHLORIDE 0.9 % IV SOLN
1000.0000 mL | INTRAVENOUS | Status: DC
  Administered 2013-03-28: 1000 mL via INTRAVENOUS

## 2013-03-28 MED ORDER — ACETAMINOPHEN 325 MG PO TABS
650.0000 mg | ORAL_TABLET | Freq: Once | ORAL | Status: AC
  Administered 2013-03-28: 650 mg via ORAL
  Filled 2013-03-28: qty 2
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
## 2013-03-28 NOTE — Progress Notes (Signed)
Shower stool provided as per patient request.

## 2013-03-28 NOTE — ED Notes (Signed)
Pt states today about 3 pm today he got the chills and then started having a fever  Pt states he went home and cut the heat on and got up under the covers  Pt states he gets frequent UTIs and feels as if this may be one  Pt is a paraplegic and does I&O caths on himself

## 2013-03-28 NOTE — Telephone Encounter (Signed)
Returned call to pt [phone] - left message on ID recording 2:35PM amd 3:10PM. Talked with pt 4:04PM - c/o of chills and ? Temp. Pt unable to check temp.  He thinks it may be UTI - hx. Has not eaten today. Hard to get info from pt - suggest to call EMS to take to ER. Question pt about burning and freq urination - no answer. Stanton Kidney Kushal Saunders RN 03/28/13 4:10PM

## 2013-03-28 NOTE — ED Notes (Signed)
Pt self catheter and catheter is at bedside

## 2013-03-29 ENCOUNTER — Encounter: Payer: Self-pay | Admitting: Internal Medicine

## 2013-03-29 ENCOUNTER — Ambulatory Visit: Payer: Medicaid Other | Admitting: Internal Medicine

## 2013-03-29 LAB — URINALYSIS, ROUTINE W REFLEX MICROSCOPIC
Hgb urine dipstick: NEGATIVE
Protein, ur: NEGATIVE mg/dL
Urobilinogen, UA: 1 mg/dL (ref 0.0–1.0)

## 2013-03-29 LAB — URINE MICROSCOPIC-ADD ON

## 2013-03-29 MED ORDER — DEXTROSE 5 % IV SOLN
1.0000 g | Freq: Once | INTRAVENOUS | Status: AC
  Administered 2013-03-29: 1 g via INTRAVENOUS
  Filled 2013-03-29: qty 10

## 2013-03-29 MED ORDER — CIPROFLOXACIN HCL 500 MG PO TABS
500.0000 mg | ORAL_TABLET | Freq: Two times a day (BID) | ORAL | Status: DC
Start: 2013-03-29 — End: 2013-05-10

## 2013-03-30 ENCOUNTER — Ambulatory Visit: Payer: Medicaid Other | Admitting: Gastroenterology

## 2013-03-30 NOTE — ED Provider Notes (Signed)
Medical screening examination/treatment/procedure(s) were performed by non-physician practitioner and as supervising physician I was immediately available for consultation/collaboration.  Shon Baton, MD 03/30/13 1415

## 2013-03-31 ENCOUNTER — Telehealth: Payer: Self-pay | Admitting: Internal Medicine

## 2013-03-31 LAB — URINE CULTURE

## 2013-03-31 NOTE — Telephone Encounter (Signed)
DME Order for a Shower Stool has been faxed to [facility] [phone] and also Placed in DME order box to be picked up by [facility] Rep.

## 2013-04-01 ENCOUNTER — Telehealth: Payer: Self-pay | Admitting: Internal Medicine

## 2013-04-01 NOTE — Progress Notes (Signed)
ED Antimicrobial Stewardship Positive Culture Follow Up   Joshua Park is an 42 y.o. male who presented to [facility] on 03/28/2013 with a chief complaint of  Chief Complaint  Patient presents with  . Fever    Recent Results (from the past 720 hour(s))  CULTURE, BLOOD (ROUTINE X 2)     Status: None   Collection Time    03/28/13  8:55 PM      Result Value Range Status   Specimen Description BLOOD RIGHT ARM   Final   Special Requests BOTTLES DRAWN AEROBIC AND ANAEROBIC 3CC   Final   Culture  Setup Time     Final   Value: 03/29/2013 01:56     Performed at [facility]   Culture     Final   Value:        BLOOD CULTURE RECEIVED NO GROWTH TO DATE CULTURE WILL BE HELD FOR 5 DAYS BEFORE ISSUING A FINAL NEGATIVE REPORT     Performed at [facility]   Report Status PENDING   Incomplete  CULTURE, BLOOD (ROUTINE X 2)     Status: None   Collection Time    03/28/13  9:45 PM      Result Value Range Status   Specimen Description BLOOD LEFT ANTECUBITAL   Final   Special Requests NONE BOTTLES DRAWN AEROBIC AND ANAEROBIC 5CC   Final   Culture  Setup Time     Final   Value: 03/29/2013 01:57     Performed at [facility]   Culture     Final   Value:        BLOOD CULTURE RECEIVED NO GROWTH TO DATE CULTURE WILL BE HELD FOR 5 DAYS BEFORE ISSUING A FINAL NEGATIVE REPORT     Performed at [facility]   Report Status PENDING   Incomplete  URINE CULTURE     Status: None   Collection Time    03/28/13 11:23 PM      Result Value Range Status   Specimen Description URINE, CATHETERIZED   Final   Special Requests NONE   Final   Culture  Setup Time     Final   Value: 03/29/2013 05:38     Performed at [facility] Count     Final   Value: 70,000 COLONIES/ML     Performed at [facility]   Culture     Final   Value: ENTEROBACTER CLOACAE     Performed at [facility]   Report Status 03/31/2013 FINAL   Final   Organism ID, Bacteria  ENTEROBACTER CLOACAE   Final    [x]  Treated with ciprofloxacin, organism resistant to prescribed antimicrobial []  Patient discharged originally without antimicrobial agent and treatment is now indicated  New antibiotic prescription: Bactrim DS 1 tablet BID x 10 days  ED Provider: Pascal Lux Wingen PA-C   [name] 04/01/2013, 10:50 AM Infectious Diseases Pharmacist Phone# [phone]

## 2013-04-01 NOTE — Telephone Encounter (Signed)
Patient has now decided to go with [facility]  for his Shower Chair.   Order was cancelled with [facility].  Called [phone] and spoke with Rolan Bucco the Rep for this company the patient is requesting the DME order from.  Information has been verified with the Rep and the patient. Paperwork was faxed to [phone].  Rolan Bucco will call us back if anymore information is needed to process this order.

## 2013-04-04 LAB — CULTURE, BLOOD (ROUTINE X 2): Culture: NO GROWTH

## 2013-04-04 NOTE — ED Notes (Addendum)
Post ED Visit - Positive Culture Follow-up: Successful Patient Follow-Up  Culture assessed and recommendations reviewed by: []  Wes Dulaney, Pharm.D., BCPS [x]  Celedonio Miyamoto, Pharm.D., BCPS []  Georgina Pillion, Pharm.D., BCPS []  [place], [place].D., BCPS, AAHIVP []  Estella Husk, Pharm.D., BCPS, AAHIVP  Positive Urine culture  []  Patient discharged without antimicrobial prescription and treatment is now indicated [x]  Organism is resistant to prescribed ED discharge antimicrobial []  Patient with positive blood cultures  Changes discussed with ED provider:  Pascal Lux Wingen New antibiotic prescription Rx for Bactrim DS x 1 tablet BID x 10 days Patient informed of positive results and [phone] [facility] [address].  Larena Sox 04/04/2013, 5:52 PM

## 2013-04-07 ENCOUNTER — Other Ambulatory Visit: Payer: Self-pay | Admitting: *Deleted

## 2013-04-08 MED ORDER — OXYCODONE HCL 15 MG PO TABS: 15.0000 mg | ORAL_TABLET | Freq: Four times a day (QID) | ORAL | Status: AC | PRN

## 2013-04-27 ENCOUNTER — Other Ambulatory Visit: Payer: Self-pay | Admitting: Internal Medicine

## 2013-05-06 ENCOUNTER — Ambulatory Visit: Payer: Medicaid Other | Admitting: Internal Medicine

## 2013-05-06 ENCOUNTER — Other Ambulatory Visit: Payer: Self-pay | Admitting: Internal Medicine

## 2013-05-10 ENCOUNTER — Encounter: Payer: Self-pay | Admitting: Internal Medicine

## 2013-05-10 ENCOUNTER — Ambulatory Visit (INDEPENDENT_AMBULATORY_CARE_PROVIDER_SITE_OTHER): Payer: Medicaid Other | Admitting: Internal Medicine

## 2013-05-10 VITALS — BP 83/55 | HR 75 | Temp 97.5°F

## 2013-05-10 DIAGNOSIS — G8220 Paraplegia, unspecified: Secondary | ICD-10-CM

## 2013-05-10 DIAGNOSIS — G8929 Other chronic pain: Secondary | ICD-10-CM

## 2013-05-10 DIAGNOSIS — R300 Dysuria: Secondary | ICD-10-CM

## 2013-05-10 LAB — POCT URINALYSIS DIPSTICK
Blood, UA: NEGATIVE
Ketones, UA: NEGATIVE

## 2013-05-10 MED ORDER — SULFAMETHOXAZOLE-TMP DS 800-160 MG PO TABS: 1.0000 | ORAL_TABLET | Freq: Two times a day (BID) | ORAL | Status: DC

## 2013-05-10 MED ORDER — OXYCODONE-ACETAMINOPHEN 7.5-325 MG PO TABS: 1.0000 | ORAL_TABLET | ORAL | Status: DC | PRN

## 2013-05-10 MED ORDER — ENSURE PO LIQD: 237.0000 mL | Freq: Two times a day (BID) | ORAL | Status: DC

## 2013-05-10 MED ORDER — AMMONIUM LACTATE 12 % EX LOTN: 1.0000 "application " | TOPICAL_LOTION | CUTANEOUS | Status: DC | PRN

## 2013-05-10 MED ORDER — OXYCODONE HCL 7.5 MG PO TABS: 7.5000 mg | ORAL_TABLET | ORAL | Status: DC | PRN

## 2013-05-10 MED ORDER — OXYCODONE HCL 15 MG PO TABS: 7.5000 mg | ORAL_TABLET | ORAL | Status: DC | PRN

## 2013-05-10 NOTE — Patient Instructions (Signed)
You have a UTI and we will give you antibiotics called bactrim. Take 1 pill 2 times per day for 10 days.   We have also refilled your pain medicine and your creams.   We will have you come back and see your regular doctor when she is available to talk about your pain medicine.   Call our office with questions or problems before then at [phone].
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
## 2013-05-10 NOTE — Addendum Note (Signed)
Addended by: Genella Mech A on: 05/10/2013 01:35 PM   Modules accepted: Orders

## 2013-05-11 LAB — PRESCRIPTION ABUSE MONITORING 15P, URINE
Barbiturate Screen, Urine: NEGATIVE ng/mL
Buprenorphine, Urine: NEGATIVE ng/mL
Carisoprodol, Urine: NEGATIVE ng/mL
Fentanyl, Ur: NEGATIVE ng/mL
Meperidine, Ur: NEGATIVE ng/mL
Methadone Screen, Urine: NEGATIVE ng/mL
Opiate Screen, Urine: NEGATIVE ng/mL
Oxycodone Screen, Ur: NEGATIVE ng/mL
Propoxyphene: NEGATIVE ng/mL
Tramadol Scrn, Ur: NEGATIVE ng/mL

## 2013-05-11 NOTE — Progress Notes (Signed)
Case discussed with Dr. Kollar soon after the resident saw the patient.  We reviewed the resident's history and exam and pertinent patient test results.  I agree with the assessment, diagnosis, and plan of care documented in the resident's note. 

## 2013-05-12 LAB — URINE CULTURE
Colony Count: NO GROWTH
Organism ID, Bacteria: NO GROWTH

## 2013-05-13 LAB — COCAINE METABOLITE (GC/LC/MS), URINE: Benzoylecgonine GC/MS Conf: 459 ng/mL — ABNORMAL HIGH

## 2013-05-27 ENCOUNTER — Other Ambulatory Visit: Payer: Self-pay | Admitting: Internal Medicine

## 2013-05-30 ENCOUNTER — Other Ambulatory Visit: Payer: Self-pay | Admitting: Internal Medicine

## 2013-06-08 ENCOUNTER — Other Ambulatory Visit: Payer: Self-pay | Admitting: *Deleted

## 2013-06-08 MED ORDER — OXYCODONE HCL 15 MG PO TABS: 7.5000 mg | ORAL_TABLET | ORAL | Status: DC | PRN

## 2013-06-20 ENCOUNTER — Ambulatory Visit: Payer: Medicaid Other | Admitting: Internal Medicine

## 2013-06-23 ENCOUNTER — Encounter: Payer: Self-pay | Admitting: Internal Medicine

## 2013-06-23 ENCOUNTER — Ambulatory Visit (INDEPENDENT_AMBULATORY_CARE_PROVIDER_SITE_OTHER): Payer: Medicaid Other | Admitting: Internal Medicine

## 2013-06-23 VITALS — BP 87/58 | HR 72 | Temp 96.8°F

## 2013-06-23 DIAGNOSIS — N390 Urinary tract infection, site not specified: Secondary | ICD-10-CM

## 2013-06-23 MED ORDER — SULFAMETHOXAZOLE-TMP DS 800-160 MG PO TABS: 1.0000 | ORAL_TABLET | Freq: Two times a day (BID) | ORAL | Status: DC

## 2013-06-23 NOTE — Patient Instructions (Signed)
1. Please take 1 Bactrim DS 2 times per day for 10 days.   2. Please take all medications as prescribed.    3. If you have worsening of your symptoms or new symptoms arise, please call the clinic ([phone]), or go to the ER immediately if symptoms are severe.    Urinary Tract Infection Urinary tract infections (UTIs) can develop anywhere along your urinary tract. Your urinary tract is your body's drainage system for removing wastes and extra water. Your urinary tract includes two kidneys, two ureters, a bladder, and a urethra. Your kidneys are a pair of bean-shaped organs. Each kidney is about the size of your fist. They are located below your ribs, one on each side of your spine. CAUSES Infections are caused by microbes, which are microscopic organisms, including fungi, viruses, and bacteria. These organisms are so small that they can only be seen through a microscope. Bacteria are the microbes that most commonly cause UTIs. SYMPTOMS  Symptoms of UTIs may vary by age and gender of the patient and by the location of the infection. Symptoms in young women typically include a frequent and intense urge to urinate and a painful, burning feeling in the bladder or urethra during urination. Older women and men are more likely to be tired, shaky, and weak and have muscle aches and abdominal pain. A fever may mean the infection is in your kidneys. Other symptoms of a kidney infection include pain in your back or sides below the ribs, nausea, and vomiting. DIAGNOSIS To diagnose a UTI, your caregiver will ask you about your symptoms. Your caregiver also will ask to provide a urine sample. The urine sample will be tested for bacteria and white blood cells. White blood cells are made by your body to help fight infection. TREATMENT  Typically, UTIs can be treated with medication. Because most UTIs are caused by a bacterial infection, they usually can be treated with the use of antibiotics. The choice of  antibiotic and length of treatment depend on your symptoms and the type of bacteria causing your infection. HOME CARE INSTRUCTIONS  If you were prescribed antibiotics, take them exactly as your caregiver instructs you. Finish the medication even if you feel better after you have only taken some of the medication.  Drink enough water and fluids to keep your urine clear or pale yellow.  Avoid caffeine, tea, and carbonated beverages. They tend to irritate your bladder.  Empty your bladder often. Avoid holding urine for long periods of time.  Empty your bladder before and after sexual intercourse.  After a bowel movement, women should cleanse from front to back. Use each tissue only once. SEEK MEDICAL CARE IF:   You have back pain.  You develop a fever.  Your symptoms do not begin to resolve within 3 days. SEEK IMMEDIATE MEDICAL CARE IF:   You have severe back pain or lower abdominal pain.  You develop chills.  You have nausea or vomiting.  You have continued burning or discomfort with urination. MAKE SURE YOU:   Understand these instructions.  Will watch your condition.  Will get help right away if you are not doing well or get worse. Document Released: 04/02/2005 Document Revised: 12/23/2011 Document Reviewed: 08/01/2011 [facility] Patient Information 2014 [place], [place].
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
## 2013-06-24 LAB — URINALYSIS, ROUTINE W REFLEX MICROSCOPIC
Bilirubin Urine: NEGATIVE
Glucose, UA: NEGATIVE mg/dL
Hgb urine dipstick: NEGATIVE
Nitrite: NEGATIVE
Specific Gravity, Urine: 1.03 (ref 1.005–1.030)
pH: 5.5 (ref 5.0–8.0)

## 2013-06-24 LAB — URINALYSIS, MICROSCOPIC ONLY: Crystals: NONE SEEN

## 2013-06-24 NOTE — Assessment & Plan Note (Signed)
Assessment:  Dysuria and frequency in a patient who self-catheterizes consistent with UTI.  UA + small leukocytes, nitrite negative.  No fevers, N/V or CVAT concerning for pyelonephritis.  Plan:   1) Bactrim DS BID x 10 days for complicated UTI  2) Follow-up urine culture and change antibiotic if necessary

## 2013-06-25 LAB — URINE CULTURE
Colony Count: NO GROWTH
Organism ID, Bacteria: NO GROWTH

## 2013-07-11 ENCOUNTER — Other Ambulatory Visit: Payer: Self-pay | Admitting: *Deleted

## 2013-07-11 MED ORDER — BACLOFEN 10 MG PO TABS: 10.0000 mg | ORAL_TABLET | Freq: Three times a day (TID) | ORAL | Status: DC

## 2013-07-19 ENCOUNTER — Ambulatory Visit (INDEPENDENT_AMBULATORY_CARE_PROVIDER_SITE_OTHER): Payer: Medicaid Other | Admitting: Internal Medicine

## 2013-07-19 ENCOUNTER — Other Ambulatory Visit (HOSPITAL_COMMUNITY)
Admission: RE | Admit: 2013-07-19 | Discharge: 2013-07-19 | Disposition: A | Discharge status: Home / Self Care | Payer: Medicaid Other | Source: Ambulatory Visit | Attending: Internal Medicine | Admitting: Internal Medicine

## 2013-07-19 ENCOUNTER — Encounter: Payer: Self-pay | Admitting: Internal Medicine

## 2013-07-19 VITALS — BP 126/75 | HR 95 | Temp 98.6°F

## 2013-07-19 DIAGNOSIS — N390 Urinary tract infection, site not specified: Secondary | ICD-10-CM

## 2013-07-19 DIAGNOSIS — Z113 Encounter for screening for infections with a predominantly sexual mode of transmission: Secondary | ICD-10-CM | POA: Insufficient documentation

## 2013-07-19 LAB — POCT URINALYSIS DIPSTICK
BILIRUBIN UA: NEGATIVE
Glucose, UA: NEGATIVE
Ketones, UA: NEGATIVE
NITRITE UA: NEGATIVE
PH UA: 6.5
PROTEIN UA: NEGATIVE
RBC UA: NEGATIVE
Spec Grav, UA: >=1.03
UROBILINOGEN UA: 0.2

## 2013-07-19 MED ORDER — SULFAMETHOXAZOLE-TMP DS 800-160 MG PO TABS: 1.0000 | ORAL_TABLET | Freq: Two times a day (BID) | ORAL | Status: DC

## 2013-07-19 NOTE — Patient Instructions (Signed)
The most likely cause of all these urine infections is your bladder and the catheter. Therefore we will send you to your urologist to help manage this and decide whether you need long term antibiotics to help prevent them.   We will do a urine test and if we need to change your antibiotics we will let you know.   Have a happy new year and good luck with school !
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
## 2013-07-20 DIAGNOSIS — N390 Urinary tract infection, site not specified: Secondary | ICD-10-CM | POA: Insufficient documentation

## 2013-07-20 LAB — URINALYSIS, MICROSCOPIC ONLY
BACTERIA UA: NONE SEEN
Casts: NONE SEEN
Crystals: NONE SEEN
Squamous Epithelial / LPF: NONE SEEN

## 2013-07-20 LAB — PRESCRIPTION ABUSE MONITORING 15P, URINE
Amphetamine/Meth: NEGATIVE ng/mL
BENZODIAZEPINE SCREEN, URINE: NEGATIVE ng/mL
Barbiturate Screen, Urine: NEGATIVE ng/mL
Buprenorphine, Urine: NEGATIVE ng/mL
CANNABINOID SCRN UR: NEGATIVE ng/mL
CARISOPRODOL, URINE: NEGATIVE ng/mL
COCAINE METABOLITES: NEGATIVE ng/mL
Creatinine, Urine: 158.77 mg/dL (ref >20.0)
Fentanyl, Ur: NEGATIVE ng/mL
Meperidine, Ur: NEGATIVE ng/mL
Methadone Screen, Urine: NEGATIVE ng/mL
Propoxyphene: NEGATIVE ng/mL
TRAMADOL UR: NEGATIVE ng/mL
Zolpidem, Urine: NEGATIVE ng/mL

## 2013-07-20 LAB — URINALYSIS, ROUTINE W REFLEX MICROSCOPIC
BILIRUBIN URINE: NEGATIVE
GLUCOSE, UA: NEGATIVE mg/dL
HGB URINE DIPSTICK: NEGATIVE
Ketones, ur: NEGATIVE mg/dL
Nitrite: NEGATIVE
PH: 6.5 (ref 5.0–8.0)
Protein, ur: NEGATIVE mg/dL
Specific Gravity, Urine: 1.021 (ref 1.005–1.030)
Urobilinogen, UA: 0.2 mg/dL (ref 0.0–1.0)

## 2013-07-20 LAB — URINE CULTURE
Colony Count: NO GROWTH
Organism ID, Bacteria: NO GROWTH

## 2013-07-20 NOTE — Assessment & Plan Note (Signed)
Urine dip unrevealing with just + leuk. Review pt has been having recurrent UTIs with same E. Cloacae that sensitive to Bactrim and only on one occasion E. Coli R to augmentin. Pt has hx of recurrent infection and may need chronic suppressive therapy given dependence on I&O and appears to be colonized. Some concern that since completed last Bactrim and didn't feel full resolution maybe another speciation. No systemic signs or symptoms of pyelonephritis. Will r/o STDs and pt on pain contract will complete for the year as well.  -UA, UCx -GC/chlamydia -UDS -Bactrim DS x 14 days -urology referral

## 2013-07-22 LAB — OPIATES/OPIOIDS (LC/MS-MS)
Codeine Urine: NEGATIVE ng/mL
Heroin (6-AM), UR: NEGATIVE ng/mL
Hydrocodone: NEGATIVE ng/mL
Hydromorphone: NEGATIVE ng/mL
Morphine Urine: NEGATIVE ng/mL
Norhydrocodone, Ur: NEGATIVE ng/mL
Noroxycodone, Ur: >2500 ng/mL — ABNORMAL HIGH
Oxycodone, ur: 3704 ng/mL — ABNORMAL HIGH
Oxymorphone: 2583 ng/mL — ABNORMAL HIGH

## 2013-07-22 LAB — OXYCODONE, URINE (LC/MS-MS)
Oxycodone, ur: 3704 ng/mL — ABNORMAL HIGH
Oxymorphone: 2583 ng/mL — ABNORMAL HIGH

## 2013-08-01 ENCOUNTER — Encounter: Payer: Self-pay | Admitting: Internal Medicine

## 2013-08-01 ENCOUNTER — Ambulatory Visit (INDEPENDENT_AMBULATORY_CARE_PROVIDER_SITE_OTHER): Payer: Medicaid Other | Admitting: Internal Medicine

## 2013-08-01 ENCOUNTER — Telehealth: Payer: Self-pay | Admitting: *Deleted

## 2013-08-01 ENCOUNTER — Ambulatory Visit: Payer: Medicaid Other | Admitting: Internal Medicine

## 2013-08-01 VITALS — BP 104/71 | HR 86 | Temp 97.5°F

## 2013-08-01 DIAGNOSIS — X131XXA Other contact with steam and other hot vapors, initial encounter: Secondary | ICD-10-CM

## 2013-08-01 DIAGNOSIS — T300 Burn of unspecified body region, unspecified degree: Secondary | ICD-10-CM

## 2013-08-01 DIAGNOSIS — X12XXXA Contact with other hot fluids, initial encounter: Secondary | ICD-10-CM

## 2013-08-01 MED ORDER — SILVER SULFADIAZINE 1 % EX CREA: TOPICAL_CREAM | CUTANEOUS | Status: DC
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
## 2013-08-01 NOTE — Patient Instructions (Signed)
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
## 2013-08-02 DIAGNOSIS — T300 Burn of unspecified body region, unspecified degree: Secondary | ICD-10-CM | POA: Insufficient documentation
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
## 2013-08-02 NOTE — Progress Notes (Signed)
Case discussed with Dr. Sadek soon after the resident saw the patient.  We reviewed the resident's history and exam and pertinent patient test results.  I agree with the assessment, diagnosis, and plan of care documented in the resident's note. 

## 2013-08-04 NOTE — Progress Notes (Signed)
Silvadene may not be necessary for the entire course of treatment, but can be used as a prophylaxis against infection.  If not improving as expected, will send to wound care.

## 2013-08-04 NOTE — Progress Notes (Signed)
Case discussed with Dr. Sadek soon after the resident saw the patient.  We reviewed the resident's history and exam and pertinent patient test results.  I agree with the assessment, diagnosis, and plan of care documented in the resident's note. 

## 2013-08-11 NOTE — Progress Notes (Signed)
I saw and evaluated the patient.  I personally confirmed the key portions of the history and exam documented by Dr. Wilson and I reviewed pertinent patient test results.  The assessment, diagnosis, and plan were formulated together and I agree with the documentation in the resident's note. 

## 2013-08-17 NOTE — Addendum Note (Signed)
Addended by: Hulan Fray on: 08/17/2013 06:22 PM   Modules accepted: Orders

## 2013-08-18 ENCOUNTER — Encounter: Payer: Self-pay | Admitting: Internal Medicine

## 2013-08-18 ENCOUNTER — Ambulatory Visit (INDEPENDENT_AMBULATORY_CARE_PROVIDER_SITE_OTHER): Payer: Medicaid Other | Admitting: Internal Medicine

## 2013-08-18 VITALS — BP 118/72 | HR 81 | Temp 97.5°F

## 2013-08-18 DIAGNOSIS — N390 Urinary tract infection, site not specified: Secondary | ICD-10-CM

## 2013-08-18 DIAGNOSIS — X58XXXA Exposure to other specified factors, initial encounter: Secondary | ICD-10-CM

## 2013-08-18 DIAGNOSIS — T300 Burn of unspecified body region, unspecified degree: Secondary | ICD-10-CM
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
## 2013-08-18 NOTE — Assessment & Plan Note (Signed)
Patient's VVS, afebrile. He has subjective chills and pain at his urethral meatus. His urine dipstick did not reveal UTI. Patient has been treated for UTI many times in the past. However, the last three UCxs have been no growth, therefore, I am not inclined to treat this patient given there is no clear infection. I also do not want to increase patient's risk of developing antibiotic resistance. Patient may still have a UTI, so we will further investigate by sending a UA and UCx. I will follow up on this and treat patient accordingly. He has been referred to urology in the past, but did not follow up with them 2/2 some miscommunication. My nurse called urology today and scheduled an appointment for patient. He intends to follow up this time. I asked patient to return to clinic if his symptoms do not improve or if they worsen over the next week.
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
## 2013-08-19 LAB — URINALYSIS, ROUTINE W REFLEX MICROSCOPIC
Bilirubin Urine: NEGATIVE
Glucose, UA: NEGATIVE mg/dL
Hgb urine dipstick: NEGATIVE
Ketones, ur: NEGATIVE mg/dL
Nitrite: NEGATIVE
PROTEIN: NEGATIVE mg/dL
Specific Gravity, Urine: 1.029 (ref 1.005–1.030)
Urobilinogen, UA: 1 mg/dL (ref 0.0–1.0)
pH: 5.5 (ref 5.0–8.0)

## 2013-08-19 LAB — URINALYSIS, MICROSCOPIC ONLY
Bacteria, UA: NONE SEEN
Casts: NONE SEEN
Crystals: NONE SEEN

## 2013-08-19 LAB — URINE CULTURE
COLONY COUNT: NO GROWTH
Organism ID, Bacteria: NO GROWTH

## 2013-08-22 ENCOUNTER — Telehealth: Payer: Self-pay | Admitting: Internal Medicine

## 2013-08-22 NOTE — Telephone Encounter (Signed)
I called patient and discussed with him that per the urine culture done at his last visit, he does not have a UTI. I reinforced that he needs to be seen and evaluated by urology. He said he has not yet been called by urology or our clinic with an appointment time.   I asked my nurse to call urology to schedule an appointment for patient. Urology said they would like patient to call himself to schedule an appointment as he was a "no show" at his last visit. My nurse called patient and informed him of this. Patient plans to call to schedule an appointment with urology.

## 2013-08-24 ENCOUNTER — Telehealth: Payer: Self-pay | Admitting: *Deleted

## 2013-08-24 NOTE — Telephone Encounter (Signed)
Pt has an appt at [facility] on Feb 26th @ 1300PM w/Dr Karsten Ro.

## 2013-08-24 NOTE — Telephone Encounter (Signed)
[facility] to f/u referral.  Appt has been made for Feb 26th at 1pm.  Attempted to contact pt by phone, no answer, appt info left on recorder.  Will also mail appt to patient.Despina Hidden Cassady2/18/20153:24 PM

## 2013-09-05 ENCOUNTER — Other Ambulatory Visit: Payer: Self-pay | Admitting: *Deleted

## 2013-09-05 MED ORDER — OXYCODONE HCL 15 MG PO TABS: 7.5000 mg | ORAL_TABLET | ORAL | Status: DC | PRN

## 2013-09-16 ENCOUNTER — Other Ambulatory Visit: Payer: Self-pay | Admitting: *Deleted

## 2013-09-16 MED ORDER — GABAPENTIN 300 MG PO CAPS: 600.0000 mg | ORAL_CAPSULE | Freq: Three times a day (TID) | ORAL | Status: DC

## 2013-09-20 ENCOUNTER — Other Ambulatory Visit: Payer: Self-pay | Admitting: *Deleted

## 2013-09-20 MED ORDER — BACLOFEN 10 MG PO TABS: 10.0000 mg | ORAL_TABLET | Freq: Three times a day (TID) | ORAL | Status: DC

## 2013-09-20 NOTE — Telephone Encounter (Signed)
Also needs Ensure rx (hard copy) faxed to his social worker, Ms Ronnald Ramp [facility]. Fax# is [phone].  So, he can get his Ensure. Thanks

## 2013-09-23 NOTE — Telephone Encounter (Signed)
Dr Ellwood Dense I need rx for the rx Ensure to fax to pt's social worker. Thanks

## 2013-09-26 ENCOUNTER — Other Ambulatory Visit: Payer: Self-pay | Admitting: *Deleted

## 2013-09-27 ENCOUNTER — Encounter: Payer: Self-pay | Admitting: Internal Medicine

## 2013-09-27 ENCOUNTER — Ambulatory Visit (INDEPENDENT_AMBULATORY_CARE_PROVIDER_SITE_OTHER): Payer: Medicaid Other | Admitting: Internal Medicine

## 2013-09-27 VITALS — BP 97/66 | HR 78 | Temp 97.2°F

## 2013-09-27 DIAGNOSIS — R6882 Decreased libido: Secondary | ICD-10-CM

## 2013-09-27 DIAGNOSIS — N319 Neuromuscular dysfunction of bladder, unspecified: Secondary | ICD-10-CM

## 2013-09-27 DIAGNOSIS — G8929 Other chronic pain: Secondary | ICD-10-CM

## 2013-09-27 MED ORDER — OXYCODONE HCL 15 MG PO TABS: 7.5000 mg | ORAL_TABLET | ORAL | Status: DC | PRN

## 2013-09-27 MED ORDER — ENSURE PO LIQD: 237.0000 mL | Freq: Two times a day (BID) | ORAL | Status: DC

## 2013-09-27 MED ORDER — TADALAFIL 5 MG PO TABS: ORAL_TABLET | ORAL | Status: DC

## 2013-09-27 MED ORDER — OXYCODONE HCL 15 MG PO TABS
7.5000 mg | ORAL_TABLET | ORAL | Status: DC | PRN
Start: 2013-09-27 — End: 2013-09-27

## 2013-09-27 NOTE — Patient Instructions (Signed)
Please call if you have other issues.  Please bring your medicines with you each time you come.   Medicines may be  Eye drops  Herbal   Vitamins  Pills  Seeing these help [place] take care of you.
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
## 2013-09-27 NOTE — Assessment & Plan Note (Signed)
Doing well with cialis 5mg  PRN - Refill Cialis 5mg  #4

## 2013-09-27 NOTE — Assessment & Plan Note (Signed)
Patient has seen Urology and doing much better, will attempt to get records.

## 2013-09-27 NOTE — Assessment & Plan Note (Signed)
No red flags.  Taking medicine appropriately. - Refill Oxycodone, Printed 3 months of Rx and given to patient as is difficult for him to come to clinic to pick up each month.  Advised we would not be able to reprint Rx if lost or stolen.

## 2013-09-28 NOTE — Telephone Encounter (Signed)
New Ensure rx written by Dr Heber [place] yesterday at pt's office visit per [facility].

## 2013-09-29 NOTE — Telephone Encounter (Signed)
Thank you :)

## 2013-09-30 NOTE — Progress Notes (Signed)
Case discussed with Dr. Hoffman at the time of the visit.  We reviewed the resident's history and exam and pertinent patient test results.  I agree with the assessment, diagnosis, and plan of care documented in the resident's note. 

## 2013-11-11 ENCOUNTER — Other Ambulatory Visit: Payer: Self-pay | Admitting: *Deleted

## 2013-11-14 MED ORDER — BACLOFEN 10 MG PO TABS: 10.0000 mg | ORAL_TABLET | Freq: Three times a day (TID) | ORAL | Status: DC

## 2013-11-15 ENCOUNTER — Encounter: Payer: Self-pay | Admitting: Internal Medicine

## 2013-11-15 ENCOUNTER — Ambulatory Visit (INDEPENDENT_AMBULATORY_CARE_PROVIDER_SITE_OTHER): Payer: Medicaid Other | Admitting: Internal Medicine

## 2013-11-15 ENCOUNTER — Other Ambulatory Visit (HOSPITAL_COMMUNITY)
Admission: RE | Admit: 2013-11-15 | Discharge: 2013-11-15 | Disposition: A | Discharge status: Home / Self Care | Payer: Medicaid Other | Source: Ambulatory Visit | Attending: Internal Medicine | Admitting: Internal Medicine

## 2013-11-15 VITALS — BP 114/72 | HR 70 | Temp 97.8°F

## 2013-11-15 DIAGNOSIS — Z113 Encounter for screening for infections with a predominantly sexual mode of transmission: Secondary | ICD-10-CM | POA: Insufficient documentation

## 2013-11-15 DIAGNOSIS — R369 Urethral discharge, unspecified: Secondary | ICD-10-CM | POA: Insufficient documentation

## 2013-11-15 DIAGNOSIS — G8929 Other chronic pain: Secondary | ICD-10-CM

## 2013-11-15 MED ORDER — BACLOFEN 5 MG HALF TABLET: 15.0000 mg | ORAL_TABLET | Freq: Three times a day (TID) | ORAL | Status: DC

## 2013-11-15 NOTE — Patient Instructions (Addendum)
General Instructions: For your Urethral Discharge, we are checking a urinalysis today, as well as sending the other labs we discussed.  We will contact you if any of these labs are positive.  For your chronic back pain, your back appears to have a large muscle spasm.  We are increasing your Baclofen to 15 mg, three times per day.  Please return for a follow-up visit in 1-2 months, preferably with your PCP, Dr. Ellwood Dense.  Please bring your medicines with you each time you come to clinic.  Medicines may include prescription medications, over-the-counter medications, herbal remedies, eye drops, vitamins, or other pills.   Progress Toward Treatment Goals:  No flowsheet data found.  Self Care Goals & Plans:  No flowsheet data found.  No flowsheet data found.   Care Management & Community Referrals:  No flowsheet data found.

## 2013-11-15 NOTE — Assessment & Plan Note (Signed)
The patient notes a history of chronic back pain.  He currently has a pain contract for Oxycodone.  He asks whether we can increase his dose of oxycodone.  As the patient's pain is chronic in nature, I believe the timing of increases in narcotic pain medications, or the consideration of referral to a pain clinic, is best left up to a careful discussion between the patient and his PCP.  Given presence of back spasm, we will try an increased dose of Baclofen today. -increase baclofen from 10 to 15 mg TID

## 2013-11-15 NOTE — Assessment & Plan Note (Addendum)
The patient notes recurrent urethral discharge, associated with penile pain, which he interprets to represent UTI's.  Symptoms come and go in episodes, and tend to resolve with antibiotics.  Differential includes UTI (would explain pain, but less likely discharge) vs GC/chlamydia (history of same).  Less likely prostatitis (several negative urine cultures in past), but something to consider. -check UA, urine culture, urine GC/chlamydia -encouraged patient to follow-up with his Urologist  Addendum 5/14: GC/Chlamydia negative.  UA showed pyuria (21-50 WBC's), though culture shows no growth.  The last few urine samples have shown similar results.  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  To treat this, we will prescribe Cipro 500 mg BID x6 weeks, and Flomax 0.4 mg qhs x6 weeks.  Patient is to follow-up in 6 weeks for re-assessment.
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
## 2013-11-16 LAB — URINALYSIS, COMPLETE
Bacteria, UA: NONE SEEN
CRYSTALS: NONE SEEN
Casts: NONE SEEN
Glucose, UA: NEGATIVE mg/dL
Hgb urine dipstick: NEGATIVE
Ketones, ur: NEGATIVE mg/dL
NITRITE: NEGATIVE
Protein, ur: NEGATIVE mg/dL
UROBILINOGEN UA: 0.2 mg/dL (ref 0.0–1.0)
pH: 5 (ref 5.0–8.0)

## 2013-11-16 LAB — URINE CYTOLOGY ANCILLARY ONLY
Chlamydia: NEGATIVE
NEISSERIA GONORRHEA: NEGATIVE

## 2013-11-16 LAB — URINE CULTURE
Colony Count: NO GROWTH
Organism ID, Bacteria: NO GROWTH

## 2013-11-17 MED ORDER — TAMSULOSIN HCL 0.4 MG PO CAPS: 0.4000 mg | ORAL_CAPSULE | Freq: Every day | ORAL | Status: DC

## 2013-11-17 MED ORDER — CIPROFLOXACIN HCL 500 MG PO TABS
500.0000 mg | ORAL_TABLET | Freq: Two times a day (BID) | ORAL | Status: AC
Start: 2013-11-17 — End: 2013-12-29

## 2013-11-17 NOTE — Addendum Note (Signed)
Addended by: Hester Mates on: 11/17/2013 05:13 PM   Modules accepted: Orders

## 2013-11-17 NOTE — Progress Notes (Signed)
Case discussed with Dr. Owens Shark at time of visit.  We reviewed the resident's history and exam and pertinent patient test results.  I agree with the assessment, diagnosis, and plan of care documented in the resident's note.

## 2013-12-12 ENCOUNTER — Ambulatory Visit: Payer: Medicaid Other | Admitting: Internal Medicine

## 2013-12-13 ENCOUNTER — Other Ambulatory Visit: Payer: Self-pay | Admitting: Internal Medicine

## 2013-12-19 ENCOUNTER — Other Ambulatory Visit: Payer: Self-pay | Admitting: Internal Medicine

## 2014-01-03 ENCOUNTER — Other Ambulatory Visit: Payer: Self-pay | Admitting: *Deleted

## 2014-01-03 NOTE — Telephone Encounter (Signed)
Last refill 12/05/13 per pharmacy Pt # [phone]

## 2014-01-04 MED ORDER — OXYCODONE HCL 15 MG PO TABS: 7.5000 mg | ORAL_TABLET | ORAL | Status: DC | PRN

## 2014-01-04 NOTE — Telephone Encounter (Signed)
Pt informed Rx is ready 

## 2014-01-10 ENCOUNTER — Other Ambulatory Visit: Payer: Self-pay | Admitting: *Deleted

## 2014-01-11 MED ORDER — TADALAFIL 5 MG PO TABS: ORAL_TABLET | ORAL | Status: DC

## 2014-01-23 ENCOUNTER — Other Ambulatory Visit: Payer: Self-pay | Admitting: Internal Medicine

## 2014-02-02 ENCOUNTER — Other Ambulatory Visit: Payer: Self-pay | Admitting: *Deleted

## 2014-02-02 NOTE — Telephone Encounter (Signed)
Last filled 7/1 Call when ready @ [phone]

## 2014-02-03 MED ORDER — OXYCODONE HCL 15 MG PO TABS: 7.5000 mg | ORAL_TABLET | ORAL | Status: DC | PRN

## 2014-02-06 NOTE — Telephone Encounter (Signed)
Pt called and informed Rx is ready

## 2014-02-27 ENCOUNTER — Other Ambulatory Visit: Payer: Self-pay | Admitting: Internal Medicine

## 2014-03-06 ENCOUNTER — Other Ambulatory Visit: Payer: Self-pay | Admitting: *Deleted

## 2014-03-07 MED ORDER — OXYCODONE HCL 15 MG PO TABS: 7.5000 mg | ORAL_TABLET | ORAL | Status: DC | PRN

## 2014-03-17 ENCOUNTER — Ambulatory Visit: Payer: Medicaid Other | Admitting: Internal Medicine

## 2014-03-21 ENCOUNTER — Ambulatory Visit (HOSPITAL_COMMUNITY)
Admission: RE | Admit: 2014-03-21 | Discharge: 2014-03-21 | Disposition: A | Discharge status: Home / Self Care | Payer: Medicaid Other | Source: Ambulatory Visit | Attending: Internal Medicine | Admitting: Internal Medicine

## 2014-03-21 ENCOUNTER — Ambulatory Visit (INDEPENDENT_AMBULATORY_CARE_PROVIDER_SITE_OTHER): Payer: Medicaid Other | Admitting: Internal Medicine

## 2014-03-21 ENCOUNTER — Encounter: Payer: Self-pay | Admitting: Internal Medicine

## 2014-03-21 VITALS — BP 108/67 | HR 62 | Temp 97.8°F

## 2014-03-21 DIAGNOSIS — R002 Palpitations: Secondary | ICD-10-CM | POA: Diagnosis present

## 2014-03-21 DIAGNOSIS — N390 Urinary tract infection, site not specified: Secondary | ICD-10-CM

## 2014-03-21 DIAGNOSIS — K219 Gastro-esophageal reflux disease without esophagitis: Secondary | ICD-10-CM

## 2014-03-21 DIAGNOSIS — R000 Tachycardia, unspecified: Secondary | ICD-10-CM

## 2014-03-21 LAB — POCT URINALYSIS DIPSTICK
BILIRUBIN UA: NEGATIVE
Blood, UA: NEGATIVE
Glucose, UA: NEGATIVE
KETONES UA: NEGATIVE
Leukocytes, UA: NEGATIVE
Nitrite, UA: NEGATIVE
Protein, UA: NEGATIVE
Spec Grav, UA: 1.025
Urobilinogen, UA: 0.2
pH, UA: 5.5

## 2014-03-21 MED ORDER — CIPROFLOXACIN HCL 500 MG PO TABS: 500.0000 mg | ORAL_TABLET | Freq: Two times a day (BID) | ORAL | Status: AC

## 2014-03-21 MED ORDER — PANTOPRAZOLE SODIUM 40 MG PO TBEC: 40.0000 mg | DELAYED_RELEASE_TABLET | Freq: Every day | ORAL | Status: DC

## 2014-03-21 MED ORDER — TAMSULOSIN HCL 0.4 MG PO CAPS: 0.4000 mg | ORAL_CAPSULE | Freq: Every day | ORAL | Status: DC

## 2014-03-21 NOTE — Assessment & Plan Note (Signed)
Has palpations occasionally at night. Patient doesn't drink any water. Likely 2/2 to dehydration.  Checked EKG - only some mild TW flattening but mostly unchanged from previous. checked TSH. Asked patient to increase water intake and monitor for now.

## 2014-03-21 NOTE — Patient Instructions (Addendum)
We will check your urine for UTI. Will start you on ciprofloxacin 500mg  tablet twice daily for 7 days. Will change if needed based on urine study results.  We will refer you to urology. Please bring up your scrotal pain with the urologist too. Continue flomax for now - refilled.  Your EKG looks ok.. Drink 6 glasses of water at least daily. You may be dehydrated.   Follow up as needed.

## 2014-03-21 NOTE — Assessment & Plan Note (Signed)
Has some sour taste and burping after eating.  Will treat for GERD with protonix 40mg  daily.
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
## 2014-03-21 NOTE — Progress Notes (Signed)
Patient ID: Joshua Park, male   DOB: 04-Feb-1971, 43 y.o.   MRN: [id number]  I saw and evaluated the patient.  I personally confirmed the key portions of the history and exam documented by Dr. Genene Churn and I reviewed pertinent patient test results.  The assessment, diagnosis, and plan were formulated together and I agree with the documentation in the resident's note.

## 2014-03-21 NOTE — Assessment & Plan Note (Signed)
Has chronic recurrence of UTI. Has risk of self craterization from neurogenic bladder. No discharge currently. Was treated in the past with cipro for 6 weeks and flomax.  Urine dipstick normal but UA and Ucx also ordered and pending.  Will treat with cipro 500mg  BID for 7 days for complicated UTI. Resume flomax  Will refer to urology. F/up in 1 month.

## 2014-03-22 LAB — URINALYSIS, ROUTINE W REFLEX MICROSCOPIC
GLUCOSE, UA: NEGATIVE mg/dL
Hgb urine dipstick: NEGATIVE
KETONES UR: NEGATIVE mg/dL
Leukocytes, UA: NEGATIVE
Nitrite: NEGATIVE
Protein, ur: NEGATIVE mg/dL
Urobilinogen, UA: 0.2 mg/dL (ref 0.0–1.0)
pH: 5.5 (ref 5.0–8.0)

## 2014-03-22 LAB — TSH: TSH: 0.835 u[IU]/mL (ref 0.350–4.500)

## 2014-03-23 LAB — URINE CULTURE

## 2014-03-29 ENCOUNTER — Other Ambulatory Visit: Payer: Self-pay | Admitting: Internal Medicine

## 2014-04-05 ENCOUNTER — Other Ambulatory Visit: Payer: Self-pay | Admitting: *Deleted

## 2014-04-05 MED ORDER — OXYCODONE HCL 15 MG PO TABS: 7.5000 mg | ORAL_TABLET | ORAL | Status: DC | PRN

## 2014-04-05 NOTE — Telephone Encounter (Signed)
Pt informed Rx is ready 

## 2014-04-05 NOTE — Telephone Encounter (Signed)
Last refill 9/1 Call pt when ready # [phone]

## 2014-04-24 ENCOUNTER — Other Ambulatory Visit: Payer: Self-pay | Admitting: Internal Medicine

## 2014-05-03 ENCOUNTER — Ambulatory Visit: Payer: Medicaid Other | Admitting: Internal Medicine

## 2014-05-04 ENCOUNTER — Ambulatory Visit (INDEPENDENT_AMBULATORY_CARE_PROVIDER_SITE_OTHER): Payer: Medicaid Other | Admitting: Internal Medicine

## 2014-05-04 VITALS — BP 100/65 | HR 91 | Temp 98.0°F

## 2014-05-04 DIAGNOSIS — G8929 Other chronic pain: Secondary | ICD-10-CM

## 2014-05-04 DIAGNOSIS — L89322 Pressure ulcer of left buttock, stage 2: Secondary | ICD-10-CM

## 2014-05-04 DIAGNOSIS — G8220 Paraplegia, unspecified: Secondary | ICD-10-CM

## 2014-05-04 DIAGNOSIS — G894 Chronic pain syndrome: Secondary | ICD-10-CM

## 2014-05-04 DIAGNOSIS — L8992 Pressure ulcer of unspecified site, stage 2: Secondary | ICD-10-CM

## 2014-05-04 MED ORDER — OXYCODONE HCL 15 MG PO TABS: 7.5000 mg | ORAL_TABLET | ORAL | Status: DC | PRN

## 2014-05-04 NOTE — Patient Instructions (Addendum)
General Instructions: I will order a home nurse for you.  Be sure to turn regularly to relieve pressure from the wound area I will put in an order for a pump for your roho cushion  Please come back to see Dr Ellwood Dense in 2-3 months    Please bring your medicines with you each time you come to clinic.  Medicines may include prescription medications, over-the-counter medications, herbal remedies, eye drops, vitamins, or other pills.   Progress Toward Treatment Goals:  No flowsheet data found.  Self Care Goals & Plans:  Self Care Goal 03/21/2014  Manage my medications take my medicines as prescribed; bring my medications to every visit; refill my medications on time  Stop smoking set a quit date and stop smoking    No flowsheet data found.   Care Management & Community Referrals:  No flowsheet data found.     Wound Care Wound care helps prevent pain and infection.  You may need a tetanus shot if:  You cannot remember when you had your last tetanus shot.  You have never had a tetanus shot.  The injury broke your skin. If you need a tetanus shot and you choose not to have one, you may get tetanus. Sickness from tetanus can be serious. HOME CARE   Only take medicine as told by your doctor.  Clean the wound daily with mild soap and water.  Change any bandages (dressings) as told by your doctor.  Put medicated cream and a bandage on the wound as told by your doctor.  Change the bandage if it gets wet, dirty, or starts to smell.  Take showers. Do not take baths, swim, or do anything that puts your wound under water.  Rest and raise (elevate) the wound until the pain and puffiness (swelling) are better.  Keep all doctor visits as told. GET HELP RIGHT AWAY IF:   Yellowish-white fluid (pus) comes from the wound.  Medicine does not lessen your pain.  There is a red streak going away from the wound.  You have a fever. MAKE SURE YOU:   Understand these  instructions.  Will watch your condition.  Will get help right away if you are not doing well or get worse. Document Released: 04/01/2008 Document Revised: 09/15/2011 Document Reviewed: 10/27/2010 [facility] Patient Information 2015 [place], [place]. This information is not intended to replace advice given to you by your health care provider. Make sure you discuss any questions you have with your health care provider.

## 2014-05-05 NOTE — Assessment & Plan Note (Addendum)
The wound does not appear infected. Discussed with the patient the necessity of regular turning to relieve pressure from the wound. Has HH RN only once a month. Plan  - will ask HHRN to come at least x2/week until wound is better - will put in an order for Roho cushion pump  - discussed prevention of pressure ulcers - instructed patient to come back if he develops fevers/chills increased fatigue or if notices drainage from the wound.  - will monitor clinically as needed.
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
## 2014-05-05 NOTE — Assessment & Plan Note (Signed)
Refilled Oxycodone. Handed him 2 rxs. Next refill should be after 07/05/2014. He would like to discuss his current regimen with his PCP.

## 2014-05-10 NOTE — Progress Notes (Signed)
Case discussed with Dr. Kazibwe soon after the resident saw the patient.  We reviewed the resident's history and exam and pertinent patient test results.  I agree with the assessment, diagnosis and plan of care documented in the resident's note. 

## 2014-05-11 ENCOUNTER — Telehealth: Payer: Self-pay | Admitting: Internal Medicine

## 2014-05-11 NOTE — Telephone Encounter (Signed)
I have received documentation from [facility] lab about a urine culture.  Date of collection: 05/03/14 Specimen collected from catheter Result: 30,000 colonies of Ecoli, lactobacillus species.  Sensitive to Augmentin (8), nitrofurantoin (16), Bactrim (20) among oral medications.  I have tried Joshua Park phone multiple times to enquire if he is having any symptoms, but I am not able to reach him. I have been told by triage that he had the urine test done because his urine looked cloudy. He has no fever or abdominal pain. If that is the case, I would refrain from giving antibiotics because Joshua Park is dependent on self catheterization and has frequent UTIs and this result could be due to colonization. However if he has fever, abdominal pain or urinary irritation, I am happy to prescribe Augmentin. I will keep trying to reach him. The results have been scanned under media tab.

## 2014-05-16 NOTE — Telephone Encounter (Signed)
Triage also could not speak w/ pt when called, called his [facility] and ask her to have him call clinic if he has any problems this week

## 2014-05-23 ENCOUNTER — Ambulatory Visit (INDEPENDENT_AMBULATORY_CARE_PROVIDER_SITE_OTHER): Payer: Medicaid Other | Admitting: Internal Medicine

## 2014-05-23 ENCOUNTER — Encounter: Payer: Self-pay | Admitting: Internal Medicine

## 2014-05-23 VITALS — BP 94/68 | HR 77 | Temp 97.7°F

## 2014-05-23 DIAGNOSIS — N390 Urinary tract infection, site not specified: Secondary | ICD-10-CM

## 2014-05-23 DIAGNOSIS — L8992 Pressure ulcer of unspecified site, stage 2: Secondary | ICD-10-CM

## 2014-05-23 DIAGNOSIS — G8929 Other chronic pain: Secondary | ICD-10-CM

## 2014-05-23 DIAGNOSIS — R110 Nausea: Secondary | ICD-10-CM

## 2014-05-23 LAB — CBC WITH DIFFERENTIAL/PLATELET
BASOS ABS: 0 10*3/uL (ref 0.0–0.1)
BASOS PCT: 1 % (ref 0–1)
EOS PCT: 1 % (ref 0–5)
Eosinophils Absolute: 0 10*3/uL (ref 0.0–0.7)
HCT: 41.6 % (ref 39.0–52.0)
Hemoglobin: 14.2 g/dL (ref 13.0–17.0)
Lymphocytes Relative: 38 % (ref 12–46)
Lymphs Abs: 1.5 10*3/uL (ref 0.7–4.0)
MCH: 30.6 pg (ref 26.0–34.0)
MCHC: 34.1 g/dL (ref 30.0–36.0)
MCV: 89.7 fL (ref 78.0–100.0)
MPV: 10.4 fL (ref 9.4–12.4)
Monocytes Absolute: 0.4 10*3/uL (ref 0.1–1.0)
Monocytes Relative: 9 % (ref 3–12)
NEUTROS ABS: 2 10*3/uL (ref 1.7–7.7)
Neutrophils Relative %: 51 % (ref 43–77)
PLATELETS: 186 10*3/uL (ref 150–400)
RBC: 4.64 MIL/uL (ref 4.22–5.81)
RDW: 13.6 % (ref 11.5–15.5)
WBC: 3.9 10*3/uL — ABNORMAL LOW (ref 4.0–10.5)

## 2014-05-23 LAB — POCT URINALYSIS DIPSTICK
Bilirubin, UA: NEGATIVE
Blood, UA: NEGATIVE
GLUCOSE UA: NEGATIVE
KETONES UA: NEGATIVE
PH UA: 6.5
Spec Grav, UA: >=1.03
UROBILINOGEN UA: 1

## 2014-05-23 MED ORDER — PROMETHAZINE HCL 25 MG PO TABS: 25.0000 mg | ORAL_TABLET | Freq: Four times a day (QID) | ORAL | Status: DC | PRN

## 2014-05-23 MED ORDER — BACLOFEN 10 MG PO TABS: ORAL_TABLET | ORAL | Status: DC

## 2014-05-23 MED ORDER — AMOXICILLIN-POT CLAVULANATE 875-125 MG PO TABS: 1.0000 | ORAL_TABLET | Freq: Two times a day (BID) | ORAL | Status: DC

## 2014-05-23 NOTE — Patient Instructions (Signed)
Joshua Park,    I tried to reach you when I received your urine culture report but because of the changed number, I was unable to. Since you have been feeling miserable for the past 2-3 weeks, I presume it might be because of the untreated urinary tract infection. I will prescribe an antibiotic for that. I will also repeat tests on your urine and blood.   Keep yourself hydrated, and call the clinic if you feel worse. If you feel faint, or lightheaded, please call 911.   For your muscle contractures, please take baclofen increased dose 20mg  in the morning, when you feel the worst. Take 10 mg at noon and bedtime as you used to.   I will discuss with our social worker if there are other ways we can help you get home help.  Thanks, Madilyn Fireman MD MPH 05/23/2014 11:50 AM

## 2014-05-24 LAB — COMPLETE METABOLIC PANEL WITH GFR
ALK PHOS: 70 U/L (ref 39–117)
AST: 12 U/L (ref 0–37)
Albumin: 4.2 g/dL (ref 3.5–5.2)
BUN: 10 mg/dL (ref 6–23)
CALCIUM: 9.3 mg/dL (ref 8.4–10.5)
CO2: 27 mEq/L (ref 19–32)
CREATININE: 0.66 mg/dL (ref 0.50–1.35)
Chloride: 101 mEq/L (ref 96–112)
GFR, Est African American: >89 mL/min
GFR, Est Non African American: >89 mL/min
Glucose, Bld: 97 mg/dL (ref 70–99)
Potassium: 4 mEq/L (ref 3.5–5.3)
Sodium: 138 mEq/L (ref 135–145)
Total Bilirubin: 0.9 mg/dL (ref 0.2–1.2)
Total Protein: 7.2 g/dL (ref 6.0–8.3)

## 2014-05-24 LAB — URINALYSIS, ROUTINE W REFLEX MICROSCOPIC
BILIRUBIN URINE: NEGATIVE
Glucose, UA: NEGATIVE mg/dL
Hgb urine dipstick: NEGATIVE
Ketones, ur: NEGATIVE mg/dL
Nitrite: NEGATIVE
PH: 6.5 (ref 5.0–8.0)
PROTEIN: NEGATIVE mg/dL
SPECIFIC GRAVITY, URINE: 1.021 (ref 1.005–1.030)
Urobilinogen, UA: 1 mg/dL (ref 0.0–1.0)

## 2014-05-24 LAB — URINE CULTURE
Colony Count: NO GROWTH
Organism ID, Bacteria: NO GROWTH

## 2014-05-24 LAB — URINALYSIS, MICROSCOPIC ONLY
Bacteria, UA: NONE SEEN
Casts: NONE SEEN
Crystals: NONE SEEN
SQUAMOUS EPITHELIAL / LPF: NONE SEEN

## 2014-05-25 NOTE — Assessment & Plan Note (Addendum)
Assessment: Progressive muscle spasms and contractures resulting in increased pain  Plan:  Advised patient to take 20 mg of baclofen in the morning when he feels worst. He can keep taking 10 mg in the noon and at nightime.  Will not go up on narcotics.   Discussed case with Golden Hurter verbally. She will briefly visit the patient in his room after me and try to see if there can be help provided.

## 2014-05-25 NOTE — Assessment & Plan Note (Signed)
Examined. Healed.

## 2014-05-25 NOTE — Assessment & Plan Note (Addendum)
Assessment: Systemic symptoms like weakness and lightheadness could be due to ongoing infection, UTI based on symptoms and report from 05/03/14.   Plan:  Will start Augmentin based on sensitivities in [facility] report from 05/03/14.   Will do dipstick, UA and cultures, CMP, CBC.   If no improvement, asked patient to call clinic and schedule another visit.

## 2014-05-25 NOTE — Progress Notes (Signed)
Subjective:     Patient ID: Joshua Park, male   DOB: [birth year], 43 y.o.   MRN: [id number]  HPI Joshua Park is a 43 year old paraplegic patient with history of recurrent UTI secondary to neurogenic bladder and need for self catheterization. He comes in for a routine follow up. He had some urine lab work done on 05/03/14 which was sent to clinic, which could have been indicative of infection given any symptoms. However at that time, the patient could not be contacted because he had changed phone numbers and not intimated the clinic. Thus he was not started on any antibiotics. Today, he tells me that for the past 2-3 weeks he has been feeling nauseous, having chills, feels weak and lightheaded at times, especially after going to the bath room. He is trying to drink enough water to stay hydrated. He denies chest pain, or palpitations, however he does endorse feeling extremely anxious at night. He denies diaphoresis, fever or abdominal/flank pain. He says his urine has been cloudy and fowl smelling for the past 2-3 weeks. He does not get the typical UTI symptoms like dysuria or frequency.     He also complains about increasing pain in his legs due to disuse contractures and atrophy. His insurance does not cover PT and he has no-one who can help him with some home therapy. He insists on increasing his pain medication.    Review of Systems As above    Objective:   Physical Exam  Constitutional: He is oriented to person, place, and time. He appears well-developed and well-nourished. No distress.  Cardiovascular: Normal rate, regular rhythm and normal heart sounds.   No murmur heard. Pulmonary/Chest: Effort normal and breath sounds normal.  Abdominal: Soft. Bowel sounds are normal. He exhibits no distension and no mass. There is no tenderness. There is no rebound and no guarding.  No flank tenderness  Musculoskeletal: He exhibits no edema.  Neurological: He is alert and oriented to person, place, and  time.  Wheelchair bound, paraplegic waist down  Skin: He is not diaphoretic.       Assessment & Plan:      Please see problem based charting.

## 2014-06-02 ENCOUNTER — Observation Stay (HOSPITAL_COMMUNITY): Payer: Medicaid Other

## 2014-06-02 ENCOUNTER — Emergency Department (HOSPITAL_COMMUNITY): Payer: Medicaid Other

## 2014-06-02 ENCOUNTER — Observation Stay (HOSPITAL_COMMUNITY)
Admission: EM | Admit: 2014-06-02 | Discharge: 2014-06-03 | Disposition: A | Discharge status: Home / Self Care | Payer: Medicaid Other | Attending: Oncology | Admitting: Oncology

## 2014-06-02 ENCOUNTER — Encounter (HOSPITAL_COMMUNITY): Payer: Self-pay | Admitting: Emergency Medicine

## 2014-06-02 DIAGNOSIS — Z86718 Personal history of other venous thrombosis and embolism: Secondary | ICD-10-CM | POA: Insufficient documentation

## 2014-06-02 DIAGNOSIS — Z720 Tobacco use: Secondary | ICD-10-CM | POA: Insufficient documentation

## 2014-06-02 DIAGNOSIS — Z87440 Personal history of urinary (tract) infections: Secondary | ICD-10-CM | POA: Insufficient documentation

## 2014-06-02 DIAGNOSIS — Z936 Other artificial openings of urinary tract status: Secondary | ICD-10-CM

## 2014-06-02 DIAGNOSIS — K219 Gastro-esophageal reflux disease without esophagitis: Secondary | ICD-10-CM | POA: Diagnosis present

## 2014-06-02 DIAGNOSIS — R0602 Shortness of breath: Secondary | ICD-10-CM | POA: Insufficient documentation

## 2014-06-02 DIAGNOSIS — G8929 Other chronic pain: Secondary | ICD-10-CM

## 2014-06-02 DIAGNOSIS — Z79899 Other long term (current) drug therapy: Secondary | ICD-10-CM | POA: Diagnosis not present

## 2014-06-02 DIAGNOSIS — R42 Dizziness and giddiness: Secondary | ICD-10-CM

## 2014-06-02 DIAGNOSIS — N319 Neuromuscular dysfunction of bladder, unspecified: Secondary | ICD-10-CM | POA: Diagnosis present

## 2014-06-02 DIAGNOSIS — R002 Palpitations: Secondary | ICD-10-CM | POA: Diagnosis not present

## 2014-06-02 DIAGNOSIS — M549 Dorsalgia, unspecified: Secondary | ICD-10-CM | POA: Diagnosis present

## 2014-06-02 DIAGNOSIS — R079 Chest pain, unspecified: Principal | ICD-10-CM

## 2014-06-02 DIAGNOSIS — R0789 Other chest pain: Secondary | ICD-10-CM | POA: Diagnosis present

## 2014-06-02 DIAGNOSIS — G8220 Paraplegia, unspecified: Secondary | ICD-10-CM

## 2014-06-02 DIAGNOSIS — L8992 Pressure ulcer of unspecified site, stage 2: Secondary | ICD-10-CM

## 2014-06-02 DIAGNOSIS — J984 Other disorders of lung: Secondary | ICD-10-CM

## 2014-06-02 HISTORY — DX: Acute embolism and thrombosis of unspecified deep veins of unspecified lower extremity: I82.409

## 2014-06-02 HISTORY — DX: Headache: R51

## 2014-06-02 HISTORY — DX: Gastro-esophageal reflux disease without esophagitis: K21.9

## 2014-06-02 HISTORY — DX: Other specified health status: Z78.9

## 2014-06-02 HISTORY — DX: Depression, unspecified: F32.A

## 2014-06-02 HISTORY — DX: Anemia, unspecified: D64.9

## 2014-06-02 HISTORY — DX: Anxiety disorder, unspecified: F41.9

## 2014-06-02 HISTORY — DX: Personal history of other medical treatment: Z92.89

## 2014-06-02 HISTORY — DX: Headache, unspecified: R51.9

## 2014-06-02 HISTORY — DX: Major depressive disorder, single episode, unspecified: F32.9

## 2014-06-02 LAB — RAPID URINE DRUG SCREEN, HOSP PERFORMED
Amphetamines: NOT DETECTED
BARBITURATES: NOT DETECTED
Benzodiazepines: NOT DETECTED
Cocaine: NOT DETECTED
Opiates: NOT DETECTED
TETRAHYDROCANNABINOL: NOT DETECTED

## 2014-06-02 LAB — CBC
HEMATOCRIT: 42.1 % (ref 39.0–52.0)
Hemoglobin: 14.4 g/dL (ref 13.0–17.0)
MCH: 30.6 pg (ref 26.0–34.0)
MCHC: 34.2 g/dL (ref 30.0–36.0)
MCV: 89.4 fL (ref 78.0–100.0)
Platelets: 171 10*3/uL (ref 150–400)
RBC: 4.71 MIL/uL (ref 4.22–5.81)
RDW: 12.6 % (ref 11.5–15.5)
WBC: 6.2 10*3/uL (ref 4.0–10.5)

## 2014-06-02 LAB — BASIC METABOLIC PANEL
ANION GAP: 13 (ref 5–15)
BUN: 13 mg/dL (ref 6–23)
CHLORIDE: 102 meq/L (ref 96–112)
CO2: 24 meq/L (ref 19–32)
Calcium: 9.3 mg/dL (ref 8.4–10.5)
Creatinine, Ser: 0.7 mg/dL (ref 0.50–1.35)
GFR calc Af Amer: >90 mL/min (ref >90)
GFR calc non Af Amer: >90 mL/min (ref >90)
Glucose, Bld: 98 mg/dL (ref 70–99)
Potassium: 3.9 mEq/L (ref 3.7–5.3)
Sodium: 139 mEq/L (ref 137–147)

## 2014-06-02 LAB — TROPONIN I
Troponin I: <0.3 ng/mL (ref <0.30)
Troponin I: <0.3 ng/mL (ref <0.30)

## 2014-06-02 MED ORDER — LORAZEPAM 1 MG PO TABS
1.0000 mg | ORAL_TABLET | Freq: Four times a day (QID) | ORAL | Status: DC | PRN
Start: 2014-06-02 — End: 2014-06-03
  Administered 2014-06-02: 1 mg via ORAL
  Filled 2014-06-02: qty 1

## 2014-06-02 MED ORDER — BACLOFEN 10 MG PO TABS: 10.0000 mg | ORAL_TABLET | Freq: Three times a day (TID) | ORAL | Status: DC

## 2014-06-02 MED ORDER — TAMSULOSIN HCL 0.4 MG PO CAPS
0.4000 mg | ORAL_CAPSULE | Freq: Every day | ORAL | Status: DC
Start: 2014-06-02 — End: 2014-06-03
  Administered 2014-06-02: 0.4 mg via ORAL
  Filled 2014-06-02: qty 1

## 2014-06-02 MED ORDER — TEMAZEPAM 15 MG PO CAPS
15.0000 mg | ORAL_CAPSULE | Freq: Every day | ORAL | Status: DC
  Administered 2014-06-02: 15 mg via ORAL
  Filled 2014-06-02: qty 1

## 2014-06-02 MED ORDER — ENOXAPARIN SODIUM 30 MG/0.3ML ~~LOC~~ SOLN
30.0000 mg | SUBCUTANEOUS | Status: DC
  Administered 2014-06-02 – 2014-06-03 (×2): 30 mg via SUBCUTANEOUS
  Filled 2014-06-02 (×2): qty 0.3

## 2014-06-02 MED ORDER — BACLOFEN 20 MG PO TABS
20.0000 mg | ORAL_TABLET | Freq: Every day | ORAL | Status: DC
  Administered 2014-06-03: 20 mg via ORAL
  Filled 2014-06-02 (×2): qty 1

## 2014-06-02 MED ORDER — NITROGLYCERIN 0.4 MG SL SUBL
0.4000 mg | SUBLINGUAL_TABLET | SUBLINGUAL | Status: DC | PRN
  Administered 2014-06-02: 0.4 mg via SUBLINGUAL
  Filled 2014-06-02: qty 1

## 2014-06-02 MED ORDER — NITROGLYCERIN 0.4 MG SL SUBL: 0.4000 mg | SUBLINGUAL_TABLET | SUBLINGUAL | Status: DC | PRN

## 2014-06-02 MED ORDER — SODIUM CHLORIDE 0.9 % IJ SOLN
3.0000 mL | Freq: Two times a day (BID) | INTRAMUSCULAR | Status: DC
  Administered 2014-06-02 (×2): 3 mL via INTRAVENOUS

## 2014-06-02 MED ORDER — BACLOFEN 10 MG PO TABS
10.0000 mg | ORAL_TABLET | ORAL | Status: DC
  Administered 2014-06-02 (×2): 10 mg via ORAL
  Filled 2014-06-02 (×2): qty 1

## 2014-06-02 MED ORDER — SILVER SULFADIAZINE 1 % EX CREA
TOPICAL_CREAM | Freq: Every day | CUTANEOUS | Status: DC
  Filled 2014-06-02: qty 85

## 2014-06-02 MED ORDER — ASPIRIN 81 MG PO CHEW
324.0000 mg | CHEWABLE_TABLET | Freq: Once | ORAL | Status: AC
  Administered 2014-06-02: 324 mg via ORAL
  Filled 2014-06-02: qty 4

## 2014-06-02 MED ORDER — IOHEXOL 350 MG/ML SOLN
100.0000 mL | Freq: Once | INTRAVENOUS | Status: AC | PRN
  Administered 2014-06-02: 100 mL via INTRAVENOUS

## 2014-06-02 MED ORDER — ACETAMINOPHEN 325 MG PO TABS: 650.0000 mg | ORAL_TABLET | Freq: Four times a day (QID) | ORAL | Status: DC | PRN

## 2014-06-02 MED ORDER — OXYCODONE HCL 5 MG PO TABS
7.5000 mg | ORAL_TABLET | ORAL | Status: DC | PRN
  Administered 2014-06-02 – 2014-06-03 (×5): 7.5 mg via ORAL
  Filled 2014-06-02 (×5): qty 2

## 2014-06-02 MED ORDER — GABAPENTIN 300 MG PO CAPS
600.0000 mg | ORAL_CAPSULE | Freq: Three times a day (TID) | ORAL | Status: DC
  Administered 2014-06-02: 300 mg via ORAL
  Administered 2014-06-02 – 2014-06-03 (×2): 600 mg via ORAL
  Filled 2014-06-02 (×4): qty 2

## 2014-06-02 MED ORDER — PANTOPRAZOLE SODIUM 40 MG PO TBEC
40.0000 mg | DELAYED_RELEASE_TABLET | Freq: Every day | ORAL | Status: DC
  Administered 2014-06-02 – 2014-06-03 (×2): 40 mg via ORAL
  Filled 2014-06-02 (×2): qty 1

## 2014-06-02 MED ORDER — ENSURE COMPLETE PO LIQD
237.0000 mL | Freq: Two times a day (BID) | ORAL | Status: DC
  Administered 2014-06-02 – 2014-06-03 (×2): 237 mL via ORAL
  Filled 2014-06-02 (×4): qty 237
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
## 2014-06-02 NOTE — Progress Notes (Signed)
Patient seen and briefly examined. C/o of sharp, tingling chest pain which is only present when lying down, not worse with exertion. Initial EKG shows new anterior T wave inversions, compared to prior in 03/2014. Initial troponin is negative. Mild relief with ASA, nitro +/- time. Lung density noted on chest radiograph?  I doubt this is cardiac chest pain - would get additional troponins. Repeat 12 lead EKG for change in T waves. May need monitor at home after discharge for palpitations.  I don't feel stress testing is indicated. It sounds like he may have PTSD - exaggerated startle, anxiety, heart racing with vivid, intrusive dreams at night. May benefit from further evaluation of this and possibly an SSRI.  Pixie Casino, MD, [facility] Attending Cardiologist [place]
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
## 2014-06-02 NOTE — Progress Notes (Signed)
UR completed 

## 2014-06-02 NOTE — ED Notes (Signed)
Pt presents with chest tightness that has been present for the past 4 nights, pt reports pain is only present when he is lying in bed.  Pt states he is usually able to go back to sleep but was concerned due to persisting pain tonight.  Admits to some shortness of breath, denies nausea or vomiting.
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
## 2014-06-02 NOTE — Consult Note (Signed)
WOC wound consult note Reason for Consult: Consult requested for sacral wound.  Pt states that this area has been healed for "about 8 years." Wound type: Pink dry scar tissue to buttocks/sacrum area.  No open wound, drainage, or need for topical treatment at this time. Please re-consult if further assistance is needed.  Thank-you,  Julien Girt MSN, [place], [place], [place], [place]

## 2014-06-03 DIAGNOSIS — Z720 Tobacco use: Secondary | ICD-10-CM | POA: Diagnosis not present

## 2014-06-03 DIAGNOSIS — R079 Chest pain, unspecified: Secondary | ICD-10-CM | POA: Diagnosis not present

## 2014-06-03 DIAGNOSIS — R002 Palpitations: Secondary | ICD-10-CM | POA: Diagnosis not present

## 2014-06-03 DIAGNOSIS — R0602 Shortness of breath: Secondary | ICD-10-CM | POA: Diagnosis not present

## 2014-06-03 MED ORDER — SERTRALINE HCL 50 MG PO TABS: 50.0000 mg | ORAL_TABLET | Freq: Every day | ORAL | Status: DC

## 2014-06-03 MED ORDER — SERTRALINE HCL 50 MG PO TABS
50.0000 mg | ORAL_TABLET | Freq: Every day | ORAL | Status: DC
  Administered 2014-06-03: 50 mg via ORAL
  Filled 2014-06-03: qty 1

## 2014-06-03 MED ORDER — TEMAZEPAM 15 MG PO CAPS: 15.0000 mg | ORAL_CAPSULE | Freq: Every day | ORAL | Status: DC
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
## 2014-06-03 NOTE — Progress Notes (Signed)
UR completed 
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
## 2014-06-03 NOTE — Discharge Instructions (Signed)
1. Please schedule a follow up appointment for 1-2 weeks w/ PCP.   They will call you with an appointment.  2. Please take all medications as prescribed.   Start taking Zoloft 50 mg daily for anxiety.  Take Restoril 15 mg once at night if needed for sleep.  3. If you have worsening of your symptoms or new symptoms arise, please call the clinic ([phone]), or go to the ER immediately if symptoms are severe.    Chest Pain (Nonspecific) It is often hard to give a specific diagnosis for the cause of chest pain. There is always a chance that your pain could be related to something serious, such as a heart attack or a blood clot in the lungs. You need to follow up with your health care provider for further evaluation. CAUSES   Heartburn.  Pneumonia or bronchitis.  Anxiety or stress.  Inflammation around your heart (pericarditis) or lung (pleuritis or pleurisy).  A blood clot in the lung.  A collapsed lung (pneumothorax). It can develop suddenly on its own (spontaneous pneumothorax) or from trauma to the chest.  Shingles infection (herpes zoster virus). The chest wall is composed of bones, muscles, and cartilage. Any of these can be the source of the pain.  The bones can be bruised by injury.  The muscles or cartilage can be strained by coughing or overwork.  The cartilage can be affected by inflammation and become sore (costochondritis). DIAGNOSIS  Lab tests or other studies may be needed to find the cause of your pain. Your health care provider may have you take a test called an ambulatory electrocardiogram (ECG). An ECG records your heartbeat patterns over a 24-hour period. You may also have other tests, such as:  Transthoracic echocardiogram (TTE). During echocardiography, sound waves are used to evaluate how blood flows through your heart.  Transesophageal echocardiogram (TEE).  Cardiac monitoring. This allows your health care provider to monitor your heart rate and rhythm  in real time.  Holter monitor. This is a portable device that records your heartbeat and can help diagnose heart arrhythmias. It allows your health care provider to track your heart activity for several days, if needed.  Stress tests by exercise or by giving medicine that makes the heart beat faster. TREATMENT   Treatment depends on what may be causing your chest pain. Treatment may include:  Acid blockers for heartburn.  Anti-inflammatory medicine.  Pain medicine for inflammatory conditions.  Antibiotics if an infection is present.  You may be advised to change lifestyle habits. This includes stopping smoking and avoiding alcohol, caffeine, and chocolate.  You may be advised to keep your head raised (elevated) when sleeping. This reduces the chance of acid going backward from your stomach into your esophagus. Most of the time, nonspecific chest pain will improve within 2-3 days with rest and mild pain medicine.  HOME CARE INSTRUCTIONS   If antibiotics were prescribed, take them as directed. Finish them even if you start to feel better.  For the next few days, avoid physical activities that bring on chest pain. Continue physical activities as directed.  Do not use any tobacco products, including cigarettes, chewing tobacco, or electronic cigarettes.  Avoid drinking alcohol.  Only take medicine as directed by your health care provider.  Follow your health care provider's suggestions for further testing if your chest pain does not go away.  Keep any follow-up appointments you made. If you do not go to an appointment, you could develop lasting (chronic) problems with  pain. If there is any problem keeping an appointment, call to reschedule. SEEK MEDICAL CARE IF:   Your chest pain does not go away, even after treatment.  You have a rash with blisters on your chest.  You have a fever. SEEK IMMEDIATE MEDICAL CARE IF:   You have increased chest pain or pain that spreads to your  arm, neck, jaw, back, or abdomen.  You have shortness of breath.  You have an increasing cough, or you cough up blood.  You have severe back or abdominal pain.  You feel nauseous or vomit.  You have severe weakness.  You faint.  You have chills. This is an emergency. Do not wait to see if the pain will go away. Get medical help at once. Call your local emergency services (911 in [place] Do not drive yourself to the hospital. MAKE SURE YOU:   Understand these instructions.  Will watch your condition.  Will get help right away if you are not doing well or get worse. Document Released: 04/02/2005 Document Revised: 06/28/2013 Document Reviewed: 01/27/2008 [facility] Patient Information 2015 [place], [place]. This information is not intended to replace advice given to you by your health care provider. Make sure you discuss any questions you have with your health care provider.    Post-traumatic Stress This condition causes many different symptoms including: emotional outbursts, anxiety, sleeping problems, social withdrawal, and drug abuse. PTSD often follows a particularly traumatic event such as war, or natural disasters like hurricanes, earthquakes, or floods. It can also be seen after personal traumas such as accidents, rape, or the death of someone you love. Symptoms may be delayed for days or even years. Emotional numbing and the inability to feel your emotions, may be the earliest sign. Periods of agitation, aggression, and inability to perform ordinary tasks are common with PTSD. Nightmares and daytime memories of the trauma often bring on uncontrolled symptoms. Sufferers typically startle easily and avoid reminders of the trauma. Panic attacks, feelings of extreme guilt, and blackouts are often reported. Treatment is very helpful, especially group therapy. Healing happens when emotional traumas are shared with others who have a sympathetic ear. The [facility]  have helped over 185,000 veterans with this problem. Medication is also very effective. The symptoms can become chronic and lifelong, so it is important to get help. Call your caregiver or a counselor who deals with this type of problem for further assistance. Document Released: 07/31/2004 Document Revised: 09/15/2011 Document Reviewed: 06/23/2005 [facility] Patient Information 2015 [place], [place]. This information is not intended to replace advice given to you by your health care provider. Make sure you discuss any questions you have with your health care provider.

## 2014-06-07 ENCOUNTER — Emergency Department (HOSPITAL_COMMUNITY)
Admission: EM | Admit: 2014-06-07 | Discharge: 2014-06-08 | Disposition: A | Discharge status: Home / Self Care | Payer: Medicaid Other | Attending: Emergency Medicine | Admitting: Emergency Medicine

## 2014-06-07 ENCOUNTER — Encounter (HOSPITAL_COMMUNITY): Payer: Self-pay | Admitting: Emergency Medicine

## 2014-06-07 DIAGNOSIS — Z8619 Personal history of other infectious and parasitic diseases: Secondary | ICD-10-CM | POA: Diagnosis not present

## 2014-06-07 DIAGNOSIS — Z87828 Personal history of other (healed) physical injury and trauma: Secondary | ICD-10-CM | POA: Diagnosis not present

## 2014-06-07 DIAGNOSIS — R1013 Epigastric pain: Secondary | ICD-10-CM | POA: Diagnosis not present

## 2014-06-07 DIAGNOSIS — Z87891 Personal history of nicotine dependence: Secondary | ICD-10-CM | POA: Insufficient documentation

## 2014-06-07 DIAGNOSIS — Z86718 Personal history of other venous thrombosis and embolism: Secondary | ICD-10-CM | POA: Diagnosis not present

## 2014-06-07 DIAGNOSIS — Z79899 Other long term (current) drug therapy: Secondary | ICD-10-CM | POA: Insufficient documentation

## 2014-06-07 DIAGNOSIS — F329 Major depressive disorder, single episode, unspecified: Secondary | ICD-10-CM | POA: Insufficient documentation

## 2014-06-07 DIAGNOSIS — F419 Anxiety disorder, unspecified: Secondary | ICD-10-CM | POA: Diagnosis not present

## 2014-06-07 DIAGNOSIS — Z87440 Personal history of urinary (tract) infections: Secondary | ICD-10-CM | POA: Insufficient documentation

## 2014-06-07 DIAGNOSIS — R0789 Other chest pain: Secondary | ICD-10-CM | POA: Diagnosis not present

## 2014-06-07 DIAGNOSIS — Z872 Personal history of diseases of the skin and subcutaneous tissue: Secondary | ICD-10-CM | POA: Diagnosis not present

## 2014-06-07 DIAGNOSIS — K219 Gastro-esophageal reflux disease without esophagitis: Secondary | ICD-10-CM | POA: Diagnosis not present

## 2014-06-07 DIAGNOSIS — Z8669 Personal history of other diseases of the nervous system and sense organs: Secondary | ICD-10-CM | POA: Diagnosis not present

## 2014-06-07 DIAGNOSIS — R079 Chest pain, unspecified: Secondary | ICD-10-CM

## 2014-06-07 LAB — CBC
HCT: 42.6 % (ref 39.0–52.0)
Hemoglobin: 14.5 g/dL (ref 13.0–17.0)
MCH: 30.1 pg (ref 26.0–34.0)
MCHC: 34 g/dL (ref 30.0–36.0)
MCV: 88.6 fL (ref 78.0–100.0)
PLATELETS: 214 10*3/uL (ref 150–400)
RBC: 4.81 MIL/uL (ref 4.22–5.81)
RDW: 12.5 % (ref 11.5–15.5)
WBC: 4.6 10*3/uL (ref 4.0–10.5)

## 2014-06-07 LAB — BASIC METABOLIC PANEL
ANION GAP: 17 — AB (ref 5–15)
BUN: 11 mg/dL (ref 6–23)
CO2: 22 meq/L (ref 19–32)
CREATININE: 0.64 mg/dL (ref 0.50–1.35)
Calcium: 9.7 mg/dL (ref 8.4–10.5)
Chloride: 95 mEq/L — ABNORMAL LOW (ref 96–112)
GFR calc non Af Amer: >90 mL/min (ref >90)
Glucose, Bld: 101 mg/dL — ABNORMAL HIGH (ref 70–99)
Potassium: 3.6 mEq/L — ABNORMAL LOW (ref 3.7–5.3)
SODIUM: 134 meq/L — AB (ref 137–147)

## 2014-06-07 MED ORDER — GI COCKTAIL ~~LOC~~
30.0000 mL | Freq: Once | ORAL | Status: AC
  Administered 2014-06-08: 30 mL via ORAL
  Filled 2014-06-07: qty 30
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
## 2014-06-08 LAB — I-STAT TROPONIN, ED: TROPONIN I, POC: 0 ng/mL (ref 0.00–0.08)

## 2014-06-08 MED ORDER — PANTOPRAZOLE SODIUM 40 MG PO TBEC: 40.0000 mg | DELAYED_RELEASE_TABLET | Freq: Two times a day (BID) | ORAL | Status: DC

## 2014-06-08 NOTE — ED Notes (Signed)
I stat troponin not crossing over in Epic, results of I stat troponin 0.00 ng/mL

## 2014-06-08 NOTE — ED Notes (Signed)
Pt. waiting for his sister from home to transport him back home .

## 2014-06-08 NOTE — Discharge Instructions (Signed)
Chest Pain (Nonspecific) °It is often hard to give a specific diagnosis for the cause of chest pain. There is always a chance that your pain could be related to something serious, such as a heart attack or a blood clot in the lungs. You need to follow up with your health care provider for further evaluation. °CAUSES  °· Heartburn. °· Pneumonia or bronchitis. °· Anxiety or stress. °· Inflammation around your heart (pericarditis) or lung (pleuritis or pleurisy). °· A blood clot in the lung. °· A collapsed lung (pneumothorax). It can develop suddenly on its own (spontaneous pneumothorax) or from trauma to the chest. °· Shingles infection (herpes zoster virus). °The chest wall is composed of bones, muscles, and cartilage. Any of these can be the source of the pain. °· The bones can be bruised by injury. °· The muscles or cartilage can be strained by coughing or overwork. °· The cartilage can be affected by inflammation and become sore (costochondritis). °DIAGNOSIS  °Lab tests or other studies may be needed to find the cause of your pain. Your health care provider may have you take a test called an ambulatory electrocardiogram (ECG). An ECG records your heartbeat patterns over a 24-hour period. You may also have other tests, such as: °· Transthoracic echocardiogram (TTE). During echocardiography, sound waves are used to evaluate how blood flows through your heart. °· Transesophageal echocardiogram (TEE). °· Cardiac monitoring. This allows your health care provider to monitor your heart rate and rhythm in real time. °· Holter monitor. This is a portable device that records your heartbeat and can help diagnose heart arrhythmias. It allows your health care provider to track your heart activity for several days, if needed. °· Stress tests by exercise or by giving medicine that makes the heart beat faster. °TREATMENT  °· Treatment depends on what may be causing your chest pain. Treatment may include: °· Acid blockers for  heartburn. °· Anti-inflammatory medicine. °· Pain medicine for inflammatory conditions. °· Antibiotics if an infection is present. °· You may be advised to change lifestyle habits. This includes stopping smoking and avoiding alcohol, caffeine, and chocolate. °· You may be advised to keep your head raised (elevated) when sleeping. This reduces the chance of acid going backward from your stomach into your esophagus. °Most of the time, nonspecific chest pain will improve within 2-3 days with rest and mild pain medicine.  °HOME CARE INSTRUCTIONS  °· If antibiotics were prescribed, take them as directed. Finish them even if you start to feel better. °· For the next few days, avoid physical activities that bring on chest pain. Continue physical activities as directed. °· Do not use any tobacco products, including cigarettes, chewing tobacco, or electronic cigarettes. °· Avoid drinking alcohol. °· Only take medicine as directed by your health care provider. °· Follow your health care provider's suggestions for further testing if your chest pain does not go away. °· Keep any follow-up appointments you made. If you do not go to an appointment, you could develop lasting (chronic) problems with pain. If there is any problem keeping an appointment, call to reschedule. °SEEK MEDICAL CARE IF:  °· Your chest pain does not go away, even after treatment. °· You have a rash with blisters on your chest. °· You have a fever. °SEEK IMMEDIATE MEDICAL CARE IF:  °· You have increased chest pain or pain that spreads to your arm, neck, jaw, back, or abdomen. °· You have shortness of breath. °· You have an increasing cough, or you cough   up blood. °· You have severe back or abdominal pain. °· You feel nauseous or vomit. °· You have severe weakness. °· You faint. °· You have chills. °This is an emergency. Do not wait to see if the pain will go away. Get medical help at once. Call your local emergency services (911 in [place] Do not drive  yourself to the hospital. °MAKE SURE YOU:  °· Understand these instructions. °· Will watch your condition. °· Will get help right away if you are not doing well or get worse. °Document Released: 04/02/2005 Document Revised: 06/28/2013 Document Reviewed: 01/27/2008 °ExitCare® Patient Information ©2015 [employer]. This information is not intended to replace advice given to you by your health care provider. Make sure you discuss any questions you have with your health care provider. °Gastroesophageal Reflux Disease, Adult °Gastroesophageal reflux disease (GERD) happens when acid from your stomach flows up into the esophagus. When acid comes in contact with the esophagus, the acid causes soreness (inflammation) in the esophagus. Over time, GERD may create small holes (ulcers) in the lining of the esophagus. °CAUSES  °· Increased body weight. This puts pressure on the stomach, making acid rise from the stomach into the esophagus. °· Smoking. This increases acid production in the stomach. °· Drinking alcohol. This causes decreased pressure in the lower esophageal sphincter (valve or ring of muscle between the esophagus and stomach), allowing acid from the stomach into the esophagus. °· Late evening meals and a full stomach. This increases pressure and acid production in the stomach. °· A malformed lower esophageal sphincter. °Sometimes, no cause is found. °SYMPTOMS  °· Burning pain in the lower part of the mid-chest behind the breastbone and in the mid-stomach area. This may occur twice a week or more often. °· Trouble swallowing. °· Sore throat. °· Dry cough. °· Asthma-like symptoms including chest tightness, shortness of breath, or wheezing. °DIAGNOSIS  °Your caregiver may be able to diagnose GERD based on your symptoms. In some cases, X-rays and other tests may be done to check for complications or to check the condition of your stomach and esophagus. °TREATMENT  °Your caregiver may recommend over-the-counter or  prescription medicines to help decrease acid production. Ask your caregiver before starting or adding any new medicines.  °HOME CARE INSTRUCTIONS  °· Change the factors that you can control. Ask your caregiver for guidance concerning weight loss, quitting smoking, and alcohol consumption. °· Avoid foods and drinks that make your symptoms worse, such as: °¨ Caffeine or alcoholic drinks. °¨ Chocolate. °¨ Peppermint or mint flavorings. °¨ Garlic and onions. °¨ Spicy foods. °¨ Citrus fruits, such as oranges, lemons, or limes. °¨ Tomato-based foods such as sauce, chili, salsa, and pizza. °¨ Fried and fatty foods. °· Avoid lying down for the 3 hours prior to your bedtime or prior to taking a nap. °· Eat small, frequent meals instead of large meals. °· Wear loose-fitting clothing. Do not wear anything tight around your waist that causes pressure on your stomach. °· Raise the head of your bed 6 to 8 inches with wood blocks to help you sleep. Extra pillows will not help. °· Only take over-the-counter or prescription medicines for pain, discomfort, or fever as directed by your caregiver. °· Do not take aspirin, ibuprofen, or other nonsteroidal anti-inflammatory drugs (NSAIDs). °SEEK IMMEDIATE MEDICAL CARE IF:  °· You have pain in your arms, neck, jaw, teeth, or back. °· Your pain increases or changes in intensity or duration. °· You develop nausea, vomiting, or sweating (diaphoresis). °·   You develop shortness of breath, or you faint. °· Your vomit is green, yellow, black, or looks like coffee grounds or blood. °· Your stool is red, bloody, or black. °These symptoms could be signs of other problems, such as heart disease, gastric bleeding, or esophageal bleeding. °MAKE SURE YOU:  °· Understand these instructions. °· Will watch your condition. °· Will get help right away if you are not doing well or get worse. °Document Released: 04/02/2005 Document Revised: 09/15/2011 Document Reviewed: 01/10/2011 °ExitCare® Patient  Information ©2015 [employer]. This information is not intended to replace advice given to you by your health care provider. Make sure you discuss any questions you have with your health care provider. ° °

## 2014-06-09 ENCOUNTER — Ambulatory Visit: Payer: Medicaid Other | Admitting: Internal Medicine

## 2014-06-13 ENCOUNTER — Encounter: Payer: Self-pay | Admitting: Internal Medicine

## 2014-06-13 ENCOUNTER — Ambulatory Visit (INDEPENDENT_AMBULATORY_CARE_PROVIDER_SITE_OTHER): Payer: Medicaid Other | Admitting: Internal Medicine

## 2014-06-13 VITALS — BP 118/72 | HR 63 | Temp 98.0°F | Ht 70.0 in

## 2014-06-13 DIAGNOSIS — R0789 Other chest pain: Secondary | ICD-10-CM

## 2014-06-13 DIAGNOSIS — F32A Depression, unspecified: Secondary | ICD-10-CM

## 2014-06-13 DIAGNOSIS — F419 Anxiety disorder, unspecified: Secondary | ICD-10-CM

## 2014-06-13 DIAGNOSIS — N390 Urinary tract infection, site not specified: Secondary | ICD-10-CM

## 2014-06-13 DIAGNOSIS — F329 Major depressive disorder, single episode, unspecified: Secondary | ICD-10-CM

## 2014-06-13 DIAGNOSIS — F418 Other specified anxiety disorders: Secondary | ICD-10-CM

## 2014-06-13 MED ORDER — SULFAMETHOXAZOLE-TRIMETHOPRIM 800-160 MG PO TABS: 1.0000 | ORAL_TABLET | Freq: Two times a day (BID) | ORAL | Status: DC

## 2014-06-13 MED ORDER — SERTRALINE HCL 50 MG PO TABS: 50.0000 mg | ORAL_TABLET | Freq: Every day | ORAL | Status: DC
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
## 2014-06-13 NOTE — Patient Instructions (Signed)
General Instructions:  1. Please schedule a follow up appointment for 2-4 weeks.    2. Please take all medications as prescribed.   Start taking Bactrim-DS 1 tablet twice daily for a total of 7 days.    Take Zoloft 50 mg daily for anxiety and depression.   Take Restoril 15 mg at night as needed for sleep. You can take 1/2 tablet if you would rather take a smaller dose.   3. If you have worsening of your symptoms or new symptoms arise, please call the clinic ([phone]), or go to the ER immediately if symptoms are severe.   Please bring your medicines with you each time you come to clinic.  Medicines may include prescription medications, over-the-counter medications, herbal remedies, eye drops, vitamins, or other pills.

## 2014-06-14 ENCOUNTER — Encounter: Payer: Self-pay | Admitting: Internal Medicine

## 2014-06-14 LAB — URINALYSIS, ROUTINE W REFLEX MICROSCOPIC
Bilirubin Urine: NEGATIVE
GLUCOSE, UA: NEGATIVE mg/dL
Hgb urine dipstick: NEGATIVE
KETONES UR: NEGATIVE mg/dL
Nitrite: NEGATIVE
Protein, ur: NEGATIVE mg/dL
SPECIFIC GRAVITY, URINE: 1.027 (ref 1.005–1.030)
UROBILINOGEN UA: 1 mg/dL (ref 0.0–1.0)
pH: 6 (ref 5.0–8.0)

## 2014-06-14 LAB — URINE CULTURE
Colony Count: NO GROWTH
ORGANISM ID, BACTERIA: NO GROWTH

## 2014-06-14 LAB — URINALYSIS, MICROSCOPIC ONLY
Bacteria, UA: NONE SEEN
CASTS: NONE SEEN
CRYSTALS: NONE SEEN

## 2014-06-14 NOTE — Progress Notes (Signed)
Internal Medicine Clinic Attending  Case discussed with Dr. Jones soon after the resident saw the patient.  We reviewed the resident's history and exam and pertinent patient test results.  I agree with the assessment, diagnosis, and plan of care documented in the resident's note. 
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
## 2014-06-14 NOTE — Assessment & Plan Note (Signed)
Patient w/ neurogenic bladder, self-catheterization at home. Today, he states he is feeling slightly "under the weather" and noted a foul odor to his urine, states it has been cloudy, and has also had some abdominal discomfort. He states he has had several UTI's in the past and he knows when he is getting one. After review of previous culture data, he has had enterobacter cloacae w/ resistance to macrobid, cipro, and intermediate sensitivity to levaquin. He has also had UTI d/t E. Coli that was pan-sensitive. UA shows 11-20 WBC's.  -Urine culture pending -Will treat w/ Bactrim DS bid for 7 days for now -May change antimicrobial therapy if necessary pending culture data

## 2014-06-14 NOTE — Assessment & Plan Note (Signed)
No recent chest pain, does note some mild discomfort at night associated w/ anxiety and bad dreams. Says he frequently wakes up in the middle of the night in a mild state of panic, feels anxious. Most likely etiology of his chest pain is 2/2 anxiety and emotional distress. Previous cardiac workup negative.  -Restoril + Zoloft; see anxiety and depression section

## 2014-06-20 ENCOUNTER — Telehealth: Payer: Self-pay | Admitting: *Deleted

## 2014-06-20 NOTE — Telephone Encounter (Signed)
RN for [facility] calls and states pt needs a new shower chair, his is several years old and has been broken for quite awhile, she does not know what kind he has but states it is a special shower chair, will send to [facility] and dr Ellwood Dense

## 2014-06-21 NOTE — Telephone Encounter (Signed)
Please let me know the kind he usually uses, and I would be happy to prescribe.

## 2014-06-22 NOTE — Telephone Encounter (Signed)
Sent to [employer]

## 2014-07-03 ENCOUNTER — Telehealth: Payer: Self-pay | Admitting: *Deleted

## 2014-07-03 NOTE — Telephone Encounter (Signed)
Could we do 3? 

## 2014-07-04 MED ORDER — OXYCODONE HCL 15 MG PO TABS: 7.5000 mg | ORAL_TABLET | ORAL | Status: DC | PRN

## 2014-07-04 NOTE — Addendum Note (Signed)
Addended by: Madilyn Fireman on: 07/04/2014 04:59 PM   Modules accepted: Orders

## 2014-07-10 ENCOUNTER — Encounter: Payer: Self-pay | Admitting: Internal Medicine

## 2014-07-10 ENCOUNTER — Ambulatory Visit (INDEPENDENT_AMBULATORY_CARE_PROVIDER_SITE_OTHER): Payer: Medicaid Other | Admitting: Internal Medicine

## 2014-07-10 VITALS — BP 106/73 | HR 73 | Temp 97.8°F | Ht 70.0 in

## 2014-07-10 DIAGNOSIS — N390 Urinary tract infection, site not specified: Secondary | ICD-10-CM

## 2014-07-10 DIAGNOSIS — Z993 Dependence on wheelchair: Secondary | ICD-10-CM

## 2014-07-10 DIAGNOSIS — F329 Major depressive disorder, single episode, unspecified: Secondary | ICD-10-CM

## 2014-07-10 DIAGNOSIS — G8389 Other specified paralytic syndromes: Secondary | ICD-10-CM

## 2014-07-10 DIAGNOSIS — R0789 Other chest pain: Secondary | ICD-10-CM

## 2014-07-10 DIAGNOSIS — F418 Other specified anxiety disorders: Secondary | ICD-10-CM

## 2014-07-10 DIAGNOSIS — G8221 Paraplegia, complete: Secondary | ICD-10-CM

## 2014-07-10 DIAGNOSIS — F419 Anxiety disorder, unspecified: Principal | ICD-10-CM

## 2014-07-10 DIAGNOSIS — B9689 Other specified bacterial agents as the cause of diseases classified elsewhere: Secondary | ICD-10-CM

## 2014-07-10 MED ORDER — GABAPENTIN 300 MG PO CAPS: ORAL_CAPSULE | ORAL | Status: DC

## 2014-07-10 NOTE — Patient Instructions (Signed)
General Instructions: We will contact you for your appointment with the Psychologist.  I will call with the results of your Urine studies.  Please bring your medicines with you each time you come to clinic.  Medicines may include prescription medications, over-the-counter medications, herbal remedies, eye drops, vitamins, or other pills.   Progress Toward Treatment Goals:  No flowsheet data found.  Self Care Goals & Plans:  Self Care Goal 03/21/2014  Manage my medications take my medicines as prescribed; bring my medications to every visit; refill my medications on time  Stop smoking set a quit date and stop smoking    No flowsheet data found.   Care Management & Community Referrals:  No flowsheet data found.
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
## 2014-07-11 ENCOUNTER — Telehealth: Payer: Self-pay | Admitting: Licensed Clinical Social Worker

## 2014-07-11 LAB — URINALYSIS, ROUTINE W REFLEX MICROSCOPIC
Glucose, UA: NEGATIVE mg/dL
Ketones, ur: >80 mg/dL — AB
NITRITE: NEGATIVE
PROTEIN: 30 mg/dL — AB
Specific Gravity, Urine: >1.03 — ABNORMAL HIGH (ref 1.005–1.030)
Urobilinogen, UA: 1 mg/dL (ref 0.0–1.0)
pH: 6 (ref 5.0–8.0)

## 2014-07-11 LAB — URINALYSIS, MICROSCOPIC ONLY
Casts: NONE SEEN
Crystals: NONE SEEN
Squamous Epithelial / LPF: NONE SEEN
WBC, UA: >50 WBC/hpf — AB (ref <3)

## 2014-07-11 MED ORDER — SULFAMETHOXAZOLE-TRIMETHOPRIM 800-160 MG PO TABS: 1.0000 | ORAL_TABLET | Freq: Two times a day (BID) | ORAL | Status: DC

## 2014-07-11 NOTE — Telephone Encounter (Signed)
Joshua Park was referred to CSW for PTSD/Psychology referral.  Pt is insured, utilized [employer] and obtained therapist which specialize in PTSD.  Agencies: Development worker, international aid, [employer], [place].  Pt has no preference of agency and prefers CSW to schedule an afternoon appointment.  Call placed to [facility], message left.

## 2014-07-12 NOTE — Assessment & Plan Note (Addendum)
-   Patient still have some burning with urination - Obtain U/A Update:  U/A suggestive of UTI, given previous cultures (Ecoli x 2, then Enterobacter colace x2- with last one sensitive to bactrim)will treat again with 7 day course of bactrim and send urine culture. - Will adjust abx accordingly.  ADDENDUM 1/8>> cultures returned with Enterobacter colace now with resistance to bactrim but with sensitivity to flouroquinolones.  Will Rx Cipro extended course of 14 days.  Could not reach him by phone on 07/14/14, will send Rx to drug store and ask our nursing staff to follow up with him.
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
## 2014-07-12 NOTE — Assessment & Plan Note (Signed)
Please see chest tightness assessment and plan. -Referral to psychology - Continue zoloft.

## 2014-07-14 LAB — URINE CULTURE: Colony Count: >=100000

## 2014-07-14 MED ORDER — CIPROFLOXACIN HCL 500 MG PO TABS: 500.0000 mg | ORAL_TABLET | Freq: Two times a day (BID) | ORAL | Status: AC

## 2014-07-14 NOTE — Telephone Encounter (Signed)
CSW received return call from [facility].  The agency does accept pt's insurance and is w/c accessible.  CSW will fax referral today.

## 2014-07-14 NOTE — Addendum Note (Signed)
Addended by: Joni Reining C on: 07/14/2014 01:46 PM   Modules accepted: Orders

## 2014-07-14 NOTE — Progress Notes (Signed)
Internal Medicine Clinic Attending  Case discussed with Dr. Hoffman soon after the resident saw the patient.  We reviewed the resident's history and exam and pertinent patient test results.  I agree with the assessment, diagnosis, and plan of care documented in the resident's note. 

## 2014-08-08 NOTE — Telephone Encounter (Signed)
CSW placed call to Joshua Park to inquire if he has been scheduled at [facility] for counseling services.  Pt indicates he has yet to be contacted.  CSW placed call to [employer] and left message.

## 2014-08-11 NOTE — Telephone Encounter (Signed)
No response back from [facility] faxed referral to [facility] to move forward with pt accessing behavioral health services.

## 2014-08-16 ENCOUNTER — Other Ambulatory Visit: Payer: Self-pay | Admitting: Internal Medicine

## 2014-08-16 DIAGNOSIS — G8220 Paraplegia, unspecified: Secondary | ICD-10-CM

## 2014-08-16 NOTE — Telephone Encounter (Signed)
[facility] does not have openings until several months out.  CSW faxed referral to [facility].

## 2014-08-17 NOTE — Progress Notes (Signed)
Thank you.  Will notify [facility].

## 2014-08-22 NOTE — Telephone Encounter (Signed)
Pt no-showed his appointment at [facility] on 08/21/14.

## 2014-08-29 ENCOUNTER — Telehealth: Payer: Self-pay | Admitting: *Deleted

## 2014-08-29 NOTE — Telephone Encounter (Signed)
Thank you Edd Fabian. We will assess him tomorrow

## 2014-08-29 NOTE — Telephone Encounter (Signed)
Pt called with c/o elevated heart rate when sleeping. He is sleeping and it wakes him up.  He will get dizzy while flat in bed.  Onset 2 months ago. He had the same c/o while in the hospital, it has not resolved. Will see in clinic on 2/24 Pt is paraplegic

## 2014-08-29 NOTE — Telephone Encounter (Signed)
Pt no showed x2 at [facility], will not be rescheduled per their policy.  CSW will send Mr. Mcfadyen information on Homer C Jones for [employer] of providers.

## 2014-08-30 ENCOUNTER — Ambulatory Visit: Payer: Medicaid Other | Admitting: Internal Medicine

## 2014-08-31 ENCOUNTER — Ambulatory Visit (INDEPENDENT_AMBULATORY_CARE_PROVIDER_SITE_OTHER): Payer: Medicaid Other | Admitting: Internal Medicine

## 2014-08-31 ENCOUNTER — Encounter: Payer: Self-pay | Admitting: Internal Medicine

## 2014-08-31 VITALS — BP 127/83 | HR 60 | Temp 97.4°F

## 2014-08-31 DIAGNOSIS — R002 Palpitations: Secondary | ICD-10-CM

## 2014-08-31 MED ORDER — OXYCODONE HCL 15 MG PO TABS: 7.5000 mg | ORAL_TABLET | ORAL | Status: DC | PRN

## 2014-09-01 LAB — LIPID PANEL
Cholesterol: 185 mg/dL (ref 0–200)
HDL: 63 mg/dL (ref >=40)
LDL CALC: 105 mg/dL — AB (ref 0–99)
Total CHOL/HDL Ratio: 2.9 Ratio
Triglycerides: 87 mg/dL (ref <150)
VLDL: 17 mg/dL (ref 0–40)

## 2014-09-01 NOTE — Patient Instructions (Signed)
General Instructions:   Thank you for bringing your medicines today. This helps [place] keep you safe from mistakes.   Progress Toward Treatment Goals:  No flowsheet data found.  Self Care Goals & Plans:  Self Care Goal 03/21/2014  Manage my medications take my medicines as prescribed; bring my medications to every visit; refill my medications on time  Stop smoking set a quit date and stop smoking    No flowsheet data found.   Care Management & Community Referrals:  No flowsheet data found.
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
## 2014-09-01 NOTE — Assessment & Plan Note (Signed)
There has been some workup in the past including TSH and basic labs that were all normal. EKG had some slight T-wave flattening but no other aberrant arrhythmias. It does not appear the patient has had other risk stratification such as a lipid panel given his risk factors. Patient was asystematic during this visit therefore EKG was not repeated. -Referral to cardiology for event monitoring -Patient will also need outpatient stress test likely chemical given his paraplegia -Continue with counseling if this is related to anxiety -f/u if pain worsens or events become more frequent

## 2014-09-04 ENCOUNTER — Emergency Department (HOSPITAL_COMMUNITY): Payer: Medicaid Other

## 2014-09-04 ENCOUNTER — Emergency Department (HOSPITAL_COMMUNITY)
Admission: EM | Admit: 2014-09-04 | Discharge: 2014-09-04 | Disposition: A | Discharge status: Home / Self Care | Payer: Medicaid Other | Attending: Emergency Medicine | Admitting: Emergency Medicine

## 2014-09-04 ENCOUNTER — Encounter (HOSPITAL_COMMUNITY): Payer: Self-pay | Admitting: *Deleted

## 2014-09-04 DIAGNOSIS — Z87440 Personal history of urinary (tract) infections: Secondary | ICD-10-CM | POA: Diagnosis not present

## 2014-09-04 DIAGNOSIS — K219 Gastro-esophageal reflux disease without esophagitis: Secondary | ICD-10-CM | POA: Diagnosis not present

## 2014-09-04 DIAGNOSIS — Z86718 Personal history of other venous thrombosis and embolism: Secondary | ICD-10-CM | POA: Diagnosis not present

## 2014-09-04 DIAGNOSIS — F419 Anxiety disorder, unspecified: Secondary | ICD-10-CM | POA: Diagnosis not present

## 2014-09-04 DIAGNOSIS — R0789 Other chest pain: Secondary | ICD-10-CM

## 2014-09-04 DIAGNOSIS — F329 Major depressive disorder, single episode, unspecified: Secondary | ICD-10-CM | POA: Diagnosis not present

## 2014-09-04 DIAGNOSIS — Z79899 Other long term (current) drug therapy: Secondary | ICD-10-CM | POA: Insufficient documentation

## 2014-09-04 DIAGNOSIS — Z87891 Personal history of nicotine dependence: Secondary | ICD-10-CM | POA: Insufficient documentation

## 2014-09-04 DIAGNOSIS — Z872 Personal history of diseases of the skin and subcutaneous tissue: Secondary | ICD-10-CM | POA: Insufficient documentation

## 2014-09-04 DIAGNOSIS — Z862 Personal history of diseases of the blood and blood-forming organs and certain disorders involving the immune mechanism: Secondary | ICD-10-CM | POA: Insufficient documentation

## 2014-09-04 DIAGNOSIS — Z8611 Personal history of tuberculosis: Secondary | ICD-10-CM | POA: Insufficient documentation

## 2014-09-04 DIAGNOSIS — G8220 Paraplegia, unspecified: Secondary | ICD-10-CM | POA: Insufficient documentation

## 2014-09-04 DIAGNOSIS — R079 Chest pain, unspecified: Secondary | ICD-10-CM | POA: Diagnosis present

## 2014-09-04 LAB — CBC
HEMATOCRIT: 40.1 % (ref 39.0–52.0)
Hemoglobin: 13.6 g/dL (ref 13.0–17.0)
MCH: 30.4 pg (ref 26.0–34.0)
MCHC: 33.9 g/dL (ref 30.0–36.0)
MCV: 89.5 fL (ref 78.0–100.0)
Platelets: 212 10*3/uL (ref 150–400)
RBC: 4.48 MIL/uL (ref 4.22–5.81)
RDW: 13.2 % (ref 11.5–15.5)
WBC: 5.2 10*3/uL (ref 4.0–10.5)

## 2014-09-04 LAB — BASIC METABOLIC PANEL
Anion gap: 7 (ref 5–15)
BUN: 8 mg/dL (ref 6–23)
CO2: 29 mmol/L (ref 19–32)
CREATININE: 0.77 mg/dL (ref 0.50–1.35)
Calcium: 9.7 mg/dL (ref 8.4–10.5)
Chloride: 100 mmol/L (ref 96–112)
GFR calc Af Amer: >90 mL/min (ref >90)
GFR calc non Af Amer: >90 mL/min (ref >90)
Glucose, Bld: 109 mg/dL — ABNORMAL HIGH (ref 70–99)
Potassium: 3.4 mmol/L — ABNORMAL LOW (ref 3.5–5.1)
Sodium: 136 mmol/L (ref 135–145)

## 2014-09-04 LAB — BRAIN NATRIURETIC PEPTIDE: B Natriuretic Peptide: 9.5 pg/mL (ref 0.0–100.0)

## 2014-09-04 LAB — TROPONIN I

## 2014-09-04 NOTE — ED Notes (Signed)
The pt has had lt lateral chest pain for 2-3 days with sob.  He is supposed to have a stress test soon.  No distress
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
## 2014-09-04 NOTE — Discharge Instructions (Signed)
Talk with your doctor about getting a referral to see a cardiologist.   Chest Pain (Nonspecific) It is often hard to give a specific diagnosis for the cause of chest pain. There is always a chance that your pain could be related to something serious, such as a heart attack or a blood clot in the lungs. You need to follow up with your health care provider for further evaluation. CAUSES   Heartburn.  Pneumonia or bronchitis.  Anxiety or stress.  Inflammation around your heart (pericarditis) or lung (pleuritis or pleurisy).  A blood clot in the lung.  A collapsed lung (pneumothorax). It can develop suddenly on its own (spontaneous pneumothorax) or from trauma to the chest.  Shingles infection (herpes zoster virus). The chest wall is composed of bones, muscles, and cartilage. Any of these can be the source of the pain.  The bones can be bruised by injury.  The muscles or cartilage can be strained by coughing or overwork.  The cartilage can be affected by inflammation and become sore (costochondritis). DIAGNOSIS  Lab tests or other studies may be needed to find the cause of your pain. Your health care provider may have you take a test called an ambulatory electrocardiogram (ECG). An ECG records your heartbeat patterns over a 24-hour period. You may also have other tests, such as:  Transthoracic echocardiogram (TTE). During echocardiography, sound waves are used to evaluate how blood flows through your heart.  Transesophageal echocardiogram (TEE).  Cardiac monitoring. This allows your health care provider to monitor your heart rate and rhythm in real time.  Holter monitor. This is a portable device that records your heartbeat and can help diagnose heart arrhythmias. It allows your health care provider to track your heart activity for several days, if needed.  Stress tests by exercise or by giving medicine that makes the heart beat faster. TREATMENT   Treatment depends on what may  be causing your chest pain. Treatment may include:  Acid blockers for heartburn.  Anti-inflammatory medicine.  Pain medicine for inflammatory conditions.  Antibiotics if an infection is present.  You may be advised to change lifestyle habits. This includes stopping smoking and avoiding alcohol, caffeine, and chocolate.  You may be advised to keep your head raised (elevated) when sleeping. This reduces the chance of acid going backward from your stomach into your esophagus. Most of the time, nonspecific chest pain will improve within 2-3 days with rest and mild pain medicine.  HOME CARE INSTRUCTIONS   If antibiotics were prescribed, take them as directed. Finish them even if you start to feel better.  For the next few days, avoid physical activities that bring on chest pain. Continue physical activities as directed.  Do not use any tobacco products, including cigarettes, chewing tobacco, or electronic cigarettes.  Avoid drinking alcohol.  Only take medicine as directed by your health care provider.  Follow your health care provider's suggestions for further testing if your chest pain does not go away.  Keep any follow-up appointments you made. If you do not go to an appointment, you could develop lasting (chronic) problems with pain. If there is any problem keeping an appointment, call to reschedule. SEEK MEDICAL CARE IF:   Your chest pain does not go away, even after treatment.  You have a rash with blisters on your chest.  You have a fever. SEEK IMMEDIATE MEDICAL CARE IF:   You have increased chest pain or pain that spreads to your arm, neck, jaw, back, or abdomen.  You have shortness of breath.  You have an increasing cough, or you cough up blood.  You have severe back or abdominal pain.  You feel nauseous or vomit.  You have severe weakness.  You faint.  You have chills. This is an emergency. Do not wait to see if the pain will go away. Get medical help at  once. Call your local emergency services (911 in [place] Do not drive yourself to the hospital. MAKE SURE YOU:   Understand these instructions.  Will watch your condition.  Will get help right away if you are not doing well or get worse. Document Released: 04/02/2005 Document Revised: 06/28/2013 Document Reviewed: 01/27/2008 [facility] Patient Information 2015 [place], [place]. This information is not intended to replace advice given to you by your health care provider. Make sure you discuss any questions you have with your health care provider.

## 2014-09-05 NOTE — Progress Notes (Signed)
Internal Medicine Clinic Attending  Case discussed with Dr. Sadek soon after the resident saw the patient.  We reviewed the resident's history and exam and pertinent patient test results.  I agree with the assessment, diagnosis, and plan of care documented in the resident's note. 

## 2014-09-07 ENCOUNTER — Telehealth: Payer: Self-pay | Admitting: *Deleted

## 2014-09-07 NOTE — Telephone Encounter (Signed)
Call from [place] with [facility]  - # [phone]  Nurse is asking for a order for a new hospital bed.  Fully electric. CAP manager feels it's time.  Fax # [phone]

## 2014-09-27 ENCOUNTER — Other Ambulatory Visit: Payer: Self-pay | Admitting: *Deleted

## 2014-09-27 MED ORDER — OXYCODONE HCL 15 MG PO TABS: 7.5000 mg | ORAL_TABLET | ORAL | Status: DC | PRN

## 2014-09-27 NOTE — Telephone Encounter (Signed)
Last refill 2/25 Call pt when ready @ [phone]

## 2014-10-02 ENCOUNTER — Institutional Professional Consult (permissible substitution): Payer: Medicaid Other | Admitting: Cardiovascular Disease

## 2014-10-04 ENCOUNTER — Ambulatory Visit (INDEPENDENT_AMBULATORY_CARE_PROVIDER_SITE_OTHER): Payer: Medicaid Other | Admitting: Internal Medicine

## 2014-10-04 ENCOUNTER — Encounter: Payer: Self-pay | Admitting: Internal Medicine

## 2014-10-04 VITALS — BP 119/70 | HR 60 | Temp 97.6°F

## 2014-10-04 DIAGNOSIS — Z0000 Encounter for general adult medical examination without abnormal findings: Secondary | ICD-10-CM

## 2014-10-04 DIAGNOSIS — G8220 Paraplegia, unspecified: Secondary | ICD-10-CM

## 2014-10-04 MED ORDER — GABAPENTIN 300 MG PO CAPS: ORAL_CAPSULE | ORAL | Status: DC
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
## 2014-10-04 NOTE — Progress Notes (Signed)
Case discussed with Dr. Rothman at time of visit. We reviewed the resident's history and exam and pertinent patient test results. I agree with the assessment, diagnosis, and plan of care documented in the resident's note. 
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
## 2014-10-04 NOTE — Patient Instructions (Signed)
It was a pleasure to see you today. Please call if issues getting the hospital bed. Please return to clinic or seek medical attention if you have any new or worsening issues. We look forward to seeing you again soon.  Lottie Mussel, MD  General Instructions:   Thank you for bringing your medicines today. This helps [place] keep you safe from mistakes.   Progress Toward Treatment Goals:  No flowsheet data found.  Self Care Goals & Plans:  Self Care Goal 03/21/2014  Manage my medications take my medicines as prescribed; bring my medications to every visit; refill my medications on time  Stop smoking set a quit date and stop smoking    No flowsheet data found.   Care Management & Community Referrals:  No flowsheet data found.

## 2014-10-12 ENCOUNTER — Telehealth: Payer: Self-pay | Admitting: Internal Medicine

## 2014-10-12 NOTE — Telephone Encounter (Signed)
Orders are @ [facility] for new Hospital Bed.  If the patient has any questions regards his DME order please have him call [facility] @ [phone] and ask for the DME department.

## 2014-10-18 ENCOUNTER — Telehealth: Payer: Self-pay | Admitting: *Deleted

## 2014-10-18 NOTE — Telephone Encounter (Signed)
Pt called - has another UTI - urine is cloudy and has blood. Pt does self cath. There are no open appt this week in clinic. Suggest to go to Urgent Care or ER - pt states he does not feel well. Hilda Blades Jenilyn Magana RN 10/18/14 4PM

## 2014-11-09 ENCOUNTER — Ambulatory Visit: Payer: Medicaid Other | Admitting: Internal Medicine

## 2014-11-10 ENCOUNTER — Ambulatory Visit (INDEPENDENT_AMBULATORY_CARE_PROVIDER_SITE_OTHER): Payer: Medicaid Other | Admitting: Cardiovascular Disease

## 2014-11-10 ENCOUNTER — Telehealth: Payer: Self-pay | Admitting: *Deleted

## 2014-11-10 ENCOUNTER — Encounter: Payer: Self-pay | Admitting: Cardiovascular Disease

## 2014-11-10 ENCOUNTER — Ambulatory Visit (INDEPENDENT_AMBULATORY_CARE_PROVIDER_SITE_OTHER): Payer: Medicaid Other | Admitting: Internal Medicine

## 2014-11-10 ENCOUNTER — Encounter: Payer: Self-pay | Admitting: Internal Medicine

## 2014-11-10 VITALS — BP 94/60 | HR 68 | Ht 71.0 in | Wt 140.0 lb

## 2014-11-10 VITALS — BP 113/77 | HR 64 | Temp 97.7°F

## 2014-11-10 DIAGNOSIS — H9202 Otalgia, left ear: Secondary | ICD-10-CM

## 2014-11-10 DIAGNOSIS — R002 Palpitations: Secondary | ICD-10-CM

## 2014-11-10 DIAGNOSIS — G8220 Paraplegia, unspecified: Secondary | ICD-10-CM | POA: Diagnosis present

## 2014-11-10 DIAGNOSIS — H6122 Impacted cerumen, left ear: Secondary | ICD-10-CM

## 2014-11-10 MED ORDER — GABAPENTIN 100 MG PO CAPS: 100.0000 mg | ORAL_CAPSULE | Freq: Four times a day (QID) | ORAL | Status: DC

## 2014-11-10 NOTE — Telephone Encounter (Signed)
Pt due to transportation arrived late, he has been having "racing heart" at night after taking the gabapentin in recent times also he has ear pain. Placed in dr moding's [id number]

## 2014-11-10 NOTE — Telephone Encounter (Signed)
Agreed, thanks

## 2014-11-10 NOTE — Patient Instructions (Addendum)
Thank you for coming to clinic today [name].  General instructions: -I sent a new prescription with a lower dose of gabapentin to your pharmacy. -Your ear should continue to feel better now that we have removed the wax.  If your pain gets worse or doesn't go away in the next week come back to clinic. -Make sure to follow up with cardiology to get your cardiac event monitor next week. -Please make a follow up appointment to return to clinic in 3 months.  Please bring your medicines with you each time you come.   Medicines may be  Eye drops  Herbal   Vitamins  Pills  Seeing these help [place] take care of you.

## 2014-11-10 NOTE — Assessment & Plan Note (Signed)
Likely secondary to cerumen impaction. The cerumen was irrigated from his left ear with significant improvement in his hearing and pain. Does report some mild residual pain, and there is a small amount of irritation of his ear canal at the site of his impaction possibly due to the cuvette used for cerumen removal. I suspect this irritation will resolve on its own. -Observation. -Told patient to return to clinic if he has worsening pain or his pain does not resolve in the next week.
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
## 2014-11-10 NOTE — Progress Notes (Signed)
Internal Medicine Clinic Attending  Case discussed with Dr. Moding at the time of the visit.  We reviewed the resident's history and exam and pertinent patient test results.  I agree with the assessment, diagnosis, and plan of care documented in the resident's note. 

## 2014-11-10 NOTE — Assessment & Plan Note (Signed)
This has been a chronic issue for him. I suspect anxiety versus intermittent arrhythmia. Seen by cardiology today, but no note completed currently. I agree with monitoring him with an event monitor to make sure he is not having an arrhythmia during his palpitations. -Continue to follow up with cardiology. -Continue current treatment for anxiety.

## 2014-11-10 NOTE — Telephone Encounter (Signed)
appt changed to 1545 due to cardiology visit

## 2014-11-10 NOTE — Patient Instructions (Signed)
Medication Instructions:  Your physician recommends that you continue on your current medications as directed. Please refer to the Current Medication list given to you today.   Labwork: None  Testing/Procedures: Your physician has recommended that you wear an event monitor. Event monitors are medical devices that record the heart's electrical activity. Doctors most often [place] these monitors to diagnose arrhythmias. Arrhythmias are problems with the speed or rhythm of the heartbeat. The monitor is a small, portable device. You can wear one while you do your normal daily activities. This is usually used to diagnose what is causing palpitations/syncope (passing out).    Follow-Up: Your physician recommends that you schedule a follow-up appointment in: as needed with Dr. Acie Fredrickson
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
## 2014-11-10 NOTE — Assessment & Plan Note (Addendum)
The patient is requesting that his gabapentin the reduced due to increased side effects. He has been on this medication for several years. -Reduce gabapentin to 100 mg 4 times per day. -Patient will contact PCP if symptoms not controlled on lower dose.

## 2014-11-27 ENCOUNTER — Other Ambulatory Visit: Payer: Self-pay | Admitting: *Deleted

## 2014-11-27 DIAGNOSIS — G8929 Other chronic pain: Secondary | ICD-10-CM

## 2014-11-27 DIAGNOSIS — R300 Dysuria: Secondary | ICD-10-CM

## 2014-11-27 MED ORDER — OXYCODONE HCL 15 MG PO TABS: 7.5000 mg | ORAL_TABLET | ORAL | Status: DC | PRN

## 2014-11-27 NOTE — Telephone Encounter (Signed)
Last visit 08/31/14 Last RX 4/23

## 2014-11-27 NOTE — Addendum Note (Signed)
Addended by: Larey Dresser A on: 11/27/2014 11:05 AM   Modules accepted: Orders

## 2014-11-27 NOTE — Telephone Encounter (Addendum)
FYI not accurate. Need for opioid not addressed for 6 months. Last UDS > 1 yr. [place] database OK.  Will fill for only 30 days, obtain UDS, and refer back to PCP

## 2014-11-28 ENCOUNTER — Ambulatory Visit (INDEPENDENT_AMBULATORY_CARE_PROVIDER_SITE_OTHER): Payer: Medicaid Other

## 2014-11-28 ENCOUNTER — Other Ambulatory Visit: Payer: Medicaid Other

## 2014-11-28 DIAGNOSIS — R002 Palpitations: Secondary | ICD-10-CM | POA: Diagnosis not present

## 2014-11-28 DIAGNOSIS — R300 Dysuria: Secondary | ICD-10-CM

## 2014-11-28 DIAGNOSIS — G8929 Other chronic pain: Secondary | ICD-10-CM

## 2014-11-28 NOTE — Telephone Encounter (Signed)
C/O dysuria and freq and "knows he has a UTI." Self caths and has had freq ABX for UTI. Most recent UTI Jan 2016. Will tx if cx is +.

## 2014-11-28 NOTE — Addendum Note (Signed)
Addended by: Larey Dresser A on: 11/28/2014 04:40 PM   Modules accepted: Orders

## 2014-11-29 LAB — PRESCRIPTION ABUSE MONITORING 15P, URINE
AMPHETAMINE/METH: NEGATIVE ng/mL
Barbiturate Screen, Urine: NEGATIVE ng/mL
Benzodiazepine Screen, Urine: NEGATIVE ng/mL
Buprenorphine, Urine: NEGATIVE ng/mL
COCAINE METABOLITES: NEGATIVE ng/mL
Cannabinoid Scrn, Ur: NEGATIVE ng/mL
Carisoprodol, Urine: NEGATIVE ng/mL
Creatinine, Urine: 55.09 mg/dL (ref >20.0)
FENTANYL URINE: NEGATIVE ng/mL
Meperidine, Ur: NEGATIVE ng/mL
Methadone Screen, Urine: NEGATIVE ng/mL
Opiate Screen, Urine: NEGATIVE ng/mL
PROPOXYPHENE: NEGATIVE ng/mL
Tramadol Scrn, Ur: NEGATIVE ng/mL
Zolpidem, Urine: NEGATIVE ng/mL

## 2014-11-29 LAB — URINALYSIS, ROUTINE W REFLEX MICROSCOPIC
BILIRUBIN URINE: NEGATIVE
GLUCOSE, UA: NEGATIVE mg/dL
Hgb urine dipstick: NEGATIVE
KETONES UR: NEGATIVE mg/dL
Leukocytes, UA: NEGATIVE
NITRITE: NEGATIVE
PH: 6 (ref 5.0–8.0)
PROTEIN: NEGATIVE mg/dL
Specific Gravity, Urine: 1.007 (ref 1.005–1.030)
Urobilinogen, UA: 0.2 mg/dL (ref 0.0–1.0)

## 2014-11-29 LAB — URINE CULTURE
COLONY COUNT: NO GROWTH
ORGANISM ID, BACTERIA: NO GROWTH

## 2014-12-02 LAB — OXYCODONE, URINE (LC/MS-MS)
Noroxycodone, Ur: 791 ng/mL — ABNORMAL HIGH (ref <50)
OXYMORPHONE, URINE: 379 ng/mL — AB (ref <50)
Oxycodone, ur: 461 ng/mL — ABNORMAL HIGH (ref <50)

## 2014-12-11 ENCOUNTER — Encounter (HOSPITAL_COMMUNITY): Payer: Self-pay | Admitting: Family Medicine

## 2014-12-11 ENCOUNTER — Emergency Department (HOSPITAL_COMMUNITY): Payer: Medicaid Other

## 2014-12-11 ENCOUNTER — Emergency Department (HOSPITAL_COMMUNITY)
Admission: EM | Admit: 2014-12-11 | Discharge: 2014-12-11 | Disposition: A | Discharge status: Home / Self Care | Payer: Medicaid Other | Attending: Emergency Medicine | Admitting: Emergency Medicine

## 2014-12-11 DIAGNOSIS — F419 Anxiety disorder, unspecified: Secondary | ICD-10-CM | POA: Insufficient documentation

## 2014-12-11 DIAGNOSIS — K219 Gastro-esophageal reflux disease without esophagitis: Secondary | ICD-10-CM | POA: Insufficient documentation

## 2014-12-11 DIAGNOSIS — Z8611 Personal history of tuberculosis: Secondary | ICD-10-CM | POA: Insufficient documentation

## 2014-12-11 DIAGNOSIS — R8271 Bacteriuria: Secondary | ICD-10-CM

## 2014-12-11 DIAGNOSIS — F329 Major depressive disorder, single episode, unspecified: Secondary | ICD-10-CM | POA: Diagnosis not present

## 2014-12-11 DIAGNOSIS — R0602 Shortness of breath: Secondary | ICD-10-CM

## 2014-12-11 DIAGNOSIS — N390 Urinary tract infection, site not specified: Secondary | ICD-10-CM | POA: Insufficient documentation

## 2014-12-11 DIAGNOSIS — Z79899 Other long term (current) drug therapy: Secondary | ICD-10-CM | POA: Insufficient documentation

## 2014-12-11 DIAGNOSIS — Z87440 Personal history of urinary (tract) infections: Secondary | ICD-10-CM | POA: Insufficient documentation

## 2014-12-11 DIAGNOSIS — Z87891 Personal history of nicotine dependence: Secondary | ICD-10-CM | POA: Insufficient documentation

## 2014-12-11 DIAGNOSIS — Z862 Personal history of diseases of the blood and blood-forming organs and certain disorders involving the immune mechanism: Secondary | ICD-10-CM | POA: Insufficient documentation

## 2014-12-11 DIAGNOSIS — R531 Weakness: Secondary | ICD-10-CM

## 2014-12-11 DIAGNOSIS — R55 Syncope and collapse: Secondary | ICD-10-CM | POA: Diagnosis present

## 2014-12-11 DIAGNOSIS — Z86718 Personal history of other venous thrombosis and embolism: Secondary | ICD-10-CM | POA: Diagnosis not present

## 2014-12-11 LAB — BASIC METABOLIC PANEL
ANION GAP: 6 (ref 5–15)
BUN: 10 mg/dL (ref 6–20)
CHLORIDE: 104 mmol/L (ref 101–111)
CO2: 26 mmol/L (ref 22–32)
CREATININE: 0.76 mg/dL (ref 0.61–1.24)
Calcium: 9.6 mg/dL (ref 8.9–10.3)
GFR calc Af Amer: >60 mL/min (ref >60)
GFR calc non Af Amer: >60 mL/min (ref >60)
GLUCOSE: 92 mg/dL (ref 65–99)
POTASSIUM: 3.8 mmol/L (ref 3.5–5.1)
Sodium: 136 mmol/L (ref 135–145)

## 2014-12-11 LAB — CBC
HEMATOCRIT: 43 % (ref 39.0–52.0)
Hemoglobin: 14.9 g/dL (ref 13.0–17.0)
MCH: 30.7 pg (ref 26.0–34.0)
MCHC: 34.7 g/dL (ref 30.0–36.0)
MCV: 88.5 fL (ref 78.0–100.0)
PLATELETS: 192 10*3/uL (ref 150–400)
RBC: 4.86 MIL/uL (ref 4.22–5.81)
RDW: 12.9 % (ref 11.5–15.5)
WBC: 4.5 10*3/uL (ref 4.0–10.5)

## 2014-12-11 LAB — I-STAT TROPONIN, ED: Troponin i, poc: 0 ng/mL (ref 0.00–0.08)

## 2014-12-11 LAB — URINE MICROSCOPIC-ADD ON

## 2014-12-11 LAB — URINALYSIS, ROUTINE W REFLEX MICROSCOPIC
Glucose, UA: NEGATIVE mg/dL
Hgb urine dipstick: NEGATIVE
Ketones, ur: 15 mg/dL — AB
Nitrite: NEGATIVE
Protein, ur: NEGATIVE mg/dL
SPECIFIC GRAVITY, URINE: 1.025 (ref 1.005–1.030)
UROBILINOGEN UA: 0.2 mg/dL (ref 0.0–1.0)
pH: 5.5 (ref 5.0–8.0)

## 2014-12-11 LAB — BRAIN NATRIURETIC PEPTIDE: B Natriuretic Peptide: 2.3 pg/mL (ref 0.0–100.0)

## 2014-12-11 MED ORDER — CEPHALEXIN 250 MG PO CAPS
500.0000 mg | ORAL_CAPSULE | ORAL | Status: AC
Start: 2014-12-11 — End: 2014-12-11
  Administered 2014-12-11: 500 mg via ORAL
  Filled 2014-12-11: qty 2

## 2014-12-11 MED ORDER — SODIUM CHLORIDE 0.9 % IV BOLUS (SEPSIS)
1000.0000 mL | INTRAVENOUS | Status: AC
  Administered 2014-12-11: 1000 mL via INTRAVENOUS

## 2014-12-11 MED ORDER — CEPHALEXIN 500 MG PO CAPS: 500.0000 mg | ORAL_CAPSULE | Freq: Three times a day (TID) | ORAL | Status: DC

## 2014-12-11 NOTE — ED Notes (Signed)
Pt here for heart racing, SOB and feels like he is going pass out.
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
## 2014-12-11 NOTE — ED Notes (Signed)
In & Out not inserted due to foley cath being inserted per MD order

## 2014-12-13 ENCOUNTER — Ambulatory Visit (INDEPENDENT_AMBULATORY_CARE_PROVIDER_SITE_OTHER): Payer: Medicaid Other | Admitting: Internal Medicine

## 2014-12-13 ENCOUNTER — Encounter: Payer: Self-pay | Admitting: Internal Medicine

## 2014-12-13 VITALS — BP 103/69 | HR 70 | Temp 98.0°F

## 2014-12-13 DIAGNOSIS — G8220 Paraplegia, unspecified: Secondary | ICD-10-CM | POA: Diagnosis not present

## 2014-12-13 DIAGNOSIS — Z86718 Personal history of other venous thrombosis and embolism: Secondary | ICD-10-CM | POA: Diagnosis not present

## 2014-12-13 DIAGNOSIS — F419 Anxiety disorder, unspecified: Secondary | ICD-10-CM

## 2014-12-13 DIAGNOSIS — Q559 Congenital malformation of male genital organ, unspecified: Secondary | ICD-10-CM

## 2014-12-13 DIAGNOSIS — Q5520 Unspecified congenital malformations of testis and scrotum: Secondary | ICD-10-CM | POA: Insufficient documentation

## 2014-12-13 DIAGNOSIS — N390 Urinary tract infection, site not specified: Secondary | ICD-10-CM

## 2014-12-13 DIAGNOSIS — F418 Other specified anxiety disorders: Secondary | ICD-10-CM | POA: Diagnosis present

## 2014-12-13 DIAGNOSIS — Z87440 Personal history of urinary (tract) infections: Secondary | ICD-10-CM

## 2014-12-13 DIAGNOSIS — F17210 Nicotine dependence, cigarettes, uncomplicated: Secondary | ICD-10-CM

## 2014-12-13 DIAGNOSIS — N319 Neuromuscular dysfunction of bladder, unspecified: Secondary | ICD-10-CM

## 2014-12-13 DIAGNOSIS — K5900 Constipation, unspecified: Secondary | ICD-10-CM

## 2014-12-13 DIAGNOSIS — R531 Weakness: Secondary | ICD-10-CM

## 2014-12-13 DIAGNOSIS — F32A Depression, unspecified: Secondary | ICD-10-CM

## 2014-12-13 DIAGNOSIS — F329 Major depressive disorder, single episode, unspecified: Secondary | ICD-10-CM

## 2014-12-13 DIAGNOSIS — K5909 Other constipation: Secondary | ICD-10-CM

## 2014-12-13 LAB — URINE CULTURE
Colony Count: NO GROWTH
Culture: NO GROWTH

## 2014-12-13 LAB — TSH: TSH: 0.897 u[IU]/mL (ref 0.350–4.500)

## 2014-12-13 MED ORDER — CLONAZEPAM 0.5 MG PO TABS
0.2500 mg | ORAL_TABLET | Freq: Every day | ORAL | Status: DC | PRN
Start: 2014-12-13 — End: 2015-03-19

## 2014-12-13 MED ORDER — SERTRALINE HCL 50 MG PO TABS: 50.0000 mg | ORAL_TABLET | Freq: Every day | ORAL | Status: DC

## 2014-12-13 MED ORDER — SORBITOL 70 % PO SOLN
30.0000 mL | Freq: Every day | ORAL | Status: DC | PRN
Start: 2014-12-13 — End: 2014-12-22

## 2014-12-13 MED ORDER — DOCUSATE SODIUM 100 MG PO CAPS: 100.0000 mg | ORAL_CAPSULE | Freq: Every day | ORAL | Status: DC | PRN

## 2014-12-13 NOTE — Assessment & Plan Note (Signed)
Patient has not been treating his chronic constipation. This constipation is likely secondary to his chronic opioid use. He has some abdominal discomfort as a result. Bowel sounds present on exam, slightly tender to palpation diffusely.  - Colace 100 mg by mouth daily PRN - Sorbitol 70% solution 30 mL by mouth daily as needed (patient cautioned to stop if stools become very soft)

## 2014-12-13 NOTE — Assessment & Plan Note (Signed)
Patient's gabapentin was reduced at last visit (1 month ago) per his request (he stated that the medication made him feel "weird"). Though he feels some occasional, burning pain on the new dose, he prefers the lower dose.  - Continue with gabapentin 100 mg 4 times per day

## 2014-12-13 NOTE — Assessment & Plan Note (Signed)
Patient has a history of recurrent UTIs and a neurogenic bladder. He was started on keflex in the ED after his UA resulted with moderate leukocytes, but his urine culture had no growth.  - Discontinuing keflex; reasoning explained to patient

## 2014-12-13 NOTE — Assessment & Plan Note (Signed)
Patient noticed a small bump in his scrotum that was tender to palpation years ago. It still exists today (though he had a difficult time locating it during the exam). His anxiety about his health may be contributing to his concern over this today.  - Consider urology referral once patient's anxiety is under control if he continues to notice this pain; saw Dr. Karsten Ro in the past

## 2014-12-13 NOTE — Patient Instructions (Signed)
STOP taking the Keflex, as you do not have an infection.  START taking Zoloft once per day. Like we discussed, you may not notice any effects of this medication for 4 to 6 weeks.  In the meantime, if you are feeling anxious, take a tablet of Klonopin. You can take 1-2 tablets at a time. Once the Zoloft has kicked in (in 1-2 months), we will taper you off of the Klonopin (THIS IS NOT A LONG TERM MEDICATION).  If your scrotum continues to bother you when you touch it, we can consider sending you back to Dr. Karsten Ro in the future.  General Instructions:   Thank you for bringing your medicines today. This helps [place] keep you safe from mistakes.   Progress Toward Treatment Goals:  No flowsheet data found.  Self Care Goals & Plans:  Self Care Goal 03/21/2014  Manage my medications take my medicines as prescribed; bring my medications to every visit; refill my medications on time  Stop smoking set a quit date and stop smoking    No flowsheet data found.   Care Management & Community Referrals:  No flowsheet data found.

## 2014-12-13 NOTE — Assessment & Plan Note (Addendum)
Patient has a significant history of anxiety and depression. Today, he complains of fatigue and feeling very anxious. He has been started on Zoloft in the past, but has never taken it for more than a few days. Today, he has significant anxiety on exam; he is very worried about his health, about taking medications, about his scrotum, about whether the doctors are missing something. He could benefit from long term SSRI but also needs anxiety treatment in the short term. A PE is also on the differential, with his apparent "feeling of doom"; he does have a history of a DVT with IVC placement in the distant past. However, this is less likely, given his pulse of 70, lack of chest pain and history of anxiety.  - Restart Zoloft; patient advised that he may not feel the effects for 4-6 weeks - Klonopin 0.25 mg PRN in the meantime; patient told that this is only a short term prescription until the Zoloft takes effect (given 30 tablets) - Consider imaging to rule out PE if symptoms persist despite compliance with treatment - TSH today   Addendum: TSH WNL Patient called with these results
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
## 2014-12-14 NOTE — Progress Notes (Signed)
Internal Medicine Clinic Attending  Case discussed with Dr. Mallory at the time of the visit.  We reviewed the resident's history and exam and pertinent patient test results.  I agree with the assessment, diagnosis, and plan of care documented in the resident's note. 

## 2014-12-15 ENCOUNTER — Emergency Department (HOSPITAL_COMMUNITY)
Admission: EM | Admit: 2014-12-15 | Discharge: 2014-12-15 | Disposition: A | Discharge status: Home / Self Care | Payer: Medicaid Other | Attending: Emergency Medicine | Admitting: Emergency Medicine

## 2014-12-15 ENCOUNTER — Encounter (HOSPITAL_COMMUNITY): Payer: Self-pay | Admitting: Emergency Medicine

## 2014-12-15 ENCOUNTER — Emergency Department (HOSPITAL_COMMUNITY): Payer: Medicaid Other

## 2014-12-15 DIAGNOSIS — N508 Other specified disorders of male genital organs: Secondary | ICD-10-CM | POA: Diagnosis not present

## 2014-12-15 DIAGNOSIS — Z872 Personal history of diseases of the skin and subcutaneous tissue: Secondary | ICD-10-CM | POA: Insufficient documentation

## 2014-12-15 DIAGNOSIS — Z8611 Personal history of tuberculosis: Secondary | ICD-10-CM | POA: Insufficient documentation

## 2014-12-15 DIAGNOSIS — R630 Anorexia: Secondary | ICD-10-CM | POA: Diagnosis not present

## 2014-12-15 DIAGNOSIS — Z9289 Personal history of other medical treatment: Secondary | ICD-10-CM | POA: Insufficient documentation

## 2014-12-15 DIAGNOSIS — Z87440 Personal history of urinary (tract) infections: Secondary | ICD-10-CM | POA: Diagnosis not present

## 2014-12-15 DIAGNOSIS — Z862 Personal history of diseases of the blood and blood-forming organs and certain disorders involving the immune mechanism: Secondary | ICD-10-CM | POA: Diagnosis not present

## 2014-12-15 DIAGNOSIS — Z792 Long term (current) use of antibiotics: Secondary | ICD-10-CM | POA: Diagnosis not present

## 2014-12-15 DIAGNOSIS — Z8719 Personal history of other diseases of the digestive system: Secondary | ICD-10-CM | POA: Diagnosis not present

## 2014-12-15 DIAGNOSIS — R5381 Other malaise: Secondary | ICD-10-CM | POA: Diagnosis not present

## 2014-12-15 DIAGNOSIS — G8220 Paraplegia, unspecified: Secondary | ICD-10-CM | POA: Diagnosis not present

## 2014-12-15 DIAGNOSIS — Z86718 Personal history of other venous thrombosis and embolism: Secondary | ICD-10-CM | POA: Diagnosis not present

## 2014-12-15 DIAGNOSIS — K219 Gastro-esophageal reflux disease without esophagitis: Secondary | ICD-10-CM | POA: Insufficient documentation

## 2014-12-15 DIAGNOSIS — Z8669 Personal history of other diseases of the nervous system and sense organs: Secondary | ICD-10-CM | POA: Diagnosis not present

## 2014-12-15 DIAGNOSIS — R55 Syncope and collapse: Secondary | ICD-10-CM | POA: Insufficient documentation

## 2014-12-15 DIAGNOSIS — Z87891 Personal history of nicotine dependence: Secondary | ICD-10-CM | POA: Diagnosis not present

## 2014-12-15 DIAGNOSIS — F419 Anxiety disorder, unspecified: Secondary | ICD-10-CM | POA: Diagnosis not present

## 2014-12-15 DIAGNOSIS — Z79899 Other long term (current) drug therapy: Secondary | ICD-10-CM | POA: Insufficient documentation

## 2014-12-15 DIAGNOSIS — N50812 Left testicular pain: Secondary | ICD-10-CM

## 2014-12-15 LAB — CBC WITH DIFFERENTIAL/PLATELET
BASOS ABS: 0 10*3/uL (ref 0.0–0.1)
BASOS PCT: 0 % (ref 0–1)
Eosinophils Absolute: 0 10*3/uL (ref 0.0–0.7)
Eosinophils Relative: 1 % (ref 0–5)
HCT: 41.3 % (ref 39.0–52.0)
Hemoglobin: 13.9 g/dL (ref 13.0–17.0)
LYMPHS ABS: 1.1 10*3/uL (ref 0.7–4.0)
LYMPHS PCT: 26 % (ref 12–46)
MCH: 30.2 pg (ref 26.0–34.0)
MCHC: 33.7 g/dL (ref 30.0–36.0)
MCV: 89.6 fL (ref 78.0–100.0)
Monocytes Absolute: 0.3 10*3/uL (ref 0.1–1.0)
Monocytes Relative: 7 % (ref 3–12)
Neutro Abs: 2.8 10*3/uL (ref 1.7–7.7)
Neutrophils Relative %: 66 % (ref 43–77)
Platelets: 201 10*3/uL (ref 150–400)
RBC: 4.61 MIL/uL (ref 4.22–5.81)
RDW: 12.9 % (ref 11.5–15.5)
WBC: 4.2 10*3/uL (ref 4.0–10.5)

## 2014-12-15 LAB — BASIC METABOLIC PANEL
Anion gap: 5 (ref 5–15)
BUN: 8 mg/dL (ref 6–20)
CHLORIDE: 104 mmol/L (ref 101–111)
CO2: 27 mmol/L (ref 22–32)
Calcium: 9.3 mg/dL (ref 8.9–10.3)
Creatinine, Ser: 0.62 mg/dL (ref 0.61–1.24)
GFR calc non Af Amer: >60 mL/min (ref >60)
GLUCOSE: 133 mg/dL — AB (ref 65–99)
POTASSIUM: 3.8 mmol/L (ref 3.5–5.1)
Sodium: 136 mmol/L (ref 135–145)

## 2014-12-15 LAB — CBG MONITORING, ED: GLUCOSE-CAPILLARY: 111 mg/dL — AB (ref 65–99)

## 2014-12-15 LAB — URINALYSIS, ROUTINE W REFLEX MICROSCOPIC
BILIRUBIN URINE: NEGATIVE
GLUCOSE, UA: NEGATIVE mg/dL
HGB URINE DIPSTICK: NEGATIVE
Ketones, ur: NEGATIVE mg/dL
NITRITE: NEGATIVE
PH: 7.5 (ref 5.0–8.0)
Protein, ur: NEGATIVE mg/dL
Specific Gravity, Urine: 1.008 (ref 1.005–1.030)
Urobilinogen, UA: 0.2 mg/dL (ref 0.0–1.0)

## 2014-12-15 LAB — URINE MICROSCOPIC-ADD ON

## 2014-12-15 MED ORDER — ONDANSETRON 8 MG PO TBDP
8.0000 mg | ORAL_TABLET | Freq: Once | ORAL | Status: AC
  Administered 2014-12-15: 8 mg via ORAL
  Filled 2014-12-15: qty 1

## 2014-12-15 NOTE — Discharge Instructions (Signed)
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
## 2014-12-15 NOTE — ED Notes (Signed)
Bed: WA13 Expected date:  Expected time:  Means of arrival:  Comments: EMS 

## 2014-12-15 NOTE — ED Notes (Signed)
MD ok for patient to take home pain medications. Pt acknowledges understanding.
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
## 2014-12-16 ENCOUNTER — Emergency Department (HOSPITAL_COMMUNITY)
Admission: EM | Admit: 2014-12-16 | Discharge: 2014-12-22 | Disposition: A | Discharge status: Home / Self Care | Payer: Medicaid Other | Attending: Emergency Medicine | Admitting: Emergency Medicine

## 2014-12-16 ENCOUNTER — Encounter (HOSPITAL_COMMUNITY): Payer: Self-pay | Admitting: *Deleted

## 2014-12-16 ENCOUNTER — Emergency Department (HOSPITAL_COMMUNITY): Payer: Medicaid Other

## 2014-12-16 ENCOUNTER — Other Ambulatory Visit: Payer: Self-pay

## 2014-12-16 DIAGNOSIS — Z87891 Personal history of nicotine dependence: Secondary | ICD-10-CM | POA: Insufficient documentation

## 2014-12-16 DIAGNOSIS — Z8611 Personal history of tuberculosis: Secondary | ICD-10-CM | POA: Insufficient documentation

## 2014-12-16 DIAGNOSIS — F329 Major depressive disorder, single episode, unspecified: Secondary | ICD-10-CM | POA: Diagnosis not present

## 2014-12-16 DIAGNOSIS — Z79899 Other long term (current) drug therapy: Secondary | ICD-10-CM | POA: Insufficient documentation

## 2014-12-16 DIAGNOSIS — R61 Generalized hyperhidrosis: Secondary | ICD-10-CM | POA: Insufficient documentation

## 2014-12-16 DIAGNOSIS — F4310 Post-traumatic stress disorder, unspecified: Secondary | ICD-10-CM | POA: Diagnosis not present

## 2014-12-16 DIAGNOSIS — R05 Cough: Secondary | ICD-10-CM | POA: Diagnosis not present

## 2014-12-16 DIAGNOSIS — Z8669 Personal history of other diseases of the nervous system and sense organs: Secondary | ICD-10-CM | POA: Insufficient documentation

## 2014-12-16 DIAGNOSIS — F419 Anxiety disorder, unspecified: Secondary | ICD-10-CM | POA: Insufficient documentation

## 2014-12-16 DIAGNOSIS — F22 Delusional disorders: Secondary | ICD-10-CM | POA: Diagnosis not present

## 2014-12-16 DIAGNOSIS — Z862 Personal history of diseases of the blood and blood-forming organs and certain disorders involving the immune mechanism: Secondary | ICD-10-CM | POA: Diagnosis not present

## 2014-12-16 DIAGNOSIS — Z87828 Personal history of other (healed) physical injury and trauma: Secondary | ICD-10-CM | POA: Insufficient documentation

## 2014-12-16 DIAGNOSIS — R0602 Shortness of breath: Secondary | ICD-10-CM | POA: Diagnosis present

## 2014-12-16 DIAGNOSIS — R079 Chest pain, unspecified: Secondary | ICD-10-CM | POA: Diagnosis not present

## 2014-12-16 DIAGNOSIS — Z86718 Personal history of other venous thrombosis and embolism: Secondary | ICD-10-CM | POA: Diagnosis not present

## 2014-12-16 DIAGNOSIS — K219 Gastro-esophageal reflux disease without esophagitis: Secondary | ICD-10-CM | POA: Diagnosis not present

## 2014-12-16 DIAGNOSIS — Z792 Long term (current) use of antibiotics: Secondary | ICD-10-CM | POA: Insufficient documentation

## 2014-12-16 DIAGNOSIS — Z87440 Personal history of urinary (tract) infections: Secondary | ICD-10-CM | POA: Insufficient documentation

## 2014-12-16 DIAGNOSIS — Z872 Personal history of diseases of the skin and subcutaneous tissue: Secondary | ICD-10-CM | POA: Insufficient documentation

## 2014-12-16 DIAGNOSIS — R6883 Chills (without fever): Secondary | ICD-10-CM | POA: Insufficient documentation

## 2014-12-16 DIAGNOSIS — G8929 Other chronic pain: Secondary | ICD-10-CM

## 2014-12-16 LAB — RAPID URINE DRUG SCREEN, HOSP PERFORMED
AMPHETAMINES: NOT DETECTED
BARBITURATES: NOT DETECTED
Benzodiazepines: NOT DETECTED
Cocaine: NOT DETECTED
Opiates: NOT DETECTED
Tetrahydrocannabinol: NOT DETECTED

## 2014-12-16 LAB — CBC WITH DIFFERENTIAL/PLATELET
Basophils Absolute: 0 10*3/uL (ref 0.0–0.1)
Basophils Relative: 0 % (ref 0–1)
EOS ABS: 0 10*3/uL (ref 0.0–0.7)
EOS PCT: 0 % (ref 0–5)
HCT: 40 % (ref 39.0–52.0)
HEMOGLOBIN: 13.9 g/dL (ref 13.0–17.0)
LYMPHS PCT: 23 % (ref 12–46)
Lymphs Abs: 1.4 10*3/uL (ref 0.7–4.0)
MCH: 31.2 pg (ref 26.0–34.0)
MCHC: 34.8 g/dL (ref 30.0–36.0)
MCV: 89.7 fL (ref 78.0–100.0)
MONOS PCT: 7 % (ref 3–12)
Monocytes Absolute: 0.4 10*3/uL (ref 0.1–1.0)
Neutro Abs: 4.2 10*3/uL (ref 1.7–7.7)
Neutrophils Relative %: 70 % (ref 43–77)
Platelets: 209 10*3/uL (ref 150–400)
RBC: 4.46 MIL/uL (ref 4.22–5.81)
RDW: 12.8 % (ref 11.5–15.5)
WBC: 6.1 10*3/uL (ref 4.0–10.5)

## 2014-12-16 LAB — COMPREHENSIVE METABOLIC PANEL
ALK PHOS: 70 U/L (ref 38–126)
ALT: 13 U/L — ABNORMAL LOW (ref 17–63)
AST: 20 U/L (ref 15–41)
Albumin: 4.4 g/dL (ref 3.5–5.0)
Anion gap: 10 (ref 5–15)
BUN: 12 mg/dL (ref 6–20)
CO2: 24 mmol/L (ref 22–32)
Calcium: 9.7 mg/dL (ref 8.9–10.3)
Chloride: 102 mmol/L (ref 101–111)
Creatinine, Ser: 0.56 mg/dL — ABNORMAL LOW (ref 0.61–1.24)
GFR calc Af Amer: >60 mL/min (ref >60)
GFR calc non Af Amer: >60 mL/min (ref >60)
Glucose, Bld: 115 mg/dL — ABNORMAL HIGH (ref 65–99)
Potassium: 3.7 mmol/L (ref 3.5–5.1)
SODIUM: 136 mmol/L (ref 135–145)
Total Bilirubin: 0.7 mg/dL (ref 0.3–1.2)
Total Protein: 7.9 g/dL (ref 6.5–8.1)

## 2014-12-16 LAB — URINE CULTURE
COLONY COUNT: NO GROWTH
CULTURE: NO GROWTH

## 2014-12-16 LAB — ETHANOL: Alcohol, Ethyl (B): <5 mg/dL (ref <5)

## 2014-12-16 LAB — D-DIMER, QUANTITATIVE (NOT AT ARMC): D DIMER QUANT: 0.4 ug{FEU}/mL (ref 0.00–0.48)

## 2014-12-16 LAB — TROPONIN I

## 2014-12-16 MED ORDER — OXYCODONE HCL 5 MG PO TABS
7.5000 mg | ORAL_TABLET | ORAL | Status: DC | PRN
Start: 2014-12-16 — End: 2014-12-21
  Administered 2014-12-17 – 2014-12-20 (×7): 7.5 mg via ORAL
  Filled 2014-12-16 (×9): qty 2

## 2014-12-16 MED ORDER — ENSURE ENLIVE PO LIQD
237.0000 mL | Freq: Two times a day (BID) | ORAL | Status: DC
  Administered 2014-12-17 – 2014-12-22 (×8): 237 mL via ORAL
  Filled 2014-12-16 (×13): qty 237

## 2014-12-16 MED ORDER — DOCUSATE SODIUM 100 MG PO CAPS
100.0000 mg | ORAL_CAPSULE | Freq: Every day | ORAL | Status: DC | PRN
  Administered 2014-12-19 (×2): 100 mg via ORAL
  Filled 2014-12-16 (×2): qty 1

## 2014-12-16 MED ORDER — GABAPENTIN 100 MG PO CAPS
100.0000 mg | ORAL_CAPSULE | Freq: Four times a day (QID) | ORAL | Status: DC
  Administered 2014-12-17 – 2014-12-20 (×7): 100 mg via ORAL
  Filled 2014-12-16 (×10): qty 1

## 2014-12-16 MED ORDER — AMMONIUM LACTATE 12 % EX LOTN
1.0000 "application " | TOPICAL_LOTION | CUTANEOUS | Status: DC | PRN
  Filled 2014-12-16: qty 400

## 2014-12-16 MED ORDER — TRIAMCINOLONE 0.1 % CREAM:EUCERIN CREAM 1:1
1.0000 "application " | TOPICAL_CREAM | Freq: Three times a day (TID) | CUTANEOUS | Status: DC | PRN
  Filled 2014-12-16: qty 1

## 2014-12-16 MED ORDER — BACLOFEN 10 MG PO TABS
10.0000 mg | ORAL_TABLET | Freq: Three times a day (TID) | ORAL | Status: DC
  Administered 2014-12-17 – 2014-12-20 (×8): 10 mg via ORAL
  Filled 2014-12-16 (×18): qty 1

## 2014-12-16 MED ORDER — CLONAZEPAM 0.5 MG PO TABS
0.2500 mg | ORAL_TABLET | Freq: Every day | ORAL | Status: DC | PRN
  Administered 2014-12-17 – 2014-12-20 (×6): 0.25 mg via ORAL
  Filled 2014-12-16 (×8): qty 1
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
## 2014-12-16 NOTE — ED Notes (Signed)
TTS at bedside. 
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
## 2014-12-16 NOTE — ED Notes (Signed)
Bed: DP94 Expected date:  Expected time:  Means of arrival:  Comments: Ems / sob
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
## 2014-12-16 NOTE — BH Assessment (Signed)
[place], [facility], states Pt is not appropriate for [name]. Contacted the following facilities for placement:  BED AVAILABLE, FAXED CLINICAL INFORMATION: [employer] , per [facility], per [facility], per [facility], per [facility], per [facility], per [employer]  AT CAPACITY: [facility], per [facility], per Wandra Feinstein, per [employer], per [facility], per [facility], per [facility], per [facility], per [employer], per [employer], per [facility], per [facility], per [facility], per [facility], per [facility], per [facility], per [employer], [facility], [facility], [facility] Triage Specialist [phone]

## 2014-12-17 DIAGNOSIS — F22 Delusional disorders: Secondary | ICD-10-CM | POA: Diagnosis not present

## 2014-12-17 MED ORDER — RISPERIDONE 0.5 MG PO TBDP
0.5000 mg | ORAL_TABLET | Freq: Two times a day (BID) | ORAL | Status: DC
  Administered 2014-12-18 – 2014-12-20 (×6): 0.5 mg via ORAL
  Filled 2014-12-17 (×11): qty 1

## 2014-12-17 MED ORDER — BENZTROPINE MESYLATE 1 MG PO TABS
0.5000 mg | ORAL_TABLET | Freq: Two times a day (BID) | ORAL | Status: DC
  Administered 2014-12-18 – 2014-12-20 (×6): 0.5 mg via ORAL
  Filled 2014-12-17 (×9): qty 1

## 2014-12-17 NOTE — ED Notes (Signed)
Pt ate 75% of his food

## 2014-12-17 NOTE — ED Notes (Addendum)
Pt.'s home medications : clonazepam, Oxycodone , baclofen and gabapentin stored by Pharmacy, slip on pt.'s chart. Pt.'s car key and [email or web address]  Kept in locker #32 . $120.00 cash  Kept in Security's lock box.
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
## 2014-12-17 NOTE — ED Notes (Signed)
Spoke with provider about patient being anxious of having a urinary tract infection due to increased weakness and he reports he needs to catheterize himself more frequently. Urineanalysis has been ordered. Informed patient the next time he catherizes himself, to obtain a specimen for send to the lab for urinalysis.

## 2014-12-17 NOTE — ED Notes (Signed)
Pt stated he feels weaker than normal and is anxious about his breathing. Vital signs were obtained and with in normal limits. Offered for patient to take scheduled medications and PRN colace for his constipation and PRN Klonopin for his anxiousness. Pt refused. Pt appears in no distress. Respirations are even, regular, and unlabored. Skin is warm and dry.

## 2014-12-17 NOTE — ED Notes (Signed)
Pt resting quietly with eyes closed. When entering the room, asking if he will take his 22:00 scheduled medication, he states "not right now". Will try later to see if patient will take his medication.

## 2014-12-17 NOTE — ED Notes (Signed)
Bed: YV57 Expected date:  Expected time:  Means of arrival:  Comments: RM 5

## 2014-12-18 DIAGNOSIS — F22 Delusional disorders: Secondary | ICD-10-CM

## 2014-12-18 DIAGNOSIS — F4310 Post-traumatic stress disorder, unspecified: Secondary | ICD-10-CM

## 2014-12-18 LAB — URINALYSIS, ROUTINE W REFLEX MICROSCOPIC
BILIRUBIN URINE: NEGATIVE
Glucose, UA: NEGATIVE mg/dL
Hgb urine dipstick: NEGATIVE
Ketones, ur: NEGATIVE mg/dL
NITRITE: NEGATIVE
PH: 6.5 (ref 5.0–8.0)
Protein, ur: NEGATIVE mg/dL
Specific Gravity, Urine: 1.013 (ref 1.005–1.030)
Urobilinogen, UA: 0.2 mg/dL (ref 0.0–1.0)

## 2014-12-18 LAB — URINE MICROSCOPIC-ADD ON

## 2014-12-18 MED ORDER — LORAZEPAM 2 MG/ML IJ SOLN
1.0000 mg | Freq: Four times a day (QID) | INTRAMUSCULAR | Status: DC | PRN
  Filled 2014-12-18: qty 1

## 2014-12-18 MED ORDER — SERTRALINE HCL 50 MG PO TABS
25.0000 mg | ORAL_TABLET | Freq: Every day | ORAL | Status: DC
  Administered 2014-12-19: 25 mg via ORAL
  Filled 2014-12-18: qty 0.5
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
## 2014-12-18 NOTE — ED Notes (Signed)
Mom at bedside visiting.

## 2014-12-18 NOTE — ED Notes (Signed)
Pt request catheter kit for self catheterization.
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
## 2014-12-18 NOTE — BH Assessment (Signed)
[facility] Assessment Progress Note  The following facilities have been contacted to seek placement for this pt, with results as noted:  Beds available, information sent, decision pending:  [facility]:  [facility]:  [facility] (no high acuity beds available on 6/13)   Jalene Mullet, [facility] Triage Specialist

## 2014-12-19 DIAGNOSIS — F22 Delusional disorders: Secondary | ICD-10-CM | POA: Diagnosis not present

## 2014-12-19 MED ORDER — TRAZODONE HCL 50 MG PO TABS
50.0000 mg | ORAL_TABLET | Freq: Every day | ORAL | Status: DC
Start: 2014-12-19 — End: 2014-12-20
  Filled 2014-12-19: qty 1

## 2014-12-19 MED ORDER — SERTRALINE HCL 50 MG PO TABS
50.0000 mg | ORAL_TABLET | Freq: Every day | ORAL | Status: DC
  Administered 2014-12-20: 50 mg via ORAL
  Filled 2014-12-19: qty 1

## 2014-12-19 NOTE — BH Assessment (Addendum)
[facility] Assessment Progress Note  The following facilities have been contacted to seek placement for this pt, with results as noted:  Beds available, information sent, decision pending:  [facility]  At capacity:  [facility]:  [facility] (clinical acuity)  Jalene Mullet, [facility] Triage Specialist [phone]

## 2014-12-19 NOTE — ED Notes (Signed)
Pt states he feels like he is going to pass out.  VS are stable. He c/o abdominal discomfort but refuses medication. He states he is constipated and has not had a BM in 9 days. He had prune juice and colace today but stool does not feel like it wants to move.
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
## 2014-12-20 MED ORDER — TRAZODONE HCL 100 MG PO TABS
100.0000 mg | ORAL_TABLET | Freq: Every day | ORAL | Status: DC
  Administered 2014-12-20: 100 mg via ORAL
  Filled 2014-12-20 (×2): qty 1

## 2014-12-20 MED ORDER — SERTRALINE HCL 50 MG PO TABS
75.0000 mg | ORAL_TABLET | Freq: Every day | ORAL | Status: DC
  Filled 2014-12-20 (×2): qty 2
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
## 2014-12-20 NOTE — ED Notes (Signed)
Catheter removed from left wrist. 20g IV cath in pt's arm covered with tegraderm, but no saline lock tubing attatched.

## 2014-12-20 NOTE — ED Notes (Addendum)
Pt stating he is feeling tightness in his chest and is wanting to see a doctor. Talked to [facility] and states she will notify Dr. Randal Buba.

## 2014-12-20 NOTE — ED Notes (Signed)
Talked to Dr. Randal Buba about pt complaints of chest pain. Chart reviewed with Dr. Randal Buba and no further orders received at this time. Explained to the pt that lab work and EKG was done to evaluate the pt's complaint of chest pain. Also explained to the pt that he was seen by cardiology and was cleared. Pt states when he came in he didn't have chest pain but only had lung pain. VSS. Offered pt oxycodone for pain at this time. Pt states he would take the oxycodone for pain.

## 2014-12-20 NOTE — ED Notes (Signed)
Pt wants to know why he doesn't have a sitter, because everyone else has one. Pt asked why he needed a sitter and pt stated that he needed one because everyone else had one. Pt stated that he was having chest pain and VS were checked which were WNL. Pt states his VS were low and that he felt that after he drunk the water he started to feel this way. Pt asked the nurse if something wasin the water that he drunk. Water pitcher was filled up at the sink in the room while the pt watched per the pt's request. Explained to the pt that all the lab work was checked and any acute issues were ruled out. Pt offered oxycodone for the pain and the pt states that the oxycodone is for chronic pain not for chest pain and the pt refused the medication.

## 2014-12-20 NOTE — ED Notes (Signed)
Pt calling out multiple times stating that he is having a medical emergency. Pt stating that he does not believe the nurse called the doctor. Explained to pt that there is one doctor in the emergency room and the doctor has been notified of pt's complaints. Pt asking for BP to checked. BP-136/88 and P-66

## 2014-12-21 LAB — URINALYSIS, ROUTINE W REFLEX MICROSCOPIC
BILIRUBIN URINE: NEGATIVE
Glucose, UA: NEGATIVE mg/dL
Hgb urine dipstick: NEGATIVE
KETONES UR: NEGATIVE mg/dL
Nitrite: NEGATIVE
PH: 6 (ref 5.0–8.0)
Protein, ur: NEGATIVE mg/dL
SPECIFIC GRAVITY, URINE: 1.019 (ref 1.005–1.030)
Urobilinogen, UA: 0.2 mg/dL (ref 0.0–1.0)

## 2014-12-21 LAB — URINE MICROSCOPIC-ADD ON

## 2014-12-21 MED ORDER — GABAPENTIN 100 MG PO CAPS
100.0000 mg | ORAL_CAPSULE | Freq: Two times a day (BID) | ORAL | Status: DC
  Filled 2014-12-21 (×2): qty 1

## 2014-12-21 MED ORDER — BACLOFEN 10 MG PO TABS
10.0000 mg | ORAL_TABLET | Freq: Two times a day (BID) | ORAL | Status: DC
  Filled 2014-12-21 (×4): qty 1

## 2014-12-21 MED ORDER — OXYCODONE HCL 5 MG PO TABS
7.5000 mg | ORAL_TABLET | Freq: Two times a day (BID) | ORAL | Status: DC | PRN
Start: 2014-12-21 — End: 2014-12-22
  Filled 2014-12-21: qty 2

## 2014-12-21 NOTE — ED Notes (Addendum)
Pt has mutliple somatic complaints. He stated he thinks he may die. Pt told the writer he has not been taking any of his meds and does not know why he feels so weak and tired. Pt stated,'my mom and sister told me I had to tell them I was suicidal or that I could not come back and stay with them.I do not want to hurt myself. " Pt had the writer listen to his heart and was reassured that his heart rate was regular . He was reassured that he had a normal heart beat and that his heart beats were in a normal rhythm. Pts urine is draining amber urine and there does appear to be some sediment. MD made aware. 11:45am-Urine macro/micro and CNS sent on pt. Abd soft and nontender with positive BS. 1:25p-Spoke to NP concerning urine results. 1:50p-pt stated he wanted to wait to take a bath or a shower. He did eat lunch and contracts for safety. Pt has been sleeping on and off most of the shift. He does not at this time want to get OOB. Pt is still fixated on somatic compliants. He is now worried he has cancer due to a lump on one of his testicles. Pt told the writer that he did have an ultrasound of this and was told it was normal
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
## 2014-12-21 NOTE — ED Notes (Signed)
Pt awakens from sleep stating "yall are trying to kill me, I woke up and didn't know where I was". "That medicine tasted funny so I didn't take it, what did you give me?".  Pt reoriented to surroundings and situation.Explained to pt that he has been taking the same medication since he has been here and nothing was done to his medication. Cup at the bedside has water in it and some disintegrated pills at the bottom of the cup, that the pt states he did not take once the nurse left the room. Explained to the pt if he does not take the pills given, he is not going to feel any better than when he first arrived. Pt states "something is not right with my head, yall trying to kill me".

## 2014-12-21 NOTE — ED Notes (Addendum)
Pt is awake and alert refusing all medications. Pt reports that staff is trying to kill him and he thinks is going to die.

## 2014-12-22 MED ORDER — SERTRALINE HCL 25 MG PO TABS: 75.0000 mg | ORAL_TABLET | Freq: Every day | ORAL | Status: DC

## 2014-12-22 MED ORDER — ENSURE PO LIQD: 237.0000 mL | Freq: Two times a day (BID) | ORAL | Status: DC

## 2014-12-22 MED ORDER — BACLOFEN 10 MG PO TABS: ORAL_TABLET | ORAL | Status: DC

## 2014-12-22 MED ORDER — BENZTROPINE MESYLATE 0.5 MG PO TABS: 0.5000 mg | ORAL_TABLET | Freq: Two times a day (BID) | ORAL | Status: DC

## 2014-12-22 MED ORDER — TRIAMCINOLONE 0.1 % CREAM:EUCERIN CREAM 1:1: 1.0000 "application " | TOPICAL_CREAM | Freq: Three times a day (TID) | CUTANEOUS | Status: DC | PRN

## 2014-12-22 MED ORDER — TRAZODONE HCL 100 MG PO TABS: 100.0000 mg | ORAL_TABLET | Freq: Every day | ORAL | Status: DC

## 2014-12-22 MED ORDER — RISPERIDONE 0.5 MG PO TBDP: 0.5000 mg | ORAL_TABLET | Freq: Two times a day (BID) | ORAL | Status: DC

## 2014-12-22 MED ORDER — GABAPENTIN 100 MG PO CAPS: 100.0000 mg | ORAL_CAPSULE | Freq: Two times a day (BID) | ORAL | Status: DC

## 2014-12-22 MED ORDER — DOCUSATE SODIUM 100 MG PO CAPS: 100.0000 mg | ORAL_CAPSULE | Freq: Every day | ORAL | Status: DC | PRN

## 2014-12-22 NOTE — ED Notes (Signed)
Called [facility] for transport, state they are 2 hours behind.
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
## 2014-12-22 NOTE — ED Notes (Addendum)
Report from Icram RN/ 11;30am- pt remains in bed and refused to take any of his meds. Pt stated even though  He has been discharged he has no place to go. Spoke with case worker who will speak to the pt. Pt has not gotten out of bed. He has been encouraged. Pt states he thinks that something is in his head from using the earbuds. 1pm-Am care competed -pt tolerated well.presently he is OOB in a cardiac chair. Pt ate 100% of his lunch . Social worker has phoned pts MD  to find out who will care for pt once discharged-Pt appears in better spirits but still has multiple somatic complaints. Social worker is waiting for relatives to phone back to see who will let the pt into his apt. All healthcare services have been in place for the pt. Pt denies Si and HI at this time .3p-Pt sat OOB in a cardiac chair times 2.5 hours. Pt tolerated well. He requested to go back to bed.  3p-Pt stated he was having a hard time breathing. Lung sounds are clear and pt remains with a pulse ox of 100% on room air.

## 2014-12-22 NOTE — Progress Notes (Signed)
CSW informed patient states he doesn't having anywhere to go. Patient states that he thinks the chemicals from his headphones have gotten into his brain. Pt receiving assistance from staff to move from bed to chair. CSW to attempt to speak with patient once transfer completed.   Belia Heman, [place] Work  [employer] [phone]

## 2014-12-22 NOTE — Progress Notes (Signed)
CSW spoke with sister of pt who states that she is at the house. CSW informed sister that the pt is ready for discharge. CSW made pt and sister aware that someone would have to be at the house upon his return.  Sister and pt express understanding. CSW made nurse aware who states that he will call [facility].  Willette Brace [phone] ED CSW  12/22/2014 4:27 PM

## 2014-12-22 NOTE — ED Notes (Addendum)
Pt is awake and alert. Pt reports "people are watching me" "someone is coming in/out of my room taking things" pt is unable to reports racing thoughts and often hears songs playing in his head. Pt states " i feel like am going to die" pt is paranoid with impending doom. provided reassurance and offered other coping skills. Pt report that deep breathing exercise are not helpful. Pt is refusing PRN or schedule medication. Will continue to monitor for safety.

## 2014-12-22 NOTE — Progress Notes (Signed)
CSW met with pt at bedside. Pt states that he is scared to go home. Pt states that he has his own home, his cousin lives with him, and has home nursing care provided by his aunt. Pt states he receives services daily from 10-2 and 6-10 daily. Patient cousin also assist with care from cousin before and after work. Pt states that he's afriad he's going to die. CSW asked patietn why he thinks hes going to die. Pt states, "I dont know, my pulse just feels weak, like i'm just going to fall out." CSW discussed with RN and tech who states that patient vitals have been stable. CSW also discussed with NP who states that patietn is medically and psychiatrically stable for discharge home. CSW and pt called pt mother and sister however no answer. Pt states he doesn't have a key into the house. Pt states he will continue to call his family regarding his discharge home.   Belia Heman, [place] Work  [employer] [phone]

## 2014-12-25 ENCOUNTER — Emergency Department (HOSPITAL_COMMUNITY)
Admission: EM | Admit: 2014-12-25 | Discharge: 2014-12-25 | Disposition: A | Discharge status: Home / Self Care | Payer: Medicaid Other | Attending: Emergency Medicine | Admitting: Emergency Medicine

## 2014-12-25 ENCOUNTER — Encounter: Payer: Medicaid Other | Admitting: Internal Medicine

## 2014-12-25 ENCOUNTER — Encounter (HOSPITAL_COMMUNITY): Payer: Self-pay | Admitting: Family Medicine

## 2014-12-25 DIAGNOSIS — F419 Anxiety disorder, unspecified: Secondary | ICD-10-CM

## 2014-12-25 DIAGNOSIS — R5383 Other fatigue: Secondary | ICD-10-CM | POA: Diagnosis present

## 2014-12-25 DIAGNOSIS — Z87440 Personal history of urinary (tract) infections: Secondary | ICD-10-CM | POA: Diagnosis not present

## 2014-12-25 DIAGNOSIS — Z86718 Personal history of other venous thrombosis and embolism: Secondary | ICD-10-CM | POA: Insufficient documentation

## 2014-12-25 DIAGNOSIS — K5900 Constipation, unspecified: Secondary | ICD-10-CM | POA: Insufficient documentation

## 2014-12-25 DIAGNOSIS — Z8669 Personal history of other diseases of the nervous system and sense organs: Secondary | ICD-10-CM | POA: Insufficient documentation

## 2014-12-25 DIAGNOSIS — F32A Depression, unspecified: Secondary | ICD-10-CM

## 2014-12-25 DIAGNOSIS — M629 Disorder of muscle, unspecified: Secondary | ICD-10-CM | POA: Diagnosis not present

## 2014-12-25 DIAGNOSIS — Z872 Personal history of diseases of the skin and subcutaneous tissue: Secondary | ICD-10-CM | POA: Diagnosis not present

## 2014-12-25 DIAGNOSIS — Z862 Personal history of diseases of the blood and blood-forming organs and certain disorders involving the immune mechanism: Secondary | ICD-10-CM | POA: Insufficient documentation

## 2014-12-25 DIAGNOSIS — F418 Other specified anxiety disorders: Secondary | ICD-10-CM | POA: Diagnosis not present

## 2014-12-25 DIAGNOSIS — Z79899 Other long term (current) drug therapy: Secondary | ICD-10-CM | POA: Diagnosis not present

## 2014-12-25 DIAGNOSIS — Z87891 Personal history of nicotine dependence: Secondary | ICD-10-CM | POA: Diagnosis not present

## 2014-12-25 DIAGNOSIS — F329 Major depressive disorder, single episode, unspecified: Secondary | ICD-10-CM

## 2014-12-25 LAB — URINALYSIS, ROUTINE W REFLEX MICROSCOPIC
Bilirubin Urine: NEGATIVE
GLUCOSE, UA: NEGATIVE mg/dL
Hgb urine dipstick: NEGATIVE
Ketones, ur: 15 mg/dL — AB
Nitrite: NEGATIVE
PH: 6 (ref 5.0–8.0)
Protein, ur: NEGATIVE mg/dL
SPECIFIC GRAVITY, URINE: 1.027 (ref 1.005–1.030)
Urobilinogen, UA: 0.2 mg/dL (ref 0.0–1.0)

## 2014-12-25 LAB — COMPREHENSIVE METABOLIC PANEL
ALBUMIN: 4 g/dL (ref 3.5–5.0)
ALT: 16 U/L — AB (ref 17–63)
ANION GAP: 8 (ref 5–15)
AST: 19 U/L (ref 15–41)
Alkaline Phosphatase: 65 U/L (ref 38–126)
BUN: 11 mg/dL (ref 6–20)
CHLORIDE: 102 mmol/L (ref 101–111)
CO2: 27 mmol/L (ref 22–32)
CREATININE: 0.75 mg/dL (ref 0.61–1.24)
Calcium: 9.9 mg/dL (ref 8.9–10.3)
GFR calc non Af Amer: >60 mL/min (ref >60)
Glucose, Bld: 107 mg/dL — ABNORMAL HIGH (ref 65–99)
POTASSIUM: 3.9 mmol/L (ref 3.5–5.1)
Sodium: 137 mmol/L (ref 135–145)
TOTAL PROTEIN: 7.9 g/dL (ref 6.5–8.1)
Total Bilirubin: 1.2 mg/dL (ref 0.3–1.2)

## 2014-12-25 LAB — URINE MICROSCOPIC-ADD ON

## 2014-12-25 LAB — URINE CULTURE

## 2014-12-25 LAB — CBC WITH DIFFERENTIAL/PLATELET
BASOS ABS: 0 10*3/uL (ref 0.0–0.1)
Basophils Relative: 1 % (ref 0–1)
Eosinophils Absolute: 0.1 10*3/uL (ref 0.0–0.7)
Eosinophils Relative: 1 % (ref 0–5)
HEMATOCRIT: 43.4 % (ref 39.0–52.0)
HEMOGLOBIN: 15 g/dL (ref 13.0–17.0)
Lymphocytes Relative: 31 % (ref 12–46)
Lymphs Abs: 1.7 10*3/uL (ref 0.7–4.0)
MCH: 30.5 pg (ref 26.0–34.0)
MCHC: 34.6 g/dL (ref 30.0–36.0)
MCV: 88.4 fL (ref 78.0–100.0)
Monocytes Absolute: 0.5 10*3/uL (ref 0.1–1.0)
Monocytes Relative: 10 % (ref 3–12)
Neutro Abs: 3.1 10*3/uL (ref 1.7–7.7)
Neutrophils Relative %: 57 % (ref 43–77)
Platelets: 273 10*3/uL (ref 150–400)
RBC: 4.91 MIL/uL (ref 4.22–5.81)
RDW: 12.9 % (ref 11.5–15.5)
WBC: 5.4 10*3/uL (ref 4.0–10.5)

## 2014-12-25 LAB — LIPASE, BLOOD: LIPASE: 36 U/L (ref 22–51)

## 2014-12-25 LAB — I-STAT CG4 LACTIC ACID, ED: LACTIC ACID, VENOUS: 0.84 mmol/L (ref 0.5–2.0)

## 2014-12-25 MED ORDER — SODIUM CHLORIDE 0.9 % IV BOLUS (SEPSIS)
1000.0000 mL | Freq: Once | INTRAVENOUS | Status: AC
  Administered 2014-12-25: 1000 mL via INTRAVENOUS

## 2014-12-25 NOTE — Discharge Instructions (Signed)
1. Medications: usual home medications - including Zoloft, Klonopin, Colace, sorbitol, Risperdal, Cogentin, trazodone 2. Treatment: rest, drink plenty of fluids,  3. Follow Up: Please followup with your primary doctor in 2 days for discussion of your diagnoses and further evaluation after today's visit; please also follow-up at your appointment on Thursday with your counselor for further evaluation and medication management; please return to the emergency department for measured fevers, vomiting or other concerning symptoms   Constipation Constipation is when a person has fewer than three bowel movements a week, has difficulty having a bowel movement, or has stools that are dry, hard, or larger than normal. As people grow older, constipation is more common. If you try to fix constipation with medicines that make you have a bowel movement (laxatives), the problem may get worse. Long-term laxative use may cause the muscles of the colon to become weak. A low-fiber diet, not taking in enough fluids, and taking certain medicines may make constipation worse.  CAUSES   Certain medicines, such as antidepressants, pain medicine, iron supplements, antacids, and water pills.   Certain diseases, such as diabetes, irritable bowel syndrome (IBS), thyroid disease, or depression.   Not drinking enough water.   Not eating enough fiber-rich foods.   Stress or travel.   Lack of physical activity or exercise.   Ignoring the urge to have a bowel movement.   Using laxatives too much.  SIGNS AND SYMPTOMS   Having fewer than three bowel movements a week.   Straining to have a bowel movement.   Having stools that are hard, dry, or larger than normal.   Feeling full or bloated.   Pain in the lower abdomen.   Not feeling relief after having a bowel movement.  DIAGNOSIS  Your health care provider will take a medical history and perform a physical exam. Further testing may be done for severe  constipation. Some tests may include:  A barium enema X-ray to examine your rectum, colon, and, sometimes, your small intestine.   A sigmoidoscopy to examine your lower colon.   A colonoscopy to examine your entire colon. TREATMENT  Treatment will depend on the severity of your constipation and what is causing it. Some dietary treatments include drinking more fluids and eating more fiber-rich foods. Lifestyle treatments may include regular exercise. If these diet and lifestyle recommendations do not help, your health care provider may recommend taking over-the-counter laxative medicines to help you have bowel movements. Prescription medicines may be prescribed if over-the-counter medicines do not work.  HOME CARE INSTRUCTIONS   Eat foods that have a lot of fiber, such as fruits, vegetables, whole grains, and beans.  Limit foods high in fat and processed sugars, such as french fries, hamburgers, cookies, candies, and soda.   A fiber supplement may be added to your diet if you cannot get enough fiber from foods.   Drink enough fluids to keep your urine clear or pale yellow.   Exercise regularly or as directed by your health care provider.   Go to the restroom when you have the urge to go. Do not hold it.   Only take over-the-counter or prescription medicines as directed by your health care provider. Do not take other medicines for constipation without talking to your health care provider first.  [place] IF:   You have bright red blood in your stool.   Your constipation lasts for more than 4 days or gets worse.   You have abdominal or rectal pain.  You have thin, pencil-like stools.   You have unexplained weight loss. MAKE SURE YOU:   Understand these instructions.  Will watch your condition.  Will get help right away if you are not doing well or get worse. Document Released: 03/21/2004 Document Revised: 06/28/2013 Document Reviewed:  04/04/2013 [facility] Patient Information 2015 [place], [place]. This information is not intended to replace advice given to you by your health care provider. Make sure you discuss any questions you have with your health care provider.
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
## 2014-12-25 NOTE — ED Notes (Addendum)
Pt here for weakness over the last 2 weeks. sts cloudy urine with fishy smell. sts seen recently for the same and not better.sts abd pain
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
## 2015-01-01 ENCOUNTER — Telehealth: Payer: Self-pay | Admitting: Internal Medicine

## 2015-01-01 ENCOUNTER — Telehealth: Payer: Self-pay | Admitting: *Deleted

## 2015-01-01 NOTE — Telephone Encounter (Signed)
HHN informed and she will call pt. And let him know.

## 2015-01-01 NOTE — Telephone Encounter (Signed)
Call from [place], [place] with [facility] - # [phone]  Nurse went out to see pt for visit ( every 60 days)  She states pt has been in ED 5 times in past 2 months and reports being  Scared to be by self, night sweats, SOB, palpations, and having lower Abd pain.   Nurse has seen pt for years and has never seen him like this.  She states he "looks bad" She did draw some blood work and a urine sample if you want any test done. His vitals are good, lung souls diminished but this is normal for pt. He tells her that he feels like he will check out. Nurse is concerned as this is new for pt.  Do you want any labs done?   Reading past notes none of the above is new.  Onset  About 3 weeks ago.  Pt seen by Phychiatry and symptoms the same.

## 2015-01-01 NOTE — Telephone Encounter (Signed)
Nurse calling with concerns about patient.

## 2015-01-01 NOTE — Telephone Encounter (Signed)
Recommend [facility] ED for [facility] eval. Appears was there from 6/11 - 6/17 and started on Risperdal Cogentin, and Zoloft. Was appropriate for inpt pysch admit but apparently stabilized and D/C'd on the 17th to home. Note from 6/20 He was discharged home on 6/17 after improvement of his paranoia with Risperdal, Cogentin, Zoloft and trazodone. He reports he has not been taking any of these medications.  Sxs could have worsened off meds. To [facility] ED.[name]

## 2015-01-04 NOTE — Telephone Encounter (Signed)
Thank you for your input on this Dr Lynnae January.

## 2015-01-10 ENCOUNTER — Other Ambulatory Visit: Payer: Self-pay | Admitting: Internal Medicine

## 2015-01-10 DIAGNOSIS — R918 Other nonspecific abnormal finding of lung field: Secondary | ICD-10-CM

## 2015-01-10 NOTE — Progress Notes (Signed)
The patient's home health aide called me and talked to me about how she felt the patient has been feeling short of breath, and has lost weight. Clinic records do not show an appreciable loss of weight, however it is hard to weight the patient as he is paraplegic. My last visit with him was in November 2015. I reviewed a CT done in 11/15 which reported lung nodules and would follow up on that.

## 2015-01-11 ENCOUNTER — Telehealth: Payer: Self-pay | Admitting: *Deleted

## 2015-01-11 NOTE — Telephone Encounter (Signed)
Please either bring the patient to clinic or go to the ER, it is very difficult to advise over the phone. If the patient is dizzy, please go to the ER urgently.

## 2015-01-11 NOTE — Telephone Encounter (Signed)
HHN calls and states she is growing more and more concerned about pt's symptoms of nervousness, fear of being alone, increased HR, sweating, is having swallowing problems now, becomes easily choked, weakness- he is having to be raised up by others now and becomes quite dizzy when he is raised up. He tells the [facility] that he knows he is going to die, that there is something going on in his body that is causing this and does not want to die alone. Family is becoming very tired and worried.  Please advise, HHN has been with pt for 6 years and feels something very possibly could be going on, he has been as independent as possible until recently and was socializing very well.

## 2015-01-12 ENCOUNTER — Ambulatory Visit: Payer: Self-pay | Admitting: Internal Medicine

## 2015-01-12 ENCOUNTER — Emergency Department (HOSPITAL_COMMUNITY): Payer: Medicaid Other

## 2015-01-12 ENCOUNTER — Encounter (HOSPITAL_COMMUNITY): Payer: Self-pay | Admitting: Emergency Medicine

## 2015-01-12 ENCOUNTER — Emergency Department (HOSPITAL_COMMUNITY)
Admission: EM | Admit: 2015-01-12 | Discharge: 2015-01-16 | Disposition: A | Discharge status: Home / Self Care | Payer: Medicaid Other | Attending: Emergency Medicine | Admitting: Emergency Medicine

## 2015-01-12 DIAGNOSIS — Z872 Personal history of diseases of the skin and subcutaneous tissue: Secondary | ICD-10-CM | POA: Insufficient documentation

## 2015-01-12 DIAGNOSIS — G479 Sleep disorder, unspecified: Secondary | ICD-10-CM | POA: Insufficient documentation

## 2015-01-12 DIAGNOSIS — G839 Paralytic syndrome, unspecified: Secondary | ICD-10-CM | POA: Diagnosis not present

## 2015-01-12 DIAGNOSIS — Z9114 Patient's other noncompliance with medication regimen: Secondary | ICD-10-CM | POA: Diagnosis not present

## 2015-01-12 DIAGNOSIS — Z8619 Personal history of other infectious and parasitic diseases: Secondary | ICD-10-CM | POA: Diagnosis not present

## 2015-01-12 DIAGNOSIS — F323 Major depressive disorder, single episode, severe with psychotic features: Secondary | ICD-10-CM | POA: Diagnosis present

## 2015-01-12 DIAGNOSIS — Z86718 Personal history of other venous thrombosis and embolism: Secondary | ICD-10-CM | POA: Insufficient documentation

## 2015-01-12 DIAGNOSIS — F419 Anxiety disorder, unspecified: Secondary | ICD-10-CM | POA: Diagnosis not present

## 2015-01-12 DIAGNOSIS — Z862 Personal history of diseases of the blood and blood-forming organs and certain disorders involving the immune mechanism: Secondary | ICD-10-CM | POA: Diagnosis not present

## 2015-01-12 DIAGNOSIS — M545 Low back pain: Secondary | ICD-10-CM | POA: Insufficient documentation

## 2015-01-12 DIAGNOSIS — Z79899 Other long term (current) drug therapy: Secondary | ICD-10-CM | POA: Diagnosis not present

## 2015-01-12 DIAGNOSIS — R630 Anorexia: Secondary | ICD-10-CM | POA: Insufficient documentation

## 2015-01-12 DIAGNOSIS — F32A Depression, unspecified: Secondary | ICD-10-CM

## 2015-01-12 DIAGNOSIS — R002 Palpitations: Secondary | ICD-10-CM | POA: Insufficient documentation

## 2015-01-12 DIAGNOSIS — R0602 Shortness of breath: Secondary | ICD-10-CM | POA: Diagnosis present

## 2015-01-12 DIAGNOSIS — F329 Major depressive disorder, single episode, unspecified: Secondary | ICD-10-CM | POA: Diagnosis not present

## 2015-01-12 DIAGNOSIS — Z8719 Personal history of other diseases of the digestive system: Secondary | ICD-10-CM | POA: Insufficient documentation

## 2015-01-12 DIAGNOSIS — Z87440 Personal history of urinary (tract) infections: Secondary | ICD-10-CM | POA: Insufficient documentation

## 2015-01-12 DIAGNOSIS — Z87891 Personal history of nicotine dependence: Secondary | ICD-10-CM | POA: Insufficient documentation

## 2015-01-12 LAB — COMPREHENSIVE METABOLIC PANEL
ALK PHOS: 59 U/L (ref 38–126)
ALT: 14 U/L — AB (ref 17–63)
AST: 19 U/L (ref 15–41)
Albumin: 3.8 g/dL (ref 3.5–5.0)
Anion gap: 9 (ref 5–15)
BUN: <5 mg/dL — ABNORMAL LOW (ref 6–20)
CALCIUM: 9.6 mg/dL (ref 8.9–10.3)
CHLORIDE: 100 mmol/L — AB (ref 101–111)
CO2: 24 mmol/L (ref 22–32)
Creatinine, Ser: 0.59 mg/dL — ABNORMAL LOW (ref 0.61–1.24)
GFR calc Af Amer: >60 mL/min (ref >60)
GLUCOSE: 90 mg/dL (ref 65–99)
POTASSIUM: 3.9 mmol/L (ref 3.5–5.1)
SODIUM: 133 mmol/L — AB (ref 135–145)
Total Bilirubin: 0.7 mg/dL (ref 0.3–1.2)
Total Protein: 7.3 g/dL (ref 6.5–8.1)

## 2015-01-12 LAB — CBC WITH DIFFERENTIAL/PLATELET
BASOS PCT: 0 % (ref 0–1)
Basophils Absolute: 0 10*3/uL (ref 0.0–0.1)
Eosinophils Absolute: 0 10*3/uL (ref 0.0–0.7)
Eosinophils Relative: 1 % (ref 0–5)
HCT: 40.6 % (ref 39.0–52.0)
HEMOGLOBIN: 13.7 g/dL (ref 13.0–17.0)
LYMPHS PCT: 35 % (ref 12–46)
Lymphs Abs: 1.4 10*3/uL (ref 0.7–4.0)
MCH: 30.4 pg (ref 26.0–34.0)
MCHC: 33.7 g/dL (ref 30.0–36.0)
MCV: 90 fL (ref 78.0–100.0)
MONO ABS: 0.2 10*3/uL (ref 0.1–1.0)
MONOS PCT: 5 % (ref 3–12)
NEUTROS ABS: 2.4 10*3/uL (ref 1.7–7.7)
NEUTROS PCT: 59 % (ref 43–77)
Platelets: 221 10*3/uL (ref 150–400)
RBC: 4.51 MIL/uL (ref 4.22–5.81)
RDW: 13.2 % (ref 11.5–15.5)
WBC: 4.1 10*3/uL (ref 4.0–10.5)

## 2015-01-12 LAB — RAPID URINE DRUG SCREEN, HOSP PERFORMED
Amphetamines: NOT DETECTED
BENZODIAZEPINES: NOT DETECTED
Barbiturates: NOT DETECTED
COCAINE: NOT DETECTED
Opiates: NOT DETECTED
TETRAHYDROCANNABINOL: NOT DETECTED

## 2015-01-12 LAB — URINALYSIS, ROUTINE W REFLEX MICROSCOPIC
Bilirubin Urine: NEGATIVE
GLUCOSE, UA: NEGATIVE mg/dL
HGB URINE DIPSTICK: NEGATIVE
Ketones, ur: NEGATIVE mg/dL
Nitrite: NEGATIVE
PROTEIN: NEGATIVE mg/dL
Specific Gravity, Urine: 1.009 (ref 1.005–1.030)
Urobilinogen, UA: 0.2 mg/dL (ref 0.0–1.0)
pH: 7 (ref 5.0–8.0)

## 2015-01-12 LAB — ETHANOL

## 2015-01-12 LAB — I-STAT TROPONIN, ED: Troponin i, poc: 0.01 ng/mL (ref 0.00–0.08)

## 2015-01-12 LAB — URINE MICROSCOPIC-ADD ON

## 2015-01-12 LAB — TSH: TSH: 0.875 u[IU]/mL (ref 0.350–4.500)

## 2015-01-12 MED ORDER — LORAZEPAM 1 MG PO TABS
1.0000 mg | ORAL_TABLET | Freq: Once | ORAL | Status: AC
  Administered 2015-01-12: 1 mg via ORAL
  Filled 2015-01-12: qty 1

## 2015-01-12 MED ORDER — ACETAMINOPHEN 325 MG PO TABS
650.0000 mg | ORAL_TABLET | Freq: Once | ORAL | Status: AC
  Administered 2015-01-12: 325 mg via ORAL
  Filled 2015-01-12: qty 2

## 2015-01-12 MED ORDER — GABAPENTIN 100 MG PO CAPS
100.0000 mg | ORAL_CAPSULE | Freq: Two times a day (BID) | ORAL | Status: DC
  Administered 2015-01-12 – 2015-01-14 (×2): 100 mg via ORAL
  Filled 2015-01-12 (×7): qty 1

## 2015-01-12 MED ORDER — GABAPENTIN 100 MG PO CAPS: 100.0000 mg | ORAL_CAPSULE | Freq: Two times a day (BID) | ORAL | Status: DC

## 2015-01-12 MED ORDER — OXYCODONE-ACETAMINOPHEN 5-325 MG PO TABS
1.0000 | ORAL_TABLET | Freq: Once | ORAL | Status: AC
Start: 2015-01-12 — End: 2015-01-12
  Administered 2015-01-12: 1 via ORAL
  Filled 2015-01-12: qty 1

## 2015-01-12 NOTE — ED Notes (Signed)
Contacts for patient: Anne Ng (aunt) [phone] Lenna Gilford (mother) [phone]

## 2015-01-12 NOTE — ED Notes (Signed)
PT in room C25.

## 2015-01-12 NOTE — ED Notes (Signed)
Dinner tray delivered.

## 2015-01-12 NOTE — ED Notes (Signed)
Pt given graham crackers and peanut butter.

## 2015-01-12 NOTE — Telephone Encounter (Signed)
Mother rtc, made appt but in process pt stated he could not breathe well and was going to die, called 911 for transport to ED, requested [place]

## 2015-01-12 NOTE — Telephone Encounter (Signed)
i have tried to reach [facility] and pt, will continue
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
## 2015-01-12 NOTE — Progress Notes (Signed)
Patient was referred to the following facilities for IP psych treatment: [facility] - per Nunzio Cory, fax referral. [facility] - per intake, adult male and geri beds open, fax it. [facility] - per intake, fax referral for review. [facility] - per intake, fax it. [facility] - left voicemail. [facility] - per Lavella Lemons, 1 adult male, fax referral. [facility] - no answer. [facility] - per intake, fax it for waitlist. [facility] - per Caryl Pina, fax referral, open geri adult and child/adolescent [facility] - per Gerald Stabs, fax it. [facility] - per intake, fax referral.  At capacity: [facility] - child/adolescent beds only. [facility]  [facility] will continue to seek placement.  Verlon Setting, [facility] Disposition staff 01/12/2015 5:14 PM

## 2015-01-12 NOTE — ED Notes (Signed)
Staffing called for sitter. Sitter needed according to psych eval by TTS for SI. No sitter from staffing available until 7pm. NT Chasity to sit with patient until sitter from staffing is available.

## 2015-01-12 NOTE — ED Notes (Signed)
Pts visitor: his child's mother, is requesting to speak with the MD but informed MD is not available to speak at the moment. She wishes to leave her phone number for him to call and answer her questions. Varney Biles: [phone]

## 2015-01-12 NOTE — ED Notes (Signed)
Dr. Plunkett at bedside.  

## 2015-01-12 NOTE — ED Notes (Signed)
Discussed with Dr. Maryan Rued safety concerns for the patient.  Patient is denying SI an HI ideations, no safety sitter required for this patient.

## 2015-01-12 NOTE — ED Notes (Signed)
Sitter from staffing available and at bedside.

## 2015-01-12 NOTE — ED Notes (Signed)
Pt requesting his Gabapentin but has not taken any of his psych medications in over 30 days. Dr. Aline Brochure aware and states he will come talk to patient regarding his medications. Pt and family updated.
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
## 2015-01-12 NOTE — ED Notes (Signed)
Pts child's mother is at bedside for visiting hours and is requesting that the pt have something for pain. Dr. Aline Brochure informed and tylenol ordered. Pt and visitor updated and happy with tylenol.

## 2015-01-12 NOTE — ED Notes (Signed)
TTS monitor placed at bedside with the patient.

## 2015-01-13 DIAGNOSIS — F329 Major depressive disorder, single episode, unspecified: Secondary | ICD-10-CM

## 2015-01-13 DIAGNOSIS — F323 Major depressive disorder, single episode, severe with psychotic features: Secondary | ICD-10-CM | POA: Diagnosis present

## 2015-01-13 DIAGNOSIS — F419 Anxiety disorder, unspecified: Secondary | ICD-10-CM | POA: Diagnosis not present

## 2015-01-13 MED ORDER — SERTRALINE HCL 50 MG PO TABS
100.0000 mg | ORAL_TABLET | Freq: Every day | ORAL | Status: DC
  Administered 2015-01-13 – 2015-01-16 (×3): 100 mg via ORAL
  Filled 2015-01-13 (×5): qty 2

## 2015-01-13 MED ORDER — BISACODYL 10 MG RE SUPP: 10.0000 mg | RECTAL | Status: DC | PRN

## 2015-01-13 MED ORDER — BENZTROPINE MESYLATE 1 MG PO TABS
0.5000 mg | ORAL_TABLET | Freq: Two times a day (BID) | ORAL | Status: DC
  Administered 2015-01-13: 0.5 mg via ORAL
  Filled 2015-01-13 (×4): qty 1

## 2015-01-13 MED ORDER — ONDANSETRON HCL 4 MG PO TABS: 4.0000 mg | ORAL_TABLET | Freq: Three times a day (TID) | ORAL | Status: DC | PRN

## 2015-01-13 MED ORDER — ACETAMINOPHEN 325 MG PO TABS
650.0000 mg | ORAL_TABLET | ORAL | Status: DC | PRN
  Administered 2015-01-13 (×2): 650 mg via ORAL
  Administered 2015-01-14: 325 mg via ORAL
  Filled 2015-01-13 (×3): qty 2

## 2015-01-13 MED ORDER — DOCUSATE SODIUM 100 MG PO CAPS
100.0000 mg | ORAL_CAPSULE | Freq: Every day | ORAL | Status: DC | PRN
  Administered 2015-01-13: 100 mg via ORAL
  Filled 2015-01-13: qty 1

## 2015-01-13 MED ORDER — RISPERIDONE 1 MG PO TBDP
1.0000 mg | ORAL_TABLET | Freq: Two times a day (BID) | ORAL | Status: DC
  Administered 2015-01-13 – 2015-01-15 (×2): 1 mg via ORAL
  Filled 2015-01-13 (×8): qty 1

## 2015-01-13 MED ORDER — TRAZODONE HCL 100 MG PO TABS
100.0000 mg | ORAL_TABLET | Freq: Every day | ORAL | Status: DC
  Filled 2015-01-13 (×2): qty 1

## 2015-01-13 MED ORDER — ALUM & MAG HYDROXIDE-SIMETH 200-200-20 MG/5ML PO SUSP: 30.0000 mL | ORAL | Status: DC | PRN

## 2015-01-13 MED ORDER — BACLOFEN 10 MG PO TABS: 10.0000 mg | ORAL_TABLET | ORAL | Status: DC

## 2015-01-13 MED ORDER — LORAZEPAM 1 MG PO TABS
1.0000 mg | ORAL_TABLET | Freq: Three times a day (TID) | ORAL | Status: DC | PRN
  Administered 2015-01-13 – 2015-01-14 (×3): 1 mg via ORAL
  Filled 2015-01-13 (×6): qty 1

## 2015-01-13 MED ORDER — AMMONIUM LACTATE 12 % EX LOTN
1.0000 "application " | TOPICAL_LOTION | CUTANEOUS | Status: DC | PRN
  Filled 2015-01-13: qty 400

## 2015-01-13 MED ORDER — BACLOFEN 10 MG PO TABS
10.0000 mg | ORAL_TABLET | Freq: Two times a day (BID) | ORAL | Status: DC
  Administered 2015-01-14: 10 mg via ORAL
  Filled 2015-01-13 (×5): qty 1

## 2015-01-13 MED ORDER — IBUPROFEN 400 MG PO TABS: 600.0000 mg | ORAL_TABLET | Freq: Three times a day (TID) | ORAL | Status: DC | PRN

## 2015-01-13 MED ORDER — TRIAMCINOLONE 0.1 % CREAM:EUCERIN CREAM 1:1: 1.0000 "application " | TOPICAL_CREAM | Freq: Three times a day (TID) | CUTANEOUS | Status: DC | PRN

## 2015-01-13 MED ORDER — OXYBUTYNIN CHLORIDE 5 MG PO TABS
5.0000 mg | ORAL_TABLET | Freq: Three times a day (TID) | ORAL | Status: DC
  Administered 2015-01-13 – 2015-01-16 (×9): 5 mg via ORAL
  Filled 2015-01-13 (×13): qty 1

## 2015-01-13 MED ORDER — BACLOFEN 20 MG PO TABS
20.0000 mg | ORAL_TABLET | Freq: Every day | ORAL | Status: DC
  Filled 2015-01-13 (×2): qty 1

## 2015-01-13 NOTE — ED Notes (Addendum)
Dr. Aline Brochure at bedside and states he will order a medication to help him with his bladder spasm concerns.

## 2015-01-13 NOTE — ED Notes (Addendum)
PT SPOKE W/HIS MOTHER VIA PHONE. MOTHER THEN CALLED THIS RN - PT VERBALIZED PERMISSION TO SPEAK W/HER ABOUT HIS CARE. MOTHER, JONNIE [name], ADVISED PT HAS BEEN PERFORMING SELF-CATH'S AT [facility] - APPROX EVERY 20 - 30 MIN; THAT HE LIVES ALONE BUT CALLS HIS BROTHER AND HER QUITE OFTEN TO COME OVER, AND; HE LIVES ALONE. STATES SHE IS BECOMING WORN OUT FROM HIS CALLS AND GOING TO HIS HOME SO MUCH. ADVISED SHE LOVES HIM AND REALIZES HE NEEDS HELP THAT SHE CANNOT PROVIDE. STATES SHE LIVES IN AN ELDER HOUSING UNIT AND SUGGESTED TO HIM TO "GET A SMALLER PLACE - SOMETHING LIKE I'M LIVING IN".

## 2015-01-13 NOTE — ED Notes (Addendum)
Offered pt ativan for CP and bladder spasms. Refused, stating he needs to use bathroom. HR 65. Pt making obsessive comments about using the bathroom d/t concerns for autonomic dysreflexia and possibility for UTI. Bladder scan shows 113 ML. Pt educated about UTI s/s, and informed that is urine is clear, yellow and without odor therefore UTI unlikely. Pt reassured by RN that bladder is not full and he can wait to cath. Plan to cath every 2-3 hours. Worried about bowels. Pt had soft bowel this morning, pt informed that bowel program is in place.

## 2015-01-13 NOTE — ED Notes (Signed)
Pt refusing all nighttime meds except for oxybutynin. Explained to patient the importance of taking his medications regularly to reduce spasms, pain, anxiety, etc. Pt continuing to refuse, but asking if he can take meds later. Rn agreed to this.

## 2015-01-13 NOTE — ED Notes (Signed)
Pt told sitter he would like to speak with the RN. This RN went to talk to patient and patient was concerned that he is dehydrated and was requesting IV fluids and foley catheter because "I drink water and then I pee". This RN reassured patient that he is not dehydrated because he has been drinking water all day and vital signs are WDL. He is concerned that he is dying, 'Man, Im gonna die tonight." This RN reassured patient this would not happen and to attempted to calm him down. Pt also concerned about having bladder spasms and requesting medications for this. This RN asked if he would like me to see about an order for Ativan but pt states he is not anxious. Dr. Aline Brochure informed and asked to speak with patient regarding his anxiety and bladder spasm concerns.

## 2015-01-13 NOTE — ED Notes (Signed)
Telepsych completed w/Conrad, NP, [facility].

## 2015-01-13 NOTE — ED Notes (Signed)
Bilateral feet elevated on pillow.

## 2015-01-13 NOTE — ED Notes (Signed)
Materials brought more urethral tray catheters for pt d/t frequent self-catheterizing d/t c/o spasms. Encouraging pt to relax.

## 2015-01-13 NOTE — ED Notes (Signed)
PT'S MOTHER CALLED CHECKING ON PT. PT GAVE VERBAL PERMISSION TO SPEAK W/HER RE: HIS CARE. MOTHER ASKED IF PT WAS GIVING STAFF "A HARD TIME". ADVISED MOTHER HE APPEARS IN A MORE CALM STATE THAN HE WAS THIS AM. ADVISED HER CONTINUING TO WAIT FOR PLACEMENT. SHE VOICED APPRECIATION, ADVISING "HE NEEDS HELP". SHE REQUESTED RN TO ADVISE PT SHE "LOVES HIM" - STATES SHE WILL CALL TO CHECK ON HIM LATER. PT AWARE.

## 2015-01-13 NOTE — ED Notes (Signed)
Pt's sister, Mackay Hanauer - [phone] - called checking on pt. Pt gave verbal permission to speak w/her about his care.

## 2015-01-13 NOTE — ED Notes (Addendum)
Consulted Dr. Betsey Holiday d/t pt's continued anxiety r/t urine output. Pt requesting foley catheter. Not indicated at this time, urine remains clear yellow with no odor, no retention. RN explained foley indications/policy to pt. MD offering pt condom catheter. Pt refusing.  Pt asking to use phone, previous shift RN has documented two phone calls so pt will not be able to use phone until tomorrow. Pt informed of this.

## 2015-01-13 NOTE — ED Notes (Signed)
[place], advised pt moved her away quickly w/his hand as she was attempting to wipe gel from pt's abd. Pt then apologized - advising he did it bec he was upset.

## 2015-01-13 NOTE — ED Notes (Signed)
Pt sleeping, only used two cath kits so far this shift - doing well with q2hour routine

## 2015-01-13 NOTE — ED Notes (Signed)
Dr Regenia Skeeter aware pt performed self-cath x 20 min ago and is requesting to do so again. Dr Regenia Skeeter advised to perform bladder scan and if no significant amount, may repeat later as needed. Bladder scan performed - approx 74ml. Dr Regenia Skeeter aware. Explained to pt may not self-cath at this time. Pt noted to be upset. RN allowed pt to vent feelings.

## 2015-01-13 NOTE — ED Notes (Signed)
Pt using 1-2 intermittent straight caths per hour. Ordered more from Pharmacist, community. Charge RN aware.

## 2015-01-13 NOTE — ED Notes (Signed)
Pt has used six male 16 fr "in and out" catheter trays thus far during this RN's shift. Pt continuing to ask for more. Pt given two more just now.

## 2015-01-13 NOTE — ED Notes (Signed)
Pt on phone with mother at this time 

## 2015-01-13 NOTE — ED Notes (Signed)
Bladder scan performed on pt - approx 380ml present. Advised pt may self-cath as requested.

## 2015-01-13 NOTE — ED Notes (Signed)
Pt reports does bowel program every other day with Dulcolex PR and colace x 1 tab qd.  Did have a BM today with no medication prompt, pt states was softer than usual.  Has had problems with constipation lately.
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
## 2015-01-13 NOTE — ED Notes (Signed)
Offered patient a book to read, pt declined.  Offered patient to get up to chair, pt declined.

## 2015-01-14 LAB — URINALYSIS, ROUTINE W REFLEX MICROSCOPIC
BILIRUBIN URINE: NEGATIVE
Glucose, UA: NEGATIVE mg/dL
Hgb urine dipstick: NEGATIVE
Ketones, ur: NEGATIVE mg/dL
Nitrite: NEGATIVE
Protein, ur: NEGATIVE mg/dL
SPECIFIC GRAVITY, URINE: 1.006 (ref 1.005–1.030)
UROBILINOGEN UA: 0.2 mg/dL (ref 0.0–1.0)
pH: 6 (ref 5.0–8.0)

## 2015-01-14 LAB — URINE MICROSCOPIC-ADD ON

## 2015-01-14 NOTE — ED Notes (Signed)
Pt making clear pale yellow urine at this time. Pt making delusional statements that "this is bad pee, this is why I keep losing weight. I'm gonna die if I keep peeing like this." Pt educated about what "bad pee" looks like and continued to reassure him that he does not have a UTI d/t appearance/odor of urine. Pt continues to make negative statements r/t urine.

## 2015-01-14 NOTE — ED Notes (Signed)
Pt making obsessive comments about having a stroke or a heart attack. No s/s of either. Pt educated on s/s that would be concerning for these dx. Pt continues to make comments r/t dying.

## 2015-01-14 NOTE — ED Notes (Signed)
Asked Dr Regenia Skeeter for f/c per pt request d/t pt frequently having to perform self-in & out cath. Pt refusing for staff to perform in & out. Dr Regenia Skeeter advised no - no order received. Pt aware.

## 2015-01-14 NOTE — ED Notes (Signed)
Spoke w/pt's mother - pt had given verbal permission to discuss his care. Advised her pt refusing to take his medications. She asked if there was a way we could force him to do so. Advised her not at this time. She then asked to speak w/pt - pt talked w/her.

## 2015-01-15 DIAGNOSIS — F419 Anxiety disorder, unspecified: Secondary | ICD-10-CM | POA: Diagnosis not present

## 2015-01-15 DIAGNOSIS — Z9114 Patient's other noncompliance with medication regimen: Secondary | ICD-10-CM

## 2015-01-15 DIAGNOSIS — F323 Major depressive disorder, single episode, severe with psychotic features: Secondary | ICD-10-CM

## 2015-01-15 LAB — I-STAT CHEM 8, ED
BUN: 12 mg/dL (ref 6–20)
CHLORIDE: 94 mmol/L — AB (ref 101–111)
Calcium, Ion: 1.24 mmol/L — ABNORMAL HIGH (ref 1.12–1.23)
Creatinine, Ser: 0.8 mg/dL (ref 0.61–1.24)
Glucose, Bld: 113 mg/dL — ABNORMAL HIGH (ref 65–99)
HCT: 46 % (ref 39.0–52.0)
HEMOGLOBIN: 15.6 g/dL (ref 13.0–17.0)
Potassium: 4 mmol/L (ref 3.5–5.1)
Sodium: 133 mmol/L — ABNORMAL LOW (ref 135–145)
TCO2: 26 mmol/L (ref 0–100)

## 2015-01-15 MED ORDER — BACLOFEN 10 MG PO TABS
10.0000 mg | ORAL_TABLET | Freq: Three times a day (TID) | ORAL | Status: DC
Start: 2015-01-15 — End: 2015-01-16
  Administered 2015-01-15 (×3): 10 mg via ORAL
  Filled 2015-01-15 (×6): qty 1

## 2015-01-15 NOTE — ED Notes (Signed)
Spoke with pt and his "baby's mother" re: pt's readiness to d/c home.  Pt denies that he is ready to go home, that he needs antibiotics and "to go to a facility."  Explained to pt that he is medically and psychiatrically cleared and ready to d/c.  Explained to pt that if he wanted placement in a NH that this was a process that could be facilitated from his home with DSS/Adult Services assistance. Pt stating that he "has nowhere to go" and wants to go and live with his mother. CSW called to phone to speak with pt's mother.  Pt's mother upset because "nobody told me anything all day" and that she was tired of taking care of pt and she thought "someone was going to get pt extra care" and placement in a NH.  Explained to pt's mother that DSS would be the most appropriate agency to assist pt with his increased care needs and for placement in a facility.  CSW also encouraged pt's mother to reach out to pt's [facility] RN to see if she could secure [facility] CSW services for pt.  Pt's mother concerned that pt's wheelchair is having battery/charger issues and she contacted [facility] today about getting it fixed, and is expecting it to be repaired in the am.  Mother concerned about pt not having his wheelchair tonight and her inability to provide any care or supervision to pt; She requests that he be d/c'ed in the am when he can have access to his w/c.  RN updated and spoke with MD.  Dayshift CSW will f/u with pt in the am.

## 2015-01-15 NOTE — Progress Notes (Addendum)
1:00pm:  Call received from Gordy Councilman:  [phone] (patient's caregiver) Called caregiver back and left message. Will follow up once call returned in effort to have patient be discharged to home setting.  1510:  Spoke with patient's caregiver Anne Ng, his mother Rolan Lipa and his sister Lattie Haw.  All are aware patient is up for DC. Mother cannot provide care, sister reports she has a life and cannot care for him, but has a key to his home and can check in on. Anne Ng is aware of him and will be home to let him in the door.     LCSW following for disposition as Psych MD has cleared patient and he can return home with outpatient follow up. Patient follows at [facility] for therapy and PCP is family practice downstairs at [facility]. He has an aid on a daily basis from 10-2 and 6-10. Patient does live alone with family staying with him occasionally.  Mother is 21 years old and reports her own medical problems hindering her ability to care for him.  Mother is aware of discharge and calling patient's aide in effort to have her home and accept patient. Mother to call and let us know when to send via ambulance.  Lane Hacker, MSW Clinical Social Work: Emergency Room [phone]

## 2015-01-15 NOTE — ED Notes (Addendum)
Spoke with mother on the phone. Stated, "I have not heard from anyone. They said they would send him home and the assistant is there waiting for him. I need some help with 24 hour care. I cannot be there for him all the time and I am 44 years old. I live across town and he is alone at times. They need to put him in a facility or something. Also, his chair is broken. The aide says it is not turning on" Told Mother, I would speak with SW about patient's care. Caregiver is waiting at the house for patient. CSW made aware.

## 2015-01-15 NOTE — ED Notes (Signed)
Spoke with SW. To call [facility] to take patient home between 1630-1700. Asked patient to call his mother.

## 2015-01-15 NOTE — ED Notes (Signed)
Phlebotomy at the bedside  

## 2015-01-15 NOTE — ED Notes (Signed)
Patient reports, "I feel like I am going into a coma." Patient made aware of the medical signs and symptoms of a coma. Reassured that his vital signs do not match with this complaint. Reassured that all medical blood work indicates he is doing well. Patient verbalized understanding. Given graham crackers. Remains on the monitor. Patient is in NAD.

## 2015-01-15 NOTE — ED Notes (Signed)
Patient is asleep with sitter at the bedside.

## 2015-01-15 NOTE — ED Notes (Signed)
Pt refusing his medications states "you are doing experiments with me, I don't need those medications" pt encouraged to take his medication so he can recover soon, but pt still refuses, MD notified.
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
## 2015-01-15 NOTE — ED Notes (Signed)
Pt states he is willing to take low dose of medications, because he is scare of died from taking to much medication like "Prince"

## 2015-01-15 NOTE — ED Notes (Addendum)
Spoke with Mother of patient at this time. Mother stated, "No one is keeping me updated at all. NO one is telling me anything. I haven't spoken with a doctor. I haven't spoke with a nurse. I called over there this morning and the nurse said that he did not want her to give information and He would call me." Mother made aware due to age the patient has to give permission to release information. Mother verbalized understanding.

## 2015-01-15 NOTE — ED Notes (Signed)
Spoke with patient. Patient verbalized consent to speak with mother. Patient states, "I do not feel like talking at this time."

## 2015-01-15 NOTE — ED Notes (Signed)
Phlebotomy notified to draw patient's blood.

## 2015-01-15 NOTE — ED Notes (Signed)
Jody, CSW spoke with family member on the phone.

## 2015-01-15 NOTE — ED Provider Notes (Signed)
Saw the patient today and he has not taken any medication since he has been waiting in the ER for the last 4 days. Today he is complaining of feeling generally weak and he knows that he is dehydrated. Patient has been eating and drinking here his urine is clear and his chem 8 is unchanged. Reassured the patient that he is not dehydrated and he does not need fluids. By psychiatry today who feels that he is safe to be discharged home. The patient states has not changed since the first day that I saw him and he is still very anxious about his health, over occupied with self cathing and having an underlying medical problem.  Patient does have some outpatient resources however after speaking with him in length a did convince him to take 1 dose of his Risperdal and Zoloft which is the first time he's taken medication since he's been here. We'll have case management work on possible affects he involvement as an outpatient to ensure patient's taking his medication because at this time I feel that he will come right back with the same complaints because he is not taking his medications.  Blanchie Dessert, MD 01/15/15 1654

## 2015-01-15 NOTE — ED Notes (Addendum)
Pt mother called and states, "My son feels like he can't breath. He just tole me this." Mother reassured that patient was in no acute distress, and patient remained on the monitor with no distress. Reassured mother that medically the MD has seen no medical cause for this feeling and the patient has been reassured he is okay. Patient is 100% on RA, 92 HR, 18 RR, BP 108/77

## 2015-01-16 ENCOUNTER — Telehealth: Payer: Self-pay | Admitting: Internal Medicine

## 2015-01-16 DIAGNOSIS — N319 Neuromuscular dysfunction of bladder, unspecified: Secondary | ICD-10-CM

## 2015-01-16 NOTE — Discharge Instructions (Signed)
°Depression °Depression refers to feeling sad, low, down in the dumps, blue, gloomy, or empty. In general, there are two kinds of depression: °1. Normal sadness or normal grief. This kind of depression is one that we all feel from time to time after upsetting life experiences, such as the loss of a job or the ending of a relationship. This kind of depression is considered normal, is short lived, and resolves within a few days to 2 weeks. Depression experienced after the loss of a loved one (bereavement) often lasts longer than 2 weeks but normally gets better with time. °2. Clinical depression. This kind of depression lasts longer than normal sadness or normal grief or interferes with your ability to function at home, at work, and in school. It also interferes with your personal relationships. It affects almost every aspect of your life. Clinical depression is an illness. °Symptoms of depression can also be caused by conditions other than those mentioned above, such as: °· Physical illness. Some physical illnesses, including underactive thyroid gland (hypothyroidism), severe anemia, specific types of cancer, diabetes, uncontrolled seizures, heart and lung problems, strokes, and chronic pain are commonly associated with symptoms of depression. °· Side effects of some prescription medicine. In some people, certain types of medicine can cause symptoms of depression. °· Substance abuse. Abuse of alcohol and illicit drugs can cause symptoms of depression. °SYMPTOMS °Symptoms of normal sadness and normal grief include the following: °· Feeling sad or crying for short periods of time. °· Not caring about anything (apathy). °· Difficulty sleeping or sleeping too much. °· No longer able to enjoy the things you used to enjoy. °· Desire to be by oneself all the time (social isolation). °· Lack of energy or motivation. °· Difficulty concentrating or remembering. °· Change in appetite or weight. °· Restlessness or  agitation. °Symptoms of clinical depression include the same symptoms of normal sadness or normal grief and also the following symptoms: °· Feeling sad or crying all the time. °· Feelings of guilt or worthlessness. °· Feelings of hopelessness or helplessness. °· Thoughts of suicide or the desire to harm yourself (suicidal ideation). °· Loss of touch with reality (psychotic symptoms). Seeing or hearing things that are not real (hallucinations) or having false beliefs about your life or the people around you (delusions and paranoia). °DIAGNOSIS  °The diagnosis of clinical depression is usually based on how bad the symptoms are and how long they have lasted. Your health care provider will also ask you questions about your medical history and substance use to find out if physical illness, use of prescription medicine, or substance abuse is causing your depression. Your health care provider may also order blood tests. °TREATMENT  °Often, normal sadness and normal grief do not require treatment. However, sometimes antidepressant medicine is given for bereavement to ease the depressive symptoms until they resolve. °The treatment for clinical depression depends on how bad the symptoms are but often includes antidepressant medicine, counseling with a mental health professional, or both. Your health care provider will help to determine what treatment is best for you. °Depression caused by physical illness usually goes away with appropriate medical treatment of the illness. If prescription medicine is causing depression, talk with your health care provider about stopping the medicine, decreasing the dose, or changing to another medicine. °Depression caused by the abuse of alcohol or illicit drugs goes away when you stop using these substances. Some adults need professional help in order to stop drinking or using drugs. °SEEK IMMEDIATE MEDICAL   CARE IF: °· You have thoughts about hurting yourself or others. °· You lose touch  with reality (have psychotic symptoms). °· You are taking medicine for depression and have a serious side effect. °FOR MORE INFORMATION °· National Alliance on Mental Illness: www.nami.org  °· National Institute of Mental Health: www.nimh.nih.gov  °Document Released: 06/20/2000 Document Revised: 11/07/2013 Document Reviewed: 09/22/2011 °ExitCare® Patient Information ©2015 [employer]. This information is not intended to replace advice given to you by your health care provider. Make sure you discuss any questions you have with your health care provider. °  Emergency Department Resource Guide °1) Find a Doctor and Pay Out of Pocket °Although you won't have to find out who is covered by your insurance plan, it is a good idea to ask around and get recommendations. You will then need to call the office and see if the doctor you have chosen will accept you as a new patient and what types of options they offer for patients who are self-pay. Some doctors offer discounts or will set up payment plans for their patients who do not have insurance, but you will need to ask so you aren't surprised when you get to your appointment. ° °2) Contact Your Local Health Department °Not all health departments have doctors that can see patients for sick visits, but many do, so it is worth a call to see if yours does. If you don't know where your local health department is, you can check in your phone book. The CDC also has a tool to help you locate your state's health department, and many state websites also have listings of all of their local health departments. ° °3) Find a Walk-in Clinic °If your illness is not likely to be very severe or complicated, you may want to try a walk in clinic. These are popping up all over the country in pharmacies, drugstores, and shopping centers. They're usually staffed by nurse practitioners or physician assistants that have been trained to treat common illnesses and complaints. They're usually fairly  quick and inexpensive. However, if you have serious medical issues or chronic medical problems, these are probably not your best option. ° °No Primary Care Doctor: °- Call [facility] Connect at  832-8000 - they can help you locate a primary care doctor that  accepts your insurance, provides certain services, etc. °- Physician Referral Service- 1-800-533-3463 ° °Chronic Pain Problems: °Organization         Address  Phone   Notes  °[facility]  (336) 297-2271 Patients need to be referred by their primary care doctor.  ° °Medication Assistance: °Organization         Address  Phone   Notes  °[place] [facility] [address] °[place], [place] [address] (336) 641-8030 --Must be a resident of [place] °-- Must have NO insurance coverage whatsoever (no Medicaid/ Medicare, etc.) °-- The pt. MUST have a primary care doctor that directs their care regularly and follows them in the community °  °[facility]  (866) 331-1348   °[employer] Way  (888) 892-1162   ° °Agencies that provide inexpensive medical care: °Organization         Address  Phone   Notes  °[facility]  (336) 832-8035   °[facility]    (336) 832-7272   °[facility] [address] °[place], [place] [address] (336) 832-4777   °[facility] [address], ° (336) 271-4999   °  [facility]    (336) 373-0678   °[facility]    (336) 272-1050   °[facility] ° [address], [place] Phone:  (336) 832-4444, Fax:  (336) 832-4440 Hours of Operation:  9 am - 6 pm, M-F.  Also accepts Medicaid/Medicare and self-pay.  °[facility] ° [address], [place] Phone: (336) 832-3150, Fax: (336) 832-3151. Hours of Operation:  8:30 am - 5:30 pm, M-F.  Also accepts Medicaid and self-pay.  °[facility] [place] [address], [place] Phone: (336) 878-6027    °[facility] [address], [place], [place] (336)723-1848, Ext. 123 Mondays & Thursdays: 7-9 AM.  First 15 patients are seen on a first come, first serve basis. °  ° °Medicaid-accepting [place] Providers: ° °Organization         Address  Phone   Notes  °[facility] [address], [place] (336) 641-2100 Also accepts self-pay patients.  °[facility] [address], [place] ° (336) 856-9996   °[facility] [address], [place] (336) 288-8857   °[facility] [address] [place] [address], [place] (336) 299-7000   °Veita Bland [address], [place]  ° (336) 373-1557 Only accepts [place] Access Medicaid patients after they have their name applied to their card.  ° °Self-Pay (no insurance) in [place]: ° °Organization         Address  Phone   Notes  °Sickle Cell Patients, [facility] [address], [place] (336) 832-1970   °[facility] [address], [place] (336) 832-4400   °[facility] [place] ° [address], [place] (336) 992-4800   °[facility]/Dr. Osei-Bonsu ° [address] [place] [address], [place] or [address], [place] (336) 841-8500 Phone number for both [place] and [place] locations is the same.  °[facility] [address] [address], [place] (336) 299-0000   °[facility] [address] [place] [address], [place] or [address] (336) 852-7530 °(336) 878-2260   °[facility] [address], [place] (336) 350-1642, phone; (336) 294-5005, fax Sees patients 1st and 3rd Saturday of every month.  Must not qualify for public or private insurance (i.e. Medicaid, Medicare, [place] Health Choice, Veterans' Benefits) • Household income should be no more than 200% of the poverty level •The clinic cannot treat you if you are  pregnant or think you are pregnant • Sexually transmitted diseases are not treated at the clinic.  ° °Dental Care: °Organization         Address  Phone  Notes  °[place] [facility] [address],  (336) 641-6152 Accepts children up to age 21 who are enrolled in Medicaid or [place] Health Choice; pregnant women with a Medicaid card; and children who have applied for Medicaid or [place] Health Choice, but were declined, whose parents can pay a reduced fee at time of service.  °[facility] [place]  [address], [place] (336) 641-7733 Accepts children up to age 21 who are enrolled in Medicaid or [place] Health Choice; pregnant women with a Medicaid card; and children who have applied for Medicaid or [place] Health Choice, but were declined, whose   parents can pay a reduced fee at time of service.  °[facility] ° [address], [place] (336) 641-4533 Patients are seen by appointment only. Walk-ins are not accepted. [facility] will see patients 18 years of age and older. °Monday - Tuesday (8am-5pm) °Most Wednesdays (8:30-5pm) °$30 per visit, cash only  °[facility] ° [address], [place] (336) 641-4533 Patients are seen by appointment only. Walk-ins are not accepted. [facility] will see patients 18 years of age and older. °One Wednesday Evening (Monthly: Volunteer Based).  $30 per visit, cash only  °[facility]  (919) 537-3737 for adults; Children under age 4, call Graduate Pediatric Dentistry at (919) [phone]. Children aged 4-14, please call (919) [phone] to request a pediatric application. ° Dental services are provided in all areas of dental care including fillings, crowns and bridges, complete and partial dentures, implants, gum treatment, root canals, and extractions. Preventive care is also provided. Treatment is provided to  both adults and children. °Patients are selected via a lottery and there is often a waiting list. °  °[facility] [address], °[place] ° (336) 763-8833 www.drcivils.com °  °[facility] [address], [place], [place] (336)723-1848, Ext. 123 Second and Fourth Thursday of each month, opens at 6:30 AM; Clinic ends at 9 AM.  Patients are seen on a first-come first-served basis, and a limited number are seen during each clinic.  ° °[facility] ° [address], [place], [place] (336) 723-7904   Eligibility Requirements °You must have lived in [place], [place], or [place] counties for at least the last three months. °  You cannot be eligible for state or federal sponsored healthcare insurance, including Veterans Administration, Medicaid, or Medicare. °  You generally cannot be eligible for healthcare insurance through your employer.  °  How to apply: °Eligibility screenings are held every Tuesday and Wednesday afternoon from 1:00 pm until 4:00 pm. You do not need an appointment for the interview!  °[facility] [address], [place], [place] 336-631-2330   °[place] [facility]  336-342-8273   °[place]  336-703-3100   °[facility]  336-570-6415   ° °Behavioral Health Resources in the Community: °Intensive Outpatient Programs °Organization         Address  Phone  Notes  °[facility] [address], [place], [place] 336-878-6098   °[facility] [address], [place], [place] 336-832-9800   °[facility] [address], [place], [place] ° 336-882-2125   °[facility] [address],  °[place], [place] 1-800-853-5163 or 336-641-4981   °Substance Abuse Resources °Organization         Address  Phone  Notes  °[facility]  336-882-2125   °[facility]  336-784-9470   °[facility]   336-285-9073   °[facility]  336-845-3988   °Residential & Outpatient Substance Abuse Program  1-800-659-3381   °Psychological Services °Organization         Address  Phone  Notes  °[facility]  336- 832-9600   °[facility]  336- 378-7881   °[facility] [address], [place] 1-800-853-5163 or 336-641-4981   ° °Mobile Crisis Teams °Organization         Address  Phone  Notes  °[facility], Mobile Crisis   Care Unit  1-877-626-1772  °Assertive °[facility] ° [address] [place], [place] 336-834-9664  °Sharon DeEsch [address] °[place] 336-554-5454  ° °Self-Help/Support Groups °Organization         Address  Phone             Notes  °[facility] [place] - variety of support groups  336- 373-1402 Call for more information  °Narcotics Anonymous (NA), [facility] [address], °[place]  2 meetings at this location  ° °Residential Treatment Programs °Organization         Address  Phone  Notes  °[facility] [address],    °[place]  1-866-801-8205   °[facility] ° [address], [place], [place] 704-293-8524   °[facility] [address], [place] 336-845-3988 Admissions: 8am-3pm M-F  °[facility] [address]    °[place], [place] 336-841-1104   °[facility] [address], [place], [place] 336-379-7146   °[facility] [address]  °[place], [place] 336-285-9073   °[facility] Intensive Outpatient [address], [place], [place] 336-852-3033   °[facility] [address]  °[place], [place] 1-877-615-2722 or 336-784-9470   °[facility] [address] [place], [place] 336-227-7417 Accepts Medicaid  °[facility] [address]  °[place] 1-800-659-3381 Substance Abuse/Addiction Treatment  ° °[place] Behavioral Health Resources °Organization         Address  Phone  Notes  °[facility]  (888) 581-9988   °Julie Brannon, PhD [address] [place], [place]   (336) 349-5553 or (336) 951-0000   °[facility]   [address] °[place], [place] (336) 349-4454   °[facility] [address], [place], [place] (336) 342-8316 Insurance/Medicaid/sponsorship through [facility]  °[facility] [address]                                    [place], [place] (336) 342-8316 Therapy/tele-psych/case  °[facility] [address]  ° [place], [place] (336) 349-2233    °Dr. Arfeen  (336) 349-4544   °[facility] [place]  [employer] [place] [facility] 1) [address], [place] °2) [address], [place] °3)  [address], [place] (336) 349-3220 °(336) 342-7768 ° °(336) 342-8140   °[place] Child Abuse [facility] (336) 342-1394 or (336) 342-3537 (After Hours)    ° °   °

## 2015-01-16 NOTE — ED Notes (Signed)
Pt refused Ativan for anxiety

## 2015-01-16 NOTE — ED Notes (Addendum)
Pt sent home with bagged lunch.

## 2015-01-16 NOTE — ED Notes (Signed)
[facility] called for transport.  
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
## 2015-01-16 NOTE — Telephone Encounter (Signed)
Mother calling asking if she can get a order to get some of the chucks for the patient to lay on.

## 2015-01-16 NOTE — ED Notes (Signed)
Sitter let RN know pt was c/o chest pain.  MD aware, EKG completed, VSS.  Ok to discharge per MD Wilson Singer.

## 2015-01-16 NOTE — Telephone Encounter (Signed)
Talked with pt's mother and he has not needed Chucks for a long time.  She thinks she used to get them from [facility]. Please plase a DME order for Chucks, then forward to [place].

## 2015-01-16 NOTE — Progress Notes (Addendum)
10:20am: Patient mother called this morning with regards to "no one calling and letting her know what is going on" along with "needing his wheelchair fixed". LCSW clarified with mother that in fact she had spoken to this writer numerous times all day on 7/11. Mother confirmed. As well as LCSW spoke with patient's child mother, aide in home, and his sister, with all understanding he was to be discharged. Mother confirmed. Mother changes her statements reporting "I did not speak to no doctor". Mother fully aware of recommendations for home and outpatient follow up along with patient being medically and psychiatrically cleared.  10:30am: patient reports he is not going to his home he is actually going to his mother's home and asked LCSW to speak with mother. LCSW declined, patient is a grown adult and told to speak to his own mother.  Patient complies and is able to get address from mother on his own and confirm plans. Patient asking for DME for self-cath per RN. HH will be able to facilitate request once patient is at home.   CM has spoken with [facility] regarding patient's wheelchair as well as getting SW in the home for patient.  Mother confirms to send patient to his home as his aide is awaiting for him. RN is calling EMS. No other barriers, [place] home.   Disposition: Home with EMS.  1.patient to be discharged on 7/11 via EMS however EDP not comfortable with discharge 2. Patient is to return home via EMS today and wheelchair to be fixed by [facility]. 3. Patient has been medically and psychiatrically cleared on 7/11.    LCSW to have RN call for transport once mother returns call along with [facility] worker [place].  Due to patient being in ED 6 times in last 6 months, care coordination referral to be made on behalf of patient due to increased anxiety (Ty Ty), high use of ED, multiple crisis situations.   Lane Hacker, MSW Clinical Social Work: Emergency Room [phone]

## 2015-01-17 DIAGNOSIS — R339 Retention of urine, unspecified: Secondary | ICD-10-CM | POA: Insufficient documentation

## 2015-01-17 NOTE — Telephone Encounter (Signed)
Order has been done.

## 2015-01-18 ENCOUNTER — Ambulatory Visit (INDEPENDENT_AMBULATORY_CARE_PROVIDER_SITE_OTHER): Payer: Medicaid Other | Admitting: Internal Medicine

## 2015-01-18 ENCOUNTER — Telehealth: Payer: Self-pay | Admitting: Licensed Clinical Social Worker

## 2015-01-18 ENCOUNTER — Encounter: Payer: Self-pay | Admitting: Licensed Clinical Social Worker

## 2015-01-18 ENCOUNTER — Ambulatory Visit (HOSPITAL_COMMUNITY)
Admission: RE | Admit: 2015-01-18 | Discharge: 2015-01-18 | Disposition: A | Discharge status: Home / Self Care | Payer: Medicaid Other | Source: Ambulatory Visit | Attending: Internal Medicine | Admitting: Internal Medicine

## 2015-01-18 ENCOUNTER — Ambulatory Visit (HOSPITAL_COMMUNITY): Payer: Medicaid Other

## 2015-01-18 ENCOUNTER — Telehealth: Payer: Self-pay | Admitting: *Deleted

## 2015-01-18 VITALS — BP 107/75 | HR 83 | Temp 97.5°F

## 2015-01-18 DIAGNOSIS — F419 Anxiety disorder, unspecified: Secondary | ICD-10-CM

## 2015-01-18 DIAGNOSIS — G8220 Paraplegia, unspecified: Secondary | ICD-10-CM | POA: Diagnosis not present

## 2015-01-18 DIAGNOSIS — R634 Abnormal weight loss: Secondary | ICD-10-CM | POA: Insufficient documentation

## 2015-01-18 DIAGNOSIS — F418 Other specified anxiety disorders: Secondary | ICD-10-CM | POA: Diagnosis not present

## 2015-01-18 DIAGNOSIS — R918 Other nonspecific abnormal finding of lung field: Secondary | ICD-10-CM | POA: Insufficient documentation

## 2015-01-18 DIAGNOSIS — F329 Major depressive disorder, single episode, unspecified: Secondary | ICD-10-CM

## 2015-01-18 DIAGNOSIS — Z1211 Encounter for screening for malignant neoplasm of colon: Secondary | ICD-10-CM

## 2015-01-18 DIAGNOSIS — Z993 Dependence on wheelchair: Secondary | ICD-10-CM

## 2015-01-18 DIAGNOSIS — F32A Depression, unspecified: Secondary | ICD-10-CM

## 2015-01-18 LAB — C-REACTIVE PROTEIN

## 2015-01-18 LAB — HEMOGLOBIN A1C
HEMOGLOBIN A1C: 5.5 % (ref <5.7)
MEAN PLASMA GLUCOSE: 111 mg/dL (ref <117)

## 2015-01-18 LAB — T3, FREE: T3 FREE: 3.5 pg/mL (ref 2.3–4.2)

## 2015-01-18 LAB — T4, FREE: FREE T4: 1.36 ng/dL (ref 0.80–1.80)

## 2015-01-18 NOTE — Progress Notes (Signed)
Stool cards mailed to pt.

## 2015-01-18 NOTE — Assessment & Plan Note (Signed)
The patient has already been worked up partially because of the multiple ED visits that he has recently had. He has recent CMP, CBC, UA, TSH which were non-revealing.   Additionally today, we will do an A1c, Free T3, T4, ESR, CRP, HIV, PSA.   Stool hemoccult cards to be given to the patient. Referral to GI for colonoscopy if weight loss continues even if cards negative.   CTS chest without contrast to follow up on 11/15 CTS which showed lung nodules today. Patient is a former smoker.   Come back in 2 weeks for reassessment.  

## 2015-01-18 NOTE — Patient Instructions (Signed)
Please take all your medications as directed.  Please come back for a visit in 2 weeks. Please use stool cards as directed and bring them back to the clinic.  We will do blood work today and will call you with the results.   Madilyn Fireman MD MPH 01/18/2015 3:44 PM [facility] [address] [place], [place] [address]. Ph: [phone] Hours: 8 am - 5 pm

## 2015-01-18 NOTE — Progress Notes (Signed)
CSW received return call from pt's therapist at [facility].  Pt abruptly stopping coming or contacting agency for services and had not returned any calls from therapist.  Pt's case has been closed.  CSW will contact pt and encourage pt to resume services at [facility] or refer to another agency.

## 2015-01-18 NOTE — Telephone Encounter (Signed)
Pt's mother calls and states she cannot get him into his chair to come to visit, he is afraid he will die, i spoke to him via speaker ph, he says he is going to die here if he comes to appt, he states he is weak and so tired. He is encouraged greatly to come to the appt so dr Ellwood Dense may try to find an answer for his fears. His mother states that she is going to get him here and wants him out of her house, can we find a place for him to go. Sending to dr Ellwood Dense and shanaG.

## 2015-01-18 NOTE — Telephone Encounter (Signed)
Pt is no longer linked with [facility].  CSW placed call to Mr. Lizer to encourage pt to return for outpatient services or if pt would like referral to another agency.  CSW placed called to pt.  CSW left message requesting return call. CSW provided contact hours and phone number.

## 2015-01-18 NOTE — Assessment & Plan Note (Signed)
Patient has paranoid thought about having a hidden illness which will lead to his death. These thoughts have consumed his life. He is visibly anxious and panicking at the mere mention of a CTscan chest for lung nodules. He says - I knew it, I have cancer. Reassurance provided and counseled the patient to take his psych meds - spent more than 15 minutes doing that.
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
## 2015-01-18 NOTE — Telephone Encounter (Signed)
Joshua Park would be covered for nursing/assisted living placement, he would need to be agreeable.  I am doubtful this would be able to occur by close of business today.

## 2015-01-18 NOTE — Progress Notes (Addendum)
CSW met with [name] and his mother, Joshua Park.  Joshua Park lives alone with CAP services in a 3 bedroom place.  Mother states she assists Joshua Park daily but it has been increasingly more difficulty because of pt's emotional status.  Joshua Park states he has not been taking his behavioral health medication, fearful that the medication will cause him to fall asleep and never wake up.  Pt states he discussed this with PCP and is in agreement to resume his medication.  Joshua Park is in agreement for placement to decrease his family's concern for him.   CSW attempted to contact pt's CAP caseworker to discuss respite, assisted living or family care home placement and what CAP services may follow.  Message left for pt's CAP Caseworker and [facility] counselor.  Joshua Park states he does not want to be alone and aware he would have a room mate at assisted living.   CSW initiated FL-2 and provided to PCP.  Provided pt's mother with listing of adult care homes, family care homes and state of [place] star ratings.   Mother to contact CAP caseworker to determine is respite is an option.  If not, mother/pt to decide on family care home vs adult care home and which facility they would like information sent.  Pt/mother and caregiver left for home to eat lunch and aware of appointment this afternoon back at [facility].  Joshua Park, mother, has CSW contact information.

## 2015-01-18 NOTE — Telephone Encounter (Signed)
Discussed this with Mr Arrants today, and he and his mother will look into options. I understand both of theirs difficulties - a son who is paraplegic with possible psychiatric issues now, and a mother who is aging. However, they would be willing to discuss this and contact the clinic back. I thank Edwena Blow for her work on this.

## 2015-01-19 ENCOUNTER — Other Ambulatory Visit: Payer: Self-pay | Admitting: Internal Medicine

## 2015-01-19 ENCOUNTER — Telehealth: Payer: Self-pay | Admitting: Internal Medicine

## 2015-01-19 LAB — HIV ANTIBODY (ROUTINE TESTING W REFLEX): HIV: NONREACTIVE

## 2015-01-19 LAB — SEDIMENTATION RATE: Sed Rate: 1 mm/hr (ref 0–15)

## 2015-01-19 LAB — PSA: PSA: 1.27 ng/mL (ref <=4.00)

## 2015-01-19 NOTE — Telephone Encounter (Signed)
Call received from patient and his mother. Explained CT chest results and implications. Patient and mother understand. The patient continues to be anxious and panicky. Mother laments that she would not be able to take care of him if she continues to be like this. She will call Ms Golden Hurter and talk about facilities sometime next week. Both mom and son verbalized understanding of the CT results. Madilyn Fireman MD MPH 01/19/2015 3:27 PM

## 2015-01-22 ENCOUNTER — Telehealth: Payer: Self-pay | Admitting: Internal Medicine

## 2015-01-22 DIAGNOSIS — R1310 Dysphagia, unspecified: Secondary | ICD-10-CM

## 2015-01-22 NOTE — Telephone Encounter (Signed)
CSW received return call from pt's mother.  Mother requesting the phone number to [facility].  Ms. [name] still unsure if placement will be long term or short term.  Mother states "I'm not sure what he's going to do.  I'm just trying to find him a place."   Mother aware pt was receiving counseling and has attempted to encourage pt to return to counseling.  Family aware CSW is available to assist as needed.

## 2015-01-22 NOTE — Telephone Encounter (Signed)
Called mother, caregiver back, gave her a choice of appt or ED, she states he is ok now but she knows he is getting weaker and needs help, she states she is meeting w/ csw and HHN tomorrow for placement. She is reassured and ask to call 911 if needed and to call back to office if she needs someone to talk to, she is agreeable

## 2015-01-22 NOTE — Telephone Encounter (Signed)
Pt's HHN called, she states he is c/o swallowing difficulty, she has witnessed this and states she wonders if there is a possibility that he had a stroke in recent months, causing weakness, swallowing difficulties, mental distress, bladder problems, she is asking if possible scans of brain or a neuro consult are possible

## 2015-01-22 NOTE — Telephone Encounter (Signed)
Patients mom calling states that patient says he can not swallow.

## 2015-01-23 ENCOUNTER — Other Ambulatory Visit (HOSPITAL_COMMUNITY): Payer: Self-pay | Admitting: Internal Medicine

## 2015-01-23 DIAGNOSIS — R1310 Dysphagia, unspecified: Secondary | ICD-10-CM

## 2015-01-23 NOTE — Telephone Encounter (Signed)
Last visit, I asked Joshua Park several questions to characterize his dysphagia however he could not clarify on that. On repeated concerns voiced by him and his mother, I will order some imaging and ambulatory referral to GI for dysphagia and weight loss. He did not have any symptoms for stroke last visit. He probably has a change in his personality and this concerns me, however he has been seen by psychiatry. I underline, that he should start taking his psych medications for anxiety relief. If need be, we will certainly do a CT brain. If he feels he is choking, he should come to the ER. If his condition is not emergent and he wishes to be seen in clinic, we would be happy to do that.  Madilyn Fireman MD MPH 01/23/2015 10:21 AM

## 2015-01-25 ENCOUNTER — Ambulatory Visit (HOSPITAL_COMMUNITY): Admission: RE | Admit: 2015-01-25 | Payer: Medicaid Other | Source: Ambulatory Visit

## 2015-01-30 ENCOUNTER — Ambulatory Visit (HOSPITAL_COMMUNITY): Admission: RE | Admit: 2015-01-30 | Payer: Medicaid Other | Source: Ambulatory Visit

## 2015-01-30 ENCOUNTER — Ambulatory Visit (HOSPITAL_COMMUNITY): Payer: Medicaid Other

## 2015-01-30 ENCOUNTER — Ambulatory Visit (HOSPITAL_COMMUNITY)
Admission: RE | Admit: 2015-01-30 | Discharge: 2015-01-30 | Disposition: A | Discharge status: Home / Self Care | Payer: Medicaid Other | Source: Ambulatory Visit | Attending: Internal Medicine | Admitting: Internal Medicine

## 2015-01-30 NOTE — Telephone Encounter (Signed)
Mother states CAP caseworker and [facility] SW is assisting Joshua Park with placement.  Forms are being faxed to PCP today for completion.  Mother requesting forms to be completed and returned to expedite placement.  Continuing to explore placement at [facility] and [facility].

## 2015-01-31 ENCOUNTER — Telehealth: Payer: Self-pay | Admitting: Internal Medicine

## 2015-01-31 ENCOUNTER — Encounter: Payer: Self-pay | Admitting: Licensed Clinical Social Worker

## 2015-01-31 NOTE — Telephone Encounter (Signed)
FL2 form - Signed and given to you today.

## 2015-01-31 NOTE — Progress Notes (Signed)
Patient ID: Joshua Park, male   DOB: 01-10-1971, 44 y.o.   MRN: [id number] Joshua Park is working with [facility] SW for NF placement.  [facility] received FL-2 from [facility].  FL-2 completed/signed and faxed to [place].

## 2015-01-31 NOTE — Telephone Encounter (Signed)
Call to patient to confirm appointment for 02/01/15 at 2:15 lmtcb

## 2015-02-01 ENCOUNTER — Ambulatory Visit: Payer: Medicaid Other | Admitting: Internal Medicine

## 2015-02-01 ENCOUNTER — Telehealth: Payer: Self-pay | Admitting: Licensed Clinical Social Worker
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
## 2015-02-01 NOTE — Telephone Encounter (Signed)
I placed a call to the [facility] and was informed they were in the process of responding to this call.  They would not give me any further information.   I asked if our Doctor needed to do anything else at this time and was informed they would get in touch with [place] if needed.

## 2015-02-05 ENCOUNTER — Telehealth: Payer: Self-pay | Admitting: *Deleted

## 2015-02-05 NOTE — Telephone Encounter (Signed)
Talked with mother of pt again Bosie Helper [phone]  - talked with [place] - paperwork was sent to [facility] - informed mother to call [facility]. States  person was not available. Mother of pt sounded more relaxed in her voice  - she states she has been staying outside. Hilda Blades Shaasia Odle RN 02/05/15 4:40PM

## 2015-02-05 NOTE — Telephone Encounter (Signed)
Mother of pt called clinic - pt is driving  her crazy. Needs help. Suggest for mother to call [place] about paperwork for placement. If pt needs an appt for papperwork then we need to address getting pt an appt in clinic. Mother states she was not pleased with [facility] - may have to call 911. Will call clinic back after talking with [facility]. Hilda Blades Lorra Freeman RN 02/05/15 3:40PM

## 2015-02-06 ENCOUNTER — Telehealth: Payer: Self-pay | Admitting: Licensed Clinical Social Worker

## 2015-02-06 NOTE — Telephone Encounter (Signed)
CSW received call from Ms. [name], pt's mother.  Pt remains at mother's home.  Mother requesting information on status of Mr. Elonda Husky NF placement.  CSW placed call to [facility] stating they did not receive FL-2.  CSW placed call to Annamarie Dawley, [place] message left requesting return call.  Left message with CAP to obtain updated information regarding placement.  Attempted to contact Mr. Senn, phone picked up and no one said anything. At this time, CSW can not confirm if pt is in agreement with placement.  Call back to Mr. Elonda Husky.  Pt states he is still in agreement for placement "my mother is handling all of that".  Mr. Enis denies who is currently working on his placement other than his mother.  CSW inquired if pt is taking his medication, "No, I'm not.  I'm laying down right now, can you call me back." CSW placed call to Ms. [name], family member answered phone.  Family member states Mr. Mcmanaman is allowing CAP aide to provide care.   Awaiting return call from [facility] SW and/or CAP caseworker regarding placement status.

## 2015-02-07 ENCOUNTER — Encounter: Payer: Self-pay | Admitting: *Deleted

## 2015-02-07 NOTE — Telephone Encounter (Signed)
CSW received call from Joshua Park's CAP Caseworker.  Message left stating CAP under the impression placement has been secured by [facility] SW.  This worker has left message from [facility] SW, awaiting return call to confirm placement details.

## 2015-02-08 NOTE — Telephone Encounter (Signed)
Joshua Park has not shown up for his GI studies and referral, and not been reachable. He has missed his clinic appointment as well. If you could please get in touch with him, please ask him to come to clinic, and we can reschedule his studies and referral if he wants.  Madilyn Fireman MD MPH 02/08/2015 4:13 PM

## 2015-02-09 ENCOUNTER — Non-Acute Institutional Stay (SKILLED_NURSING_FACILITY): Payer: Medicaid Other | Admitting: Adult Health

## 2015-02-09 DIAGNOSIS — G839 Paralytic syndrome, unspecified: Secondary | ICD-10-CM

## 2015-02-09 DIAGNOSIS — F333 Major depressive disorder, recurrent, severe with psychotic symptoms: Secondary | ICD-10-CM

## 2015-02-09 DIAGNOSIS — Z789 Other specified health status: Secondary | ICD-10-CM

## 2015-02-09 DIAGNOSIS — G894 Chronic pain syndrome: Secondary | ICD-10-CM | POA: Diagnosis not present

## 2015-02-09 DIAGNOSIS — E43 Unspecified severe protein-calorie malnutrition: Secondary | ICD-10-CM | POA: Diagnosis not present

## 2015-02-09 DIAGNOSIS — N319 Neuromuscular dysfunction of bladder, unspecified: Secondary | ICD-10-CM

## 2015-02-12 ENCOUNTER — Other Ambulatory Visit (INDEPENDENT_AMBULATORY_CARE_PROVIDER_SITE_OTHER): Payer: Medicaid Other

## 2015-02-12 ENCOUNTER — Telehealth: Payer: Self-pay | Admitting: Licensed Clinical Social Worker

## 2015-02-12 DIAGNOSIS — Z1211 Encounter for screening for malignant neoplasm of colon: Secondary | ICD-10-CM | POA: Diagnosis not present

## 2015-02-12 LAB — POC HEMOCCULT BLD/STL (HOME/3-CARD/SCREEN)
FECAL OCCULT BLD: NEGATIVE
FECAL OCCULT BLD: NEGATIVE
Fecal Occult Blood, POC: NEGATIVE

## 2015-02-12 LAB — LIPID PANEL
Cholesterol: 181 mg/dL (ref 0–200)
HDL: 91 mg/dL — AB (ref 35–70)
LDL CALC: 73 mg/dL
Triglycerides: 84 mg/dL (ref 40–160)

## 2015-02-12 LAB — HEPATIC FUNCTION PANEL
ALK PHOS: 64 U/L (ref 25–125)
ALT: 15 U/L (ref 10–40)
AST: 12 U/L — AB (ref 14–40)

## 2015-02-12 LAB — BASIC METABOLIC PANEL
BUN: 10 mg/dL (ref 4–21)
CREATININE: 0.6 mg/dL (ref 0.6–1.3)
POTASSIUM: 4.2 mmol/L (ref 3.4–5.3)
Sodium: 138 mmol/L (ref 137–147)

## 2015-02-12 LAB — CBC AND DIFFERENTIAL
HEMATOCRIT: 39 % — AB (ref 41–53)
Hemoglobin: 13.3 g/dL — AB (ref 13.5–17.5)
Platelets: 205 10*3/uL (ref 150–399)
WBC: 3.7 10^3/mL

## 2015-02-12 LAB — TSH: TSH: 1.99 u[IU]/mL (ref 0.41–5.90)

## 2015-02-12 LAB — HEMOGLOBIN A1C: HEMOGLOBIN A1C: 5.6 % (ref 4.0–6.0)

## 2015-02-12 NOTE — Telephone Encounter (Signed)
CSW received call from Joshua Park previous mental health provider, Joshua Park.  Even though, pt's case has been closed by Joshua Park, pt's mother contacted therapist for assistance.  Joshua Park contacted this worker to obtain information regarding the appropriateness for a referral to a CST due to Joshua Park's multiple ED visits.  For continuity of care, CSW faxed ED visits and most recent office note to Joshua Park in hopes pt would qualify for CST.  Joshua Park aware of pending placement in facility.

## 2015-02-12 NOTE — Addendum Note (Signed)
Addended by: Orson Gear on: 02/12/2015 04:01 PM   Modules accepted: Orders

## 2015-02-13 ENCOUNTER — Encounter: Payer: Self-pay | Admitting: Internal Medicine

## 2015-02-13 ENCOUNTER — Non-Acute Institutional Stay (SKILLED_NURSING_FACILITY): Payer: Medicaid Other | Admitting: Internal Medicine

## 2015-02-13 DIAGNOSIS — R0602 Shortness of breath: Secondary | ICD-10-CM

## 2015-02-13 DIAGNOSIS — F329 Major depressive disorder, single episode, unspecified: Secondary | ICD-10-CM

## 2015-02-13 DIAGNOSIS — R339 Retention of urine, unspecified: Secondary | ICD-10-CM

## 2015-02-13 DIAGNOSIS — N319 Neuromuscular dysfunction of bladder, unspecified: Secondary | ICD-10-CM | POA: Diagnosis not present

## 2015-02-13 DIAGNOSIS — R300 Dysuria: Secondary | ICD-10-CM

## 2015-02-13 DIAGNOSIS — F418 Other specified anxiety disorders: Secondary | ICD-10-CM | POA: Diagnosis not present

## 2015-02-13 DIAGNOSIS — R634 Abnormal weight loss: Secondary | ICD-10-CM

## 2015-02-13 DIAGNOSIS — E538 Deficiency of other specified B group vitamins: Secondary | ICD-10-CM | POA: Diagnosis not present

## 2015-02-13 DIAGNOSIS — G8220 Paraplegia, unspecified: Secondary | ICD-10-CM

## 2015-02-13 DIAGNOSIS — R1310 Dysphagia, unspecified: Secondary | ICD-10-CM

## 2015-02-13 DIAGNOSIS — F419 Anxiety disorder, unspecified: Secondary | ICD-10-CM

## 2015-02-13 DIAGNOSIS — G894 Chronic pain syndrome: Secondary | ICD-10-CM | POA: Diagnosis not present

## 2015-02-13 DIAGNOSIS — R911 Solitary pulmonary nodule: Secondary | ICD-10-CM | POA: Diagnosis not present

## 2015-02-13 DIAGNOSIS — G8929 Other chronic pain: Secondary | ICD-10-CM | POA: Insufficient documentation

## 2015-02-13 DIAGNOSIS — F32A Depression, unspecified: Secondary | ICD-10-CM

## 2015-02-13 NOTE — Progress Notes (Signed)
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 Performed at [facility]   . Report Status 12/21/2014 12/25/2014 FINAL   Final  . Organism ID, Bacteria 12/21/2014 ENTEROCOCCUS SPECIES   Final  . Organism ID, Bacteria 12/21/2014 STAPHYLOCOCCUS SPECIES (COAGULASE NEGATIVE)   Final  . Color, Urine 12/21/2014 YELLOW  YELLOW Final  . APPearance 12/21/2014 CLEAR  CLEAR Final  . Specific Gravity, Urine 12/21/2014 1.019  1.005 - 1.030 Final  . pH 12/21/2014 6.0  5.0 - 8.0 Final  . Glucose, UA 12/21/2014 NEGATIVE  NEGATIVE mg/dL Final  . Hgb urine dipstick 12/21/2014 NEGATIVE  NEGATIVE Final  . Bilirubin Urine 12/21/2014 NEGATIVE  NEGATIVE Final  . Ketones, ur 12/21/2014 NEGATIVE  NEGATIVE mg/dL Final  . Protein, ur 12/21/2014 NEGATIVE  NEGATIVE  mg/dL Final  . Urobilinogen, UA 12/21/2014 0.2  0.0 - 1.0 mg/dL Final  . Nitrite 12/21/2014 NEGATIVE  NEGATIVE Final  . Leukocytes, UA 12/21/2014 MODERATE* NEGATIVE Final  . Squamous Epithelial / LPF 12/21/2014 RARE  RARE Final  . WBC, UA 12/21/2014 11-20  <3 WBC/hpf Final  . RBC / HPF 12/21/2014 0-2  <3 RBC/hpf Final  . Bacteria, UA 12/21/2014 FEW* RARE Final  . Crystals 12/21/2014 CA OXALATE CRYSTALS* NEGATIVE Final  . Urine-Other 12/21/2014 MUCOUS PRESENT   Final  Admission on 12/15/2014, Discharged on 12/15/2014  Component Date Value Ref Range Status  . WBC 12/15/2014 4.2  4.0 - 10.5 K/uL Final  . RBC 12/15/2014 4.61  4.22 - 5.81 MIL/uL Final  . Hemoglobin 12/15/2014 13.9  13.0 - 17.0 g/dL Final  . HCT 12/15/2014 41.3  39.0 - 52.0 % Final  . MCV 12/15/2014 89.6  78.0 - 100.0 fL Final  . MCH 12/15/2014 30.2  26.0 - 34.0 pg Final  . MCHC 12/15/2014 33.7  30.0 - 36.0 g/dL Final  . RDW 12/15/2014 12.9  11.5 - 15.5 % Final  . Platelets 12/15/2014 201  150 - 400 K/uL Final  . Neutrophils Relative % 12/15/2014 66  43 - 77 % Final  . Neutro Abs 12/15/2014 2.8  1.7 - 7.7 K/uL Final  . Lymphocytes Relative 12/15/2014 26  12 - 46 % Final  . Lymphs Abs 12/15/2014 1.1  0.7 - 4.0 K/uL Final  . Monocytes Relative 12/15/2014 7  3 - 12 % Final  . Monocytes Absolute 12/15/2014 0.3  0.1 - 1.0 K/uL Final  . Eosinophils Relative 12/15/2014 1  0 - 5 % Final  . Eosinophils Absolute 12/15/2014 0.0  0.0 - 0.7 K/uL Final  . Basophils Relative 12/15/2014 0  0 - 1 % Final  .  Basophils Absolute 12/15/2014 0.0  0.0 - 0.1 K/uL Final  . Sodium 12/15/2014 136  135 - 145 mmol/L Final  . Potassium 12/15/2014 3.8  3.5 - 5.1 mmol/L Final  . Chloride 12/15/2014 104  101 - 111 mmol/L Final  . CO2 12/15/2014 27  22 - 32 mmol/L Final  . Glucose, Bld 12/15/2014 133* 65 - 99 mg/dL Final  . BUN 12/15/2014 8  6 - 20 mg/dL Final  . Creatinine, Ser 12/15/2014 0.62  0.61 - 1.24 mg/dL Final  . Calcium 12/15/2014 9.3  8.9 -  10.3 mg/dL Final  . GFR calc non Af Amer 12/15/2014 >60  >60 mL/min Final  . GFR calc Af Amer 12/15/2014 >60  >60 mL/min Final   Comment: (NOTE) The eGFR has been calculated using the CKD EPI equation. This calculation has not been validated in all clinical situations. eGFR's persistently <60 mL/min signify possible Chronic Kidney Disease.   . Anion gap 12/15/2014 5  5 - 15 Final  . Color, Urine 12/15/2014 YELLOW  YELLOW Final  . APPearance 12/15/2014 CLOUDY* CLEAR Final  . Specific Gravity, Urine 12/15/2014 1.008  1.005 - 1.030 Final  . pH 12/15/2014 7.5  5.0 - 8.0 Final  . Glucose, UA 12/15/2014 NEGATIVE  NEGATIVE mg/dL Final  . Hgb urine dipstick 12/15/2014 NEGATIVE  NEGATIVE Final  . Bilirubin Urine 12/15/2014 NEGATIVE  NEGATIVE Final  . Ketones, ur 12/15/2014 NEGATIVE  NEGATIVE mg/dL Final  . Protein, ur 12/15/2014 NEGATIVE  NEGATIVE mg/dL Final  . Urobilinogen, UA 12/15/2014 0.2  0.0 - 1.0 mg/dL Final  . Nitrite 12/15/2014 NEGATIVE  NEGATIVE Final  . Leukocytes, UA 12/15/2014 SMALL* NEGATIVE Final  . Specimen Description 12/15/2014 URINE, CATHETERIZED   Final  . Special Requests 12/15/2014 NONE   Final  . Colony Count 12/15/2014    Final                   Value:NO GROWTH Performed at [facility]   . Culture 12/15/2014    Final                   Value:NO GROWTH Performed at [facility]   . Report Status 12/15/2014 12/16/2014 FINAL   Final  . Glucose-Capillary 12/15/2014 111* 65 - 99 mg/dL Final  . Squamous Epithelial / LPF 12/15/2014 FEW* RARE Final  . WBC, UA 12/15/2014 0-2  <3 WBC/hpf Final  . Urine-Other 12/15/2014 AMORPHOUS URATES/PHOSPHATES   Final  Office Visit on 12/13/2014  Component Date Value Ref Range Status  . TSH 12/13/2014 0.897  0.350 - 4.500 uIU/mL Final  Admission on 12/11/2014, Discharged on 12/11/2014  Component Date Value Ref Range Status  . WBC 12/11/2014 4.5  4.0 - 10.5 K/uL Final  . RBC 12/11/2014 4.86  4.22 - 5.81 MIL/uL Final    . Hemoglobin 12/11/2014 14.9  13.0 - 17.0 g/dL Final  . HCT 12/11/2014 43.0  39.0 - 52.0 % Final  . MCV 12/11/2014 88.5  78.0 - 100.0 fL Final  . MCH 12/11/2014 30.7  26.0 - 34.0 pg Final  . MCHC 12/11/2014 34.7  30.0 - 36.0 g/dL Final  . RDW 12/11/2014 12.9  11.5 - 15.5 % Final  . Platelets 12/11/2014 192  150 - 400 K/uL Final  . Sodium 12/11/2014 136  135 - 145 mmol/L Final  . Potassium 12/11/2014 3.8  3.5 - 5.1 mmol/L Final  . Chloride 12/11/2014 104  101 - 111 mmol/L Final  . CO2 12/11/2014 26  22 -  32 mmol/L Final  . Glucose, Bld 12/11/2014 92  65 - 99 mg/dL Final  . BUN 12/11/2014 10  6 - 20 mg/dL Final  . Creatinine, Ser 12/11/2014 0.76  0.61 - 1.24 mg/dL Final  . Calcium 12/11/2014 9.6  8.9 - 10.3 mg/dL Final  . GFR calc non Af Amer 12/11/2014 >60  >60 mL/min Final  . GFR calc Af Amer 12/11/2014 >60  >60 mL/min Final   Comment: (NOTE) The eGFR has been calculated using the CKD EPI equation. This calculation has not been validated in all clinical situations. eGFR's persistently <60 mL/min signify possible Chronic Kidney Disease.   . Anion gap 12/11/2014 6  5 - 15 Final  . B Natriuretic Peptide 12/11/2014 2.3  0.0 - 100.0 pg/mL Final  . Troponin i, poc 12/11/2014 0.00  0.00 - 0.08 ng/mL Final  . Comment 3 12/11/2014          Final   Comment: Due to the release kinetics of cTnI, a negative result within the first hours of the onset of symptoms does not rule out myocardial infarction with certainty. If myocardial infarction is still suspected, repeat the test at appropriate intervals.   . Color, Urine 12/11/2014 YELLOW  YELLOW Final  . APPearance 12/11/2014 CLOUDY* CLEAR Final  . Specific Gravity, Urine 12/11/2014 1.025  1.005 - 1.030 Final  . pH 12/11/2014 5.5  5.0 - 8.0 Final  . Glucose, UA 12/11/2014 NEGATIVE  NEGATIVE mg/dL Final  . Hgb urine dipstick 12/11/2014 NEGATIVE  NEGATIVE Final  . Bilirubin Urine 12/11/2014 SMALL* NEGATIVE Final  . Ketones, ur 12/11/2014  15* NEGATIVE mg/dL Final  . Protein, ur 12/11/2014 NEGATIVE  NEGATIVE mg/dL Final  . Urobilinogen, UA 12/11/2014 0.2  0.0 - 1.0 mg/dL Final  . Nitrite 12/11/2014 NEGATIVE  NEGATIVE Final  . Leukocytes, UA 12/11/2014 MODERATE* NEGATIVE Final  . Squamous Epithelial / LPF 12/11/2014 FEW* RARE Final  . WBC, UA 12/11/2014 3-6  <3 WBC/hpf Final  . RBC / HPF 12/11/2014 0-2  <3 RBC/hpf Final  . Bacteria, UA 12/11/2014 FEW* RARE Final  . Urine-Other 12/11/2014 MUCOUS PRESENT   Final  . Specimen Description 12/11/2014 URINE, RANDOM   Final  . Special Requests 12/11/2014 NONE   Final  . Colony Count 12/11/2014    Final                   Value:NO GROWTH Performed at [facility]   . Culture 12/11/2014    Final                   Value:NO GROWTH Performed at [facility]   . Report Status 12/11/2014 12/13/2014 FINAL   Final  Lab on 11/28/2014  Component Date Value Ref Range Status  . Amphetamine/Meth 11/28/2014 NEG  Cutoff:500 ng/mL Final  . Barbiturate Screen, Urine 11/28/2014 NEG  Cutoff:200 ng/mL Final  . Benzodiazepine Screen, Urine 11/28/2014 NEG  Cutoff:100 ng/mL Final  . Cannabinoid Scrn, Ur 11/28/2014 NEG  Cutoff:50 ng/mL Final  . Cocaine Metabolites 11/28/2014 NEG  Cutoff:150 ng/mL Final  . Methadone Screen, Urine 11/28/2014 NEG  Cutoff:300 ng/mL Final  . Opiate Screen, Urine 11/28/2014 NEG  Cutoff:100 ng/mL Final  . Oxycodone Screen, Ur 11/28/2014 PPS  Cutoff:100 ng/mL Final  . Propoxyphene 11/28/2014 NEG  Cutoff:300 ng/mL Final  . Buprenorphine, Urine 11/28/2014 NEG  Cutoff:10 ng/mL Final  . Carisoprodol, Urine 11/28/2014 NEG  Cutoff:100 ng/mL Final  . Fentanyl, Ur 11/28/2014 NEG  Cutoff:2 ng/mL Final  .  Meperidine, Ur 11/28/2014 NEG  Cutoff:200 ng/mL Final  . Tramadol Scrn, Ur 11/28/2014 NEG  Cutoff:200 ng/mL Final  . Zolpidem, Urine 11/28/2014 NEG  Cutoff:20 ng/mL Final  . Creatinine, Urine 11/28/2014 55.09  >20.0 mg/dL Final   Comment: * (PPS) Presumptive  positive screen result to be verified by         quantitative LC/MS or GC/MS confirmation testing.   . Color, Urine 11/28/2014 YELLOW  YELLOW Final  . APPearance 11/28/2014 CLEAR  CLEAR Final  . Specific Gravity, Urine 11/28/2014 1.007  1.005 - 1.030 Final  . pH 11/28/2014 6.0  5.0 - 8.0 Final  . Glucose, UA 11/28/2014 NEG  NEG mg/dL Final  . Bilirubin Urine 11/28/2014 NEG  NEG Final  . Ketones, ur 11/28/2014 NEG  NEG mg/dL Final  . Hgb urine dipstick 11/28/2014 NEG  NEG Final  . Protein, ur 11/28/2014 NEG  NEG mg/dL Final  . Urobilinogen, UA 11/28/2014 0.2  0.0 - 1.0 mg/dL Final  . Nitrite 11/28/2014 NEG  NEG Final  . Leukocytes, UA 11/28/2014 NEG  NEG Final  . Colony Count 11/28/2014 NO GROWTH   Final  . Organism ID, Bacteria 11/28/2014 NO GROWTH   Final  . Noroxycodone, Ur 11/28/2014 791* <50 ng/mL Final  . Oxycodone, ur 11/28/2014 461* <50 ng/mL Final  . Oxymorphone 11/28/2014 379* <50 ng/mL Final  There may be more visits with results that are not included.  Ct Chest Wo Contrast  01/18/2015   CLINICAL DATA:  Pulmonary nodules.  Unintentional weight loss.  EXAM: CT CHEST WITHOUT CONTRAST  TECHNIQUE: Multidetector CT imaging of the chest was performed following the standard protocol without IV contrast.  COMPARISON:  CT scan of June 02, 2014.  FINDINGS: No pneumothorax or pleural effusion is noted. Left lung is clear. The nodular density seen posteriorly in right upper lobe on prior exam appears to have gotten significantly smaller. However, there is a new cluster of nodule seen posteriorly in the right upper lobe more inferiorly, with the largest measuring 5.6 mm. Faint ground-glass opacity is seen in this area. No mediastinal mass or adenopathy is seen on these unenhanced images. IVC filter is seen in visualized portion of upper abdomen. No evidence of thoracic aortic aneurysm is seen on these unenhanced images. No significant osseus abnormality is noted in the chest.  IMPRESSION:  Nodular density seen posteriorly in right upper lobe on prior exam appears to have gotten significantly smaller. However, a new cluster of nodules is seen more inferiorly in the posterior portion of the right upper lobe, with the largest measuring 5.6 mm. There is faint ground-glass opacity around this. These may simply be inflammatory in origin, but follow-up unenhanced chest CT in 3 months is recommended to determine if persistent, in which case it would be concerning for possible neoplasm.   Electronically Signed   By: Marijo Conception, M.D.   On: 01/18/2015 16:12     Assessment/Plan   ICD-9-CM ICD-10-CM   1. Dysuria with possible urethral stricture 788.1 R30.0   2. Dysphagia  787.20 R13.10   3. Urinary retention due to #4 788.20 R33.9   4. Neurogenic bladder due to #5 596.54 N31.9   5. Paraplegia 344.1 G82.20   6. SOB (shortness of breath) 786.05 R06.02   7. Chronic pain syndrome - stable 338.4 G89.4   8. Anxiety and depression - uncontrolled 300.4 F41.8   9. Weight loss, unintentional - due to #8 783.21 R63.4   10. Solitary pulmonary nodule - with hx  tob abuse 793.11 R91.1   11.    B12 deficiency in light of paraplegia and depression  --refer to urology for dysuria with hx neurogenic bladder and urinary retention  --ST eval of dysphagia  --cont other meds as ordered  --pulm eval for nodule with remote hx tob abuse  --nutritional supplement as ordered  --refer to psych due to depression  --start Vit B12 1000 mcg daily  --GOAL: short term rehab with consideration for long term care. Communicated with pt and nursing.  --will follow  Tira Lafferty S. Perlie Gold  [facility] [address] [place], [place] [address] [phone] Cell (Monday-Friday 8 AM - 5 PM) [phone] After 5 PM and follow prompts
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
## 2015-02-14 ENCOUNTER — Encounter: Payer: Self-pay | Admitting: Cardiovascular Disease

## 2015-02-14 NOTE — Progress Notes (Signed)
This encounter was created in error - please disregard.

## 2015-02-16 ENCOUNTER — Encounter: Payer: Self-pay | Admitting: Cardiovascular Disease

## 2015-03-02 NOTE — Telephone Encounter (Signed)
Pt admitted to facility on 02/13/15.

## 2015-03-05 NOTE — Addendum Note (Signed)
Addended by: Hulan Fray on: 03/05/2015 08:39 PM   Modules accepted: Orders

## 2015-03-13 ENCOUNTER — Non-Acute Institutional Stay (SKILLED_NURSING_FACILITY): Payer: Medicaid Other | Admitting: Internal Medicine

## 2015-03-13 DIAGNOSIS — G894 Chronic pain syndrome: Secondary | ICD-10-CM

## 2015-03-13 DIAGNOSIS — F32A Depression, unspecified: Secondary | ICD-10-CM

## 2015-03-13 DIAGNOSIS — G8220 Paraplegia, unspecified: Secondary | ICD-10-CM | POA: Diagnosis not present

## 2015-03-13 DIAGNOSIS — E538 Deficiency of other specified B group vitamins: Secondary | ICD-10-CM

## 2015-03-13 DIAGNOSIS — R627 Adult failure to thrive: Secondary | ICD-10-CM

## 2015-03-13 DIAGNOSIS — F329 Major depressive disorder, single episode, unspecified: Secondary | ICD-10-CM

## 2015-03-13 DIAGNOSIS — N319 Neuromuscular dysfunction of bladder, unspecified: Secondary | ICD-10-CM

## 2015-03-13 DIAGNOSIS — R634 Abnormal weight loss: Secondary | ICD-10-CM | POA: Diagnosis not present

## 2015-03-13 DIAGNOSIS — F418 Other specified anxiety disorders: Secondary | ICD-10-CM | POA: Diagnosis not present

## 2015-03-13 DIAGNOSIS — F419 Anxiety disorder, unspecified: Principal | ICD-10-CM

## 2015-03-15 ENCOUNTER — Emergency Department (HOSPITAL_COMMUNITY): Payer: Medicaid Other

## 2015-03-15 ENCOUNTER — Encounter (HOSPITAL_COMMUNITY): Payer: Self-pay | Admitting: Emergency Medicine

## 2015-03-15 ENCOUNTER — Inpatient Hospital Stay (HOSPITAL_COMMUNITY)
Admission: EM | Admit: 2015-03-15 | Discharge: 2015-03-19 | DRG: 689 | Disposition: A | Discharge status: Skilled Nursing Facility | Payer: Medicaid Other | Attending: Internal Medicine | Admitting: Internal Medicine

## 2015-03-15 DIAGNOSIS — Z833 Family history of diabetes mellitus: Secondary | ICD-10-CM | POA: Diagnosis not present

## 2015-03-15 DIAGNOSIS — R1310 Dysphagia, unspecified: Secondary | ICD-10-CM | POA: Diagnosis present

## 2015-03-15 DIAGNOSIS — N390 Urinary tract infection, site not specified: Principal | ICD-10-CM | POA: Diagnosis present

## 2015-03-15 DIAGNOSIS — E43 Unspecified severe protein-calorie malnutrition: Secondary | ICD-10-CM | POA: Diagnosis present

## 2015-03-15 DIAGNOSIS — Z87440 Personal history of urinary (tract) infections: Secondary | ICD-10-CM | POA: Diagnosis not present

## 2015-03-15 DIAGNOSIS — F32A Depression, unspecified: Secondary | ICD-10-CM | POA: Diagnosis present

## 2015-03-15 DIAGNOSIS — Z8611 Personal history of tuberculosis: Secondary | ICD-10-CM

## 2015-03-15 DIAGNOSIS — B961 Klebsiella pneumoniae [K. pneumoniae] as the cause of diseases classified elsewhere: Secondary | ICD-10-CM | POA: Diagnosis present

## 2015-03-15 DIAGNOSIS — Z86718 Personal history of other venous thrombosis and embolism: Secondary | ICD-10-CM | POA: Diagnosis not present

## 2015-03-15 DIAGNOSIS — F29 Unspecified psychosis not due to a substance or known physiological condition: Secondary | ICD-10-CM | POA: Diagnosis present

## 2015-03-15 DIAGNOSIS — Z993 Dependence on wheelchair: Secondary | ICD-10-CM

## 2015-03-15 DIAGNOSIS — F329 Major depressive disorder, single episode, unspecified: Secondary | ICD-10-CM | POA: Diagnosis present

## 2015-03-15 DIAGNOSIS — F333 Major depressive disorder, recurrent, severe with psychotic symptoms: Secondary | ICD-10-CM | POA: Diagnosis not present

## 2015-03-15 DIAGNOSIS — G8320 Monoplegia of upper limb affecting unspecified side: Secondary | ICD-10-CM | POA: Diagnosis present

## 2015-03-15 DIAGNOSIS — Z87891 Personal history of nicotine dependence: Secondary | ICD-10-CM | POA: Diagnosis not present

## 2015-03-15 DIAGNOSIS — R300 Dysuria: Secondary | ICD-10-CM | POA: Diagnosis not present

## 2015-03-15 DIAGNOSIS — E876 Hypokalemia: Secondary | ICD-10-CM | POA: Diagnosis present

## 2015-03-15 DIAGNOSIS — R627 Adult failure to thrive: Secondary | ICD-10-CM | POA: Diagnosis present

## 2015-03-15 DIAGNOSIS — L89223 Pressure ulcer of left hip, stage 3: Secondary | ICD-10-CM | POA: Diagnosis present

## 2015-03-15 DIAGNOSIS — F332 Major depressive disorder, recurrent severe without psychotic features: Secondary | ICD-10-CM | POA: Diagnosis present

## 2015-03-15 DIAGNOSIS — L89899 Pressure ulcer of other site, unspecified stage: Secondary | ICD-10-CM | POA: Diagnosis present

## 2015-03-15 DIAGNOSIS — Z79891 Long term (current) use of opiate analgesic: Secondary | ICD-10-CM | POA: Diagnosis not present

## 2015-03-15 DIAGNOSIS — N319 Neuromuscular dysfunction of bladder, unspecified: Secondary | ICD-10-CM | POA: Diagnosis present

## 2015-03-15 DIAGNOSIS — N3000 Acute cystitis without hematuria: Secondary | ICD-10-CM | POA: Diagnosis not present

## 2015-03-15 DIAGNOSIS — G8929 Other chronic pain: Secondary | ICD-10-CM | POA: Diagnosis present

## 2015-03-15 DIAGNOSIS — L89159 Pressure ulcer of sacral region, unspecified stage: Secondary | ICD-10-CM | POA: Diagnosis present

## 2015-03-15 DIAGNOSIS — Z8249 Family history of ischemic heart disease and other diseases of the circulatory system: Secondary | ICD-10-CM | POA: Diagnosis not present

## 2015-03-15 DIAGNOSIS — F419 Anxiety disorder, unspecified: Secondary | ICD-10-CM | POA: Diagnosis present

## 2015-03-15 DIAGNOSIS — L8990 Pressure ulcer of unspecified site, unspecified stage: Secondary | ICD-10-CM | POA: Insufficient documentation

## 2015-03-15 DIAGNOSIS — Z79899 Other long term (current) drug therapy: Secondary | ICD-10-CM

## 2015-03-15 DIAGNOSIS — Z681 Body mass index (BMI) 19 or less, adult: Secondary | ICD-10-CM | POA: Diagnosis not present

## 2015-03-15 DIAGNOSIS — G8220 Paraplegia, unspecified: Secondary | ICD-10-CM | POA: Diagnosis present

## 2015-03-15 DIAGNOSIS — Z95828 Presence of other vascular implants and grafts: Secondary | ICD-10-CM

## 2015-03-15 DIAGNOSIS — K219 Gastro-esophageal reflux disease without esophagitis: Secondary | ICD-10-CM | POA: Diagnosis present

## 2015-03-15 DIAGNOSIS — F418 Other specified anxiety disorders: Secondary | ICD-10-CM | POA: Diagnosis not present

## 2015-03-15 LAB — URINALYSIS, ROUTINE W REFLEX MICROSCOPIC
BILIRUBIN URINE: NEGATIVE
GLUCOSE, UA: NEGATIVE mg/dL
KETONES UR: 15 mg/dL — AB
Nitrite: POSITIVE — AB
PROTEIN: 100 mg/dL — AB
Specific Gravity, Urine: 1.02 (ref 1.005–1.030)
UROBILINOGEN UA: 1 mg/dL (ref 0.0–1.0)
pH: 8 (ref 5.0–8.0)

## 2015-03-15 LAB — CBC WITH DIFFERENTIAL/PLATELET
Basophils Absolute: 0 10*3/uL (ref 0.0–0.1)
Basophils Relative: 0 % (ref 0–1)
EOS ABS: 0 10*3/uL (ref 0.0–0.7)
Eosinophils Relative: 0 % (ref 0–5)
HCT: 37.7 % — ABNORMAL LOW (ref 39.0–52.0)
HEMOGLOBIN: 12.4 g/dL — AB (ref 13.0–17.0)
LYMPHS ABS: 0.9 10*3/uL (ref 0.7–4.0)
Lymphocytes Relative: 9 % — ABNORMAL LOW (ref 12–46)
MCH: 30.2 pg (ref 26.0–34.0)
MCHC: 32.9 g/dL (ref 30.0–36.0)
MCV: 91.7 fL (ref 78.0–100.0)
MONOS PCT: 7 % (ref 3–12)
Monocytes Absolute: 0.7 10*3/uL (ref 0.1–1.0)
NEUTROS PCT: 84 % — AB (ref 43–77)
Neutro Abs: 8.1 10*3/uL — ABNORMAL HIGH (ref 1.7–7.7)
Platelets: 264 10*3/uL (ref 150–400)
RBC: 4.11 MIL/uL — ABNORMAL LOW (ref 4.22–5.81)
RDW: 13.9 % (ref 11.5–15.5)
WBC: 9.7 10*3/uL (ref 4.0–10.5)

## 2015-03-15 LAB — BASIC METABOLIC PANEL
Anion gap: 9 (ref 5–15)
BUN: 16 mg/dL (ref 6–20)
CHLORIDE: 99 mmol/L — AB (ref 101–111)
CO2: 29 mmol/L (ref 22–32)
CREATININE: 0.7 mg/dL (ref 0.61–1.24)
Calcium: 9.2 mg/dL (ref 8.9–10.3)
GFR calc Af Amer: >60 mL/min (ref >60)
GFR calc non Af Amer: >60 mL/min (ref >60)
GLUCOSE: 101 mg/dL — AB (ref 65–99)
Potassium: 3.4 mmol/L — ABNORMAL LOW (ref 3.5–5.1)
SODIUM: 137 mmol/L (ref 135–145)

## 2015-03-15 LAB — URINE MICROSCOPIC-ADD ON

## 2015-03-15 LAB — I-STAT CG4 LACTIC ACID, ED: Lactic Acid, Venous: 1.48 mmol/L (ref 0.5–2.0)

## 2015-03-15 MED ORDER — VANCOMYCIN HCL 500 MG IV SOLR
500.0000 mg | Freq: Two times a day (BID) | INTRAVENOUS | Status: DC
  Administered 2015-03-15 – 2015-03-18 (×6): 500 mg via INTRAVENOUS
  Filled 2015-03-15 (×7): qty 500

## 2015-03-15 MED ORDER — DEXTROSE 5 % IV SOLN
1.0000 g | Freq: Once | INTRAVENOUS | Status: AC
  Administered 2015-03-15: 1 g via INTRAVENOUS
  Filled 2015-03-15: qty 10

## 2015-03-15 MED ORDER — ACETAMINOPHEN 500 MG PO TABS
1000.0000 mg | ORAL_TABLET | Freq: Once | ORAL | Status: AC
  Administered 2015-03-15: 1000 mg via ORAL
  Filled 2015-03-15: qty 2
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
## 2015-03-15 NOTE — ED Notes (Signed)
Nancy from TTS came and spoke to patient about placement.
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
## 2015-03-15 NOTE — BH Assessment (Addendum)
Reviewed ED notes prior to initiating assessment. Pt has hx of paraplegia, anxiety and depression. He is currently living at [facility], and has not been taking his medications, eating or caring for himself. In June pt was assessed by this Probation officer and was being cared for by his aunt. At that time he was very anxious and had stopped eating because he was worried he was being poisoned.   Requested copy of IVC be faxed to [phone] for review.   Requested pt be placed in a room for assessment.   Ramond Marrow RN will place note in EPIC when pt is roomed for assessment.   Assessment to commence shortly.    Lear Ng, [facility] Triage Specialist 03/15/2015 7:19 PM
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
## 2015-03-15 NOTE — ED Notes (Signed)
[facility] Pt is paralyzed from waist down with contractures and some movement to hands. Complaint from nursing home that pt is no longer eating or taking medications. Pt unable to go to [facility] d/t physical condition.

## 2015-03-15 NOTE — ED Notes (Signed)
IV was started when I came took over patient.  IV is running and flushed.

## 2015-03-15 NOTE — ED Notes (Signed)
Sitter case called to staffing.
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
## 2015-03-16 ENCOUNTER — Encounter: Payer: Self-pay | Admitting: Internal Medicine

## 2015-03-16 DIAGNOSIS — L8990 Pressure ulcer of unspecified site, unspecified stage: Secondary | ICD-10-CM | POA: Insufficient documentation

## 2015-03-16 DIAGNOSIS — E43 Unspecified severe protein-calorie malnutrition: Secondary | ICD-10-CM | POA: Insufficient documentation

## 2015-03-16 DIAGNOSIS — F333 Major depressive disorder, recurrent, severe with psychotic symptoms: Secondary | ICD-10-CM

## 2015-03-16 DIAGNOSIS — N3000 Acute cystitis without hematuria: Secondary | ICD-10-CM

## 2015-03-16 DIAGNOSIS — F418 Other specified anxiety disorders: Secondary | ICD-10-CM

## 2015-03-16 HISTORY — DX: Unspecified severe protein-calorie malnutrition: E43

## 2015-03-16 LAB — CBC
HEMATOCRIT: 35.2 % — AB (ref 39.0–52.0)
Hemoglobin: 11.5 g/dL — ABNORMAL LOW (ref 13.0–17.0)
MCH: 29.9 pg (ref 26.0–34.0)
MCHC: 32.7 g/dL (ref 30.0–36.0)
MCV: 91.4 fL (ref 78.0–100.0)
PLATELETS: 250 10*3/uL (ref 150–400)
RBC: 3.85 MIL/uL — ABNORMAL LOW (ref 4.22–5.81)
RDW: 13.8 % (ref 11.5–15.5)
WBC: 7.8 10*3/uL (ref 4.0–10.5)

## 2015-03-16 LAB — COMPREHENSIVE METABOLIC PANEL
ALBUMIN: 2.9 g/dL — AB (ref 3.5–5.0)
ALT: 16 U/L — ABNORMAL LOW (ref 17–63)
ANION GAP: 8 (ref 5–15)
AST: 19 U/L (ref 15–41)
Alkaline Phosphatase: 57 U/L (ref 38–126)
BILIRUBIN TOTAL: 0.5 mg/dL (ref 0.3–1.2)
BUN: 15 mg/dL (ref 6–20)
CHLORIDE: 103 mmol/L (ref 101–111)
CO2: 27 mmol/L (ref 22–32)
Calcium: 8.9 mg/dL (ref 8.9–10.3)
Creatinine, Ser: 0.5 mg/dL — ABNORMAL LOW (ref 0.61–1.24)
GFR calc Af Amer: >60 mL/min (ref >60)
GFR calc non Af Amer: >60 mL/min (ref >60)
GLUCOSE: 109 mg/dL — AB (ref 65–99)
POTASSIUM: 3.2 mmol/L — AB (ref 3.5–5.1)
SODIUM: 138 mmol/L (ref 135–145)
TOTAL PROTEIN: 7.2 g/dL (ref 6.5–8.1)

## 2015-03-16 LAB — MRSA PCR SCREENING: MRSA by PCR: NEGATIVE

## 2015-03-16 MED ORDER — ENOXAPARIN SODIUM 40 MG/0.4ML ~~LOC~~ SOLN
40.0000 mg | SUBCUTANEOUS | Status: DC
  Administered 2015-03-16 – 2015-03-19 (×4): 40 mg via SUBCUTANEOUS
  Filled 2015-03-16 (×4): qty 0.4

## 2015-03-16 MED ORDER — TRAZODONE HCL 100 MG PO TABS
100.0000 mg | ORAL_TABLET | Freq: Every day | ORAL | Status: DC
Start: 2015-03-16 — End: 2015-03-19
  Administered 2015-03-16 – 2015-03-19 (×3): 100 mg via ORAL
  Filled 2015-03-16 (×5): qty 1
  Filled 2015-03-16: qty 2

## 2015-03-16 MED ORDER — POTASSIUM CHLORIDE CRYS ER 20 MEQ PO TBCR
40.0000 meq | EXTENDED_RELEASE_TABLET | Freq: Once | ORAL | Status: AC
  Administered 2015-03-16: 40 meq via ORAL
  Filled 2015-03-16: qty 2

## 2015-03-16 MED ORDER — DEXTROSE 5 % IV SOLN
1.0000 g | INTRAVENOUS | Status: DC
  Administered 2015-03-16 – 2015-03-18 (×3): 1 g via INTRAVENOUS
  Filled 2015-03-16 (×4): qty 10

## 2015-03-16 MED ORDER — BENZTROPINE MESYLATE 0.5 MG PO TABS
0.5000 mg | ORAL_TABLET | Freq: Every day | ORAL | Status: DC
  Administered 2015-03-16 – 2015-03-19 (×4): 0.5 mg via ORAL
  Filled 2015-03-16 (×5): qty 1

## 2015-03-16 MED ORDER — VITAMIN C 500 MG PO TABS
500.0000 mg | ORAL_TABLET | Freq: Every day | ORAL | Status: DC
  Administered 2015-03-16 – 2015-03-19 (×4): 500 mg via ORAL
  Filled 2015-03-16 (×4): qty 1

## 2015-03-16 MED ORDER — ONDANSETRON HCL 4 MG PO TABS: 4.0000 mg | ORAL_TABLET | Freq: Four times a day (QID) | ORAL | Status: DC | PRN

## 2015-03-16 MED ORDER — DOCUSATE SODIUM 100 MG PO CAPS: 100.0000 mg | ORAL_CAPSULE | Freq: Every day | ORAL | Status: DC | PRN

## 2015-03-16 MED ORDER — OXYCODONE HCL 5 MG PO TABS
7.5000 mg | ORAL_TABLET | ORAL | Status: DC | PRN
  Administered 2015-03-16 – 2015-03-19 (×6): 7.5 mg via ORAL
  Filled 2015-03-16 (×6): qty 2

## 2015-03-16 MED ORDER — POTASSIUM CHLORIDE IN NACL 20-0.9 MEQ/L-% IV SOLN
INTRAVENOUS | Status: DC
  Administered 2015-03-16 (×2): via INTRAVENOUS
  Filled 2015-03-16 (×10): qty 1000

## 2015-03-16 MED ORDER — CALCIUM CARBONATE ANTACID 500 MG PO CHEW: 2.0000 | CHEWABLE_TABLET | Freq: Every day | ORAL | Status: DC | PRN

## 2015-03-16 MED ORDER — ONDANSETRON HCL 4 MG/2ML IJ SOLN
4.0000 mg | Freq: Four times a day (QID) | INTRAMUSCULAR | Status: DC | PRN
Start: 2015-03-16 — End: 2015-03-19

## 2015-03-16 MED ORDER — VITAMIN B-12 1000 MCG PO TABS
1000.0000 ug | ORAL_TABLET | Freq: Every day | ORAL | Status: DC
  Administered 2015-03-16 – 2015-03-19 (×4): 1000 ug via ORAL
  Filled 2015-03-16 (×4): qty 1

## 2015-03-16 MED ORDER — ENSURE ENLIVE PO LIQD
237.0000 mL | Freq: Two times a day (BID) | ORAL | Status: DC
  Administered 2015-03-19: 237 mL via ORAL

## 2015-03-16 MED ORDER — GABAPENTIN 100 MG PO CAPS
100.0000 mg | ORAL_CAPSULE | Freq: Two times a day (BID) | ORAL | Status: DC
  Administered 2015-03-16 – 2015-03-19 (×8): 100 mg via ORAL
  Filled 2015-03-16 (×9): qty 1

## 2015-03-16 MED ORDER — PRO-STAT SUGAR FREE PO LIQD
30.0000 mL | Freq: Two times a day (BID) | ORAL | Status: DC
  Administered 2015-03-16: 30 mL via ORAL
  Filled 2015-03-16 (×9): qty 30

## 2015-03-16 MED ORDER — SACCHAROMYCES BOULARDII 250 MG PO CAPS
250.0000 mg | ORAL_CAPSULE | Freq: Two times a day (BID) | ORAL | Status: DC
  Administered 2015-03-16 – 2015-03-19 (×8): 250 mg via ORAL
  Filled 2015-03-16 (×9): qty 1

## 2015-03-16 MED ORDER — RISPERIDONE 0.5 MG PO TBDP
0.5000 mg | ORAL_TABLET | Freq: Every day | ORAL | Status: DC
  Administered 2015-03-16 – 2015-03-19 (×4): 0.5 mg via ORAL
  Filled 2015-03-16 (×5): qty 1

## 2015-03-16 MED ORDER — COLLAGENASE 250 UNIT/GM EX OINT
1.0000 "application " | TOPICAL_OINTMENT | Freq: Every day | CUTANEOUS | Status: DC
  Administered 2015-03-16 – 2015-03-19 (×4): 1 via TOPICAL
  Filled 2015-03-16 (×2): qty 30

## 2015-03-16 MED ORDER — BACLOFEN 10 MG PO TABS
10.0000 mg | ORAL_TABLET | Freq: Two times a day (BID) | ORAL | Status: DC
  Administered 2015-03-16 – 2015-03-19 (×8): 10 mg via ORAL
  Filled 2015-03-16 (×9): qty 1

## 2015-03-16 MED ORDER — SERTRALINE HCL 100 MG PO TABS
100.0000 mg | ORAL_TABLET | Freq: Every day | ORAL | Status: DC
  Administered 2015-03-16 – 2015-03-19 (×3): 100 mg via ORAL
  Filled 2015-03-16 (×5): qty 1

## 2015-03-16 MED ORDER — CLONAZEPAM 0.125 MG PO TBDP
0.5000 mg | ORAL_TABLET | Freq: Two times a day (BID) | ORAL | Status: DC | PRN
  Administered 2015-03-16: 0.5 mg via ORAL
  Filled 2015-03-16 (×2): qty 4
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
## 2015-03-16 NOTE — Progress Notes (Signed)
Patient admitted to [facility]. Pt verbalizes disagreement that he stated he was suicidal or homicidal. Patient refused some meds. Pt ate applesauce, crackers, soup, and sprite. Pt anxious about leaking urine. Upon assessment, patient has a stage 3 pressure ulcer to his sacrum and an unstageable to his left buttock.
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
## 2015-03-16 NOTE — Consult Note (Signed)
WOC wound consult note Reason for Consult:Unstageable pressure injury to sacrum, left ischium, left trochanter all present on admission.  [facility] relays that these wounds developed 7-10 days ago. Wound type:Unstageable pressure injuries.  All present on admission.  Pressure Ulcer POA: Yes Measurement: Sacrum 3 cm x 3 cm 100% adherent slough to wound bed Left hip Stage III pressure injury 1.5 cm x 1 cm x 0.1 cm pink moist wound bed Left ischium 4 cm x 3.2 cm x 0.3 cm with 100% adherent slough to wound bed.  Wound bed: devitalized tissue. Will begin enzymatic debridement.  Wounds have just developed over the last week as patient has refused to eat and refused turning and repositioning, per treatment nurse at SNF.  Patient has refused bathing as well. He is on a pressure redistribution mattress at SNF. Drainage (amount, consistency, odor) Minimal serosanguinous drainage. Musty odor.   Periwound:Intact Dressing procedure/placement/frequency: Cleanse pressure injuries to sacrum, left hip and left ischium with NS and pat gently dry.  Apply santyl to wound bed.  Cover with NS moist gauze.  Cover with ABD pad and tape.  Change daily.  Will order a mattress replacement with low air loss feature.  Will not follow at this time.  Please re-consult if needed.  Domenic Moras RN BSN [place] Pager [phone]
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
## 2015-03-16 NOTE — Progress Notes (Signed)
Utilization review completed.  

## 2015-03-16 NOTE — Progress Notes (Signed)
[name] PROGRESS NOTE  KEVIN SPACE WUJ:[id number] DOB: October 08, 1970 DOA: 03/15/2015 PCP: Madilyn Fireman, MD  Assessment/Plan: Major depressive disorder Patient appears severely depressed. Denies suicidal ideation. Psych consult pending for further recommendation. Does not need involuntary commitment. Continue Zoloft, Risperdal and trazodone for now.  Poor by mouth intake with failure to thrive Appears to be secondary to depression. No signs or symptoms of dysphagia or associated GI symptoms. Patient encouraged on adequate by mouth intake. Nutrition consulted to help with adding supplements.  Paraplegia with neurogenic bladder and recurrent UTIs [place] history of UTI. Prior cultures growing enterococcus. Currently on IV Rocephin. Follow culture. Continue Neurontin and oxycodone for pain.  Sacral decubitus ulcer Development of new wound likely in the setting of poor nutrition and patient refusing to reposition. Seen by wound care nurse and recommends treating pressure injuries to sacrum left hip and left ischium with normal saline and gently dry. Apply santyl to wound bed and cover with normal saline moist gauze daily dressing change.  Hypokalemia Replenish  DVT prophylaxis: Subcutaneous Lovenox Diet: Regular   Code Status: Full code Family Communication: None at bedside Disposition Plan: Return to [facility] living tomorrow.   Consultants:  Psychiatry  Procedures:  None  Antibiotics:  IV Rocephin  HPI/Subjective: Patient seen and examined. Appears depressed and withdrawn. Denies any suicidal ideations.  Objective: Filed Vitals:   03/16/15 1416  BP: 134/88  Pulse: 88  Temp: 98.3 F (36.8 C)  Resp: 18    Intake/Output Summary (Last 24 hours) at 03/16/15 1542 Last data filed at 03/16/15 1250  Gross per 24 hour  Intake    600 ml  Output    650 ml  Net    -50 ml   Filed Weights   03/16/15 1456  Weight: 50.803 kg (112 lb)    Exam:   General:   Middle aged male in no acute distress, appears withdrawn and depressed  HEENT: No pallor, most oral mucosa, supple neck  Chest: Clear to auscultation bilaterally  CVS: Normal S1 and S2, no murmurs  GI: Soft, nondistended, nontender, bowel sounds present, 6 decubitus ulcer  Musculoskeletal: Paraplegic  CNS: Alert and oriented, depression, paraplegic    Data Reviewed: Basic Metabolic Panel:  Recent Labs Lab 03/15/15 1704 03/16/15 0510  NA 137 138  K 3.4* 3.2*  CL 99* 103  CO2 29 27  GLUCOSE 101* 109*  BUN 16 15  CREATININE 0.70 0.50*  CALCIUM 9.2 8.9   Liver Function Tests:  Recent Labs Lab 03/16/15 0510  AST 19  ALT 16*  ALKPHOS 57  BILITOT 0.5  PROT 7.2  ALBUMIN 2.9*   No results for input(s): LIPASE, AMYLASE in the last 168 hours. No results for input(s): AMMONIA in the last 168 hours. CBC:  Recent Labs Lab 03/15/15 1704 03/16/15 0510  WBC 9.7 7.8  NEUTROABS 8.1*  --   HGB 12.4* 11.5*  HCT 37.7* 35.2*  MCV 91.7 91.4  PLT 264 250   Cardiac Enzymes: No results for input(s): CKTOTAL, CKMB, CKMBINDEX, TROPONINI in the last 168 hours. BNP (last 3 results)  Recent Labs  09/04/14 0127 12/11/14 1202  BNP 9.5 2.3    ProBNP (last 3 results) No results for input(s): PROBNP in the last 8760 hours.  CBG: No results for input(s): GLUCAP in the last 168 hours.  Recent Results (from the past 240 hour(s))  MRSA PCR Screening     Status: None   Collection Time: 03/16/15 12:50 AM  Result Value Ref Range Status  MRSA by PCR NEGATIVE NEGATIVE Final    Comment:        The GeneXpert MRSA Assay (FDA approved for NASAL specimens only), is one component of a comprehensive MRSA colonization surveillance program. It is not intended to diagnose MRSA infection nor to guide or monitor treatment for MRSA infections.      Studies: Dg Chest 2 View  03/15/2015   CLINICAL DATA:  Fever  EXAM: CHEST  2 VIEW  COMPARISON:  Chest radiograph January 12, 2015; chest  CT January 18, 2015  FINDINGS: Lungs are clear. Heart size and pulmonary vascularity are normal. No adenopathy. Postoperative changes noted in the left neck region. No apparent bone lesions.  IMPRESSION: No edema or consolidation.   Electronically Signed   By: Lowella Grip III M.D.   On: 03/15/2015 17:46    Scheduled Meds: . baclofen  10 mg Oral BID  . benztropine  0.5 mg Oral QHS  . cefTRIAXone (ROCEPHIN)  IV  1 g Intravenous Q24H  . collagenase  1 application Topical Daily  . enoxaparin (LOVENOX) injection  40 mg Subcutaneous Q24H  . feeding supplement (PRO-STAT SUGAR FREE 64)  30 mL Oral BID  . gabapentin  100 mg Oral BID  . risperiDONE  0.5 mg Oral QHS  . saccharomyces boulardii  250 mg Oral BID  . sertraline  100 mg Oral QHS  . traZODone  100 mg Oral QHS  . vancomycin  500 mg Intravenous Q12H  . vitamin B-12  1,000 mcg Oral Daily  . vitamin C  500 mg Oral Daily   Continuous Infusions: . 0.9 % NaCl with KCl 20 mEq / L 125 mL/hr at 03/16/15 0944    Principal Problem:   MDD (major depressive disorder), recurrent, severe, with psychosis Active Problems:   Paraplegia   Neurogenic bladder   Recurrent UTI   Greenfield filter in place?   Anxiety and depression   UTI (lower urinary tract infection)   Hypokalemia   Pressure ulcer   Protein-calorie malnutrition, severe    Time spent:     Louellen Molder  [name] Pager [phone]. If 7PM-7AM, please contact night-coverage at [email or web address], password [facility] 03/16/2015, 3:42 PM  LOS: 1 day
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
## 2015-03-17 DIAGNOSIS — E43 Unspecified severe protein-calorie malnutrition: Secondary | ICD-10-CM

## 2015-03-17 LAB — CBC
HEMATOCRIT: 31.9 % — AB (ref 39.0–52.0)
HEMOGLOBIN: 10.7 g/dL — AB (ref 13.0–17.0)
MCH: 30.6 pg (ref 26.0–34.0)
MCHC: 33.5 g/dL (ref 30.0–36.0)
MCV: 91.1 fL (ref 78.0–100.0)
PLATELETS: 260 10*3/uL (ref 150–400)
RBC: 3.5 MIL/uL — AB (ref 4.22–5.81)
RDW: 13.7 % (ref 11.5–15.5)
WBC: 6.4 10*3/uL (ref 4.0–10.5)

## 2015-03-17 LAB — COMPREHENSIVE METABOLIC PANEL
ALK PHOS: 46 U/L (ref 38–126)
ALT: 15 U/L — AB (ref 17–63)
AST: 18 U/L (ref 15–41)
Albumin: 2.5 g/dL — ABNORMAL LOW (ref 3.5–5.0)
Anion gap: 6 (ref 5–15)
BILIRUBIN TOTAL: 0.5 mg/dL (ref 0.3–1.2)
BUN: 9 mg/dL (ref 6–20)
CALCIUM: 8.5 mg/dL — AB (ref 8.9–10.3)
CHLORIDE: 105 mmol/L (ref 101–111)
CO2: 27 mmol/L (ref 22–32)
CREATININE: 0.5 mg/dL — AB (ref 0.61–1.24)
GFR calc Af Amer: >60 mL/min (ref >60)
Glucose, Bld: 98 mg/dL (ref 65–99)
Potassium: 3.3 mmol/L — ABNORMAL LOW (ref 3.5–5.1)
Sodium: 138 mmol/L (ref 135–145)
Total Protein: 6.1 g/dL — ABNORMAL LOW (ref 6.5–8.1)

## 2015-03-17 MED ORDER — FLUCONAZOLE 100 MG PO TABS
100.0000 mg | ORAL_TABLET | Freq: Every day | ORAL | Status: DC
  Administered 2015-03-17 – 2015-03-19 (×3): 100 mg via ORAL
  Filled 2015-03-17 (×3): qty 1

## 2015-03-17 MED ORDER — MAGIC MOUTHWASH W/LIDOCAINE
5.0000 mL | Freq: Three times a day (TID) | ORAL | Status: DC
  Administered 2015-03-17 – 2015-03-19 (×2): 5 mL via ORAL
  Filled 2015-03-17 (×9): qty 5

## 2015-03-17 MED ORDER — POTASSIUM CHLORIDE CRYS ER 20 MEQ PO TBCR
40.0000 meq | EXTENDED_RELEASE_TABLET | Freq: Once | ORAL | Status: AC
  Administered 2015-03-17: 40 meq via ORAL
  Filled 2015-03-17: qty 2

## 2015-03-17 NOTE — Progress Notes (Signed)
[name] PROGRESS NOTE  Joshua Park KDX:[id number] DOB: 1970/10/12 DOA: 03/15/2015 PCP: Madilyn Fireman, MD  Assessment/Plan: Major depressive disorder Patient appears severely depressed. Denies suicidal ideation. Psych consult recommended to resume home medications. (Risperdal and Cogentin 0.5 mg daily at bedtime, trazodone 100 mg daily at bedtime and gabapentin 100 mg twice daily ). No safety concerns.   Poor by mouth intake with failure to thrive Appears to be secondary to depression. Patient does report some dysphagia and sensation of food stuck in his throat . No oral thrush. Will treat him with empiric fluconazole. Add magic magic mouthwash with lidocaine. Patient encouraged on adequate by mouth intake. Nutrition consult appreciated added supplement.  Paraplegia with neurogenic bladder and recurrent UTIs - history of UTI. Prior cultures growing enterococcus. Currently on IV Rocephin. Follow culture. Continue Neurontin and oxycodone for pain.  Sacral decubitus ulcer Development of new wound likely in the setting of poor nutrition and patient refusing to reposition. Seen by wound care nurse and recommends treating pressure injuries to sacrum left hip and left ischium with normal saline and gently dry. Apply santyl to wound bed and cover with normal saline moist gauze daily dressing change. -Continue empiric vancomycin for now.  Hypokalemia Replenish  DVT prophylaxis: Subcutaneous Lovenox Diet: Regular   Code Status: Full code Family Communication: None at bedside Disposition Plan: Return to [place] living tomorrow.   Consultants:  Psychiatry  Procedures:  None  Antibiotics:  IV Rocephin  HPI/Subjective: Patient seen and examined. Still appears  depressed and withdrawn. Reports dysphagia.  Objective: Filed Vitals:   03/17/15 0609  BP: 146/83  Pulse: 79  Temp: 99 F (37.2 C)  Resp: 18    Intake/Output Summary (Last 24 hours) at 03/17/15  1212 Last data filed at 03/17/15 0851  Gross per 24 hour  Intake    780 ml  Output    400 ml  Net    380 ml   Filed Weights   03/16/15 1456  Weight: 50.803 kg (112 lb)    Exam:   General:  Middle aged male in no acute distress,  withdrawn and depressed  HEENT:  most oral mucosa, no thrush, supple neck  Chest: Clear to auscultation bilaterally  CVS: Normal S1 and S2, no murmurs  GI: Soft, nondistended, nontender, bowel sounds present, sacral decubitus ulcer  CNS: depressed ,paraplegic    Data Reviewed: Basic Metabolic Panel:  Recent Labs Lab 03/15/15 1704 03/16/15 0510 03/17/15 0518  NA 137 138 138  K 3.4* 3.2* 3.3*  CL 99* 103 105  CO2 29 27 27   GLUCOSE 101* 109* 98  BUN 16 15 9   CREATININE 0.70 0.50* 0.50*  CALCIUM 9.2 8.9 8.5*   Liver Function Tests:  Recent Labs Lab 03/16/15 0510 03/17/15 0518  AST 19 18  ALT 16* 15*  ALKPHOS 57 46  BILITOT 0.5 0.5  PROT 7.2 6.1*  ALBUMIN 2.9* 2.5*   No results for input(s): LIPASE, AMYLASE in the last 168 hours. No results for input(s): AMMONIA in the last 168 hours. CBC:  Recent Labs Lab 03/15/15 1704 03/16/15 0510 03/17/15 0518  WBC 9.7 7.8 6.4  NEUTROABS 8.1*  --   --   HGB 12.4* 11.5* 10.7*  HCT 37.7* 35.2* 31.9*  MCV 91.7 91.4 91.1  PLT 264 250 260   Cardiac Enzymes: No results for input(s): CKTOTAL, CKMB, CKMBINDEX, TROPONINI in the last 168 hours. BNP (last 3 results)  Recent Labs  09/04/14 0127 12/11/14 1202  BNP 9.5 2.3  ProBNP (last 3 results) No results for input(s): PROBNP in the last 8760 hours.  CBG: No results for input(s): GLUCAP in the last 168 hours.  Recent Results (from the past 240 hour(s))  MRSA PCR Screening     Status: None   Collection Time: 03/16/15 12:50 AM  Result Value Ref Range Status   MRSA by PCR NEGATIVE NEGATIVE Final    Comment:        The GeneXpert MRSA Assay (FDA approved for NASAL specimens only), is one component of a comprehensive MRSA  colonization surveillance program. It is not intended to diagnose MRSA infection nor to guide or monitor treatment for MRSA infections.      Studies: Dg Chest 2 View  03/15/2015   CLINICAL DATA:  Fever  EXAM: CHEST  2 VIEW  COMPARISON:  Chest radiograph January 12, 2015; chest CT January 18, 2015  FINDINGS: Lungs are clear. Heart size and pulmonary vascularity are normal. No adenopathy. Postoperative changes noted in the left neck region. No apparent bone lesions.  IMPRESSION: No edema or consolidation.   Electronically Signed   By: Lowella Grip III M.D.   On: 03/15/2015 17:46    Scheduled Meds: . baclofen  10 mg Oral BID  . benztropine  0.5 mg Oral QHS  . cefTRIAXone (ROCEPHIN)  IV  1 g Intravenous Q24H  . collagenase  1 application Topical Daily  . enoxaparin (LOVENOX) injection  40 mg Subcutaneous Q24H  . feeding supplement (ENSURE ENLIVE)  237 mL Oral BID BM  . feeding supplement (PRO-STAT SUGAR FREE 64)  30 mL Oral BID  . fluconazole  100 mg Oral Daily  . gabapentin  100 mg Oral BID  . magic mouthwash w/lidocaine  5 mL Oral TID  . risperiDONE  0.5 mg Oral QHS  . saccharomyces boulardii  250 mg Oral BID  . sertraline  100 mg Oral QHS  . traZODone  100 mg Oral QHS  . vancomycin  500 mg Intravenous Q12H  . vitamin B-12  1,000 mcg Oral Daily  . vitamin C  500 mg Oral Daily   Continuous Infusions: . 0.9 % NaCl with KCl 20 mEq / L 125 mL/hr at 03/16/15 0944      Time spent: 25 minutes    Loren Sawaya, [place]  [name] Pager [phone]. If 7PM-7AM, please contact night-coverage at [email or web address], password [facility] 03/17/2015, 12:12 PM  LOS: 2 days

## 2015-03-17 NOTE — Progress Notes (Signed)
Scratch previous note of medications. Pt has taken all medications today. Has not eaten.

## 2015-03-17 NOTE — Progress Notes (Signed)
Pt continues to refuse medications, PO fluids, and food.

## 2015-03-17 NOTE — Clinical Social Work Note (Addendum)
CSW reviewed MD note dated 03/17/15 that reflected pt may be ready to discharge tomorrow  If pt is ready for discharge MD will need to rescind IVC   CSW called and spoke with Ebony Hail at [facility] to let her know that pt may be ready to discharge back to her facility tomorrow  Ebony Hail stated that she will be available tomorrow and to call her and fax d/c summary to [phone].  Pt is long term resident at [facility]  .Dede Query, LCSW [facility] Clinical Social Worker - Weekend Coverage cell #: [phone]

## 2015-03-18 LAB — COMPREHENSIVE METABOLIC PANEL
ALBUMIN: 2.7 g/dL — AB (ref 3.5–5.0)
ALT: 14 U/L — ABNORMAL LOW (ref 17–63)
ANION GAP: 10 (ref 5–15)
AST: 14 U/L — AB (ref 15–41)
Alkaline Phosphatase: 45 U/L (ref 38–126)
BUN: 10 mg/dL (ref 6–20)
CHLORIDE: 103 mmol/L (ref 101–111)
CO2: 25 mmol/L (ref 22–32)
Calcium: 9 mg/dL (ref 8.9–10.3)
Creatinine, Ser: 0.45 mg/dL — ABNORMAL LOW (ref 0.61–1.24)
GFR calc Af Amer: >60 mL/min (ref >60)
GFR calc non Af Amer: >60 mL/min (ref >60)
GLUCOSE: 81 mg/dL (ref 65–99)
POTASSIUM: 3.8 mmol/L (ref 3.5–5.1)
SODIUM: 138 mmol/L (ref 135–145)
Total Bilirubin: 0.8 mg/dL (ref 0.3–1.2)
Total Protein: 6.4 g/dL — ABNORMAL LOW (ref 6.5–8.1)

## 2015-03-18 LAB — VANCOMYCIN, TROUGH: Vancomycin Tr: 4 ug/mL — ABNORMAL LOW (ref 10.0–20.0)

## 2015-03-18 LAB — CBC
HEMATOCRIT: 34.2 % — AB (ref 39.0–52.0)
Hemoglobin: 11.4 g/dL — ABNORMAL LOW (ref 13.0–17.0)
MCH: 30.3 pg (ref 26.0–34.0)
MCHC: 33.3 g/dL (ref 30.0–36.0)
MCV: 91 fL (ref 78.0–100.0)
PLATELETS: 274 10*3/uL (ref 150–400)
RBC: 3.76 MIL/uL — ABNORMAL LOW (ref 4.22–5.81)
RDW: 13.4 % (ref 11.5–15.5)
WBC: 5.6 10*3/uL (ref 4.0–10.5)

## 2015-03-18 LAB — URINE CULTURE

## 2015-03-18 MED ORDER — VANCOMYCIN HCL 500 MG IV SOLR
500.0000 mg | Freq: Three times a day (TID) | INTRAVENOUS | Status: DC
  Administered 2015-03-18 – 2015-03-19 (×3): 500 mg via INTRAVENOUS
  Filled 2015-03-18 (×4): qty 500

## 2015-03-18 MED ORDER — MIRTAZAPINE 15 MG PO TABS
15.0000 mg | ORAL_TABLET | Freq: Every day | ORAL | Status: DC
  Administered 2015-03-19: 15 mg via ORAL
  Filled 2015-03-18 (×2): qty 1

## 2015-03-18 MED ORDER — VANCOMYCIN HCL 500 MG IV SOLR
500.0000 mg | INTRAVENOUS | Status: AC
Start: 2015-03-18 — End: 2015-03-18
  Administered 2015-03-18: 500 mg via INTRAVENOUS
  Filled 2015-03-18: qty 500
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
## 2015-03-19 DIAGNOSIS — F333 Major depressive disorder, recurrent, severe with psychotic symptoms: Secondary | ICD-10-CM

## 2015-03-19 DIAGNOSIS — E876 Hypokalemia: Secondary | ICD-10-CM

## 2015-03-19 MED ORDER — BENZTROPINE MESYLATE 0.5 MG PO TABS: 0.5000 mg | ORAL_TABLET | Freq: Every day | ORAL | Status: DC

## 2015-03-19 MED ORDER — CEPHALEXIN 500 MG PO CAPS
500.0000 mg | ORAL_CAPSULE | Freq: Three times a day (TID) | ORAL | Status: DC
  Filled 2015-03-19 (×3): qty 1

## 2015-03-19 MED ORDER — MAGIC MOUTHWASH W/LIDOCAINE: 5.0000 mL | Freq: Three times a day (TID) | ORAL | Status: DC

## 2015-03-19 MED ORDER — CLONAZEPAM 0.5 MG PO TABS: 0.2500 mg | ORAL_TABLET | Freq: Every day | ORAL | Status: DC | PRN

## 2015-03-19 MED ORDER — MIRTAZAPINE 15 MG PO TABS: 15.0000 mg | ORAL_TABLET | Freq: Every day | ORAL | Status: DC

## 2015-03-19 MED ORDER — AMOXICILLIN-POT CLAVULANATE 875-125 MG PO TABS: 1.0000 | ORAL_TABLET | Freq: Two times a day (BID) | ORAL | Status: DC

## 2015-03-19 MED ORDER — OXYCODONE HCL 15 MG PO TABS: 7.5000 mg | ORAL_TABLET | ORAL | Status: DC | PRN

## 2015-03-19 MED ORDER — RISPERIDONE 0.5 MG PO TBDP: 0.5000 mg | ORAL_TABLET | Freq: Every day | ORAL | Status: DC

## 2015-03-19 MED ORDER — CEPHALEXIN 500 MG PO CAPS: 500.0000 mg | ORAL_CAPSULE | Freq: Three times a day (TID) | ORAL | Status: DC

## 2015-03-19 MED ORDER — FLUCONAZOLE 100 MG PO TABS: 100.0000 mg | ORAL_TABLET | Freq: Every day | ORAL | Status: DC

## 2015-03-19 MED ORDER — AMOXICILLIN-POT CLAVULANATE 875-125 MG PO TABS
1.0000 | ORAL_TABLET | Freq: Two times a day (BID) | ORAL | Status: DC
  Administered 2015-03-19: 1 via ORAL
  Filled 2015-03-19 (×2): qty 1

## 2015-03-19 MED ORDER — BACLOFEN 10 MG PO TABS: 10.0000 mg | ORAL_TABLET | Freq: Two times a day (BID) | ORAL | Status: DC

## 2015-03-19 NOTE — Progress Notes (Signed)
Discharge instructions accompanied pt, Left the unit in stable condition, transported via ambulance to [facility].

## 2015-03-19 NOTE — Progress Notes (Signed)
RN called report to Joshua Baltimore, LPN at [facility] facility. Pt aware and agreeable to discharge. Awaiting ambulance transportation.

## 2015-03-19 NOTE — Discharge Summary (Signed)
Physician Discharge Summary  Joshua Park LKT:[id number] DOB: 1971/01/26 DOA: 03/15/2015  PCP: Madilyn Fireman, MD  Admit date: 03/15/2015 Discharge date: 03/19/2015  Time spent: 35 minutes  Recommendations for Outpatient Follow-up:  Needs to follow up with psych outpatient.  Needs to finished treatment of UTI.  Needs wound care.   Discharge Diagnoses:    UTI (lower urinary tract infection)   MDD (major depressive disorder), recurrent, severe, with psychosis   Paraplegia   Neurogenic bladder   Recurrent UTI   [place] filter in place?   Anxiety and depression   Hypokalemia   Pressure ulcer   Protein-calorie malnutrition, severe   Discharge Condition: Stable  Diet recommendation: regular.   Filed Weights   03/16/15 1456  Weight: 50.803 kg (112 lb)    History of present illness:  Joshua Park is a 44 y.o. male with with past medical history of paraplegia, neurogenic bladder, recurrent UTIs, sacral decubitus ulcer, DVT, anxiety, depression who was sent from one of the local nursing home facilities due to the patient refusing to eat, self-care and to take medications. Apparently the patient has been having suicidal thoughts, but he will not offer further information on this matter. While being worked up in the emergency department he was noticed that the patient was febrile, septic and workup reveals a significantly abnormal urine analysis. He denies chest pain, productive cough, dyspnea, but complains of mild abdominal pain with mild nausea. He denies emesis.  He is currently in no acute distress, but seems to be withdrawn.   Hospital Course:  Major depressive disorder Patient appears severely depressed. Denies suicidal ideation. Psych consult recommended to resume home medications. (Risperdal and Cogentin 0.5 mg daily at bedtime, trazodone 100 mg daily at bedtime and gabapentin 100 mg twice daily ). No safety concerns. Patient is eating and taking medications.   Poor  by mouth intake with failure to thrive Appears to be secondary to depression. Patient does report some dysphagia and sensation of food stuck in his throat . Started empiric fluconazole and symptoms slightly improved. Added magic magic mouthwash with lidocaine. Patient encouraged on adequate by mouth intake. Nutrition consult appreciated added supplement. He is eating more, ate breakfast, and subway.   Paraplegia with neurogenic bladder and recurrent UTIs - history of UTI. Prior cultures growing enterococcus. Currently on IV Rocephin. cx growing klebsiella which is sensitive. Continue Neurontin and oxycodone for pain. He will be discharge on, Augmentin  for 5 more days.   Sacral decubitus ulcer Development of new wound likely in the setting of poor nutrition and patient refusing to reposition. Seen by wound care nurse and recommends treating pressure injuries to sacrum left hip and left ischium with normal saline and gently dry. Apply santyl to wound bed and cover with normal saline moist gauze daily dressing change. -Continue empiric vancomycin for now. Can transition to Augmentin upon discharge.  Hypokalemia Replenish  DVT prophylaxis: Subcutaneous Lovenox Diet: Regular  Procedures:  none  Consultations:  Psych  Discharge Exam: Filed Vitals:   03/19/15 0641  BP: 117/72  Pulse: 83  Temp: 98.1 F (36.7 C)  Resp: 18    General: Alert in no distress Cardiovascular: S 1, S 2 RRR Respiratory: CTA  Discharge Instructions   Discharge Instructions    Diet - low sodium heart healthy    Complete by:  As directed      Increase activity slowly    Complete by:  As directed  Current Discharge Medication List    START taking these medications   Details  amoxicillin-clavulanate (AUGMENTIN) 875-125 MG per tablet Take 1 tablet by mouth every 12 (twelve) hours. Qty: 10 tablet, Refills: 0    fluconazole (DIFLUCAN) 100 MG tablet Take 1 tablet (100 mg total) by mouth  daily. Qty: 10 tablet, Refills: 0    magic mouthwash w/lidocaine SOLN Take 5 mLs by mouth 3 (three) times daily. Qty: 100 mL, Refills: 0      CONTINUE these medications which have CHANGED   Details  !! baclofen (LIORESAL) 10 MG tablet Take 1 tablet (10 mg total) by mouth 2 (two) times daily. Qty: 90 tablet, Refills: 3   Associated Diagnoses: Chronic pain    benztropine (COGENTIN) 0.5 MG tablet Take 1 tablet (0.5 mg total) by mouth at bedtime. Qty: 30 tablet, Refills: 0    clonazePAM (KLONOPIN) 0.5 MG tablet Take 0.5 tablets (0.25 mg total) by mouth daily as needed for anxiety. Qty: 30 tablet, Refills: 0    oxyCODONE (ROXICODONE) 15 MG immediate release tablet Take 0.5 tablets (7.5 mg total) by mouth every 4 (four) hours as needed for pain. Qty: 75 tablet, Refills: 0    risperiDONE (RISPERDAL M-TABS) 0.5 MG disintegrating tablet Take 1 tablet (0.5 mg total) by mouth at bedtime. Qty: 60 tablet, Refills: 0     !! - Potential duplicate medications found. Please discuss with provider.    CONTINUE these medications which have NOT CHANGED   Details  Amino Acids-Protein Hydrolys (FEEDING SUPPLEMENT, PRO-STAT SUGAR FREE 64,) LIQD Take 30 mLs by mouth 2 (two) times daily.    !! baclofen (LIORESAL) 20 MG tablet Take 20 mg by mouth daily.    calcium carbonate (TUMS - DOSED IN MG ELEMENTAL CALCIUM) 500 MG chewable tablet Chew 2 tablets by mouth daily as needed for indigestion or heartburn.    Calcium Carbonate-Vitamin D (TGT CALCIUM DIETARY SUPPLEMENT PO) Take 2 tablets by mouth 3 (three) times daily.    clonazePAM (KLONOPIN) 0.25 MG disintegrating tablet Take 0.25 mg by mouth daily as needed (anxiety).    gabapentin (NEURONTIN) 100 MG capsule Take 1 capsule (100 mg total) by mouth 2 (two) times daily. Qty: 60 capsule, Refills: 0    saccharomyces boulardii (FLORASTOR) 250 MG capsule Take 250 mg by mouth 2 (two) times daily.    sertraline (ZOLOFT) 100 MG tablet Take 100 mg by mouth at  bedtime.    traZODone (DESYREL) 100 MG tablet Take 1 tablet (100 mg total) by mouth at bedtime. Qty: 14 tablet, Refills: 0    vitamin B-12 (CYANOCOBALAMIN) 1000 MCG tablet Take 1,000 mcg by mouth daily.    vitamin C (ASCORBIC ACID) 500 MG tablet Take 500 mg by mouth daily.    ammonium lactate (LAC-HYDRIN) 12 % lotion Apply 1 application topically as needed. For dry skin. Qty: 400 g, Refills: 3    docusate sodium (COLACE) 100 MG capsule Take 1 capsule (100 mg total) by mouth daily as needed for mild constipation or moderate constipation. Qty: 30 capsule, Refills: 1    Triamcinolone Acetonide (TRIAMCINOLONE 0.1 % CREAM : EUCERIN) CREA Apply 1 application topically 3 (three) times daily as needed. Qty: 1 each, Refills: prn     !! - Potential duplicate medications found. Please discuss with provider.    STOP taking these medications     ENSURE (ENSURE)        No Known Allergies Follow-up Information    Follow up with Madilyn Fireman, MD In 1  week.   Specialty:  Internal Medicine   Contact information:   [address] [place] [address] [phone]        The results of significant diagnostics from this hospitalization (including imaging, microbiology, ancillary and laboratory) are listed below for reference.    Significant Diagnostic Studies: Dg Chest 2 View  03/15/2015   CLINICAL DATA:  Fever  EXAM: CHEST  2 VIEW  COMPARISON:  Chest radiograph January 12, 2015; chest CT January 18, 2015  FINDINGS: Lungs are clear. Heart size and pulmonary vascularity are normal. No adenopathy. Postoperative changes noted in the left neck region. No apparent bone lesions.  IMPRESSION: No edema or consolidation.   Electronically Signed   By: Lowella Grip III M.D.   On: 03/15/2015 17:46    Microbiology: Recent Results (from the past 240 hour(s))  Urine culture     Status: None   Collection Time: 03/15/15  5:16 PM  Result Value Ref Range Status   Specimen Description  URINE, CATHETERIZED  Final   Special Requests NONE  Final   Culture   Final    >=100,000 COLONIES/mL KLEBSIELLA PNEUMONIAE Performed at [facility]    Report Status 03/18/2015 FINAL  Final   Organism ID, Bacteria KLEBSIELLA PNEUMONIAE  Final      Susceptibility   Klebsiella pneumoniae - MIC*    AMPICILLIN >=32 RESISTANT Resistant     CEFAZOLIN <=4 SENSITIVE Sensitive     CEFTRIAXONE <=1 SENSITIVE Sensitive     CIPROFLOXACIN <=0.25 SENSITIVE Sensitive     GENTAMICIN >=16 RESISTANT Resistant     IMIPENEM <=0.25 SENSITIVE Sensitive     NITROFURANTOIN 64 INTERMEDIATE Intermediate     TRIMETH/SULFA >=320 RESISTANT Resistant     AMPICILLIN/SULBACTAM 4 SENSITIVE Sensitive     PIP/TAZO <=4 SENSITIVE Sensitive     * >=100,000 COLONIES/mL KLEBSIELLA PNEUMONIAE  MRSA PCR Screening     Status: None   Collection Time: 03/16/15 12:50 AM  Result Value Ref Range Status   MRSA by PCR NEGATIVE NEGATIVE Final    Comment:        The GeneXpert MRSA Assay (FDA approved for NASAL specimens only), is one component of a comprehensive MRSA colonization surveillance program. It is not intended to diagnose MRSA infection nor to guide or monitor treatment for MRSA infections.      Labs: Basic Metabolic Panel:  Recent Labs Lab 03/15/15 1704 03/16/15 0510 03/17/15 0518 03/18/15 0521  NA 137 138 138 138  K 3.4* 3.2* 3.3* 3.8  CL 99* 103 105 103  CO2 29 27 27 25   GLUCOSE 101* 109* 98 81  BUN 16 15 9 10   CREATININE 0.70 0.50* 0.50* 0.45*  CALCIUM 9.2 8.9 8.5* 9.0   Liver Function Tests:  Recent Labs Lab 03/16/15 0510 03/17/15 0518 03/18/15 0521  AST 19 18 14*  ALT 16* 15* 14*  ALKPHOS 57 46 45  BILITOT 0.5 0.5 0.8  PROT 7.2 6.1* 6.4*  ALBUMIN 2.9* 2.5* 2.7*   No results for input(s): LIPASE, AMYLASE in the last 168 hours. No results for input(s): AMMONIA in the last 168 hours. CBC:  Recent Labs Lab 03/15/15 1704 03/16/15 0510 03/17/15 0518 03/18/15 0521  WBC 9.7  7.8 6.4 5.6  NEUTROABS 8.1*  --   --   --   HGB 12.4* 11.5* 10.7* 11.4*  HCT 37.7* 35.2* 31.9* 34.2*  MCV 91.7 91.4 91.1 91.0  PLT 264 250 260 274   Cardiac Enzymes: No results for  input(s): CKTOTAL, CKMB, CKMBINDEX, TROPONINI in the last 168 hours. BNP: BNP (last 3 results)  Recent Labs  09/04/14 0127 12/11/14 1202  BNP 9.5 2.3    ProBNP (last 3 results) No results for input(s): PROBNP in the last 8760 hours.  CBG: No results for input(s): GLUCAP in the last 168 hours.     Signed:  Niel Hummer A  [name] 03/19/2015, 12:12 PM

## 2015-03-19 NOTE — Progress Notes (Signed)
Clinical Social Work  CSW faxed DC summary to [facility] who is agreeable to accept patient today. Patient aware of DC and reports no family needs to be contacted. DC packet prepared with DC summary, FL2, and hard scripts included. RN to call report. PTAR arranged.  CSW is signing off but available if needed.  [place], [place] [phone]

## 2015-03-23 ENCOUNTER — Non-Acute Institutional Stay (SKILLED_NURSING_FACILITY): Payer: Medicaid Other | Admitting: Adult Health

## 2015-03-23 DIAGNOSIS — N319 Neuromuscular dysfunction of bladder, unspecified: Secondary | ICD-10-CM | POA: Diagnosis not present

## 2015-03-23 DIAGNOSIS — L8990 Pressure ulcer of unspecified site, unspecified stage: Secondary | ICD-10-CM | POA: Diagnosis not present

## 2015-03-23 DIAGNOSIS — N390 Urinary tract infection, site not specified: Secondary | ICD-10-CM | POA: Diagnosis not present

## 2015-03-23 DIAGNOSIS — K219 Gastro-esophageal reflux disease without esophagitis: Secondary | ICD-10-CM

## 2015-03-23 DIAGNOSIS — G8929 Other chronic pain: Secondary | ICD-10-CM

## 2015-03-23 DIAGNOSIS — F333 Major depressive disorder, recurrent, severe with psychotic symptoms: Secondary | ICD-10-CM | POA: Diagnosis not present

## 2015-03-23 DIAGNOSIS — G839 Paralytic syndrome, unspecified: Secondary | ICD-10-CM | POA: Diagnosis not present

## 2015-03-27 ENCOUNTER — Non-Acute Institutional Stay (SKILLED_NURSING_FACILITY): Payer: Medicaid Other | Admitting: Internal Medicine

## 2015-03-27 DIAGNOSIS — N319 Neuromuscular dysfunction of bladder, unspecified: Secondary | ICD-10-CM | POA: Diagnosis not present

## 2015-03-27 DIAGNOSIS — N390 Urinary tract infection, site not specified: Secondary | ICD-10-CM

## 2015-03-27 DIAGNOSIS — G8220 Paraplegia, unspecified: Secondary | ICD-10-CM | POA: Diagnosis not present

## 2015-03-27 DIAGNOSIS — F333 Major depressive disorder, recurrent, severe with psychotic symptoms: Secondary | ICD-10-CM | POA: Diagnosis not present

## 2015-03-27 DIAGNOSIS — R627 Adult failure to thrive: Secondary | ICD-10-CM

## 2015-03-27 DIAGNOSIS — G894 Chronic pain syndrome: Secondary | ICD-10-CM | POA: Diagnosis not present

## 2015-04-12 ENCOUNTER — Encounter: Payer: Self-pay | Admitting: Internal Medicine

## 2015-04-24 ENCOUNTER — Non-Acute Institutional Stay (SKILLED_NURSING_FACILITY): Payer: Medicaid Other | Admitting: Adult Health

## 2015-04-24 DIAGNOSIS — K5901 Slow transit constipation: Secondary | ICD-10-CM

## 2015-04-24 DIAGNOSIS — Z789 Other specified health status: Secondary | ICD-10-CM

## 2015-04-24 DIAGNOSIS — N319 Neuromuscular dysfunction of bladder, unspecified: Secondary | ICD-10-CM

## 2015-04-24 DIAGNOSIS — K219 Gastro-esophageal reflux disease without esophagitis: Secondary | ICD-10-CM

## 2015-04-24 DIAGNOSIS — G839 Paralytic syndrome, unspecified: Secondary | ICD-10-CM

## 2015-04-24 DIAGNOSIS — G894 Chronic pain syndrome: Secondary | ICD-10-CM | POA: Diagnosis not present

## 2015-04-24 DIAGNOSIS — E43 Unspecified severe protein-calorie malnutrition: Secondary | ICD-10-CM | POA: Diagnosis not present

## 2015-04-24 DIAGNOSIS — F333 Major depressive disorder, recurrent, severe with psychotic symptoms: Secondary | ICD-10-CM

## 2015-04-24 DIAGNOSIS — L8990 Pressure ulcer of unspecified site, unspecified stage: Secondary | ICD-10-CM | POA: Diagnosis not present

## 2015-05-07 DIAGNOSIS — G839 Paralytic syndrome, unspecified: Secondary | ICD-10-CM | POA: Insufficient documentation

## 2015-05-13 ENCOUNTER — Encounter: Payer: Self-pay | Admitting: Adult Health
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
## 2015-05-21 ENCOUNTER — Encounter: Payer: Self-pay | Admitting: *Deleted

## 2015-05-28 ENCOUNTER — Non-Acute Institutional Stay (SKILLED_NURSING_FACILITY): Payer: Medicaid Other | Admitting: Adult Health

## 2015-05-28 DIAGNOSIS — F333 Major depressive disorder, recurrent, severe with psychotic symptoms: Secondary | ICD-10-CM | POA: Diagnosis not present

## 2015-05-28 DIAGNOSIS — G839 Paralytic syndrome, unspecified: Secondary | ICD-10-CM

## 2015-05-28 DIAGNOSIS — G894 Chronic pain syndrome: Secondary | ICD-10-CM

## 2015-05-28 DIAGNOSIS — L8990 Pressure ulcer of unspecified site, unspecified stage: Secondary | ICD-10-CM

## 2015-05-28 DIAGNOSIS — N319 Neuromuscular dysfunction of bladder, unspecified: Secondary | ICD-10-CM

## 2015-05-29 ENCOUNTER — Encounter: Payer: Self-pay | Admitting: Adult Health
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
## 2015-06-27 ENCOUNTER — Encounter (HOSPITAL_COMMUNITY): Payer: Self-pay | Admitting: Emergency Medicine

## 2015-06-27 ENCOUNTER — Emergency Department (HOSPITAL_COMMUNITY): Payer: Medicaid Other

## 2015-06-27 ENCOUNTER — Inpatient Hospital Stay (HOSPITAL_COMMUNITY)
Admission: EM | Admit: 2015-06-27 | Discharge: 2015-07-01 | DRG: 871 | Disposition: A | Discharge status: Skilled Nursing Facility | Payer: Medicaid Other | Attending: Internal Medicine | Admitting: Internal Medicine

## 2015-06-27 DIAGNOSIS — R319 Hematuria, unspecified: Secondary | ICD-10-CM | POA: Diagnosis present

## 2015-06-27 DIAGNOSIS — R0602 Shortness of breath: Secondary | ICD-10-CM

## 2015-06-27 DIAGNOSIS — Z87440 Personal history of urinary (tract) infections: Secondary | ICD-10-CM | POA: Diagnosis not present

## 2015-06-27 DIAGNOSIS — L89224 Pressure ulcer of left hip, stage 4: Secondary | ICD-10-CM | POA: Diagnosis present

## 2015-06-27 DIAGNOSIS — N179 Acute kidney failure, unspecified: Secondary | ICD-10-CM | POA: Diagnosis present

## 2015-06-27 DIAGNOSIS — G8220 Paraplegia, unspecified: Secondary | ICD-10-CM | POA: Diagnosis present

## 2015-06-27 DIAGNOSIS — N390 Urinary tract infection, site not specified: Secondary | ICD-10-CM | POA: Diagnosis not present

## 2015-06-27 DIAGNOSIS — F329 Major depressive disorder, single episode, unspecified: Secondary | ICD-10-CM | POA: Diagnosis present

## 2015-06-27 DIAGNOSIS — F419 Anxiety disorder, unspecified: Secondary | ICD-10-CM | POA: Diagnosis present

## 2015-06-27 DIAGNOSIS — E872 Acidosis: Secondary | ICD-10-CM

## 2015-06-27 DIAGNOSIS — A419 Sepsis, unspecified organism: Secondary | ICD-10-CM

## 2015-06-27 DIAGNOSIS — I959 Hypotension, unspecified: Secondary | ICD-10-CM | POA: Diagnosis not present

## 2015-06-27 DIAGNOSIS — K5641 Fecal impaction: Secondary | ICD-10-CM | POA: Diagnosis present

## 2015-06-27 DIAGNOSIS — Z960 Presence of urogenital implants: Secondary | ICD-10-CM

## 2015-06-27 DIAGNOSIS — A4150 Gram-negative sepsis, unspecified: Secondary | ICD-10-CM | POA: Diagnosis not present

## 2015-06-27 DIAGNOSIS — M24522 Contracture, left elbow: Secondary | ICD-10-CM | POA: Diagnosis present

## 2015-06-27 DIAGNOSIS — Z9289 Personal history of other medical treatment: Secondary | ICD-10-CM | POA: Diagnosis not present

## 2015-06-27 DIAGNOSIS — L97519 Non-pressure chronic ulcer of other part of right foot with unspecified severity: Secondary | ICD-10-CM | POA: Diagnosis present

## 2015-06-27 DIAGNOSIS — M24521 Contracture, right elbow: Secondary | ICD-10-CM | POA: Diagnosis present

## 2015-06-27 DIAGNOSIS — Z993 Dependence on wheelchair: Secondary | ICD-10-CM | POA: Diagnosis not present

## 2015-06-27 DIAGNOSIS — Z978 Presence of other specified devices: Secondary | ICD-10-CM

## 2015-06-27 DIAGNOSIS — N319 Neuromuscular dysfunction of bladder, unspecified: Secondary | ICD-10-CM | POA: Diagnosis present

## 2015-06-27 DIAGNOSIS — F418 Other specified anxiety disorders: Secondary | ICD-10-CM | POA: Diagnosis present

## 2015-06-27 DIAGNOSIS — Z87891 Personal history of nicotine dependence: Secondary | ICD-10-CM

## 2015-06-27 DIAGNOSIS — Z86718 Personal history of other venous thrombosis and embolism: Secondary | ICD-10-CM

## 2015-06-27 DIAGNOSIS — L8990 Pressure ulcer of unspecified site, unspecified stage: Secondary | ICD-10-CM

## 2015-06-27 DIAGNOSIS — E861 Hypovolemia: Secondary | ICD-10-CM | POA: Diagnosis present

## 2015-06-27 DIAGNOSIS — D72829 Elevated white blood cell count, unspecified: Secondary | ICD-10-CM

## 2015-06-27 DIAGNOSIS — R6521 Severe sepsis with septic shock: Secondary | ICD-10-CM | POA: Diagnosis present

## 2015-06-27 DIAGNOSIS — F32A Depression, unspecified: Secondary | ICD-10-CM | POA: Diagnosis present

## 2015-06-27 DIAGNOSIS — E876 Hypokalemia: Secondary | ICD-10-CM | POA: Diagnosis present

## 2015-06-27 DIAGNOSIS — Z8611 Personal history of tuberculosis: Secondary | ICD-10-CM

## 2015-06-27 DIAGNOSIS — R000 Tachycardia, unspecified: Secondary | ICD-10-CM

## 2015-06-27 DIAGNOSIS — K219 Gastro-esophageal reflux disease without esophagitis: Secondary | ICD-10-CM | POA: Diagnosis present

## 2015-06-27 DIAGNOSIS — D649 Anemia, unspecified: Secondary | ICD-10-CM | POA: Diagnosis present

## 2015-06-27 LAB — CBC WITH DIFFERENTIAL/PLATELET
Basophils Absolute: 0 10*3/uL (ref 0.0–0.1)
Basophils Relative: 0 %
EOS PCT: 0 %
Eosinophils Absolute: 0 10*3/uL (ref 0.0–0.7)
HEMATOCRIT: 38.6 % — AB (ref 39.0–52.0)
HEMOGLOBIN: 12.9 g/dL — AB (ref 13.0–17.0)
Lymphocytes Relative: 2 %
Lymphs Abs: 0.3 10*3/uL — ABNORMAL LOW (ref 0.7–4.0)
MCH: 30 pg (ref 26.0–34.0)
MCHC: 33.4 g/dL (ref 30.0–36.0)
MCV: 89.8 fL (ref 78.0–100.0)
MONOS PCT: 0 %
Monocytes Absolute: 0 10*3/uL — ABNORMAL LOW (ref 0.1–1.0)
NEUTROS PCT: 98 %
Neutro Abs: 13.7 10*3/uL — ABNORMAL HIGH (ref 1.7–7.7)
Platelets: 200 10*3/uL (ref 150–400)
RBC: 4.3 MIL/uL (ref 4.22–5.81)
RDW: 15 % (ref 11.5–15.5)
WBC MORPHOLOGY: INCREASED
WBC: 14 10*3/uL — AB (ref 4.0–10.5)

## 2015-06-27 LAB — COMPREHENSIVE METABOLIC PANEL
ALT: 25 U/L (ref 17–63)
AST: 40 U/L (ref 15–41)
Albumin: 3.4 g/dL — ABNORMAL LOW (ref 3.5–5.0)
Alkaline Phosphatase: 86 U/L (ref 38–126)
Anion gap: 15 (ref 5–15)
BUN: 16 mg/dL (ref 6–20)
CHLORIDE: 103 mmol/L (ref 101–111)
CO2: 23 mmol/L (ref 22–32)
Calcium: 9.6 mg/dL (ref 8.9–10.3)
Creatinine, Ser: 1.4 mg/dL — ABNORMAL HIGH (ref 0.61–1.24)
GFR calc Af Amer: >60 mL/min (ref >60)
GFR, EST NON AFRICAN AMERICAN: 60 mL/min — AB (ref >60)
Glucose, Bld: 115 mg/dL — ABNORMAL HIGH (ref 65–99)
POTASSIUM: 3.7 mmol/L (ref 3.5–5.1)
Sodium: 141 mmol/L (ref 135–145)
Total Bilirubin: 0.7 mg/dL (ref 0.3–1.2)
Total Protein: 7.6 g/dL (ref 6.5–8.1)

## 2015-06-27 LAB — I-STAT CG4 LACTIC ACID, ED
LACTIC ACID, VENOUS: 5.45 mmol/L — AB (ref 0.5–2.0)
LACTIC ACID, VENOUS: 2.29 mmol/L — AB (ref 0.5–2.0)
Lactic Acid, Venous: 7.5 mmol/L (ref 0.5–2.0)

## 2015-06-27 LAB — URINALYSIS, ROUTINE W REFLEX MICROSCOPIC
Bilirubin Urine: NEGATIVE
GLUCOSE, UA: NEGATIVE mg/dL
Ketones, ur: NEGATIVE mg/dL
Nitrite: NEGATIVE
PH: 8 (ref 5.0–8.0)
Protein, ur: >300 mg/dL — AB
Specific Gravity, Urine: 1.029 (ref 1.005–1.030)

## 2015-06-27 LAB — LACTIC ACID, PLASMA: LACTIC ACID, VENOUS: 1.2 mmol/L (ref 0.5–2.0)

## 2015-06-27 LAB — APTT: APTT: 38 s — AB (ref 24–37)

## 2015-06-27 LAB — URINE MICROSCOPIC-ADD ON

## 2015-06-27 LAB — PROTIME-INR
INR: 1.75 — ABNORMAL HIGH (ref 0.00–1.49)
Prothrombin Time: 20.4 seconds — ABNORMAL HIGH (ref 11.6–15.2)

## 2015-06-27 LAB — ABO/RH: ABO/RH(D): A POS

## 2015-06-27 LAB — TYPE AND SCREEN
ABO/RH(D): A POS
ANTIBODY SCREEN: NEGATIVE

## 2015-06-27 MED ORDER — DEXTROSE 5 % IV SOLN
2.0000 g | INTRAVENOUS | Status: DC
  Administered 2015-06-27: 2 g via INTRAVENOUS
  Filled 2015-06-27: qty 2

## 2015-06-27 MED ORDER — CALCIUM CARBONATE-VITAMIN D 500-200 MG-UNIT PO TABS
1.0000 | ORAL_TABLET | Freq: Three times a day (TID) | ORAL | Status: DC
  Administered 2015-06-29: 1 via ORAL
  Filled 2015-06-27 (×6): qty 1

## 2015-06-27 MED ORDER — CLONAZEPAM 0.5 MG PO TABS: 0.2500 mg | ORAL_TABLET | Freq: Two times a day (BID) | ORAL | Status: DC | PRN

## 2015-06-27 MED ORDER — ONDANSETRON HCL 4 MG PO TABS: 4.0000 mg | ORAL_TABLET | Freq: Four times a day (QID) | ORAL | Status: DC | PRN

## 2015-06-27 MED ORDER — GABAPENTIN 100 MG PO CAPS
100.0000 mg | ORAL_CAPSULE | Freq: Two times a day (BID) | ORAL | Status: DC
  Administered 2015-06-28 – 2015-07-01 (×6): 100 mg via ORAL
  Filled 2015-06-27 (×8): qty 1

## 2015-06-27 MED ORDER — SODIUM CHLORIDE 0.9 % IJ SOLN
3.0000 mL | Freq: Two times a day (BID) | INTRAMUSCULAR | Status: DC
  Administered 2015-06-28: 3 mL via INTRAVENOUS

## 2015-06-27 MED ORDER — RISPERIDONE 0.5 MG PO TBDP
0.5000 mg | ORAL_TABLET | Freq: Every day | ORAL | Status: DC
  Filled 2015-06-27 (×6): qty 1

## 2015-06-27 MED ORDER — VITAMIN C 500 MG PO TABS
500.0000 mg | ORAL_TABLET | Freq: Every day | ORAL | Status: DC
  Administered 2015-06-29: 500 mg via ORAL
  Filled 2015-06-27 (×3): qty 1

## 2015-06-27 MED ORDER — MORPHINE SULFATE (PF) 2 MG/ML IV SOLN: 1.0000 mg | INTRAVENOUS | Status: DC | PRN

## 2015-06-27 MED ORDER — VANCOMYCIN HCL IN DEXTROSE 1-5 GM/200ML-% IV SOLN
1000.0000 mg | Freq: Once | INTRAVENOUS | Status: AC
  Administered 2015-06-27: 1000 mg via INTRAVENOUS
  Filled 2015-06-27: qty 200

## 2015-06-27 MED ORDER — ENOXAPARIN SODIUM 40 MG/0.4ML ~~LOC~~ SOLN
40.0000 mg | SUBCUTANEOUS | Status: DC
Start: 2015-06-28 — End: 2015-07-01
  Administered 2015-06-28 – 2015-06-30 (×3): 40 mg via SUBCUTANEOUS
  Filled 2015-06-27 (×4): qty 0.4

## 2015-06-27 MED ORDER — IOHEXOL 300 MG/ML  SOLN
100.0000 mL | Freq: Once | INTRAMUSCULAR | Status: AC | PRN
  Administered 2015-06-27: 100 mL via INTRAVENOUS

## 2015-06-27 MED ORDER — CLONAZEPAM 0.25 MG PO TBDP: 0.2500 mg | ORAL_TABLET | Freq: Every day | ORAL | Status: DC | PRN

## 2015-06-27 MED ORDER — FLEET ENEMA 7-19 GM/118ML RE ENEM
1.0000 | ENEMA | Freq: Once | RECTAL | Status: DC | PRN
  Filled 2015-06-27: qty 1

## 2015-06-27 MED ORDER — ACETAMINOPHEN 325 MG PO TABS
650.0000 mg | ORAL_TABLET | Freq: Four times a day (QID) | ORAL | Status: DC | PRN
  Administered 2015-06-27 – 2015-06-30 (×5): 650 mg via ORAL
  Filled 2015-06-27 (×5): qty 2

## 2015-06-27 MED ORDER — SODIUM CHLORIDE 0.9 % IV SOLN: INTRAVENOUS | Status: DC

## 2015-06-27 MED ORDER — PIPERACILLIN-TAZOBACTAM 3.375 G IVPB
3.3750 g | Freq: Three times a day (TID) | INTRAVENOUS | Status: DC
  Administered 2015-06-28 – 2015-07-01 (×10): 3.375 g via INTRAVENOUS
  Filled 2015-06-27 (×14): qty 50

## 2015-06-27 MED ORDER — DOCUSATE SODIUM 100 MG PO CAPS
100.0000 mg | ORAL_CAPSULE | Freq: Every day | ORAL | Status: DC
  Administered 2015-06-29 – 2015-07-01 (×3): 100 mg via ORAL
  Filled 2015-06-27 (×4): qty 1

## 2015-06-27 MED ORDER — VITAMIN B-12 1000 MCG PO TABS
1000.0000 ug | ORAL_TABLET | Freq: Every day | ORAL | Status: DC
  Administered 2015-06-29: 1000 ug via ORAL
  Filled 2015-06-27 (×3): qty 1

## 2015-06-27 MED ORDER — ACETAMINOPHEN 650 MG RE SUPP: 650.0000 mg | Freq: Four times a day (QID) | RECTAL | Status: DC | PRN

## 2015-06-27 MED ORDER — SODIUM CHLORIDE 0.9 % IV SOLN
INTRAVENOUS | Status: DC
  Administered 2015-06-27: 23:00:00 via INTRAVENOUS

## 2015-06-27 MED ORDER — SODIUM CHLORIDE 0.9 % IV BOLUS (SEPSIS)
1000.0000 mL | Freq: Once | INTRAVENOUS | Status: AC
  Administered 2015-06-27: 1000 mL via INTRAVENOUS

## 2015-06-27 MED ORDER — SERTRALINE HCL 100 MG PO TABS
100.0000 mg | ORAL_TABLET | Freq: Every day | ORAL | Status: DC
  Filled 2015-06-27 (×3): qty 1

## 2015-06-27 MED ORDER — CALCIUM CARBONATE ANTACID 500 MG PO CHEW: 2.0000 | CHEWABLE_TABLET | Freq: Every day | ORAL | Status: DC | PRN

## 2015-06-27 MED ORDER — OXYCODONE HCL 5 MG PO TABS
7.5000 mg | ORAL_TABLET | ORAL | Status: DC | PRN
  Administered 2015-06-30 – 2015-07-01 (×3): 7.5 mg via ORAL
  Filled 2015-06-27 (×3): qty 2

## 2015-06-27 MED ORDER — PIPERACILLIN-TAZOBACTAM 3.375 G IVPB 30 MIN
3.3750 g | Freq: Once | INTRAVENOUS | Status: AC
  Administered 2015-06-27: 3.375 g via INTRAVENOUS
  Filled 2015-06-27: qty 50

## 2015-06-27 MED ORDER — VANCOMYCIN HCL IN DEXTROSE 750-5 MG/150ML-% IV SOLN: 750.0000 mg | INTRAVENOUS | Status: DC

## 2015-06-27 MED ORDER — TRAZODONE HCL 100 MG PO TABS
100.0000 mg | ORAL_TABLET | Freq: Every day | ORAL | Status: DC
  Filled 2015-06-27 (×2): qty 2
  Filled 2015-06-27: qty 1

## 2015-06-27 MED ORDER — ONDANSETRON HCL 4 MG/2ML IJ SOLN: 4.0000 mg | Freq: Four times a day (QID) | INTRAMUSCULAR | Status: DC | PRN

## 2015-06-27 MED ORDER — BACLOFEN 20 MG PO TABS
20.0000 mg | ORAL_TABLET | Freq: Every day | ORAL | Status: DC
  Administered 2015-06-30: 20 mg via ORAL
  Filled 2015-06-27 (×4): qty 1

## 2015-06-27 MED ORDER — BENZTROPINE MESYLATE 0.5 MG PO TABS
0.5000 mg | ORAL_TABLET | Freq: Every day | ORAL | Status: DC
  Filled 2015-06-27 (×5): qty 1

## 2015-06-27 MED ORDER — SODIUM CHLORIDE 0.9 % IV BOLUS (SEPSIS)
30.0000 mL/kg | Freq: Once | INTRAVENOUS | Status: AC
  Administered 2015-06-27: 1000 mL via INTRAVENOUS

## 2015-06-27 MED ORDER — VANCOMYCIN HCL IN DEXTROSE 750-5 MG/150ML-% IV SOLN
750.0000 mg | INTRAVENOUS | Status: DC
  Filled 2015-06-27: qty 150

## 2015-06-27 NOTE — ED Notes (Signed)
From [facility] via [facility], parapalegic with chronic foley (blood noted) and ulcer, is febrile, tachy and hypotensive, A/O X4
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
## 2015-06-27 NOTE — ED Notes (Signed)
The patient refused a repeat EKG and refused to lay still for EKG picture. Pt states "It is too cold."
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
## 2015-06-27 NOTE — ED Notes (Signed)
Pt still at radiology

## 2015-06-28 ENCOUNTER — Inpatient Hospital Stay (HOSPITAL_COMMUNITY): Payer: Medicaid Other

## 2015-06-28 LAB — CREATININE, URINE, RANDOM: Creatinine, Urine: 82.81 mg/dL

## 2015-06-28 LAB — BASIC METABOLIC PANEL
ANION GAP: 7 (ref 5–15)
BUN: 12 mg/dL (ref 6–20)
CHLORIDE: 106 mmol/L (ref 101–111)
CO2: 23 mmol/L (ref 22–32)
Calcium: 8.2 mg/dL — ABNORMAL LOW (ref 8.9–10.3)
Creatinine, Ser: 0.71 mg/dL (ref 0.61–1.24)
Glucose, Bld: 121 mg/dL — ABNORMAL HIGH (ref 65–99)
POTASSIUM: 3.2 mmol/L — AB (ref 3.5–5.1)
SODIUM: 136 mmol/L (ref 135–145)

## 2015-06-28 LAB — LACTIC ACID, PLASMA: Lactic Acid, Venous: 1.8 mmol/L (ref 0.5–2.0)

## 2015-06-28 LAB — SODIUM, URINE, RANDOM: Sodium, Ur: 104 mmol/L

## 2015-06-28 LAB — CBC
HEMATOCRIT: 32.9 % — AB (ref 39.0–52.0)
HEMOGLOBIN: 10.7 g/dL — AB (ref 13.0–17.0)
MCH: 28.9 pg (ref 26.0–34.0)
MCHC: 32.5 g/dL (ref 30.0–36.0)
MCV: 88.9 fL (ref 78.0–100.0)
Platelets: 161 10*3/uL (ref 150–400)
RBC: 3.7 MIL/uL — AB (ref 4.22–5.81)
RDW: 15.2 % (ref 11.5–15.5)
WBC: 11.9 10*3/uL — AB (ref 4.0–10.5)

## 2015-06-28 LAB — MRSA PCR SCREENING: MRSA by PCR: POSITIVE — AB

## 2015-06-28 LAB — TSH: TSH: 2.112 u[IU]/mL (ref 0.350–4.500)

## 2015-06-28 LAB — PROCALCITONIN: PROCALCITONIN: 54.57 ng/mL

## 2015-06-28 MED ORDER — VANCOMYCIN HCL IN DEXTROSE 750-5 MG/150ML-% IV SOLN
750.0000 mg | Freq: Three times a day (TID) | INTRAVENOUS | Status: DC
  Administered 2015-06-28 – 2015-06-29 (×4): 750 mg via INTRAVENOUS
  Filled 2015-06-28 (×5): qty 150

## 2015-06-28 MED ORDER — SODIUM CHLORIDE 0.9 % IV BOLUS (SEPSIS): 500.0000 mL | Freq: Once | INTRAVENOUS | Status: DC

## 2015-06-28 MED ORDER — CHLORHEXIDINE GLUCONATE CLOTH 2 % EX PADS
6.0000 | MEDICATED_PAD | Freq: Every day | CUTANEOUS | Status: DC
  Administered 2015-06-29 – 2015-06-30 (×2): 6 via TOPICAL

## 2015-06-28 MED ORDER — SODIUM CHLORIDE 0.9 % IV BOLUS (SEPSIS): 500.0000 mL | Freq: Three times a day (TID) | INTRAVENOUS | Status: DC | PRN

## 2015-06-28 MED ORDER — MUPIROCIN 2 % EX OINT
1.0000 "application " | TOPICAL_OINTMENT | Freq: Two times a day (BID) | CUTANEOUS | Status: DC
  Administered 2015-06-28 – 2015-06-30 (×4): 1 via NASAL
  Filled 2015-06-28 (×4): qty 22

## 2015-06-28 MED ORDER — SODIUM CHLORIDE 0.9 % IV BOLUS (SEPSIS)
1000.0000 mL | Freq: Once | INTRAVENOUS | Status: AC
  Administered 2015-06-28: 1000 mL via INTRAVENOUS
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
## 2015-06-28 NOTE — Progress Notes (Signed)
Utilization review completed.  

## 2015-06-28 NOTE — Progress Notes (Signed)
Patient is positive for MRSA in the nares, orders placed per protocol

## 2015-06-28 NOTE — Progress Notes (Signed)
Pharmacy Antibiotic Follow-up Note  Joshua Park is a 44 y.o. year-old male admitted on 06/27/2015.  The patient is currently on day 1 of vancomycin and Zosyn for urosepsis. He was admitted with an AKI (baseline SCr ~0.5) w/ SCr 1.4 > now improved to 0.71. WBC 14 > 11.9, LA 7.5 > 1.8, PCT 54.57. Remains febrile w/ Tmax 102.6.  Assessment/Plan: The dose of vancomycin 750 mg q24h will be adjusted to vancomycin 750 mg q8h based on renal function.  Temp (24hrs), Avg:101 F (38.3 C), Min:99.9 F (37.7 C), Max:102.6 F (39.2 C)   Recent Labs Lab 06/27/15 1220 06/28/15 0111  WBC 14.0* 11.9*    Recent Labs Lab 06/27/15 1220 06/28/15 0111  CREATININE 1.40* 0.71   Estimated Creatinine Clearance: 108.8 mL/min (by C-G formula based on Cr of 0.71).    No Known Allergies  Antimicrobials this admission: 12/21 cefepime >> x1 dose 12/21 vancomycin >>  12/21 Zosyn >>  Levels/dose changes this admission: 12/22: vanc 750 q24h changed to 750 q8h (pt had not received any doses yet besides loading dose of 1 g)  Microbiology results: 12/21 BCx x2: GNR 12/21 UCx: pending  12/21 Wound Cx: pending 12/21 MRSA PCR: positive  Plan - continue Zosyn 3.375 g q8h - change vancomycin to 750 mg q8h - trough at Css if therapy to continue - f/u clinical improvement, LOT, cultures and narrowing of abx, renal function   Thank you for allowing pharmacy to be a part of this patient's care.  Governor Specking, PharmD Clinical Pharmacy Resident Pager: [phone] 06/28/2015 8:41 AM

## 2015-06-28 NOTE — Consult Note (Addendum)
WOC wound consult note Pt familiar to [facility] team, refer to previous consult note in 9/16.  Wounds have improved since that time. Reason for Consult: Consult requested for left hip, sacrum, and left foot. Wound type: Sacrum with previous pressure injury; it is healed with pink dry scar tissue intact without further open wound, odor, or drainage. No topical treatment indicated at this time. Left outer foot with unstagable pressure injury; 3X1.5cm, 100% dry eschar without odor, drainage, or fluctuance.  Removed outer dry callous surrounding wound. 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  100% beefy red, mod amt pink drainage, no odor.  Bone palpable with swab. Pressure Ulcer POA: Yes Dressing procedure/placement/frequency: Pt declined offer of air mattress overlay for bed.  It is best practice to leave dry stable eschar intact to left foot.  Float heels to reduce pressure. Foam dressing to left foot and sacrum scar tissue to protect from further injury.  Aquacel to absorb drainage to left hip and provide antimicrobial benefits.    Discussed plan of care with patient and he verbalized understanding. Please re-consult if further assistance is needed.  Thank-you,  Julien Girt MSN, [place], [place], [place], [place]

## 2015-06-28 NOTE — Clinical Documentation Improvement (Signed)
Internal Medicine Critical Care  Please clarify if LE paraplegia and UE paresis with contractures equates to functional quadriplegia?   Function Quadriplegia  Functional Quadriparesis  Other  Clinically Undetermined  Supporting Information: -- Wheelchair bound -- Living in SNF  Please exercise your independent, professional judgment when responding. A specific answer is not anticipated or expected.  Thank You,  Ezekiel Ina RN [place] [phone]

## 2015-06-28 NOTE — Progress Notes (Signed)
Pt refusing to take most PO medications; states he only wants antibiotics and tylenol; pt reluctant to turn and states "nothing you can do is going to help me"; pt denies pain or discomfort currently: VS per flowsheet; will continue to closely monitor

## 2015-06-28 NOTE — Progress Notes (Signed)
Patient transferred from [facility] via bed. Patient alert and oriented, family at bedside. VSS see flowsheet. Temp101.3 Tylenol 650mg  given PO. Patient asked not to remove covers presently for body review, Will try to access skin issues when fever breaks. Paraplegic with chronic indwelling cath draining cloudy yellow urine. PIV with 0.9NS at 106ml via pump. Patient requesting water. Will continue to monitor closely.

## 2015-06-29 DIAGNOSIS — N179 Acute kidney failure, unspecified: Secondary | ICD-10-CM | POA: Insufficient documentation

## 2015-06-29 LAB — COMPREHENSIVE METABOLIC PANEL
ALBUMIN: 2.4 g/dL — AB (ref 3.5–5.0)
ALT: 23 U/L (ref 17–63)
ANION GAP: 7 (ref 5–15)
AST: 33 U/L (ref 15–41)
Alkaline Phosphatase: 79 U/L (ref 38–126)
BILIRUBIN TOTAL: 0.8 mg/dL (ref 0.3–1.2)
BUN: <5 mg/dL — ABNORMAL LOW (ref 6–20)
CO2: 23 mmol/L (ref 22–32)
Calcium: 8.1 mg/dL — ABNORMAL LOW (ref 8.9–10.3)
Chloride: 106 mmol/L (ref 101–111)
Creatinine, Ser: 0.57 mg/dL — ABNORMAL LOW (ref 0.61–1.24)
GFR calc non Af Amer: >60 mL/min (ref >60)
GLUCOSE: 98 mg/dL (ref 65–99)
POTASSIUM: 2.5 mmol/L — AB (ref 3.5–5.1)
SODIUM: 136 mmol/L (ref 135–145)
TOTAL PROTEIN: 6.6 g/dL (ref 6.5–8.1)

## 2015-06-29 LAB — CBC
HCT: 29.8 % — ABNORMAL LOW (ref 39.0–52.0)
Hemoglobin: 9.8 g/dL — ABNORMAL LOW (ref 13.0–17.0)
MCH: 29.1 pg (ref 26.0–34.0)
MCHC: 32.9 g/dL (ref 30.0–36.0)
MCV: 88.4 fL (ref 78.0–100.0)
Platelets: 136 10*3/uL — ABNORMAL LOW (ref 150–400)
RBC: 3.37 MIL/uL — ABNORMAL LOW (ref 4.22–5.81)
RDW: 15.3 % (ref 11.5–15.5)
WBC: 9.9 10*3/uL (ref 4.0–10.5)

## 2015-06-29 MED ORDER — SODIUM CHLORIDE 0.9 % IV BOLUS (SEPSIS): 500.0000 mL | Freq: Three times a day (TID) | INTRAVENOUS | Status: DC | PRN

## 2015-06-29 MED ORDER — MAGNESIUM SULFATE 2 GM/50ML IV SOLN
2.0000 g | Freq: Once | INTRAVENOUS | Status: AC
  Administered 2015-06-29: 2 g via INTRAVENOUS
  Filled 2015-06-29: qty 50

## 2015-06-29 MED ORDER — POTASSIUM CHLORIDE CRYS ER 20 MEQ PO TBCR: 40.0000 meq | EXTENDED_RELEASE_TABLET | Freq: Three times a day (TID) | ORAL | Status: AC

## 2015-06-29 MED ORDER — POTASSIUM CHLORIDE 10 MEQ/100ML IV SOLN
10.0000 meq | INTRAVENOUS | Status: AC
  Administered 2015-06-29 (×5): 10 meq via INTRAVENOUS
  Filled 2015-06-29 (×5): qty 100

## 2015-06-29 MED ORDER — SODIUM CHLORIDE 0.9 % IV SOLN
INTRAVENOUS | Status: DC
  Administered 2015-06-29 – 2015-06-30 (×2): via INTRAVENOUS
  Filled 2015-06-29 (×3): qty 1000

## 2015-06-29 MED ORDER — LEVALBUTEROL HCL 1.25 MG/0.5ML IN NEBU: 1.2500 mg | INHALATION_SOLUTION | Freq: Three times a day (TID) | RESPIRATORY_TRACT | Status: DC | PRN

## 2015-06-29 NOTE — Progress Notes (Signed)
Risk of further worsening in patient's stage 3 decubitus ulcer because of his noncompliance with q2 turning was clearly explained to patient. Patient still does not want to be turned.Will continue to educate patient.

## 2015-06-29 NOTE — Care Management Note (Signed)
Case Management Note  Patient Details  Name: LYNKIN TORRENTE MRN: [id number] Date of Birth: 12-Jul-1970  Subjective/Objective:                    Action/Plan: Patient was admitted with sepsis. Resides at [facility]. Consult was received to pursue LTAC.  Patient has Medicaid, which does not have an LTAC benefit. CM spoke with Gaspar Bidding, [facility]iaison, who states that he will follow patient's progress to see if she will meet criteria for [place] care at time of discharge. CM will continue to follow.  Expected Discharge Date:                  Expected Discharge Plan:  Skilled Nursing Facility  In-House Referral:  Clinical Social Work  Discharge planning Services     Post Acute Care Choice:    Choice offered to:     DME Arranged:    DME Agency:     HH Arranged:    [place] Agency:     Status of Service:  In process, will continue to follow  Medicare Important Message Given:    Date Medicare IM Given:    Medicare IM give by:    Date Additional Medicare IM Given:    Additional Medicare Important Message give by:     If discussed at [place] of Stay Meetings, dates discussed:    Additional CommentsRolm Baptise, RN 06/29/2015, 10:20 AM [phone]

## 2015-06-29 NOTE — Progress Notes (Signed)
CRITICAL VALUE ALERT  Critical value received:  Potassium= 2.5  Date of notification:  06/29/2015  Time of notification:  0602  Critical value read back:yes  Nurse who received alert:    MD notified (1st page): Tamala Julian  Time of first page:  0610  MD notified (2nd page):  Time of second page:  Responding MD:  Tamala Julian  Time MD responded:  [phone]

## 2015-06-29 NOTE — Progress Notes (Signed)
Patient refuses to be turned from side to side to prevent further skin breakdown. Explained to him the importance of not staying in one position too long. He states he will turn later. He also refuses many of his medications. He states that he does not take most of those. Talking with him he has opened up to me and said that he believes that he is going to die soon. He states that he does not feel good like he once used to. He asked me if I would pray for him and I told him I was happy to do that.
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
## 2015-06-30 ENCOUNTER — Encounter (HOSPITAL_COMMUNITY): Payer: Self-pay | Admitting: *Deleted

## 2015-06-30 LAB — COMPREHENSIVE METABOLIC PANEL
ALK PHOS: 86 U/L (ref 38–126)
ALT: 25 U/L (ref 17–63)
ANION GAP: 7 (ref 5–15)
AST: 27 U/L (ref 15–41)
Albumin: 2.4 g/dL — ABNORMAL LOW (ref 3.5–5.0)
BILIRUBIN TOTAL: 0.6 mg/dL (ref 0.3–1.2)
BUN: <5 mg/dL — ABNORMAL LOW (ref 6–20)
CALCIUM: 8.5 mg/dL — AB (ref 8.9–10.3)
CO2: 24 mmol/L (ref 22–32)
Chloride: 107 mmol/L (ref 101–111)
Creatinine, Ser: 0.46 mg/dL — ABNORMAL LOW (ref 0.61–1.24)
Glucose, Bld: 96 mg/dL (ref 65–99)
Potassium: 3.6 mmol/L (ref 3.5–5.1)
Sodium: 138 mmol/L (ref 135–145)
TOTAL PROTEIN: 6 g/dL — AB (ref 6.5–8.1)

## 2015-06-30 LAB — PROTIME-INR
INR: 1.1 (ref 0.00–1.49)
PROTHROMBIN TIME: 14.4 s (ref 11.6–15.2)

## 2015-06-30 LAB — CULTURE, BLOOD (ROUTINE X 2)

## 2015-06-30 LAB — MAGNESIUM: MAGNESIUM: 1.9 mg/dL (ref 1.7–2.4)

## 2015-06-30 MED ORDER — POTASSIUM CHLORIDE IN NACL 20-0.9 MEQ/L-% IV SOLN
INTRAVENOUS | Status: DC
  Administered 2015-06-30: 11:00:00 via INTRAVENOUS
  Filled 2015-06-30 (×2): qty 1000
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
## 2015-07-01 LAB — COMPREHENSIVE METABOLIC PANEL
ALBUMIN: 2.5 g/dL — AB (ref 3.5–5.0)
ALK PHOS: 89 U/L (ref 38–126)
ALT: 24 U/L (ref 17–63)
AST: 20 U/L (ref 15–41)
Anion gap: 9 (ref 5–15)
BUN: <5 mg/dL — ABNORMAL LOW (ref 6–20)
CALCIUM: 8.9 mg/dL (ref 8.9–10.3)
CO2: 26 mmol/L (ref 22–32)
CREATININE: 0.47 mg/dL — AB (ref 0.61–1.24)
Chloride: 104 mmol/L (ref 101–111)
GFR calc non Af Amer: >60 mL/min (ref >60)
GLUCOSE: 103 mg/dL — AB (ref 65–99)
Potassium: 3.6 mmol/L (ref 3.5–5.1)
SODIUM: 139 mmol/L (ref 135–145)
Total Bilirubin: 0.3 mg/dL (ref 0.3–1.2)
Total Protein: 6.6 g/dL (ref 6.5–8.1)

## 2015-07-01 LAB — CBC
HCT: 32.8 % — ABNORMAL LOW (ref 39.0–52.0)
Hemoglobin: 10.8 g/dL — ABNORMAL LOW (ref 13.0–17.0)
MCH: 29.3 pg (ref 26.0–34.0)
MCHC: 32.9 g/dL (ref 30.0–36.0)
MCV: 88.9 fL (ref 78.0–100.0)
PLATELETS: 171 10*3/uL (ref 150–400)
RBC: 3.69 MIL/uL — ABNORMAL LOW (ref 4.22–5.81)
RDW: 15.2 % (ref 11.5–15.5)
WBC: 6.6 10*3/uL (ref 4.0–10.5)

## 2015-07-01 MED ORDER — OXYCODONE HCL 15 MG PO TABS: 7.5000 mg | ORAL_TABLET | ORAL | Status: DC | PRN

## 2015-07-01 MED ORDER — AMOXICILLIN-POT CLAVULANATE 875-125 MG PO TABS: 1.0000 | ORAL_TABLET | Freq: Two times a day (BID) | ORAL | Status: DC

## 2015-07-01 MED ORDER — AMOXICILLIN-POT CLAVULANATE 875-125 MG PO TABS
1.0000 | ORAL_TABLET | Freq: Two times a day (BID) | ORAL | Status: DC
  Administered 2015-07-01: 1 via ORAL
  Filled 2015-07-01: qty 1

## 2015-07-01 MED ORDER — CLONAZEPAM 0.5 MG PO TABS: 0.2500 mg | ORAL_TABLET | Freq: Every day | ORAL | Status: DC | PRN

## 2015-07-01 MED ORDER — POTASSIUM CHLORIDE ER 20 MEQ PO TBCR: 40.0000 meq | EXTENDED_RELEASE_TABLET | Freq: Every day | ORAL | Status: DC

## 2015-07-01 NOTE — Progress Notes (Signed)
Pt refused dressing change for wound on left hip at this time. Will try to do dressing change at a later time.  Laksh Hinners RN, BSN
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
## 2015-07-01 NOTE — Progress Notes (Signed)
Pt has been discharged and there was undocumented waste in the pyxis machine. I wasted 2.5 mg of oxy IR in the sharps container of the pt's room. Ramonita Lab), RN witnessed the waste. The medicine was wasted in the pt's room because he was on contact precautions and I could not bring the wasted medication out of the room to waste in the pyxis.   Grant Fontana RN, BSN

## 2015-07-01 NOTE — Clinical Social Work Note (Signed)
Clinical Social Work Assessment  Patient Details  Name: Joshua Park MRN: [id number] Date of Birth: Feb 25, 1971  Date of referral:  07/01/15               Reason for consult:  Facility Placement                Permission sought to share information with:  Facility Art therapist granted to share information::  Yes, Verbal Permission Granted  Name::        Agency::     Relationship::     Contact Information:     Housing/Transportation Living arrangements for the past 2 months:  [place] of Information:  Patient Patient Interpreter Needed:  None Criminal Activity/Legal Involvement Pertinent to Current Situation/Hospitalization:  No - Comment as needed Significant Relationships:    Lives with:  Facility Resident Do you feel safe going back to the place where you live?  Yes Need for family participation in patient care:  No (Coment)  Care giving concerns: Patient has no concerns. Patient states that he is a resident of [facility] and is looking forward to going back.    Social Worker assessment / plan: Coordinate discharge to [facility].   Employment status:  Disabled (Comment on whether or not currently receiving Disability) Insurance information:    PT Recommendations:  [place] / Referral to community resources:  [place]  Patient/Family's Response to care: Pleasant and looking forward to return.   Patient/Family's Understanding of and Emotional Response to Diagnosis, Current Treatment, and Prognosis: Patient was understanding and accepting.   Emotional Assessment Appearance:  Appears stated age Attitude/Demeanor/Rapport:   (pleasant and appropriate) Affect (typically observed):  Accepting, Appropriate Orientation:  Oriented to Self, Oriented to Place, Oriented to  Time, Oriented to Situation Alcohol / Substance use:  Not Applicable Psych involvement (Current and /or in the  community):  No (Comment)  Discharge Needs  Concerns to be addressed:  No discharge needs identified Readmission within the last 30 days:  No Current discharge risk:  None Barriers to Discharge:  No Barriers Identified   Lela Murfin, Daneil Dolin, LCSW 07/01/2015, 2:50 PM
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
## 2015-07-01 NOTE — Progress Notes (Signed)
Pt was just discharged via EMS transport. Pt is being transported back to [facility]. IV and telemetry box were removed. Pt was in no distress at time of discharge. Report was given to the receiving nurse at [facility]. Nurse was updated on pt's condition and treatments; all questions were answered.   Grant Fontana RN, BSN

## 2015-07-02 LAB — WOUND CULTURE

## 2015-07-02 LAB — URINE CULTURE: Culture: >=100000

## 2015-07-10 ENCOUNTER — Encounter: Payer: Self-pay | Admitting: Internal Medicine

## 2015-07-10 ENCOUNTER — Non-Acute Institutional Stay (SKILLED_NURSING_FACILITY): Payer: Medicaid Other | Admitting: Internal Medicine

## 2015-07-10 DIAGNOSIS — N319 Neuromuscular dysfunction of bladder, unspecified: Secondary | ICD-10-CM

## 2015-07-10 DIAGNOSIS — A419 Sepsis, unspecified organism: Secondary | ICD-10-CM

## 2015-07-10 DIAGNOSIS — G8220 Paraplegia, unspecified: Secondary | ICD-10-CM

## 2015-07-10 DIAGNOSIS — E43 Unspecified severe protein-calorie malnutrition: Secondary | ICD-10-CM

## 2015-07-10 DIAGNOSIS — Z9289 Personal history of other medical treatment: Secondary | ICD-10-CM

## 2015-07-10 DIAGNOSIS — G894 Chronic pain syndrome: Secondary | ICD-10-CM

## 2015-07-10 DIAGNOSIS — N390 Urinary tract infection, site not specified: Secondary | ICD-10-CM | POA: Diagnosis not present

## 2015-07-10 DIAGNOSIS — Z978 Presence of other specified devices: Secondary | ICD-10-CM

## 2015-07-10 DIAGNOSIS — F333 Major depressive disorder, recurrent, severe with psychotic symptoms: Secondary | ICD-10-CM

## 2015-07-10 DIAGNOSIS — R627 Adult failure to thrive: Secondary | ICD-10-CM

## 2015-07-10 DIAGNOSIS — Z960 Presence of urogenital implants: Secondary | ICD-10-CM

## 2015-07-10 NOTE — Progress Notes (Signed)
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   On: 06/27/2015 15:42   Dg Foot 2 Views Left  06/28/2015  CLINICAL DATA:  Pressure ulcer lateral LEFT foot for several months, fever EXAM: LEFT FOOT - 2 VIEW COMPARISON:  None FINDINGS: Diffuse osseous demineralization. Narrowing first MTP joint. Joint spaces otherwise preserved. No acute fracture or dislocation. Small juxta-articular erosion at the base of the proximal phalanx fifth toe, nonspecific could represent an inflammatory arthropathy. However, additionally, question subtle cortical loss at the head of the fifth metatarsal, unable to exclude osteomyelitis. No additional bone destruction. Minimal soft tissue swelling. IMPRESSION: Osseous demineralization with degenerative changes first MTP joint. Small juxta-articular erosion at fifth MTP joint with questionable cortical loss at the fifth  metatarsal head. Unable to exclude subtle osteomyelitis ; recommend MR imaging with and without contrast to exclude osteomyelitis at fifth metatarsal. Electronically Signed   By: Lavonia Dana M.D.   On: 06/28/2015 08:02     Assessment/Plan   ICD-9-CM ICD-10-CM   1. Sepsis secondary to UTI ([place]) 038.9 A41.9    995.91 N39.0    599.0     proteus; resolving  2. Neurogenic bladder 596.54 N31.9   3. FTT (failure to thrive) in adult 783.7 R62.7   4. Paraplegia (HCC) 344.1 G82.20   5. Protein-calorie malnutrition, severe ([place]) 262 E43   6. MDD (major depressive disorder), recurrent, severe, with psychosis ([place]) 296.34 F33.3   7. Chronic pain syndrome 338.4 G89.4   8. Foley catheter in place V45.89 Z92.89     Finish abx on 1/4th  Cont other meds as ordered  PT/OT/ST as indicated  Nutritional supplements as indicated  Foley cath care  Wound care as ordered  GOAL: long term care. Communicated with pt and nursing.  Will follow  Kae Lauman S. Perlie Gold  [facility] [address] [place], [place] [address] [phone] Cell (Monday-Friday 8 AM - 5  PM) [phone] After 5 PM and follow prompts

## 2015-07-11 ENCOUNTER — Non-Acute Institutional Stay (SKILLED_NURSING_FACILITY): Payer: Medicaid Other | Admitting: Adult Health

## 2015-07-11 DIAGNOSIS — L8990 Pressure ulcer of unspecified site, unspecified stage: Secondary | ICD-10-CM

## 2015-07-11 LAB — BASIC METABOLIC PANEL
BUN: 12 mg/dL (ref 4–21)
Creatinine: 0.5 mg/dL — AB (ref <=1.3)
Glucose: 130 mg/dL
POTASSIUM: 4.2 mmol/L (ref 3.4–5.3)
SODIUM: 137 mmol/L (ref 137–147)

## 2015-07-11 LAB — CBC AND DIFFERENTIAL: WBC: 5.9 10^3/mL

## 2015-07-11 LAB — HEPATIC FUNCTION PANEL: BILIRUBIN, TOTAL: 0.3 mg/dL

## 2015-07-19 ENCOUNTER — Non-Acute Institutional Stay (SKILLED_NURSING_FACILITY): Payer: Medicaid Other | Admitting: Adult Health

## 2015-07-19 DIAGNOSIS — F22 Delusional disorders: Secondary | ICD-10-CM | POA: Diagnosis not present

## 2015-07-19 DIAGNOSIS — F418 Other specified anxiety disorders: Secondary | ICD-10-CM | POA: Diagnosis not present

## 2015-07-19 DIAGNOSIS — F419 Anxiety disorder, unspecified: Principal | ICD-10-CM

## 2015-07-19 DIAGNOSIS — F329 Major depressive disorder, single episode, unspecified: Secondary | ICD-10-CM

## 2015-07-22 ENCOUNTER — Inpatient Hospital Stay (HOSPITAL_COMMUNITY)
Admission: EM | Admit: 2015-07-22 | Discharge: 2015-07-31 | DRG: 698 | Disposition: A | Discharge status: Skilled Nursing Facility | Payer: Medicaid Other | Attending: Internal Medicine | Admitting: Internal Medicine

## 2015-07-22 ENCOUNTER — Emergency Department (HOSPITAL_COMMUNITY): Payer: Medicaid Other

## 2015-07-22 ENCOUNTER — Encounter (HOSPITAL_COMMUNITY): Payer: Self-pay | Admitting: Emergency Medicine

## 2015-07-22 ENCOUNTER — Encounter: Payer: Self-pay | Admitting: Adult Health

## 2015-07-22 DIAGNOSIS — Z87891 Personal history of nicotine dependence: Secondary | ICD-10-CM | POA: Diagnosis not present

## 2015-07-22 DIAGNOSIS — G8320 Monoplegia of upper limb affecting unspecified side: Secondary | ICD-10-CM | POA: Diagnosis present

## 2015-07-22 DIAGNOSIS — K5641 Fecal impaction: Secondary | ICD-10-CM | POA: Diagnosis present

## 2015-07-22 DIAGNOSIS — L89152 Pressure ulcer of sacral region, stage 2: Secondary | ICD-10-CM | POA: Diagnosis present

## 2015-07-22 DIAGNOSIS — N179 Acute kidney failure, unspecified: Secondary | ICD-10-CM | POA: Diagnosis present

## 2015-07-22 DIAGNOSIS — Z86718 Personal history of other venous thrombosis and embolism: Secondary | ICD-10-CM | POA: Diagnosis not present

## 2015-07-22 DIAGNOSIS — Z993 Dependence on wheelchair: Secondary | ICD-10-CM

## 2015-07-22 DIAGNOSIS — K5900 Constipation, unspecified: Secondary | ICD-10-CM

## 2015-07-22 DIAGNOSIS — Y738 Miscellaneous gastroenterology and urology devices associated with adverse incidents, not elsewhere classified: Secondary | ICD-10-CM | POA: Diagnosis present

## 2015-07-22 DIAGNOSIS — G894 Chronic pain syndrome: Secondary | ICD-10-CM | POA: Diagnosis present

## 2015-07-22 DIAGNOSIS — A419 Sepsis, unspecified organism: Secondary | ICD-10-CM | POA: Diagnosis present

## 2015-07-22 DIAGNOSIS — N390 Urinary tract infection, site not specified: Secondary | ICD-10-CM | POA: Diagnosis present

## 2015-07-22 DIAGNOSIS — N319 Neuromuscular dysfunction of bladder, unspecified: Secondary | ICD-10-CM | POA: Diagnosis present

## 2015-07-22 DIAGNOSIS — T83511A Infection and inflammatory reaction due to indwelling urethral catheter, initial encounter: Secondary | ICD-10-CM | POA: Diagnosis present

## 2015-07-22 DIAGNOSIS — B964 Proteus (mirabilis) (morganii) as the cause of diseases classified elsewhere: Secondary | ICD-10-CM | POA: Diagnosis present

## 2015-07-22 DIAGNOSIS — G8220 Paraplegia, unspecified: Secondary | ICD-10-CM | POA: Diagnosis present

## 2015-07-22 DIAGNOSIS — I959 Hypotension, unspecified: Secondary | ICD-10-CM | POA: Diagnosis present

## 2015-07-22 DIAGNOSIS — K219 Gastro-esophageal reflux disease without esophagitis: Secondary | ICD-10-CM | POA: Diagnosis present

## 2015-07-22 DIAGNOSIS — L89224 Pressure ulcer of left hip, stage 4: Secondary | ICD-10-CM | POA: Diagnosis present

## 2015-07-22 DIAGNOSIS — L89323 Pressure ulcer of left buttock, stage 3: Secondary | ICD-10-CM | POA: Diagnosis present

## 2015-07-22 DIAGNOSIS — R4182 Altered mental status, unspecified: Secondary | ICD-10-CM | POA: Diagnosis not present

## 2015-07-22 DIAGNOSIS — Z960 Presence of urogenital implants: Secondary | ICD-10-CM

## 2015-07-22 DIAGNOSIS — Z833 Family history of diabetes mellitus: Secondary | ICD-10-CM

## 2015-07-22 DIAGNOSIS — G8929 Other chronic pain: Secondary | ICD-10-CM | POA: Diagnosis present

## 2015-07-22 DIAGNOSIS — L8992 Pressure ulcer of unspecified site, stage 2: Secondary | ICD-10-CM | POA: Diagnosis not present

## 2015-07-22 DIAGNOSIS — E876 Hypokalemia: Secondary | ICD-10-CM | POA: Diagnosis present

## 2015-07-22 DIAGNOSIS — Z803 Family history of malignant neoplasm of breast: Secondary | ICD-10-CM

## 2015-07-22 DIAGNOSIS — Z87440 Personal history of urinary (tract) infections: Secondary | ICD-10-CM | POA: Diagnosis not present

## 2015-07-22 DIAGNOSIS — R7881 Bacteremia: Secondary | ICD-10-CM | POA: Diagnosis not present

## 2015-07-22 DIAGNOSIS — F419 Anxiety disorder, unspecified: Secondary | ICD-10-CM | POA: Diagnosis present

## 2015-07-22 DIAGNOSIS — T83091A Other mechanical complication of indwelling urethral catheter, initial encounter: Secondary | ICD-10-CM | POA: Diagnosis present

## 2015-07-22 DIAGNOSIS — Z9289 Personal history of other medical treatment: Secondary | ICD-10-CM

## 2015-07-22 DIAGNOSIS — Z8611 Personal history of tuberculosis: Secondary | ICD-10-CM

## 2015-07-22 DIAGNOSIS — R6520 Severe sepsis without septic shock: Secondary | ICD-10-CM | POA: Diagnosis present

## 2015-07-22 DIAGNOSIS — F329 Major depressive disorder, single episode, unspecified: Secondary | ICD-10-CM | POA: Diagnosis present

## 2015-07-22 DIAGNOSIS — D649 Anemia, unspecified: Secondary | ICD-10-CM | POA: Diagnosis present

## 2015-07-22 DIAGNOSIS — Z79899 Other long term (current) drug therapy: Secondary | ICD-10-CM | POA: Diagnosis not present

## 2015-07-22 DIAGNOSIS — R339 Retention of urine, unspecified: Secondary | ICD-10-CM

## 2015-07-22 DIAGNOSIS — L8994 Pressure ulcer of unspecified site, stage 4: Secondary | ICD-10-CM

## 2015-07-22 DIAGNOSIS — R319 Hematuria, unspecified: Secondary | ICD-10-CM

## 2015-07-22 DIAGNOSIS — Z8249 Family history of ischemic heart disease and other diseases of the circulatory system: Secondary | ICD-10-CM

## 2015-07-22 DIAGNOSIS — Z978 Presence of other specified devices: Secondary | ICD-10-CM

## 2015-07-22 LAB — URINE MICROSCOPIC-ADD ON

## 2015-07-22 LAB — COMPREHENSIVE METABOLIC PANEL
ALBUMIN: 3.1 g/dL — AB (ref 3.5–5.0)
ALT: 11 U/L — ABNORMAL LOW (ref 17–63)
ANION GAP: 14 (ref 5–15)
AST: 23 U/L (ref 15–41)
Alkaline Phosphatase: 61 U/L (ref 38–126)
BILIRUBIN TOTAL: 0.6 mg/dL (ref 0.3–1.2)
BUN: 36 mg/dL — ABNORMAL HIGH (ref 6–20)
CHLORIDE: 105 mmol/L (ref 101–111)
CO2: 23 mmol/L (ref 22–32)
CREATININE: 3.76 mg/dL — AB (ref 0.61–1.24)
Calcium: 8.8 mg/dL — ABNORMAL LOW (ref 8.9–10.3)
GFR calc non Af Amer: 18 mL/min — ABNORMAL LOW (ref >60)
GFR, EST AFRICAN AMERICAN: 21 mL/min — AB (ref >60)
GLUCOSE: 167 mg/dL — AB (ref 65–99)
POTASSIUM: 3.8 mmol/L (ref 3.5–5.1)
Sodium: 142 mmol/L (ref 135–145)
Total Protein: 7.2 g/dL (ref 6.5–8.1)

## 2015-07-22 LAB — URINALYSIS, ROUTINE W REFLEX MICROSCOPIC
BILIRUBIN URINE: NEGATIVE
Glucose, UA: NEGATIVE mg/dL
KETONES UR: NEGATIVE mg/dL
NITRITE: NEGATIVE
Protein, ur: 100 mg/dL — AB
SPECIFIC GRAVITY, URINE: 1.014 (ref 1.005–1.030)
pH: 7 (ref 5.0–8.0)

## 2015-07-22 LAB — I-STAT CG4 LACTIC ACID, ED
LACTIC ACID, VENOUS: 3.86 mmol/L — AB (ref 0.5–2.0)
LACTIC ACID, VENOUS: 1 mmol/L (ref 0.5–2.0)

## 2015-07-22 LAB — CBC WITH DIFFERENTIAL/PLATELET
BASOS PCT: 0 %
Basophils Absolute: 0 10*3/uL (ref 0.0–0.1)
EOS PCT: 0 %
Eosinophils Absolute: 0 10*3/uL (ref 0.0–0.7)
HCT: 34.7 % — ABNORMAL LOW (ref 39.0–52.0)
HEMOGLOBIN: 11 g/dL — AB (ref 13.0–17.0)
Lymphocytes Relative: 2 %
Lymphs Abs: 0.4 10*3/uL — ABNORMAL LOW (ref 0.7–4.0)
MCH: 28 pg (ref 26.0–34.0)
MCHC: 31.7 g/dL (ref 30.0–36.0)
MCV: 88.3 fL (ref 78.0–100.0)
MONO ABS: 1.3 10*3/uL — AB (ref 0.1–1.0)
Monocytes Relative: 6 %
NEUTROS ABS: 19.9 10*3/uL — AB (ref 1.7–7.7)
NEUTROS PCT: 92 %
PLATELETS: 259 10*3/uL (ref 150–400)
RBC: 3.93 MIL/uL — ABNORMAL LOW (ref 4.22–5.81)
RDW: 15.3 % (ref 11.5–15.5)
WBC: 21.6 10*3/uL — ABNORMAL HIGH (ref 4.0–10.5)

## 2015-07-22 LAB — CREATININE, SERUM
CREATININE: 1.45 mg/dL — AB (ref 0.61–1.24)
GFR calc Af Amer: >60 mL/min (ref >60)
GFR calc non Af Amer: 57 mL/min — ABNORMAL LOW (ref >60)

## 2015-07-22 LAB — CBC
HCT: 32.1 % — ABNORMAL LOW (ref 39.0–52.0)
Hemoglobin: 10.4 g/dL — ABNORMAL LOW (ref 13.0–17.0)
MCH: 28.2 pg (ref 26.0–34.0)
MCHC: 32.4 g/dL (ref 30.0–36.0)
MCV: 87 fL (ref 78.0–100.0)
PLATELETS: 202 10*3/uL (ref 150–400)
RBC: 3.69 MIL/uL — AB (ref 4.22–5.81)
RDW: 15.5 % (ref 11.5–15.5)
WBC: 21.8 10*3/uL — ABNORMAL HIGH (ref 4.0–10.5)

## 2015-07-22 LAB — LACTIC ACID, PLASMA
Lactic Acid, Venous: 1.3 mmol/L (ref 0.5–2.0)
Lactic Acid, Venous: 1.1 mmol/L (ref 0.5–2.0)

## 2015-07-22 LAB — MRSA PCR SCREENING: MRSA by PCR: POSITIVE — AB

## 2015-07-22 MED ORDER — GABAPENTIN 100 MG PO CAPS
100.0000 mg | ORAL_CAPSULE | Freq: Two times a day (BID) | ORAL | Status: DC
  Administered 2015-07-22 – 2015-07-31 (×18): 100 mg via ORAL
  Filled 2015-07-22 (×18): qty 1

## 2015-07-22 MED ORDER — ACETAMINOPHEN 650 MG RE SUPP: 650.0000 mg | Freq: Four times a day (QID) | RECTAL | Status: DC | PRN

## 2015-07-22 MED ORDER — ACETAMINOPHEN 650 MG RE SUPP
650.0000 mg | Freq: Once | RECTAL | Status: AC
  Administered 2015-07-22: 650 mg via RECTAL
  Filled 2015-07-22: qty 1

## 2015-07-22 MED ORDER — BENZTROPINE MESYLATE 0.5 MG PO TABS
0.5000 mg | ORAL_TABLET | Freq: Every day | ORAL | Status: DC
  Administered 2015-07-22 – 2015-07-24 (×2): 0.5 mg via ORAL
  Filled 2015-07-22 (×3): qty 1

## 2015-07-22 MED ORDER — SODIUM CHLORIDE 0.9 % IV BOLUS (SEPSIS)
500.0000 mL | Freq: Once | INTRAVENOUS | Status: AC
  Administered 2015-07-22: 500 mL via INTRAVENOUS

## 2015-07-22 MED ORDER — BACLOFEN 10 MG PO TABS
10.0000 mg | ORAL_TABLET | Freq: Two times a day (BID) | ORAL | Status: DC
  Administered 2015-07-22 – 2015-07-31 (×18): 10 mg via ORAL
  Filled 2015-07-22 (×18): qty 1

## 2015-07-22 MED ORDER — DEXTROSE 5 % IV SOLN
2.0000 g | Freq: Once | INTRAVENOUS | Status: AC
  Administered 2015-07-22: 2 g via INTRAVENOUS
  Filled 2015-07-22: qty 2

## 2015-07-22 MED ORDER — SODIUM CHLORIDE 0.9 % IV BOLUS (SEPSIS)
1000.0000 mL | INTRAVENOUS | Status: AC
  Administered 2015-07-22 (×2): 1000 mL via INTRAVENOUS

## 2015-07-22 MED ORDER — ACETAMINOPHEN 325 MG RE SUPP
325.0000 mg | Freq: Once | RECTAL | Status: DC
  Filled 2015-07-22: qty 1

## 2015-07-22 MED ORDER — SODIUM CHLORIDE 0.9 % IV SOLN
1000.0000 mL | INTRAVENOUS | Status: DC
  Administered 2015-07-22 – 2015-07-31 (×17): 1000 mL via INTRAVENOUS

## 2015-07-22 MED ORDER — ACETAMINOPHEN 325 MG PO TABS
650.0000 mg | ORAL_TABLET | Freq: Four times a day (QID) | ORAL | Status: DC | PRN
  Administered 2015-07-22 – 2015-07-24 (×3): 650 mg via ORAL
  Filled 2015-07-22 (×4): qty 2

## 2015-07-22 MED ORDER — POLYETHYLENE GLYCOL 3350 17 G PO PACK
17.0000 g | PACK | Freq: Every day | ORAL | Status: DC | PRN
  Administered 2015-07-25: 17 g via ORAL
  Filled 2015-07-22 (×2): qty 1

## 2015-07-22 MED ORDER — DEXTROSE 5 % IV SOLN
1.0000 g | INTRAVENOUS | Status: DC
  Administered 2015-07-23: 1 g via INTRAVENOUS
  Filled 2015-07-22: qty 1

## 2015-07-22 MED ORDER — ONDANSETRON HCL 4 MG/2ML IJ SOLN
4.0000 mg | Freq: Four times a day (QID) | INTRAMUSCULAR | Status: DC | PRN
  Administered 2015-07-23 – 2015-07-25 (×2): 4 mg via INTRAVENOUS
  Filled 2015-07-22 (×3): qty 2

## 2015-07-22 MED ORDER — SODIUM CHLORIDE 0.9 % IV BOLUS (SEPSIS)
1000.0000 mL | Freq: Once | INTRAVENOUS | Status: AC
  Administered 2015-07-22: 1000 mL via INTRAVENOUS

## 2015-07-22 MED ORDER — ONDANSETRON HCL 4 MG PO TABS: 4.0000 mg | ORAL_TABLET | Freq: Four times a day (QID) | ORAL | Status: DC | PRN

## 2015-07-22 MED ORDER — SERTRALINE HCL 100 MG PO TABS
100.0000 mg | ORAL_TABLET | Freq: Every morning | ORAL | Status: DC
  Administered 2015-07-23 – 2015-07-27 (×3): 100 mg via ORAL
  Filled 2015-07-22 (×7): qty 1

## 2015-07-22 MED ORDER — DOCUSATE SODIUM 100 MG PO CAPS
100.0000 mg | ORAL_CAPSULE | Freq: Every day | ORAL | Status: DC | PRN
  Administered 2015-07-28: 100 mg via ORAL
  Filled 2015-07-22: qty 1

## 2015-07-22 MED ORDER — ENOXAPARIN SODIUM 30 MG/0.3ML ~~LOC~~ SOLN
30.0000 mg | SUBCUTANEOUS | Status: DC
  Administered 2015-07-22: 30 mg via SUBCUTANEOUS
  Filled 2015-07-22: qty 0.3

## 2015-07-22 MED ORDER — CALCIUM CARBONATE ANTACID 500 MG PO CHEW
2.0000 | CHEWABLE_TABLET | Freq: Every day | ORAL | Status: DC | PRN
Start: 2015-07-22 — End: 2015-07-31
  Administered 2015-07-23 – 2015-07-25 (×2): 400 mg via ORAL
  Filled 2015-07-22 (×2): qty 2

## 2015-07-22 NOTE — ED Notes (Signed)
Called Joshua Park to let her no pt has be admit/transfered to Rm [id number]

## 2015-07-22 NOTE — Plan of Care (Signed)
Problem: Safety: Goal: Ability to remain free from injury will improve Outcome: Progressing Bed in lowest position. Call bell within reach of patient. Bed alarm is on. Patient room near nurses station.
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
## 2015-07-22 NOTE — ED Notes (Signed)
Pt from [facility] with c/o from staff of sepsis -Per EMS, pt has fever, hypotension, tachypnea, tachycardia. Staff sts that pt was admitted for urosepsis 2 weeks ago and called this am for same s/sx. Pt is A&O and in NAD.

## 2015-07-22 NOTE — ED Notes (Signed)
Please update Ms. Nada Boozer (mother) on [phone] per pt request.

## 2015-07-22 NOTE — ED Notes (Signed)
Gave pt water after cleared with EDP. Pt had no difficulty swallowing and is alert and talking
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
## 2015-07-22 NOTE — ED Notes (Signed)
Patient has been changed into hospital bed. Patient is complaing of back pain.

## 2015-07-22 NOTE — ED Notes (Signed)
Bed: WA22 Expected date:  Expected time:  Means of arrival:  Comments: 
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
## 2015-07-23 DIAGNOSIS — N179 Acute kidney failure, unspecified: Secondary | ICD-10-CM

## 2015-07-23 DIAGNOSIS — I959 Hypotension, unspecified: Secondary | ICD-10-CM

## 2015-07-23 LAB — BASIC METABOLIC PANEL
ANION GAP: 10 (ref 5–15)
BUN: 23 mg/dL — AB (ref 6–20)
CHLORIDE: 112 mmol/L — AB (ref 101–111)
CO2: 22 mmol/L (ref 22–32)
Calcium: 8.4 mg/dL — ABNORMAL LOW (ref 8.9–10.3)
Creatinine, Ser: 1.02 mg/dL (ref 0.61–1.24)
GFR calc Af Amer: >60 mL/min (ref >60)
GLUCOSE: 122 mg/dL — AB (ref 65–99)
POTASSIUM: 3.6 mmol/L (ref 3.5–5.1)
Sodium: 144 mmol/L (ref 135–145)

## 2015-07-23 LAB — CBC
HEMATOCRIT: 30.6 % — AB (ref 39.0–52.0)
HEMOGLOBIN: 9.8 g/dL — AB (ref 13.0–17.0)
MCH: 28.3 pg (ref 26.0–34.0)
MCHC: 32 g/dL (ref 30.0–36.0)
MCV: 88.4 fL (ref 78.0–100.0)
Platelets: 198 10*3/uL (ref 150–400)
RBC: 3.46 MIL/uL — ABNORMAL LOW (ref 4.22–5.81)
RDW: 15.8 % — ABNORMAL HIGH (ref 11.5–15.5)
WBC: 17.5 10*3/uL — ABNORMAL HIGH (ref 4.0–10.5)

## 2015-07-23 MED ORDER — SODIUM CHLORIDE 0.9 % IV BOLUS (SEPSIS)
500.0000 mL | Freq: Once | INTRAVENOUS | Status: AC
Start: 2015-07-23 — End: 2015-07-23
  Administered 2015-07-23: 500 mL via INTRAVENOUS

## 2015-07-23 MED ORDER — MUPIROCIN 2 % EX OINT
1.0000 "application " | TOPICAL_OINTMENT | Freq: Two times a day (BID) | CUTANEOUS | Status: AC
  Administered 2015-07-23 – 2015-07-28 (×10): 1 via NASAL
  Filled 2015-07-23 (×3): qty 22

## 2015-07-23 MED ORDER — ENOXAPARIN SODIUM 40 MG/0.4ML ~~LOC~~ SOLN
40.0000 mg | SUBCUTANEOUS | Status: DC
  Administered 2015-07-23 – 2015-07-30 (×8): 40 mg via SUBCUTANEOUS
  Filled 2015-07-23 (×8): qty 0.4

## 2015-07-23 MED ORDER — VITAMINS A & D EX OINT
TOPICAL_OINTMENT | CUTANEOUS | Status: AC
  Administered 2015-07-23: 14:00:00
  Filled 2015-07-23: qty 5

## 2015-07-23 MED ORDER — CHLORHEXIDINE GLUCONATE CLOTH 2 % EX PADS
6.0000 | MEDICATED_PAD | Freq: Every day | CUTANEOUS | Status: AC
  Administered 2015-07-25 – 2015-07-28 (×4): 6 via TOPICAL

## 2015-07-23 MED ORDER — DEXTROSE 5 % IV SOLN
1.0000 g | Freq: Three times a day (TID) | INTRAVENOUS | Status: DC
  Administered 2015-07-23 – 2015-07-26 (×9): 1 g via INTRAVENOUS
  Filled 2015-07-23 (×10): qty 1

## 2015-07-23 NOTE — Progress Notes (Signed)
CRITICAL VALUE ALERT  Critical value received:  + blood cultures (Gram - rods in both aerobic and anaerobic bottles)  Date of notification:  07/23/15  Time of notification: 0300  Critical value read back:Yes.    Nurse who received alert:  Darrin Nipper, RN  MD notified (1st page): NP on call   Time of first page:  0300

## 2015-07-23 NOTE — Progress Notes (Addendum)
eLink Pharmacist-Brief Progress Note Patient Name: Joshua Park DOB: 09-07-1970 MRN: [id number]   Date of Service  07/23/2015  HPI/Events of Note  Lovenox 30 mg SQ q24h ordered. CrCl >60 mL/min, wt 65 kg, hgb/plts stable.   eICU Interventions  Dose adjusted to 40 mg SQ q24h per protocol    [name] 07/23/2015, 2:41 PM

## 2015-07-23 NOTE — Progress Notes (Signed)
PROGRESS NOTE  Joshua Park [id number] DOB: 10-Jan-1971 DOA: 07/22/2015 PCP: Gildardo Cranker, DO  HPI: Patient presented to ED via EMS from [facility] after reports of change in mental status, found to be septic due to a UTI  Subjective / 24 H Interval events - mental status improved this morning  Assessment/Plan: Active Problems:   Decubitus ulcer limited to breakdown of skin (stage 2)   Chronic pain syndrome   Chronic indwelling Foley catheter   Sepsis ([place])   Complicated urinary tract infection   Hypotension   AKI (acute kidney injury) ([place])  Severe sepsis with renal failure and hypotension - improved with hydration, continue antibiotics - lactic acid normalized with fluids - leukocytosis improving   Anemia - due to chronic illness / acute sepsis - no bleeding, monitor CBC  GNR bacteremia - on Cefepime   UTI  - continue Cefepime. Cultures pending  Neurogenic bladder  - foley was changed in ED   AKI  - 2/2 obstructed foley vs sepsis   Diet: Diet clear liquid Room service appropriate?: Yes; Fluid consistency:: Thin Fluids: NS DVT Prophylaxis: Lovenox  Code Status: Full Code Family Communication: no family bedside  Disposition Plan: remain in SDU  Barriers to discharge: IV therapies  Consultants:  None   Procedures:  None    Antibiotics Cefepime 1/15 >>   Studies  Dg Chest Port 1 View  07/22/2015  CLINICAL DATA:  Shortness of breath and fever today, former smoker, history TB, paraplegia EXAM: PORTABLE CHEST 1 VIEW COMPARISON:  Portable exam 1007 hours compared 06/28/2015 FINDINGS: Embolization coils LEFT cervical region. Normal heart size, mediastinal contours and pulmonary vascularity. Lungs clear. No pleural effusion or pneumothorax. Bones unremarkable. IMPRESSION: No acute abnormalities. Electronically Signed   By: Lavonia Dana M.D.   On: 07/22/2015 11:10    Objective  Filed Vitals:   07/23/15 0600 07/23/15 0700 07/23/15 0800  07/23/15 0900  BP: 108/56 94/34 103/48 113/62  Pulse: 120 109 107 101  Temp:   100.8 F (38.2 C)   TempSrc:   Oral   Resp: 26 32 23 25  Height:      Weight:      SpO2: 100% 96% 96% 97%    Intake/Output Summary (Last 24 hours) at 07/23/15 1036 Last data filed at 07/23/15 0930  Gross per 24 hour  Intake 2432.92 ml  Output   2100 ml  Net 332.92 ml   Filed Weights   07/22/15 1800 07/23/15 0500  Weight: 61.7 kg (136 lb 0.4 oz) 65.2 kg (143 lb 11.8 oz)    Exam:  GENERAL: NAD  HEENT: no scleral icterus  LUNGS: CTA biL, no wheezing  HEART: RRR without MRG  ABDOMEN: soft, non tender  EXTREMITIES: no clubbing / cyanosis  NEUROLOGIC: non focal, paraplegic  PSYCHIATRIC: flat affect  Data Reviewed: Basic Metabolic Panel:  Recent Labs Lab 07/22/15 1002 07/22/15 1836 07/23/15 0340  NA 142  --  144  K 3.8  --  3.6  CL 105  --  112*  CO2 23  --  22  GLUCOSE 167*  --  122*  BUN 36*  --  23*  CREATININE 3.76* 1.45* 1.02  CALCIUM 8.8*  --  8.4*   Liver Function Tests:  Recent Labs Lab 07/22/15 1002  AST 23  ALT 11*  ALKPHOS 61  BILITOT 0.6  PROT 7.2  ALBUMIN 3.1*   CBC:  Recent Labs Lab 07/22/15 1002 07/22/15 1836 07/23/15 0340  WBC 21.6* 21.8* 17.5*  NEUTROABS 19.9*  --   --   HGB 11.0* 10.4* 9.8*  HCT 34.7* 32.1* 30.6*  MCV 88.3 87.0 88.4  PLT 259 202 198   BNP (last 3 results)  Recent Labs  09/04/14 0127 12/11/14 1202  BNP 9.5 2.3   Recent Results (from the past 240 hour(s))  Blood Culture (routine x 2)     Status: None (Preliminary result)   Collection Time: 07/22/15  9:50 AM  Result Value Ref Range Status   Specimen Description BLOOD LEFT ANTECUBITAL  Final   Special Requests BOTTLES DRAWN AEROBIC AND ANAEROBIC 5CC  Final   Culture  Setup Time   Final    GRAM NEGATIVE RODS IN BOTH AEROBIC AND ANAEROBIC BOTTLES CRITICAL RESULT CALLED TO, READ BACK BY AND VERIFIED WITH: Luciano Cutter [id number] [place] Performed at [facility]    Culture PENDING  Incomplete   Report Status PENDING  Incomplete  Blood Culture (routine x 2)     Status: None (Preliminary result)   Collection Time: 07/22/15 10:00 AM  Result Value Ref Range Status   Specimen Description BLOOD LEFT ARM  Final   Special Requests BOTTLES DRAWN AEROBIC AND ANAEROBIC 5CC  Final   Culture  Setup Time   Final    GRAM NEGATIVE RODS IN BOTH AEROBIC AND ANAEROBIC BOTTLES CRITICAL RESULT CALLED TO, READ BACK BY AND VERIFIED WITH: Luciano Cutter [id number] [place] Performed at [facility]    Culture PENDING  Incomplete   Report Status PENDING  Incomplete  MRSA PCR Screening     Status: Abnormal   Collection Time: 07/22/15  6:10 PM  Result Value Ref Range Status   MRSA by PCR POSITIVE (A) NEGATIVE Final    Comment:        The GeneXpert MRSA Assay (FDA approved for NASAL specimens only), is one component of a comprehensive MRSA colonization surveillance program. It is not intended to diagnose MRSA infection nor to guide or monitor treatment for MRSA infections. RESULT CALLED TO, READ BACK BY AND VERIFIED WITH: M.BARHAM,RN AT 2016 ON 07/22/15 BY W.SHEA      Scheduled Meds: . baclofen  10 mg Oral BID  . benztropine  0.5 mg Oral QHS  . ceFEPime (MAXIPIME) IV  1 g Intravenous Q24H  . enoxaparin (LOVENOX) injection  30 mg Subcutaneous Q24H  . gabapentin  100 mg Oral BID  . sertraline  100 mg Oral q morning - 10a   Continuous Infusions: . sodium chloride 1,000 mL (07/22/15 1910)    Marzetta Board, MD [name] Pager [phone]. If 7 PM - 7 AM, please contact night-coverage at [email or web address], password [facility] 07/23/2015, 10:36 AM  LOS: 1 day

## 2015-07-23 NOTE — Progress Notes (Signed)
Pharmacy Antibiotic Follow-up Note  Joshua Park is a 45 y.o. year-old male admitted on 07/22/2015.  The patient is currently on day 2 of Cefepime for urosepsis.  Assessment/Plan: The dose of Cefepime will be adjusted to 1gm IV q8h based on renal function.  Monitor renal function and cx data   Temp (24hrs), Avg:100.9 F (38.3 C), Min:99 F (37.2 C), Max:103 F (39.4 C)   Recent Labs Lab 07/22/15 1002 07/22/15 1836 07/23/15 0340  WBC 21.6* 21.8* 17.5*    Recent Labs Lab 07/22/15 1002 07/22/15 1836 07/23/15 0340  CREATININE 3.76* 1.45* 1.02   Estimated Creatinine Clearance: 85.2 mL/min (by C-G formula based on Cr of 1.02).    No Known Allergies  Antimicrobials this admission: 1/15 >> Cefepime    Levels/dose changes this admission: 1/16: Cefepime dose increased to 1gm IV q8h for resolution of AKI  Microbiology results: 1/15 bloodx2: NGTD 1/15 urine: >100K GNR (reincubated for better growth) 1/15 MRSA PCR: postive 12/21 urine:  >100K proteus (R-cipro, nitrofurantoin, bactrim); > 100K enterococcus (pan sensitive)  Thank you for allowing pharmacy to be a part of this patient's care.  Biagio Borg PharmD 07/23/2015 4:13 PM

## 2015-07-23 NOTE — Care Management Note (Signed)
Case Management Note  Patient Details  Name: Joshua Park MRN: [id number] Date of Birth: [birth year]  Subjective/Objective:             sepsis       Action/Plan:Date: July 23, 2015 Chart reviewed for concurrent status and case management needs. Will continue to follow patient for changes and needs: Velva Harman, RN, BSN, [place]   [phone]   Expected Discharge Date:                  Expected Discharge Plan:  Skilled Nursing Facility  In-House Referral:  Clinical Social Work  Discharge planning Services  CM Consult  Post Acute Care Choice:  NA Choice offered to:  NA  DME Arranged:    DME Agency:     HH Arranged:    [place] Agency:     Status of Service:  Completed, signed off  Medicare Important Message Given:    Date Medicare IM Given:    Medicare IM give by:    Date Additional Medicare IM Given:    Additional Medicare Important Message give by:     If discussed at [place] of Stay Meetings, dates discussed:    Additional Comments:  Leeroy Cha, RN 07/23/2015, 11:43 AM

## 2015-07-23 NOTE — Progress Notes (Signed)
Patient febrile (100.5), tachycardic (120's) and experiencing chills. Received tylenol earlier in the shift for the same symptoms, this was effective. Patient now refusing to take more tylenol despite how ill he feels.

## 2015-07-24 DIAGNOSIS — R7881 Bacteremia: Secondary | ICD-10-CM

## 2015-07-24 DIAGNOSIS — G894 Chronic pain syndrome: Secondary | ICD-10-CM

## 2015-07-24 DIAGNOSIS — L8992 Pressure ulcer of unspecified site, stage 2: Secondary | ICD-10-CM

## 2015-07-24 LAB — CBC
HCT: 31.7 % — ABNORMAL LOW (ref 39.0–52.0)
Hemoglobin: 10.4 g/dL — ABNORMAL LOW (ref 13.0–17.0)
MCH: 29.1 pg (ref 26.0–34.0)
MCHC: 32.8 g/dL (ref 30.0–36.0)
MCV: 88.5 fL (ref 78.0–100.0)
PLATELETS: 213 10*3/uL (ref 150–400)
RBC: 3.58 MIL/uL — ABNORMAL LOW (ref 4.22–5.81)
RDW: 15.7 % — AB (ref 11.5–15.5)
WBC: 19.1 10*3/uL — ABNORMAL HIGH (ref 4.0–10.5)

## 2015-07-24 LAB — BASIC METABOLIC PANEL
Anion gap: 12 (ref 5–15)
BUN: 14 mg/dL (ref 6–20)
CALCIUM: 8.6 mg/dL — AB (ref 8.9–10.3)
CO2: 22 mmol/L (ref 22–32)
CREATININE: 0.61 mg/dL (ref 0.61–1.24)
Chloride: 108 mmol/L (ref 101–111)
GFR calc non Af Amer: >60 mL/min (ref >60)
GLUCOSE: 121 mg/dL — AB (ref 65–99)
Potassium: 3.2 mmol/L — ABNORMAL LOW (ref 3.5–5.1)
Sodium: 142 mmol/L (ref 135–145)

## 2015-07-24 LAB — URINE CULTURE

## 2015-07-24 MED ORDER — POTASSIUM CHLORIDE CRYS ER 20 MEQ PO TBCR
40.0000 meq | EXTENDED_RELEASE_TABLET | Freq: Once | ORAL | Status: AC
  Administered 2015-07-24: 40 meq via ORAL
  Filled 2015-07-24: qty 2

## 2015-07-24 MED ORDER — OXYCODONE HCL 5 MG PO TABS
5.0000 mg | ORAL_TABLET | ORAL | Status: DC | PRN
  Administered 2015-07-24 – 2015-07-25 (×3): 5 mg via ORAL
  Administered 2015-07-25 – 2015-07-27 (×8): 10 mg via ORAL
  Administered 2015-07-28 – 2015-07-31 (×14): 5 mg via ORAL
  Filled 2015-07-24: qty 2
  Filled 2015-07-24: qty 1
  Filled 2015-07-24 (×2): qty 2
  Filled 2015-07-24 (×5): qty 1
  Filled 2015-07-24: qty 2
  Filled 2015-07-24: qty 1
  Filled 2015-07-24: qty 2
  Filled 2015-07-24 (×4): qty 1
  Filled 2015-07-24: qty 2
  Filled 2015-07-24: qty 1
  Filled 2015-07-24: qty 2
  Filled 2015-07-24 (×2): qty 1
  Filled 2015-07-24 (×3): qty 2
  Filled 2015-07-24: qty 1
  Filled 2015-07-24 (×2): qty 2

## 2015-07-24 MED ORDER — HYDRALAZINE HCL 20 MG/ML IJ SOLN
10.0000 mg | Freq: Once | INTRAMUSCULAR | Status: AC
  Administered 2015-07-24: 10 mg via INTRAVENOUS
  Filled 2015-07-24: qty 1

## 2015-07-24 NOTE — Progress Notes (Signed)
Report given to [facility], RN and all questions answered. Pt transferred via bed to 1428 to air overlay mattress.

## 2015-07-24 NOTE — Progress Notes (Signed)
PROGRESS NOTE  Joshua Park [id number] DOB: 1970/08/25 DOA: 07/22/2015 PCP: Gildardo Cranker, DO  HPI: Patient presented to ED via EMS from [facility] after reports of change in mental status, found to be septic due to a UTI.   Interval history The patient was admitted on 07/22/2015 with severe sepsis with hypotension and elevated lactic acid, he was initially placed in stepdown and fluid resuscitated. He was started on broad-spectrum IV antibiotics with cefepime and his sepsis physiology is improved, he was transferred to the floor on 1/17. His blood cultures and urine cultures are speciated gram-negative rods.  Subjective / 24 H Interval events - Endorses generalized pain, he has a history of chronic pain - He denies any fever or chills, denies any nausea or vomiting  Assessment/Plan: Active Problems:   Decubitus ulcer limited to breakdown of skin (stage 2)   Chronic pain syndrome   Chronic indwelling Foley catheter   Sepsis ([place])   Complicated urinary tract infection   Hypotension   AKI (acute kidney injury) ([place])  Severe sepsis with renal failure and hypotension - improved with hydration, continue antibiotics - lactic acid normalized with fluids - leukocytosis still elevated at 19 K, continue to monitor - Sepsis physiology improved, we'll transfer to the telemetry floor today - His hypotension resolved, blood pressure in the 150s this morning,  Anemia - due to chronic illness / acute sepsis - no bleeding, monitor CBC - Hemoglobin this morning at 10.4  GNR bacteremia and gram-negative rods in the urine culture - on Cefepime, continue  UTI  - continue Cefepime. Cultures pending  Neurogenic bladder  - foley was changed in ED   AKI  - 2/2 obstructed foley vs sepsis - Renal function improved nicely with hydration   Diet: Diet clear liquid Room service appropriate?: Yes; Fluid consistency:: Thin Fluids: NS DVT Prophylaxis: Lovenox  Code Status: Full  Code Family Communication: no family bedside  Disposition Plan: Transfer to telemetry  Barriers to discharge: IV therapies  Consultants:  None   Procedures:  None    Antibiotics Cefepime 1/15 >>   Studies  Dg Chest Port 1 View  07/22/2015  CLINICAL DATA:  Shortness of breath and fever today, former smoker, history TB, paraplegia EXAM: PORTABLE CHEST 1 VIEW COMPARISON:  Portable exam 1007 hours compared 06/28/2015 FINDINGS: Embolization coils LEFT cervical region. Normal heart size, mediastinal contours and pulmonary vascularity. Lungs clear. No pleural effusion or pneumothorax. Bones unremarkable. IMPRESSION: No acute abnormalities. Electronically Signed   By: Lavonia Dana M.D.   On: 07/22/2015 11:10    Objective  Filed Vitals:   07/24/15 0350 07/24/15 0400 07/24/15 0500 07/24/15 0742  BP:  156/91 153/91   Pulse:  91 88   Temp:  99.1 F (37.3 C)  99.2 F (37.3 C)  TempSrc:  Oral  Oral  Resp:  23 19   Height:      Weight: 62.8 kg (138 lb 7.2 oz)     SpO2:  97% 97%     Intake/Output Summary (Last 24 hours) at 07/24/15 0758 Last data filed at 07/24/15 0500  Gross per 24 hour  Intake   2895 ml  Output   2600 ml  Net    295 ml   Filed Weights   07/22/15 1800 07/23/15 0500 07/24/15 0350  Weight: 61.7 kg (136 lb 0.4 oz) 65.2 kg (143 lb 11.8 oz) 62.8 kg (138 lb 7.2 oz)    Exam:  GENERAL: NAD  HEENT: no scleral icterus  LUNGS: CTA biL, no wheezing  HEART: RRR without MRG  ABDOMEN: soft, non tender  EXTREMITIES: no clubbing / cyanosis  NEUROLOGIC: non focal, paraplegic  PSYCHIATRIC: flat affect  Data Reviewed: Basic Metabolic Panel:  Recent Labs Lab 07/22/15 1002 07/22/15 1836 07/23/15 0340 07/24/15 0340  NA 142  --  144 142  K 3.8  --  3.6 3.2*  CL 105  --  112* 108  CO2 23  --  22 22  GLUCOSE 167*  --  122* 121*  BUN 36*  --  23* 14  CREATININE 3.76* 1.45* 1.02 0.61  CALCIUM 8.8*  --  8.4* 8.6*   Liver Function Tests:  Recent Labs Lab  07/22/15 1002  AST 23  ALT 11*  ALKPHOS 61  BILITOT 0.6  PROT 7.2  ALBUMIN 3.1*   CBC:  Recent Labs Lab 07/22/15 1002 07/22/15 1836 07/23/15 0340 07/24/15 0340  WBC 21.6* 21.8* 17.5* 19.1*  NEUTROABS 19.9*  --   --   --   HGB 11.0* 10.4* 9.8* 10.4*  HCT 34.7* 32.1* 30.6* 31.7*  MCV 88.3 87.0 88.4 88.5  PLT 259 202 198 213   BNP (last 3 results)  Recent Labs  09/04/14 0127 12/11/14 1202  BNP 9.5 2.3   Recent Results (from the past 240 hour(s))  Urine culture     Status: None (Preliminary result)   Collection Time: 07/22/15  9:47 AM  Result Value Ref Range Status   Specimen Description URINE, CATHETERIZED  Final   Special Requests NONE  Final   Culture   Final    >=100,000 COLONIES/mL GRAM NEGATIVE RODS CULTURE REINCUBATED FOR BETTER GROWTH Performed at [facility]    Report Status PENDING  Incomplete  Blood Culture (routine x 2)     Status: None (Preliminary result)   Collection Time: 07/22/15  9:50 AM  Result Value Ref Range Status   Specimen Description BLOOD LEFT ANTECUBITAL  Final   Special Requests BOTTLES DRAWN AEROBIC AND ANAEROBIC 5CC  Final   Culture  Setup Time   Final    GRAM NEGATIVE RODS IN BOTH AEROBIC AND ANAEROBIC BOTTLES CRITICAL RESULT CALLED TO, READ BACK BY AND VERIFIED WITH: Luciano Cutter [id number] [place]    Culture   Final    NO GROWTH < 24 HOURS Performed at [facility]    Report Status PENDING  Incomplete  Blood Culture (routine x 2)     Status: None (Preliminary result)   Collection Time: 07/22/15 10:00 AM  Result Value Ref Range Status   Specimen Description BLOOD LEFT ARM  Final   Special Requests BOTTLES DRAWN AEROBIC AND ANAEROBIC 5CC  Final   Culture  Setup Time   Final    GRAM NEGATIVE RODS IN BOTH AEROBIC AND ANAEROBIC BOTTLES CRITICAL RESULT CALLED TO, READ BACK BY AND VERIFIED WITH: Luciano Cutter [id number] [place]    Culture   Final    NO GROWTH < 24 HOURS Performed at [facility]     Report Status PENDING  Incomplete  MRSA PCR Screening     Status: Abnormal   Collection Time: 07/22/15  6:10 PM  Result Value Ref Range Status   MRSA by PCR POSITIVE (A) NEGATIVE Final    Comment:        The GeneXpert MRSA Assay (FDA approved for NASAL specimens only), is one component of a comprehensive MRSA colonization surveillance program. It is not intended to diagnose MRSA infection nor to guide or monitor treatment  for MRSA infections. RESULT CALLED TO, READ BACK BY AND VERIFIED WITH: M.BARHAM,RN AT 2016 ON 07/22/15 BY W.SHEA      Scheduled Meds: . baclofen  10 mg Oral BID  . benztropine  0.5 mg Oral QHS  . ceFEPime (MAXIPIME) IV  1 g Intravenous Q8H  . Chlorhexidine Gluconate Cloth  6 each Topical Q0600  . enoxaparin (LOVENOX) injection  40 mg Subcutaneous Q24H  . gabapentin  100 mg Oral BID  . mupirocin ointment  1 application Nasal BID  . sertraline  100 mg Oral q morning - 10a   Continuous Infusions: . sodium chloride 1,000 mL (07/24/15 0025)    Marzetta Board, MD [name] Pager [phone]. If 7 PM - 7 AM, please contact night-coverage at [email or web address], password [facility] 07/24/2015, 7:58 AM  LOS: 2 days

## 2015-07-24 NOTE — Consult Note (Signed)
WOC wound follow up Wound type: Pressure to left hip.   Measurement: Per RN measurements today Wound [id number] Stage 4 pressure injury, 100% beefy red  Drainage (amount, consistency, odor) moderate amount serous drainage Periwound: Intact and free of callous Dressing procedure/placement/frequency: Will add a silver hydrofiber for absorption of exudate to other dressings in the POC.   [place] nursing team will not follow, but will remain available to this patient, the nursing and medical teams.  Please re-consult if needed. Thanks, Maudie Flakes, MSN, RN, [place], Arther Abbott  Pager# [phone]

## 2015-07-25 ENCOUNTER — Inpatient Hospital Stay (HOSPITAL_COMMUNITY): Payer: Medicaid Other

## 2015-07-25 LAB — CBC
HEMATOCRIT: 30 % — AB (ref 39.0–52.0)
HEMOGLOBIN: 9.5 g/dL — AB (ref 13.0–17.0)
MCH: 28.4 pg (ref 26.0–34.0)
MCHC: 31.7 g/dL (ref 30.0–36.0)
MCV: 89.6 fL (ref 78.0–100.0)
Platelets: 213 10*3/uL (ref 150–400)
RBC: 3.35 MIL/uL — AB (ref 4.22–5.81)
RDW: 15.8 % — ABNORMAL HIGH (ref 11.5–15.5)
WBC: 18.6 10*3/uL — ABNORMAL HIGH (ref 4.0–10.5)

## 2015-07-25 LAB — CULTURE, BLOOD (ROUTINE X 2)

## 2015-07-25 LAB — BASIC METABOLIC PANEL
ANION GAP: 10 (ref 5–15)
BUN: 15 mg/dL (ref 6–20)
CHLORIDE: 113 mmol/L — AB (ref 101–111)
CO2: 23 mmol/L (ref 22–32)
CREATININE: 0.58 mg/dL — AB (ref 0.61–1.24)
Calcium: 9 mg/dL (ref 8.9–10.3)
GFR calc non Af Amer: >60 mL/min (ref >60)
Glucose, Bld: 99 mg/dL (ref 65–99)
POTASSIUM: 3.2 mmol/L — AB (ref 3.5–5.1)
Sodium: 146 mmol/L — ABNORMAL HIGH (ref 135–145)

## 2015-07-25 MED ORDER — BOOST / RESOURCE BREEZE PO LIQD
1.0000 | Freq: Three times a day (TID) | ORAL | Status: DC
  Administered 2015-07-26 (×2): 1 via ORAL

## 2015-07-25 MED ORDER — BISACODYL 10 MG RE SUPP
10.0000 mg | Freq: Every day | RECTAL | Status: DC | PRN
  Administered 2015-07-25: 10 mg via RECTAL
  Filled 2015-07-25: qty 1

## 2015-07-25 MED ORDER — POTASSIUM CHLORIDE CRYS ER 20 MEQ PO TBCR
40.0000 meq | EXTENDED_RELEASE_TABLET | Freq: Once | ORAL | Status: AC
  Administered 2015-07-25: 40 meq via ORAL
  Filled 2015-07-25: qty 2
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
## 2015-07-25 NOTE — Progress Notes (Signed)
[name] PROGRESS NOTE  Joshua Park [id number] DOB: 1970-09-17 DOA: 07/22/2015 PCP: Gildardo Cranker, DO  HPI/Brief narrative [phone] who presented to ED via EMS from [facility] after reports of change in mental status, found to be septic due to a UTI.   Assessment/Plan: Severe sepsis with renal failure and hypotension - improved with hydration, continue antibiotics - lactic acid normalized with IV fluids - leukocytosis still elevated at 19K, improved to 18.6K - Hypotension resolved  Anemia - due to chronic illness / acute sepsis - no bleeding, monitor CBC - Hemoglobin this morning at 10.4  GNR bacteremia and gram-negative rods in the urine culture - on Cefepime, continue  UTI  - continue Cefepime. Cultures pos for Proteus - As pt still appears ill, will cont IV abx for the time being  Neurogenic bladder  - foley was changed in ED   AKI  - 2/2 obstructed foley vs sepsis - Renal function improved nicely with hydration  Constipation - Large stool noted on imaging - Dulcolax suppository and miralax ordered.  Code Status: Full Family Communication: Pt in room Disposition Plan: Unclear at this time   Consultants:    Procedures:    Antibiotics: Anti-infectives    Start     Dose/Rate Route Frequency Ordered Stop   07/23/15 1800  ceFEPIme (MAXIPIME) 1 g in dextrose 5 % 50 mL IVPB     1 g 100 mL/hr over 30 Minutes Intravenous Every 8 hours 07/23/15 1617     07/23/15 1000  ceFEPIme (MAXIPIME) 1 g in dextrose 5 % 50 mL IVPB  Status:  Discontinued     1 g 100 mL/hr over 30 Minutes Intravenous Every 24 hours 07/22/15 1209 07/23/15 1617   07/22/15 0930  ceFEPIme (MAXIPIME) 2 g in dextrose 5 % 50 mL IVPB     2 g 100 mL/hr over 30 Minutes Intravenous  Once 07/22/15 0927 07/22/15 1050      HPI/Subjective: Complains of abd pain, fullness, nausea  Objective: Filed Vitals:   07/24/15 1739 07/24/15 2115 07/25/15 0731 07/25/15 1551  BP: 133/84  153/87 168/88 161/82  Pulse: 78 83 75 68  Temp:  98.4 F (36.9 C) 98.3 F (36.8 C) 98.8 F (37.1 C)  TempSrc:  Oral Oral Oral  Resp:  16 20 20   Height:      Weight:      SpO2:  100% 100% 97%    Intake/Output Summary (Last 24 hours) at 07/25/15 1725 Last data filed at 07/25/15 1551  Gross per 24 hour  Intake 593.75 ml  Output   2750 ml  Net -2156.25 ml   Filed Weights   07/22/15 1800 07/23/15 0500 07/24/15 0350  Weight: 61.7 kg (136 lb 0.4 oz) 65.2 kg (143 lb 11.8 oz) 62.8 kg (138 lb 7.2 oz)    Exam:   General:  Awake, in mild discomfort  Cardiovascular: regular, s1, s2  Respiratory: normal resp effort, no wheezing  Abdomen: generally tender, distended  Musculoskeletal: perfused, no clubbing   Data Reviewed: Basic Metabolic Panel:  Recent Labs Lab 07/22/15 1002 07/22/15 1836 07/23/15 0340 07/24/15 0340 07/25/15 0453  NA 142  --  144 142 146*  K 3.8  --  3.6 3.2* 3.2*  CL 105  --  112* 108 113*  CO2 23  --  22 22 23   GLUCOSE 167*  --  122* 121* 99  BUN 36*  --  23* 14 15  CREATININE 3.76* 1.45* 1.02 0.61 0.58*  CALCIUM 8.8*  --  8.4* 8.6* 9.0   Liver Function Tests:  Recent Labs Lab 07/22/15 1002  AST 23  ALT 11*  ALKPHOS 61  BILITOT 0.6  PROT 7.2  ALBUMIN 3.1*   No results for input(s): LIPASE, AMYLASE in the last 168 hours. No results for input(s): AMMONIA in the last 168 hours. CBC:  Recent Labs Lab 07/22/15 1002 07/22/15 1836 07/23/15 0340 07/24/15 0340 07/25/15 0453  WBC 21.6* 21.8* 17.5* 19.1* 18.6*  NEUTROABS 19.9*  --   --   --   --   HGB 11.0* 10.4* 9.8* 10.4* 9.5*  HCT 34.7* 32.1* 30.6* 31.7* 30.0*  MCV 88.3 87.0 88.4 88.5 89.6  PLT 259 202 198 213 213   Cardiac Enzymes: No results for input(s): CKTOTAL, CKMB, CKMBINDEX, TROPONINI in the last 168 hours. BNP (last 3 results)  Recent Labs  09/04/14 0127 12/11/14 1202  BNP 9.5 2.3    ProBNP (last 3 results) No results for input(s): PROBNP in the last 8760  hours.  CBG: No results for input(s): GLUCAP in the last 168 hours.  Recent Results (from the past 240 hour(s))  Urine culture     Status: None   Collection Time: 07/22/15  9:47 AM  Result Value Ref Range Status   Specimen Description URINE, CATHETERIZED  Final   Special Requests NONE  Final   Culture   Final    MULTIPLE SPECIES PRESENT, SUGGEST RECOLLECTION Performed at [facility]    Report Status 07/24/2015 FINAL  Final  Blood Culture (routine x 2)     Status: None   Collection Time: 07/22/15  9:50 AM  Result Value Ref Range Status   Specimen Description BLOOD LEFT ANTECUBITAL  Final   Special Requests BOTTLES DRAWN AEROBIC AND ANAEROBIC 5CC  Final   Culture  Setup Time   Final    GRAM NEGATIVE RODS IN BOTH AEROBIC AND ANAEROBIC BOTTLES CRITICAL RESULT CALLED TO, READ BACK BY AND VERIFIED WITH: Luciano Cutter [id number] [place]    Culture   Final    PROTEUS MIRABILIS Performed at [facility]    Report Status 07/25/2015 FINAL  Final   Organism ID, Bacteria PROTEUS MIRABILIS  Final      Susceptibility   Proteus mirabilis - MIC*    AMPICILLIN <=2 SENSITIVE Sensitive     CEFAZOLIN <=4 SENSITIVE Sensitive     CEFEPIME <=1 SENSITIVE Sensitive     CEFTAZIDIME <=1 SENSITIVE Sensitive     CEFTRIAXONE <=1 SENSITIVE Sensitive     CIPROFLOXACIN >=4 RESISTANT Resistant     GENTAMICIN <=1 SENSITIVE Sensitive     IMIPENEM 4 SENSITIVE Sensitive     TRIMETH/SULFA >=320 RESISTANT Resistant     AMPICILLIN/SULBACTAM <=2 SENSITIVE Sensitive     PIP/TAZO <=4 SENSITIVE Sensitive     * PROTEUS MIRABILIS  Blood Culture (routine x 2)     Status: None   Collection Time: 07/22/15 10:00 AM  Result Value Ref Range Status   Specimen Description BLOOD LEFT ARM  Final   Special Requests BOTTLES DRAWN AEROBIC AND ANAEROBIC 5CC  Final   Culture  Setup Time   Final    GRAM NEGATIVE RODS IN BOTH AEROBIC AND ANAEROBIC BOTTLES CRITICAL RESULT CALLED TO, READ BACK BY AND VERIFIED WITH:  Luciano Cutter [id number] [place]    Culture   Final    PROTEUS MIRABILIS SUSCEPTIBILITIES PERFORMED ON PREVIOUS CULTURE WITHIN THE LAST 5 DAYS. Performed at [facility]    Report Status 07/25/2015 FINAL  Final  MRSA PCR Screening     Status: Abnormal   Collection Time: 07/22/15  6:10 PM  Result Value Ref Range Status   MRSA by PCR POSITIVE (A) NEGATIVE Final    Comment:        The GeneXpert MRSA Assay (FDA approved for NASAL specimens only), is one component of a comprehensive MRSA colonization surveillance program. It is not intended to diagnose MRSA infection nor to guide or monitor treatment for MRSA infections. 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 Initial encounter. EXAM: PORTABLE ABDOMEN - 1 VIEW COMPARISON:  CT Abdomen and Pelvis 06/27/2015 and earlier. FINDINGS: Portable AP supine view at 1524 hours. Stable visualized osseous structures. IVC filter remains in place. Urinary versus rectal catheter projects over the pelvis. Non obstructed bowel gas pattern. Moderate to large volume of retained stool in the colon, stable to mildly increased overall compared to the prior CT. Stable visualized lung bases. No definite pneumoperitoneum on these supine views. IMPRESSION: Nonobstructed bowel-gas pattern with moderate to large volume of retained stool in a colon which appears mildly increased compared to the recent CT Abdomen and Pelvis. Electronically Signed   By: Genevie Ann M.D.   On: 07/25/2015 15:48    Scheduled Meds: . baclofen  10 mg Oral BID  . benztropine  0.5 mg Oral QHS  . ceFEPime (MAXIPIME) IV  1 g Intravenous Q8H  . Chlorhexidine Gluconate Cloth  6 each Topical Q0600  . enoxaparin (LOVENOX) injection  40 mg Subcutaneous Q24H  . feeding supplement  1 Container Oral TID BM  . gabapentin  100 mg Oral BID   . mupirocin ointment  1 application Nasal BID  . sertraline  100 mg Oral q morning - 10a   Continuous Infusions: . sodium chloride 1,000 mL (07/25/15 1014)    Active Problems:   Decubitus ulcer limited to breakdown of skin (stage 2)   Chronic pain syndrome   Chronic indwelling Foley catheter   Sepsis ([place])   Complicated urinary tract infection   Hypotension   AKI (acute kidney injury) ([place])   Gram-negative bacteremia ([place])    Katyana Trolinger, [facility] Pager [phone]. If 7PM-7AM, please contact night-coverage at [email or web address], password [facility] 07/25/2015, 5:25 PM  LOS: 3 days

## 2015-07-26 DIAGNOSIS — R7881 Bacteremia: Secondary | ICD-10-CM

## 2015-07-26 LAB — COMPREHENSIVE METABOLIC PANEL
ALK PHOS: 57 U/L (ref 38–126)
ALT: 11 U/L — AB (ref 17–63)
AST: 10 U/L — ABNORMAL LOW (ref 15–41)
Albumin: 2.7 g/dL — ABNORMAL LOW (ref 3.5–5.0)
Anion gap: 13 (ref 5–15)
BUN: 13 mg/dL (ref 6–20)
CALCIUM: 9 mg/dL (ref 8.9–10.3)
CHLORIDE: 110 mmol/L (ref 101–111)
CO2: 26 mmol/L (ref 22–32)
CREATININE: 0.61 mg/dL (ref 0.61–1.24)
Glucose, Bld: 95 mg/dL (ref 65–99)
Potassium: 2.6 mmol/L — CL (ref 3.5–5.1)
Sodium: 149 mmol/L — ABNORMAL HIGH (ref 135–145)
Total Bilirubin: 0.8 mg/dL (ref 0.3–1.2)
Total Protein: 7.1 g/dL (ref 6.5–8.1)

## 2015-07-26 LAB — CBC
HCT: 31.5 % — ABNORMAL LOW (ref 39.0–52.0)
Hemoglobin: 10 g/dL — ABNORMAL LOW (ref 13.0–17.0)
MCH: 28.2 pg (ref 26.0–34.0)
MCHC: 31.7 g/dL (ref 30.0–36.0)
MCV: 88.7 fL (ref 78.0–100.0)
PLATELETS: 250 10*3/uL (ref 150–400)
RBC: 3.55 MIL/uL — AB (ref 4.22–5.81)
RDW: 15.6 % — ABNORMAL HIGH (ref 11.5–15.5)
WBC: 16.1 10*3/uL — AB (ref 4.0–10.5)

## 2015-07-26 MED ORDER — CEFAZOLIN SODIUM-DEXTROSE 2-3 GM-% IV SOLR
2.0000 g | Freq: Three times a day (TID) | INTRAVENOUS | Status: DC
  Administered 2015-07-26 – 2015-07-31 (×15): 2 g via INTRAVENOUS
  Filled 2015-07-26 (×15): qty 50

## 2015-07-26 MED ORDER — LACTULOSE 10 GM/15ML PO SOLN
30.0000 g | ORAL | Status: AC
  Administered 2015-07-26: 30 g via ORAL
  Filled 2015-07-26: qty 60

## 2015-07-26 MED ORDER — POTASSIUM CHLORIDE 10 MEQ/100ML IV SOLN
10.0000 meq | INTRAVENOUS | Status: AC
  Administered 2015-07-26 (×5): 10 meq via INTRAVENOUS
  Filled 2015-07-26 (×3): qty 100

## 2015-07-26 MED ORDER — BISACODYL 10 MG RE SUPP
10.0000 mg | Freq: Once | RECTAL | Status: AC
  Administered 2015-07-26: 10 mg via RECTAL
  Filled 2015-07-26: qty 1

## 2015-07-26 MED ORDER — POTASSIUM CHLORIDE CRYS ER 20 MEQ PO TBCR
40.0000 meq | EXTENDED_RELEASE_TABLET | Freq: Two times a day (BID) | ORAL | Status: DC
  Administered 2015-07-26: 40 meq via ORAL
  Filled 2015-07-26: qty 2

## 2015-07-26 NOTE — Progress Notes (Signed)
Pharmacy Antibiotic Follow-up Note  Joshua Park is a 46 y.o. year-old male admitted on 07/22/2015.  The patient is currently on day 5 of cefepime for urosepsis and recurrent proteus mirabilis bacteremia.  WBC improving, no fevers  Assessment/Plan:  Cefepime 1gm q8h remains dose appropriately. Discussed with MD 1/18 re: narrowing antibiotics based on culture results and susceptibilities.  After physician evaluated patient, it was felt that he was still too ill to narrow.    Once appropriate to narrow antibiotics, consider IV ampicillin or cefazolin.  If oral appropriate, consider cefuroxime or amoxicillin/clavulonate but he was placed on amox/clav at discharge in December  Consider ID consult as appropriate due to recurrent infection/bacteremia with same organism in patient with indwelling urethral catheter and chronic pressure ulcers  Temp (24hrs), Avg:98.6 F (37 C), Min:98.4 F (36.9 C), Max:98.8 F (37.1 C)   Recent Labs Lab 07/22/15 1836 07/23/15 0340 07/24/15 0340 07/25/15 0453 07/26/15 0445  WBC 21.8* 17.5* 19.1* 18.6* 16.1*    Recent Labs Lab 07/22/15 1836 07/23/15 0340 07/24/15 0340 07/25/15 0453 07/26/15 0445  CREATININE 1.45* 1.02 0.61 0.58* 0.61   Estimated Creatinine Clearance: 104.7 mL/min (by C-G formula based on Cr of 0.61).    No Known Allergies  Antimicrobials this admission: 1/15 >> Cefepime  Levels/dose changes this admission: 1/16: Cefepime dose increased to 1gm IV q8h for resolution of AKI  Microbiology results: 1/15 BCx x2: Proteus mirabilis (Resistant to ciprofloxacin and TMP/SMZ) 1/15 UCx: Multiple species 1/15 MRSA PCR: positive  12/21 UCx: >100K proteus (Resistant to ciprofloxacin, nitrofurantoin, TMP/SMZ); > 100K enterococcus (pan sensitive) 12/21 BCx: p. Mirabilis (Resistant to ciprofloxacin and TMP/SMZ)  Thank you for allowing pharmacy to be a part of this patient's care.  Carolin Coy PharmD 07/26/2015 9:04 AM  Doreene Eland, PharmD, BCPS.   Pager: [phone] 07/26/2015 1:42 PM

## 2015-07-26 NOTE — Plan of Care (Signed)
Problem: Safety: Goal: Ability to remain free from injury will improve Outcome: Completed/Met Date Met:  07/26/15 n/a

## 2015-07-26 NOTE — Progress Notes (Addendum)
[name] PROGRESS NOTE  Joshua Park [id number] DOB: 1970/08/02 DOA: 07/22/2015 PCP: Gildardo Cranker, DO  HPI/Brief narrative [phone] who presented to ED via EMS from [facility] after reports of change in mental status, found to be septic due to a UTI.   Assessment/Plan: Proteus UTI related to chronic catheter with Severe sepsis present on admission  - Pt with renal failure and hypotension - improved with hydration, continue antibiotics - lactic acid normalized with IV fluids - leukocytosis still elevated at 19K, improved to 16K - Hypotension resolved - Proteus noted on blood cx. Plan to narrow abx with cefepime  Anemia - due to chronic illness / acute sepsis - no bleeding, monitor CBC - Hemoglobin this morning at 10  Proteus bacteremia and uti related to chronic catheter - on Cefepime, narrow to ancef. If/when able to reliably tolerate PO, would then transition to PO abx  UTI  - continue Cefepime. Cultures pos for Proteus - abx per above  Neurogenic bladder  - foley was changed in ED   AKI  - 2/2 obstructed foley vs sepsis - Renal function improved nicely with hydration  Constipation - Large stool noted on imaging - Continue cathartics as tolerated to promote BM  Hypokalemia - Will replace  Code Status: Full Family Communication: Pt in room Disposition Plan: D/c when off IV meds   Consultants:    Procedures:    Antibiotics: Anti-infectives    Start     Dose/Rate Route Frequency Ordered Stop   07/23/15 1800  ceFEPIme (MAXIPIME) 1 g in dextrose 5 % 50 mL IVPB  Status:  Discontinued     1 g 100 mL/hr over 30 Minutes Intravenous Every 8 hours 07/23/15 1617 07/26/15 1526   07/23/15 1000  ceFEPIme (MAXIPIME) 1 g in dextrose 5 % 50 mL IVPB  Status:  Discontinued     1 g 100 mL/hr over 30 Minutes Intravenous Every 24 hours 07/22/15 1209 07/23/15 1617   07/22/15 0930  ceFEPIme (MAXIPIME) 2 g in dextrose 5 % 50 mL IVPB     2 g 100 mL/hr over  30 Minutes Intravenous  Once 07/22/15 0927 07/22/15 1050      HPI/Subjective: Still feels abd fullness and nausea  Objective: Filed Vitals:   07/25/15 0731 07/25/15 1551 07/25/15 2215 07/26/15 0708  BP: 168/88 161/82 176/90 167/93  Pulse: 75 68 81 76  Temp: 98.3 F (36.8 C) 98.8 F (37.1 C) 98.4 F (36.9 C) 98.7 F (37.1 C)  TempSrc: Oral Oral Oral Oral  Resp: 20 20 18 18   Height:      Weight:      SpO2: 100% 97% 100% 100%    Intake/Output Summary (Last 24 hours) at 07/26/15 1529 Last data filed at 07/26/15 0709  Gross per 24 hour  Intake    500 ml  Output   3200 ml  Net  -2700 ml   Filed Weights   07/22/15 1800 07/23/15 0500 07/24/15 0350  Weight: 61.7 kg (136 lb 0.4 oz) 65.2 kg (143 lb 11.8 oz) 62.8 kg (138 lb 7.2 oz)    Exam:   General:  Awake, in nad currently  Cardiovascular: regular, s1, s2  Respiratory: normal resp effort, no wheezing  Abdomen: generally tender, distended, decreased BS  Musculoskeletal: perfused, no clubbing, no cyanosis  Data Reviewed: Basic Metabolic Panel:  Recent Labs Lab 07/22/15 1002 07/22/15 1836 07/23/15 0340 07/24/15 0340 07/25/15 0453 07/26/15 0445  NA 142  --  144 142 146* 149*  K 3.8  --  3.6 3.2* 3.2* 2.6*  CL 105  --  112* 108 113* 110  CO2 23  --  22 22 23 26   GLUCOSE 167*  --  122* 121* 99 95  BUN 36*  --  23* 14 15 13   CREATININE 3.76* 1.45* 1.02 0.61 0.58* 0.61  CALCIUM 8.8*  --  8.4* 8.6* 9.0 9.0   Liver Function Tests:  Recent Labs Lab 07/22/15 1002 07/26/15 0445  AST 23 10*  ALT 11* 11*  ALKPHOS 61 57  BILITOT 0.6 0.8  PROT 7.2 7.1  ALBUMIN 3.1* 2.7*   No results for input(s): LIPASE, AMYLASE in the last 168 hours. No results for input(s): AMMONIA in the last 168 hours. CBC:  Recent Labs Lab 07/22/15 1002 07/22/15 1836 07/23/15 0340 07/24/15 0340 07/25/15 0453 07/26/15 0445  WBC 21.6* 21.8* 17.5* 19.1* 18.6* 16.1*  NEUTROABS 19.9*  --   --   --   --   --   HGB 11.0* 10.4* 9.8*  10.4* 9.5* 10.0*  HCT 34.7* 32.1* 30.6* 31.7* 30.0* 31.5*  MCV 88.3 87.0 88.4 88.5 89.6 88.7  PLT 259 202 198 213 213 250   Cardiac Enzymes: No results for input(s): CKTOTAL, CKMB, CKMBINDEX, TROPONINI in the last 168 hours. BNP (last 3 results)  Recent Labs  09/04/14 0127 12/11/14 1202  BNP 9.5 2.3    ProBNP (last 3 results) No results for input(s): PROBNP in the last 8760 hours.  CBG: No results for input(s): GLUCAP in the last 168 hours.  Recent Results (from the past 240 hour(s))  Urine culture     Status: None   Collection Time: 07/22/15  9:47 AM  Result Value Ref Range Status   Specimen Description URINE, CATHETERIZED  Final   Special Requests NONE  Final   Culture   Final    MULTIPLE SPECIES PRESENT, SUGGEST RECOLLECTION Performed at [facility]    Report Status 07/24/2015 FINAL  Final  Blood Culture (routine x 2)     Status: None   Collection Time: 07/22/15  9:50 AM  Result Value Ref Range Status   Specimen Description BLOOD LEFT ANTECUBITAL  Final   Special Requests BOTTLES DRAWN AEROBIC AND ANAEROBIC 5CC  Final   Culture  Setup Time   Final    GRAM NEGATIVE RODS IN BOTH AEROBIC AND ANAEROBIC BOTTLES CRITICAL RESULT CALLED TO, READ BACK BY AND VERIFIED WITH: Luciano Cutter [id number] [place]    Culture   Final    PROTEUS MIRABILIS Performed at [facility]    Report Status 07/25/2015 FINAL  Final   Organism ID, Bacteria PROTEUS MIRABILIS  Final      Susceptibility   Proteus mirabilis - MIC*    AMPICILLIN <=2 SENSITIVE Sensitive     CEFAZOLIN <=4 SENSITIVE Sensitive     CEFEPIME <=1 SENSITIVE Sensitive     CEFTAZIDIME <=1 SENSITIVE Sensitive     CEFTRIAXONE <=1 SENSITIVE Sensitive     CIPROFLOXACIN >=4 RESISTANT Resistant     GENTAMICIN <=1 SENSITIVE Sensitive     IMIPENEM 4 SENSITIVE Sensitive     TRIMETH/SULFA >=320 RESISTANT Resistant     AMPICILLIN/SULBACTAM <=2 SENSITIVE Sensitive     PIP/TAZO <=4 SENSITIVE Sensitive     *  PROTEUS MIRABILIS  Blood Culture (routine x 2)     Status: None   Collection Time: 07/22/15 10:00 AM  Result Value Ref Range Status   Specimen Description BLOOD LEFT ARM  Final   Special Requests BOTTLES DRAWN AEROBIC  AND ANAEROBIC 5CC  Final   Culture  Setup Time   Final    GRAM NEGATIVE RODS IN BOTH AEROBIC AND ANAEROBIC BOTTLES CRITICAL RESULT CALLED TO, READ BACK BY AND VERIFIED WITH: Luciano Cutter [id number] [place]    Culture   Final    PROTEUS MIRABILIS SUSCEPTIBILITIES PERFORMED ON PREVIOUS CULTURE WITHIN THE LAST 5 DAYS. Performed at [facility]    Report Status 07/25/2015 FINAL  Final  MRSA PCR Screening     Status: Abnormal   Collection Time: 07/22/15  6:10 PM  Result Value Ref Range Status   MRSA by PCR POSITIVE (A) NEGATIVE Final    Comment:        The GeneXpert MRSA Assay (FDA approved for NASAL specimens only), is one component of a comprehensive MRSA colonization surveillance program. It is not intended to diagnose MRSA infection nor to guide or monitor treatment for MRSA infections. 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 Initial encounter. EXAM: PORTABLE ABDOMEN - 1 VIEW COMPARISON:  CT Abdomen and Pelvis 06/27/2015 and earlier. FINDINGS: Portable AP supine view at 1524 hours. Stable visualized osseous structures. IVC filter remains in place. Urinary versus rectal catheter projects over the pelvis. Non obstructed bowel gas pattern. Moderate to large volume of retained stool in the colon, stable to mildly increased overall compared to the prior CT. Stable visualized lung bases. No definite pneumoperitoneum on these supine views. IMPRESSION: Nonobstructed bowel-gas pattern with moderate to large volume of retained stool in a colon which appears mildly increased compared to the recent CT Abdomen and  Pelvis. Electronically Signed   By: Genevie Ann M.D.   On: 07/25/2015 15:48    Scheduled Meds: . baclofen  10 mg Oral BID  . bisacodyl  10 mg Rectal Once  . Chlorhexidine Gluconate Cloth  6 each Topical Q0600  . enoxaparin (LOVENOX) injection  40 mg Subcutaneous Q24H  . feeding supplement  1 Container Oral TID BM  . gabapentin  100 mg Oral BID  . lactulose  30 g Oral NOW  . mupirocin ointment  1 application Nasal BID  . potassium chloride  10 mEq Intravenous Q1 Hr x 5  . sertraline  100 mg Oral q morning - 10a   Continuous Infusions: . sodium chloride 1,000 mL (07/25/15 1823)    Active Problems:   Decubitus ulcer limited to breakdown of skin (stage 2)   Chronic pain syndrome   Chronic indwelling Foley catheter   Sepsis ([place])   Complicated urinary tract infection   Hypotension   AKI (acute kidney injury) ([place])   Gram-negative bacteremia ([place])    [facility], [place] [facility] Pager [phone]. If 7PM-7AM, please contact night-coverage at [email or web address], password [facility] 07/26/2015, 3:29 PM  LOS: 4 days

## 2015-07-26 NOTE — Progress Notes (Signed)
Pharmacy Antibiotic Follow-up Note  Joshua Park is a 46 y.o. year-old male admitted on 07/22/2015.  The patient is currently on day 5 of cefepime for urosepsis and recurrent proteus mirabilis bacteremia to now transition to IV Ancef.  WBC improving, no fevers  Assessment/Plan:  Start Ancef 2g IV q8 per current renal function and bacteremia indication  Consider ID consult as appropriate due to recurrent infection/bacteremia with same organism in patient with indwelling urethral catheter and chronic pressure ulcers  Temp (24hrs), Avg:98.6 F (37 C), Min:98.4 F (36.9 C), Max:98.8 F (37.1 C)   Recent Labs Lab 07/22/15 1836 07/23/15 0340 07/24/15 0340 07/25/15 0453 07/26/15 0445  WBC 21.8* 17.5* 19.1* 18.6* 16.1*     Recent Labs Lab 07/22/15 1836 07/23/15 0340 07/24/15 0340 07/25/15 0453 07/26/15 0445  CREATININE 1.45* 1.02 0.61 0.58* 0.61   Estimated Creatinine Clearance: 104.7 mL/min (by C-G formula based on Cr of 0.61).    No Known Allergies  Antimicrobials this admission: 1/15 >> Cefepime  Levels/dose changes this admission: 1/16: Cefepime dose increased to 1gm IV q8h for resolution of AKI  Microbiology results: 1/15 BCx x2: Proteus mirabilis (Resistant to ciprofloxacin and TMP/SMZ) 1/15 UCx: Multiple species 1/15 MRSA PCR: positive  12/21 UCx: >100K proteus (Resistant to ciprofloxacin, nitrofurantoin, TMP/SMZ); > 100K enterococcus (pan sensitive) 12/21 BCx: p. Mirabilis (Resistant to ciprofloxacin and TMP/SMZ)   Adrian Saran, PharmD, BCPS Pager [phone] 07/26/2015 3:35 PM

## 2015-07-27 LAB — BASIC METABOLIC PANEL
ANION GAP: 13 (ref 5–15)
ANION GAP: 11 (ref 5–15)
BUN: 7 mg/dL (ref 6–20)
BUN: 8 mg/dL (ref 6–20)
CHLORIDE: 103 mmol/L (ref 101–111)
CHLORIDE: 101 mmol/L (ref 101–111)
CO2: 29 mmol/L (ref 22–32)
CO2: 32 mmol/L (ref 22–32)
Calcium: 8.7 mg/dL — ABNORMAL LOW (ref 8.9–10.3)
Calcium: 8.8 mg/dL — ABNORMAL LOW (ref 8.9–10.3)
Creatinine, Ser: 0.52 mg/dL — ABNORMAL LOW (ref 0.61–1.24)
Creatinine, Ser: 0.47 mg/dL — ABNORMAL LOW (ref 0.61–1.24)
GFR calc Af Amer: >60 mL/min (ref >60)
GFR calc Af Amer: >60 mL/min (ref >60)
GFR calc non Af Amer: >60 mL/min (ref >60)
GLUCOSE: 93 mg/dL (ref 65–99)
GLUCOSE: 90 mg/dL (ref 65–99)
POTASSIUM: 2.7 mmol/L — AB (ref 3.5–5.1)
Potassium: 2.7 mmol/L — CL (ref 3.5–5.1)
SODIUM: 144 mmol/L (ref 135–145)
Sodium: 145 mmol/L (ref 135–145)

## 2015-07-27 LAB — MAGNESIUM
Magnesium: 1.4 mg/dL — ABNORMAL LOW (ref 1.7–2.4)
Magnesium: 1.8 mg/dL (ref 1.7–2.4)

## 2015-07-27 LAB — CBC
HCT: 30.5 % — ABNORMAL LOW (ref 39.0–52.0)
HEMOGLOBIN: 9.7 g/dL — AB (ref 13.0–17.0)
MCH: 28.3 pg (ref 26.0–34.0)
MCHC: 31.8 g/dL (ref 30.0–36.0)
MCV: 88.9 fL (ref 78.0–100.0)
Platelets: 260 10*3/uL (ref 150–400)
RBC: 3.43 MIL/uL — ABNORMAL LOW (ref 4.22–5.81)
RDW: 15.3 % (ref 11.5–15.5)
WBC: 14.5 10*3/uL — ABNORMAL HIGH (ref 4.0–10.5)

## 2015-07-27 MED ORDER — POTASSIUM CHLORIDE 10 MEQ/100ML IV SOLN
INTRAVENOUS | Status: AC
  Filled 2015-07-27: qty 100

## 2015-07-27 MED ORDER — LACTULOSE 10 GM/15ML PO SOLN
30.0000 g | ORAL | Status: AC
  Administered 2015-07-27: 30 g via ORAL
  Filled 2015-07-27: qty 60

## 2015-07-27 MED ORDER — MAGNESIUM SULFATE 2 GM/50ML IV SOLN
2.0000 g | Freq: Once | INTRAVENOUS | Status: AC
  Administered 2015-07-27: 2 g via INTRAVENOUS
  Filled 2015-07-27: qty 50

## 2015-07-27 MED ORDER — POTASSIUM CHLORIDE 10 MEQ/100ML IV SOLN
10.0000 meq | INTRAVENOUS | Status: AC
  Administered 2015-07-27 (×4): 10 meq via INTRAVENOUS
  Filled 2015-07-27 (×4): qty 100

## 2015-07-27 MED ORDER — SORBITOL 70 % SOLN
30.0000 mL | Status: AC
  Administered 2015-07-27: 30 mL via ORAL
  Filled 2015-07-27: qty 30

## 2015-07-27 MED ORDER — POTASSIUM CHLORIDE 10 MEQ/100ML IV SOLN
10.0000 meq | INTRAVENOUS | Status: AC
  Administered 2015-07-27 (×5): 10 meq via INTRAVENOUS
  Filled 2015-07-27 (×4): qty 100

## 2015-07-27 MED ORDER — BISACODYL 10 MG RE SUPP
10.0000 mg | Freq: Once | RECTAL | Status: AC
  Administered 2015-07-27: 10 mg via RECTAL
  Filled 2015-07-27: qty 1

## 2015-07-27 NOTE — Progress Notes (Addendum)
[name] PROGRESS NOTE  DRISTIN DEGON [id number] DOB: 1970-07-11 DOA: 07/22/2015 PCP: Gildardo Cranker, DO  HPI/Brief narrative [phone] who presented to ED via EMS from [facility] after reports of change in mental status, found to be septic due to a UTI.   Assessment/Plan: Proteus UTI related to chronic catheter with Severe sepsis present on admission  - Pt with renal failure and hypotension - improved with hydration, continue antibiotics - lactic acid normalized with IV fluids - leukocytosis improving with abx - Hypotension resolved - Proteus noted on blood cx. Narrowed abx to ancef  Anemia - due to chronic illness / acute sepsis - no bleeding, monitor CBC - Hemoglobin this morning at 10  Proteus bacteremia and uti related to chronic catheter - Initially was on Cefepime, narrowed to ancef. If/when able to reliably tolerate PO, would then transition to PO abx. Pt still with nausea and decreased ability to tolerate PO (see below)  UTI  - Cultures pos for Proteus - abx per above  Neurogenic bladder  - foley was changed in ED   AKI  - 2/2 obstructed foley vs sepsis - Renal function improved nicely with hydration  Constipation - Large stool noted on recent imaging that appeared worse compared to CT findings on 12/21 - Continue cathartics as tolerated to promote BM  Hypokalemia - Will replace  Hypomagnesemia - Replace  Code Status: Full Family Communication: Pt in room Disposition Plan: D/c when off IV meds and when pt can reliably tolerate PO, hopefully over weekend   Consultants:    Procedures:    Antibiotics: Anti-infectives    Start     Dose/Rate Route Frequency Ordered Stop   07/26/15 1800  ceFAZolin (ANCEF) IVPB 2 g/50 mL premix     2 g 100 mL/hr over 30 Minutes Intravenous Every 8 hours 07/26/15 1539     07/23/15 1800  ceFEPIme (MAXIPIME) 1 g in dextrose 5 % 50 mL IVPB  Status:  Discontinued     1 g 100 mL/hr over 30 Minutes  Intravenous Every 8 hours 07/23/15 1617 07/26/15 1526   07/23/15 1000  ceFEPIme (MAXIPIME) 1 g in dextrose 5 % 50 mL IVPB  Status:  Discontinued     1 g 100 mL/hr over 30 Minutes Intravenous Every 24 hours 07/22/15 1209 07/23/15 1617   07/22/15 0930  ceFEPIme (MAXIPIME) 2 g in dextrose 5 % 50 mL IVPB     2 g 100 mL/hr over 30 Minutes Intravenous  Once 07/22/15 0927 07/22/15 1050      HPI/Subjective: Per staff, large BM noted this AM. Pt still reports abd fullness/discomfort  Objective: Filed Vitals:   07/26/15 1556 07/26/15 2205 07/27/15 0601 07/27/15 1419  BP: 171/88 160/98 159/93 157/89  Pulse: 80 75 73 78  Temp: 99.1 F (37.3 C) 98.4 F (36.9 C) 98.4 F (36.9 C) 98.4 F (36.9 C)  TempSrc: Oral Oral Oral Oral  Resp: 20 20 20 20   Height:      Weight:      SpO2: 100% 100% 100% 100%    Intake/Output Summary (Last 24 hours) at 07/27/15 1459 Last data filed at 07/27/15 1422  Gross per 24 hour  Intake   2040 ml  Output   7500 ml  Net  -5460 ml   Filed Weights   07/22/15 1800 07/23/15 0500 07/24/15 0350  Weight: 61.7 kg (136 lb 0.4 oz) 65.2 kg (143 lb 11.8 oz) 62.8 kg (138 lb 7.2 oz)    Exam:   General:  Awake, in nad currently, laying in bed  Cardiovascular: regular, s1, s2  Respiratory: normal resp effort, no wheezing  Abdomen: generally tender, distended, decreased BS  Musculoskeletal: perfused, no cyanosis  Data Reviewed: Basic Metabolic Panel:  Recent Labs Lab 07/23/15 0340 07/24/15 0340 07/25/15 0453 07/26/15 0445 07/27/15 0440  NA 144 142 146* 149* 145  K 3.6 3.2* 3.2* 2.6* 2.7*  CL 112* 108 113* 110 103  CO2 22 22 23 26 29   GLUCOSE 122* 121* 99 95 93  BUN 23* 14 15 13 7   CREATININE 1.02 0.61 0.58* 0.61 0.52*  CALCIUM 8.4* 8.6* 9.0 9.0 8.7*  MG  --   --   --   --  1.4*   Liver Function Tests:  Recent Labs Lab 07/22/15 1002 07/26/15 0445  AST 23 10*  ALT 11* 11*  ALKPHOS 61 57  BILITOT 0.6 0.8  PROT 7.2 7.1  ALBUMIN 3.1* 2.7*    No results for input(s): LIPASE, AMYLASE in the last 168 hours. No results for input(s): AMMONIA in the last 168 hours. CBC:  Recent Labs Lab 07/22/15 1002  07/23/15 0340 07/24/15 0340 07/25/15 0453 07/26/15 0445 07/27/15 0440  WBC 21.6*  < > 17.5* 19.1* 18.6* 16.1* 14.5*  NEUTROABS 19.9*  --   --   --   --   --   --   HGB 11.0*  < > 9.8* 10.4* 9.5* 10.0* 9.7*  HCT 34.7*  < > 30.6* 31.7* 30.0* 31.5* 30.5*  MCV 88.3  < > 88.4 88.5 89.6 88.7 88.9  PLT 259  < > 198 213 213 250 260  < > = values in this interval not displayed. Cardiac Enzymes: No results for input(s): CKTOTAL, CKMB, CKMBINDEX, TROPONINI in the last 168 hours. BNP (last 3 results)  Recent Labs  09/04/14 0127 12/11/14 1202  BNP 9.5 2.3    ProBNP (last 3 results) No results for input(s): PROBNP in the last 8760 hours.  CBG: No results for input(s): GLUCAP in the last 168 hours.  Recent Results (from the past 240 hour(s))  Urine culture     Status: None   Collection Time: 07/22/15  9:47 AM  Result Value Ref Range Status   Specimen Description URINE, CATHETERIZED  Final   Special Requests NONE  Final   Culture   Final    MULTIPLE SPECIES PRESENT, SUGGEST RECOLLECTION Performed at [facility]    Report Status 07/24/2015 FINAL  Final  Blood Culture (routine x 2)     Status: None   Collection Time: 07/22/15  9:50 AM  Result Value Ref Range Status   Specimen Description BLOOD LEFT ANTECUBITAL  Final   Special Requests BOTTLES DRAWN AEROBIC AND ANAEROBIC 5CC  Final   Culture  Setup Time   Final    GRAM NEGATIVE RODS IN BOTH AEROBIC AND ANAEROBIC BOTTLES CRITICAL RESULT CALLED TO, READ BACK BY AND VERIFIED WITH: Luciano Cutter [id number] [place]    Culture   Final    PROTEUS MIRABILIS Performed at [facility]    Report Status 07/25/2015 FINAL  Final   Organism ID, Bacteria PROTEUS MIRABILIS  Final      Susceptibility   Proteus mirabilis - MIC*    AMPICILLIN <=2 SENSITIVE Sensitive      CEFAZOLIN <=4 SENSITIVE Sensitive     CEFEPIME <=1 SENSITIVE Sensitive     CEFTAZIDIME <=1 SENSITIVE Sensitive     CEFTRIAXONE <=1 SENSITIVE Sensitive     CIPROFLOXACIN >=4 RESISTANT Resistant  GENTAMICIN <=1 SENSITIVE Sensitive     IMIPENEM 4 SENSITIVE Sensitive     TRIMETH/SULFA >=320 RESISTANT Resistant     AMPICILLIN/SULBACTAM <=2 SENSITIVE Sensitive     PIP/TAZO <=4 SENSITIVE Sensitive     * PROTEUS MIRABILIS  Blood Culture (routine x 2)     Status: None   Collection Time: 07/22/15 10:00 AM  Result Value Ref Range Status   Specimen Description BLOOD LEFT ARM  Final   Special Requests BOTTLES DRAWN AEROBIC AND ANAEROBIC 5CC  Final   Culture  Setup Time   Final    GRAM NEGATIVE RODS IN BOTH AEROBIC AND ANAEROBIC BOTTLES CRITICAL RESULT CALLED TO, READ BACK BY AND VERIFIED WITH: Luciano Cutter [id number] [place]    Culture   Final    PROTEUS MIRABILIS SUSCEPTIBILITIES PERFORMED ON PREVIOUS CULTURE WITHIN THE LAST 5 DAYS. Performed at [facility]    Report Status 07/25/2015 FINAL  Final  MRSA PCR Screening     Status: Abnormal   Collection Time: 07/22/15  6:10 PM  Result Value Ref Range Status   MRSA by PCR POSITIVE (A) NEGATIVE Final    Comment:        The GeneXpert MRSA Assay (FDA approved for NASAL specimens only), is one component of a comprehensive MRSA colonization surveillance program. It is not intended to diagnose MRSA infection nor to guide or monitor treatment for MRSA infections. 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 Initial encounter. EXAM: PORTABLE ABDOMEN - 1 VIEW COMPARISON:  CT Abdomen and Pelvis 06/27/2015 and earlier. FINDINGS: Portable AP supine view at 1524 hours. Stable visualized osseous structures. IVC filter remains in place. Urinary versus rectal catheter projects over  the pelvis. Non obstructed bowel gas pattern. Moderate to large volume of retained stool in the colon, stable to mildly increased overall compared to the prior CT. Stable visualized lung bases. No definite pneumoperitoneum on these supine views. IMPRESSION: Nonobstructed bowel-gas pattern with moderate to large volume of retained stool in a colon which appears mildly increased compared to the recent CT Abdomen and Pelvis. Electronically Signed   By: Genevie Ann M.D.   On: 07/25/2015 15:48    Scheduled Meds: . baclofen  10 mg Oral BID  .  ceFAZolin (ANCEF) IV  2 g Intravenous Q8H  . Chlorhexidine Gluconate Cloth  6 each Topical Q0600  . enoxaparin (LOVENOX) injection  40 mg Subcutaneous Q24H  . feeding supplement  1 Container Oral TID BM  . gabapentin  100 mg Oral BID  . mupirocin ointment  1 application Nasal BID  . sertraline  100 mg Oral q morning - 10a   Continuous Infusions: . sodium chloride 1,000 mL (07/25/15 1823)    Active Problems:   Decubitus ulcer limited to breakdown of skin (stage 2)   Chronic pain syndrome   Chronic indwelling Foley catheter   Sepsis ([place])   Complicated urinary tract infection   Hypotension   AKI (acute kidney injury) ([place])   Gram-negative bacteremia ([place])    [place], [place] [facility] Pager [phone]. If 7PM-7AM, please contact night-coverage at [email or web address], password [facility] 07/27/2015, 2:59 PM  LOS: 5 days

## 2015-07-27 NOTE — Progress Notes (Signed)
CSW confirmed with patient that he plans to return to [facility] at discharge. CSW confirmed with Tammy at [facility] that they would be able to take patient back when ready & they would be able to take patient over the weekend if ready.   Please call weekend CSW (ph#: [phone]) to facilitate discharge.      Raynaldo Opitz, [facility] Clinical Social Worker cell #: [phone]
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
## 2015-07-27 NOTE — Progress Notes (Signed)
Lab called and stated K+ result is 2.7 for this am. SRP, RN

## 2015-07-27 NOTE — Progress Notes (Signed)
CRITICAL VALUE ALERT  Critical value received:  K 2.7  Date of notification:  07-27-15  Time of notification:  [id number]  Critical value read back: yes  Nurse who received alert:  Mannie Stabile  MD notified (1st page):  yes  Time of first page: 1547  MD notified (2nd page):  Time of second page:  Responding MD:  Dr. Wyline Copas  Time MD responded:

## 2015-07-28 LAB — TROPONIN I: Troponin I: <0.03 ng/mL (ref <0.031)

## 2015-07-28 LAB — BASIC METABOLIC PANEL
Anion gap: 9 (ref 5–15)
BUN: 7 mg/dL (ref 6–20)
CALCIUM: 8.3 mg/dL — AB (ref 8.9–10.3)
CO2: 32 mmol/L (ref 22–32)
CREATININE: 0.43 mg/dL — AB (ref 0.61–1.24)
Chloride: 99 mmol/L — ABNORMAL LOW (ref 101–111)
GFR calc Af Amer: >60 mL/min (ref >60)
GLUCOSE: 123 mg/dL — AB (ref 65–99)
POTASSIUM: 3.1 mmol/L — AB (ref 3.5–5.1)
SODIUM: 140 mmol/L (ref 135–145)

## 2015-07-28 LAB — CBC
HCT: 30.4 % — ABNORMAL LOW (ref 39.0–52.0)
HEMOGLOBIN: 9.9 g/dL — AB (ref 13.0–17.0)
MCH: 28.2 pg (ref 26.0–34.0)
MCHC: 32.6 g/dL (ref 30.0–36.0)
MCV: 86.6 fL (ref 78.0–100.0)
PLATELETS: 301 10*3/uL (ref 150–400)
RBC: 3.51 MIL/uL — ABNORMAL LOW (ref 4.22–5.81)
RDW: 15.1 % (ref 11.5–15.5)
WBC: 11.6 10*3/uL — ABNORMAL HIGH (ref 4.0–10.5)

## 2015-07-28 LAB — MAGNESIUM: MAGNESIUM: 1.9 mg/dL (ref 1.7–2.4)

## 2015-07-28 MED ORDER — PEG-KCL-NACL-NASULF-NA ASC-C 100 G PO SOLR
1.0000 | Freq: Once | ORAL | Status: AC
  Administered 2015-07-28: 200 g via ORAL
  Filled 2015-07-28: qty 1

## 2015-07-28 MED ORDER — POTASSIUM CHLORIDE 10 MEQ/100ML IV SOLN
10.0000 meq | INTRAVENOUS | Status: AC
  Administered 2015-07-28 (×5): 10 meq via INTRAVENOUS
  Filled 2015-07-28 (×3): qty 100

## 2015-07-28 NOTE — Progress Notes (Signed)
[name] PROGRESS NOTE  Joshua Park [id number] DOB: 20-Aug-1970 DOA: 07/22/2015 PCP: Gildardo Cranker, DO  HPI/Brief narrative [phone] who presented to ED via EMS from [facility] after reports of change in mental status, found to be septic due to a UTI.   Assessment/Plan: Proteus UTI related to chronic catheter with Severe sepsis present on admission  - Pt with renal failure and hypotension - Improved with hydration, continue antibiotics - lactic acid normalized with IV fluids - leukocytosis improving with abx - Hypotension resolved - Proteus noted on blood cx. Narrowed abx to ancef. Hopefully able to transition to PO abx when pt is better able to tolerate PO intake (see below)  Anemia - due to chronic illness / acute sepsis - no bleeding, monitor CBC - Hemoglobin this morning at 10  Proteus bacteremia and uti related to chronic catheter - Initially was on Cefepime, narrowed to ancef. If/when able to reliably tolerate PO, would then transition to PO abx. Pt still with nausea and decreased ability to tolerate PO (see below)  UTI  - Cultures pos for Proteus - abx per above  Neurogenic bladder  - foley was changed in ED   AKI  - 2/2 obstructed foley vs sepsis - Renal function improved nicely with hydration  Constipation - Large stool noted on recent imaging that appeared worse compared to CT findings on 12/21 - No BM despite multiple cathartics given recently - Pt still distended and feeling uncomfortable. Will give trial of Movi bowel prep  Hypokalemia - Will replace  Hypomagnesemia - Replaced  Code Status: Full Family Communication: Pt in room Disposition Plan: D/c when off IV meds and when pt can reliably tolerate PO, hopefully next 1-2 days   Consultants:    Procedures:    Antibiotics: Anti-infectives    Start     Dose/Rate Route Frequency Ordered Stop   07/26/15 1800  ceFAZolin (ANCEF) IVPB 2 g/50 mL premix     2 g 100 mL/hr over 30  Minutes Intravenous Every 8 hours 07/26/15 1539     07/23/15 1800  ceFEPIme (MAXIPIME) 1 g in dextrose 5 % 50 mL IVPB  Status:  Discontinued     1 g 100 mL/hr over 30 Minutes Intravenous Every 8 hours 07/23/15 1617 07/26/15 1526   07/23/15 1000  ceFEPIme (MAXIPIME) 1 g in dextrose 5 % 50 mL IVPB  Status:  Discontinued     1 g 100 mL/hr over 30 Minutes Intravenous Every 24 hours 07/22/15 1209 07/23/15 1617   07/22/15 0930  ceFEPIme (MAXIPIME) 2 g in dextrose 5 % 50 mL IVPB     2 g 100 mL/hr over 30 Minutes Intravenous  Once 07/22/15 0927 07/22/15 1050      HPI/Subjective: Pt still feeling uncomfortable, distended abd but eager to try to eat  Objective: Filed Vitals:   07/27/15 0601 07/27/15 1419 07/27/15 2323 07/28/15 0622  BP: 159/93 157/89 138/84 143/87  Pulse: 73 78 77 73  Temp: 98.4 F (36.9 C) 98.4 F (36.9 C) 97.9 F (36.6 C) 98.3 F (36.8 C)  TempSrc: Oral Oral Axillary Axillary  Resp: '20 20 18 16  ' Height:      Weight:      SpO2: 100% 100% 97% 100%    Intake/Output Summary (Last 24 hours) at 07/28/15 1644 Last data filed at 07/28/15 1400  Gross per 24 hour  Intake    240 ml  Output   4300 ml  Net  -4060 ml   [employer]  07/22/15 1800 07/23/15 0500 07/24/15 0350  Weight: 61.7 kg (136 lb 0.4 oz) 65.2 kg (143 lb 11.8 oz) 62.8 kg (138 lb 7.2 oz)    Exam:   General:  Awake, appears uncomfortable, currently, laying in bed  Cardiovascular: regular, s1, s2  Respiratory: normal resp effort, no wheezing  Abdomen: generally tender, distended, decreased BS  Musculoskeletal: perfused, no cyanosis  Data Reviewed: Basic Metabolic Panel:  Recent Labs Lab 07/25/15 0453 07/26/15 0445 07/27/15 0440 07/27/15 1506 07/28/15 0542  NA 146* 149* 145 144 140  K 3.2* 2.6* 2.7* 2.7* 3.1*  CL 113* 110 103 101 99*  CO2 '23 26 29 ' 32 32  GLUCOSE 99 95 93 90 123*  BUN '15 13 7 8 7  ' CREATININE 0.58* 0.61 0.52* 0.47* 0.43*  CALCIUM 9.0 9.0 8.7* 8.8* 8.3*  MG  --   --   1.4* 1.8 1.9   Liver Function Tests:  Recent Labs Lab 07/22/15 1002 07/26/15 0445  AST 23 10*  ALT 11* 11*  ALKPHOS 61 57  BILITOT 0.6 0.8  PROT 7.2 7.1  ALBUMIN 3.1* 2.7*   No results for input(s): LIPASE, AMYLASE in the last 168 hours. No results for input(s): AMMONIA in the last 168 hours. CBC:  Recent Labs Lab 07/22/15 1002  07/24/15 0340 07/25/15 0453 07/26/15 0445 07/27/15 0440 07/28/15 0542  WBC 21.6*  < > 19.1* 18.6* 16.1* 14.5* 11.6*  NEUTROABS 19.9*  --   --   --   --   --   --   HGB 11.0*  < > 10.4* 9.5* 10.0* 9.7* 9.9*  HCT 34.7*  < > 31.7* 30.0* 31.5* 30.5* 30.4*  MCV 88.3  < > 88.5 89.6 88.7 88.9 86.6  PLT 259  < > 213 213 250 260 301  < > = values in this interval not displayed. Cardiac Enzymes:  Recent Labs Lab 07/28/15 0906 07/28/15 1459  TROPONINI <0.03 <0.03   BNP (last 3 results)  Recent Labs  09/04/14 0127 12/11/14 1202  BNP 9.5 2.3    ProBNP (last 3 results) No results for input(s): PROBNP in the last 8760 hours.  CBG: No results for input(s): GLUCAP in the last 168 hours.  Recent Results (from the past 240 hour(s))  Urine culture     Status: None   Collection Time: 07/22/15  9:47 AM  Result Value Ref Range Status   Specimen Description URINE, CATHETERIZED  Final   Special Requests NONE  Final   Culture   Final    MULTIPLE SPECIES PRESENT, SUGGEST RECOLLECTION Performed at [facility]    Report Status 07/24/2015 FINAL  Final  Blood Culture (routine x 2)     Status: None   Collection Time: 07/22/15  9:50 AM  Result Value Ref Range Status   Specimen Description BLOOD LEFT ANTECUBITAL  Final   Special Requests BOTTLES DRAWN AEROBIC AND ANAEROBIC 5CC  Final   Culture  Setup Time   Final    GRAM NEGATIVE RODS IN BOTH AEROBIC AND ANAEROBIC BOTTLES CRITICAL RESULT CALLED TO, READ BACK BY AND VERIFIED WITH: Luciano Cutter [id number] [place]    Culture   Final    PROTEUS MIRABILIS Performed at [facility]     Report Status 07/25/2015 FINAL  Final   Organism ID, Bacteria PROTEUS MIRABILIS  Final      Susceptibility   Proteus mirabilis - MIC*    AMPICILLIN <=2 SENSITIVE Sensitive     CEFAZOLIN <=4 SENSITIVE Sensitive  CEFEPIME <=1 SENSITIVE Sensitive     CEFTAZIDIME <=1 SENSITIVE Sensitive     CEFTRIAXONE <=1 SENSITIVE Sensitive     CIPROFLOXACIN >=4 RESISTANT Resistant     GENTAMICIN <=1 SENSITIVE Sensitive     IMIPENEM 4 SENSITIVE Sensitive     TRIMETH/SULFA >=320 RESISTANT Resistant     AMPICILLIN/SULBACTAM <=2 SENSITIVE Sensitive     PIP/TAZO <=4 SENSITIVE Sensitive     * PROTEUS MIRABILIS  Blood Culture (routine x 2)     Status: None   Collection Time: 07/22/15 10:00 AM  Result Value Ref Range Status   Specimen Description BLOOD LEFT ARM  Final   Special Requests BOTTLES DRAWN AEROBIC AND ANAEROBIC 5CC  Final   Culture  Setup Time   Final    GRAM NEGATIVE RODS IN BOTH AEROBIC AND ANAEROBIC BOTTLES CRITICAL RESULT CALLED TO, READ BACK BY AND VERIFIED WITH: Luciano Cutter [id number] [place]    Culture   Final    PROTEUS MIRABILIS SUSCEPTIBILITIES PERFORMED ON PREVIOUS CULTURE WITHIN THE LAST 5 DAYS. Performed at [facility]    Report Status 07/25/2015 FINAL  Final  MRSA PCR Screening     Status: Abnormal   Collection Time: 07/22/15  6:10 PM  Result Value Ref Range Status   MRSA by PCR POSITIVE (A) NEGATIVE Final    Comment:        The GeneXpert MRSA Assay (FDA approved for NASAL specimens only), is one component of a comprehensive MRSA colonization surveillance program. It is not intended to diagnose MRSA infection nor to guide or monitor treatment for MRSA infections. RESULT CALLED TO, READ BACK BY AND VERIFIED WITH: M.BARHAM,RN AT 2016 ON 07/22/15 BY W.SHEA      Studies: No results found.  Scheduled Meds: . baclofen  10 mg Oral BID  .  ceFAZolin (ANCEF) IV  2 g Intravenous Q8H  . Chlorhexidine Gluconate Cloth  6 each Topical Q0600  . enoxaparin  (LOVENOX) injection  40 mg Subcutaneous Q24H  . feeding supplement  1 Container Oral TID BM  . gabapentin  100 mg Oral BID  . peg 3350 powder  1 kit Oral Once  . sertraline  100 mg Oral q morning - 10a   Continuous Infusions: . sodium chloride 1,000 mL (07/28/15 1350)    Active Problems:   Decubitus ulcer limited to breakdown of skin (stage 2)   Chronic pain syndrome   Chronic indwelling Foley catheter   Sepsis ([place])   Complicated urinary tract infection   Hypotension   AKI (acute kidney injury) ([place])   Gram-negative bacteremia ([place])    [facility], [facility] Pager [phone]. If 7PM-7AM, please contact night-coverage at [email or web address], password [facility] 07/28/2015, 4:44 PM  LOS: 6 days

## 2015-07-29 LAB — BASIC METABOLIC PANEL
ANION GAP: 11 (ref 5–15)
BUN: 6 mg/dL (ref 6–20)
CHLORIDE: 103 mmol/L (ref 101–111)
CO2: 30 mmol/L (ref 22–32)
Calcium: 9.1 mg/dL (ref 8.9–10.3)
Creatinine, Ser: 0.41 mg/dL — ABNORMAL LOW (ref 0.61–1.24)
GFR calc Af Amer: >60 mL/min (ref >60)
GFR calc non Af Amer: >60 mL/min (ref >60)
GLUCOSE: 99 mg/dL (ref 65–99)
POTASSIUM: 3.6 mmol/L (ref 3.5–5.1)
Sodium: 144 mmol/L (ref 135–145)

## 2015-07-29 LAB — CBC
HEMATOCRIT: 31.6 % — AB (ref 39.0–52.0)
HEMOGLOBIN: 9.9 g/dL — AB (ref 13.0–17.0)
MCH: 28 pg (ref 26.0–34.0)
MCHC: 31.3 g/dL (ref 30.0–36.0)
MCV: 89.3 fL (ref 78.0–100.0)
Platelets: 382 10*3/uL (ref 150–400)
RBC: 3.54 MIL/uL — AB (ref 4.22–5.81)
RDW: 15 % (ref 11.5–15.5)
WBC: 9.7 10*3/uL (ref 4.0–10.5)

## 2015-07-29 NOTE — Progress Notes (Signed)
Patient discussed with Dr. Wyline Copas. Patient is very impacted and refused to drink Moviprep to help with impaction last night.  He will check on patient to determine next course of treatment.  Continue to monitor for d/c to [facility] when medically stable.  Lorie Phenix. Pauline Good, [place]

## 2015-07-29 NOTE — Progress Notes (Signed)
Pharmacy Antibiotic Follow-up Note  Joshua Park is a 45 y.o. year-old male admitted on 07/22/2015.  The patient is currently on day 8 of antibiotics, day 4 of cefazolin, for urosepsis, Proteus bacteremia.  Assessment/Plan:  This patient's current antibiotics will be continued without adjustments.   Cefazolin 2g IV q8h  Temp (24hrs), Avg:99.5 F (37.5 C), Min:98.8 F (37.1 C), Max:100.1 F (37.8 C)   Recent Labs Lab 07/25/15 0453 07/26/15 0445 07/27/15 0440 07/28/15 0542 07/29/15 0507  WBC 18.6* 16.1* 14.5* 11.6* 9.7    Recent Labs Lab 07/26/15 0445 07/27/15 0440 07/27/15 1506 07/28/15 0542 07/29/15 0507  CREATININE 0.61 0.52* 0.47* 0.43* 0.41*   Estimated Creatinine Clearance: 104.7 mL/min (by C-G formula based on Cr of 0.41).    No Known Allergies  Antimicrobials this admission: 1/15 >> Cefepime >> 1/19 1/19 >> cefazolin >>  Levels/dose changes this admission: None  Microbiology results: 1/15 blood x2: P. Mirabilis (R to cipro and TMP/SMZ) 1/15 urine: mx spp. 1/15 MRSA PCR: positive  Thank you for allowing pharmacy to be a part of this patient's care.  Gretta Arab PharmD, BCPS Pager [phone] 07/29/2015 1:33 PM

## 2015-07-29 NOTE — Progress Notes (Signed)
Pt had a small formed stool.

## 2015-07-29 NOTE — Progress Notes (Signed)
[name] PROGRESS NOTE  Joshua Park [id number] DOB: 01-23-1971 DOA: 07/22/2015 PCP: Gildardo Cranker, DO  HPI/Brief narrative [phone] who presented to ED via EMS from [facility] after reports of change in mental status, found to be septic due to a UTI.   Assessment/Plan: Proteus UTI with bacteremia related to chronic catheter with Severe sepsis present on admission  - Pt with renal failure and hypotension - Improved with hydration, continue antibiotics - lactic acid normalized with IV fluids - leukocytosis improving with abx - Hypotension resolved - Proteus noted on blood cx. Narrowed abx to ancef. Given bacteremia, likely will need 10-14 days of IV abx  Anemia - due to chronic illness / acute sepsis - no bleeding, monitor CBC - Hemoglobin had remained stable  Proteus bacteremia and uti related to chronic catheter - Initially was on Cefepime, narrowed to ancef.  UTI  - Cultures pos for Proteus - abx per above  Neurogenic bladder  - foley was changed in ED   AKI  - 2/2 obstructed foley vs sepsis - Renal function improved nicely with hydration  Constipation - Large stool noted on recent imaging that appeared worse compared to CT findings on 12/21 - No BM despite multiple cathartics given recently - Remains distended and feeling uncomfortable. Pt had been refusing cathartics that were ordered - Today, finally agreed to cathartic. Await return of bowel function  Hypokalemia - Will replace  Hypomagnesemia - Replaced  Code Status: Full Family Communication: Pt in room Disposition Plan: D/c after return of bowel function and when pt can reliably tolerate PO, hopefully next 1-2 days   Consultants:    Procedures:    Antibiotics: Anti-infectives    Start     Dose/Rate Route Frequency Ordered Stop   07/26/15 1800  ceFAZolin (ANCEF) IVPB 2 g/50 mL premix     2 g 100 mL/hr over 30 Minutes Intravenous Every 8 hours 07/26/15 1539     07/23/15 1800   ceFEPIme (MAXIPIME) 1 g in dextrose 5 % 50 mL IVPB  Status:  Discontinued     1 g 100 mL/hr over 30 Minutes Intravenous Every 8 hours 07/23/15 1617 07/26/15 1526   07/23/15 1000  ceFEPIme (MAXIPIME) 1 g in dextrose 5 % 50 mL IVPB  Status:  Discontinued     1 g 100 mL/hr over 30 Minutes Intravenous Every 24 hours 07/22/15 1209 07/23/15 1617   07/22/15 0930  ceFEPIme (MAXIPIME) 2 g in dextrose 5 % 50 mL IVPB     2 g 100 mL/hr over 30 Minutes Intravenous  Once 07/22/15 0927 07/22/15 1050      HPI/Subjective: Still complaining of abd pain and fullness  Objective: Filed Vitals:   07/27/15 2323 07/28/15 0622 07/28/15 2041 07/29/15 0655  BP: 138/84 143/87 142/85 158/90  Pulse: 77 73 72 71  Temp: 97.9 F (36.6 C) 98.3 F (36.8 C) 100.1 F (37.8 C) 98.8 F (37.1 C)  TempSrc: Axillary Axillary Oral Oral  Resp: 18 16 18 16   Height:      Weight:      SpO2: 97% 100% 100% 100%    Intake/Output Summary (Last 24 hours) at 07/29/15 1440 Last data filed at 07/29/15 0658  Gross per 24 hour  Intake      0 ml  Output   3400 ml  Net  -3400 ml   Filed Weights   07/22/15 1800 07/23/15 0500 07/24/15 0350  Weight: 61.7 kg (136 lb 0.4 oz) 65.2 kg (143 lb 11.8 oz) 62.8  kg (138 lb 7.2 oz)    Exam:   General:  Awake, appears uncomfortable, currently, laying in bed  Cardiovascular: regular, s1, s2  Respiratory: normal resp effort, no wheezing  Abdomen: generally tender, decreased BS, seems somewhat less distended  Musculoskeletal: perfused, no cyanosis, no clubbing  Data Reviewed: Basic Metabolic Panel:  Recent Labs Lab 07/26/15 0445 07/27/15 0440 07/27/15 1506 07/28/15 0542 07/29/15 0507  NA 149* 145 144 140 144  K 2.6* 2.7* 2.7* 3.1* 3.6  CL 110 103 101 99* 103  CO2 26 29 32 32 30  GLUCOSE 95 93 90 123* 99  BUN 13 7 8 7 6   CREATININE 0.61 0.52* 0.47* 0.43* 0.41*  CALCIUM 9.0 8.7* 8.8* 8.3* 9.1  MG  --  1.4* 1.8 1.9  --    Liver Function Tests:  Recent Labs Lab  07/26/15 0445  AST 10*  ALT 11*  ALKPHOS 57  BILITOT 0.8  PROT 7.1  ALBUMIN 2.7*   No results for input(s): LIPASE, AMYLASE in the last 168 hours. No results for input(s): AMMONIA in the last 168 hours. CBC:  Recent Labs Lab 07/25/15 0453 07/26/15 0445 07/27/15 0440 07/28/15 0542 07/29/15 0507  WBC 18.6* 16.1* 14.5* 11.6* 9.7  HGB 9.5* 10.0* 9.7* 9.9* 9.9*  HCT 30.0* 31.5* 30.5* 30.4* 31.6*  MCV 89.6 88.7 88.9 86.6 89.3  PLT 213 250 260 301 382   Cardiac Enzymes:  Recent Labs Lab 07/28/15 0906 07/28/15 1459 07/28/15 2107  TROPONINI <0.03 <0.03 <0.03   BNP (last 3 results)  Recent Labs  09/04/14 0127 12/11/14 1202  BNP 9.5 2.3    ProBNP (last 3 results) No results for input(s): PROBNP in the last 8760 hours.  CBG: No results for input(s): GLUCAP in the last 168 hours.  Recent Results (from the past 240 hour(s))  Urine culture     Status: None   Collection Time: 07/22/15  9:47 AM  Result Value Ref Range Status   Specimen Description URINE, CATHETERIZED  Final   Special Requests NONE  Final   Culture   Final    MULTIPLE SPECIES PRESENT, SUGGEST RECOLLECTION Performed at [facility]    Report Status 07/24/2015 FINAL  Final  Blood Culture (routine x 2)     Status: None   Collection Time: 07/22/15  9:50 AM  Result Value Ref Range Status   Specimen Description BLOOD LEFT ANTECUBITAL  Final   Special Requests BOTTLES DRAWN AEROBIC AND ANAEROBIC 5CC  Final   Culture  Setup Time   Final    GRAM NEGATIVE RODS IN BOTH AEROBIC AND ANAEROBIC BOTTLES CRITICAL RESULT CALLED TO, READ BACK BY AND VERIFIED WITH: Luciano Cutter [id number] [place]    Culture   Final    PROTEUS MIRABILIS Performed at [facility]    Report Status 07/25/2015 FINAL  Final   Organism ID, Bacteria PROTEUS MIRABILIS  Final      Susceptibility   Proteus mirabilis - MIC*    AMPICILLIN <=2 SENSITIVE Sensitive     CEFAZOLIN <=4 SENSITIVE Sensitive     CEFEPIME <=1  SENSITIVE Sensitive     CEFTAZIDIME <=1 SENSITIVE Sensitive     CEFTRIAXONE <=1 SENSITIVE Sensitive     CIPROFLOXACIN >=4 RESISTANT Resistant     GENTAMICIN <=1 SENSITIVE Sensitive     IMIPENEM 4 SENSITIVE Sensitive     TRIMETH/SULFA >=320 RESISTANT Resistant     AMPICILLIN/SULBACTAM <=2 SENSITIVE Sensitive     PIP/TAZO <=4 SENSITIVE Sensitive     *  PROTEUS MIRABILIS  Blood Culture (routine x 2)     Status: None   Collection Time: 07/22/15 10:00 AM  Result Value Ref Range Status   Specimen Description BLOOD LEFT ARM  Final   Special Requests BOTTLES DRAWN AEROBIC AND ANAEROBIC 5CC  Final   Culture  Setup Time   Final    GRAM NEGATIVE RODS IN BOTH AEROBIC AND ANAEROBIC BOTTLES CRITICAL RESULT CALLED TO, READ BACK BY AND VERIFIED WITH: Luciano Cutter [id number] [place]    Culture   Final    PROTEUS MIRABILIS SUSCEPTIBILITIES PERFORMED ON PREVIOUS CULTURE WITHIN THE LAST 5 DAYS. Performed at [facility]    Report Status 07/25/2015 FINAL  Final  MRSA PCR Screening     Status: Abnormal   Collection Time: 07/22/15  6:10 PM  Result Value Ref Range Status   MRSA by PCR POSITIVE (A) NEGATIVE Final    Comment:        The GeneXpert MRSA Assay (FDA approved for NASAL specimens only), is one component of a comprehensive MRSA colonization surveillance program. It is not intended to diagnose MRSA infection nor to guide or monitor treatment for MRSA infections. RESULT CALLED TO, READ BACK BY AND VERIFIED WITH: M.BARHAM,RN AT 2016 ON 07/22/15 BY W.SHEA      Studies: No results found.  Scheduled Meds: . baclofen  10 mg Oral BID  .  ceFAZolin (ANCEF) IV  2 g Intravenous Q8H  . enoxaparin (LOVENOX) injection  40 mg Subcutaneous Q24H  . feeding supplement  1 Container Oral TID BM  . gabapentin  100 mg Oral BID  . sertraline  100 mg Oral q morning - 10a   Continuous Infusions: . sodium chloride 1,000 mL (07/29/15 0747)    Active Problems:   Decubitus ulcer limited to  breakdown of skin (stage 2)   Chronic pain syndrome   Chronic indwelling Foley catheter   Sepsis ([place])   Complicated urinary tract infection   Hypotension   AKI (acute kidney injury) ([place])   Gram-negative bacteremia ([place])    [facility], [place] [facility] Pager [phone]. If 7PM-7AM, please contact night-coverage at [email or web address], password [facility] 07/29/2015, 2:40 PM  LOS: 7 days

## 2015-07-29 NOTE — Progress Notes (Signed)
Patient refused to drink Moviprep last night.

## 2015-07-30 ENCOUNTER — Inpatient Hospital Stay (HOSPITAL_COMMUNITY): Payer: Medicaid Other

## 2015-07-30 LAB — CBC
HEMATOCRIT: 30.2 % — AB (ref 39.0–52.0)
Hemoglobin: 9.4 g/dL — ABNORMAL LOW (ref 13.0–17.0)
MCH: 26.9 pg (ref 26.0–34.0)
MCHC: 31.1 g/dL (ref 30.0–36.0)
MCV: 86.3 fL (ref 78.0–100.0)
Platelets: 382 10*3/uL (ref 150–400)
RBC: 3.5 MIL/uL — ABNORMAL LOW (ref 4.22–5.81)
RDW: 14.5 % (ref 11.5–15.5)
WBC: 8.3 10*3/uL (ref 4.0–10.5)

## 2015-07-30 LAB — BASIC METABOLIC PANEL
Anion gap: 11 (ref 5–15)
CHLORIDE: 98 mmol/L — AB (ref 101–111)
CO2: 28 mmol/L (ref 22–32)
CREATININE: 0.4 mg/dL — AB (ref 0.61–1.24)
Calcium: 8.8 mg/dL — ABNORMAL LOW (ref 8.9–10.3)
GFR calc Af Amer: >60 mL/min (ref >60)
GFR calc non Af Amer: >60 mL/min (ref >60)
GLUCOSE: 101 mg/dL — AB (ref 65–99)
POTASSIUM: 3.8 mmol/L (ref 3.5–5.1)
SODIUM: 137 mmol/L (ref 135–145)

## 2015-07-30 MED ORDER — BISACODYL 10 MG RE SUPP
10.0000 mg | Freq: Once | RECTAL | Status: AC
  Administered 2015-07-30: 10 mg via RECTAL
  Filled 2015-07-30: qty 1

## 2015-07-30 MED ORDER — MAGNESIUM CITRATE PO SOLN
1.0000 | Freq: Once | ORAL | Status: AC
  Administered 2015-07-30: 1 via ORAL

## 2015-07-30 NOTE — Progress Notes (Signed)
[name] PROGRESS NOTE  Joshua Park [id number] DOB: 04-Sep-1970 DOA: 07/22/2015 PCP: Gildardo Cranker, DO  HPI/Brief narrative [phone] who presented to ED via EMS from [facility] after reports of change in mental status, found to be septic due to a UTI.   Assessment/Plan: Proteus UTI with bacteremia related to chronic catheter with Severe sepsis present on admission  - Pt with renal failure and hypotension - Improved with hydration, continue antibiotics - lactic acid normalized with IV fluids - leukocytosis improving with abx - Hypotension resolved - Proteus noted on blood cx. Narrowed abx to ancef. Given bacteremia, likely will need 10-14 days of IV abx  Anemia - due to chronic illness / acute sepsis - no bleeding, monitor CBC - Hemoglobin had remained stable  Proteus bacteremia and uti related to chronic catheter - Initially was on Cefepime, narrowed to ancef.  UTI  - Cultures pos for Proteus - abx per above  Neurogenic bladder  - foley was changed in ED   AKI  - 2/2 obstructed foley vs sepsis - Renal function improved nicely with hydration  Constipation - Large stool noted on recent imaging that appeared worse compared to CT findings on 12/21 - No BM despite multiple cathartics given recently - Today, abd remains generally tender and distended. Abd xray done, which per my own read demonstrates continued constipation - Mg citrate given  Hypokalemia - Will replace  Hypomagnesemia - Replaced  Code Status: Full Family Communication: Pt in room Disposition Plan: D/c after return of bowel function and when pt can reliably tolerate PO, hopefully next 1-2 days   Consultants:    Procedures:    Antibiotics: Anti-infectives    Start     Dose/Rate Route Frequency Ordered Stop   07/26/15 1800  ceFAZolin (ANCEF) IVPB 2 g/50 mL premix     2 g 100 mL/hr over 30 Minutes Intravenous Every 8 hours 07/26/15 1539     07/23/15 1800  ceFEPIme (MAXIPIME)  1 g in dextrose 5 % 50 mL IVPB  Status:  Discontinued     1 g 100 mL/hr over 30 Minutes Intravenous Every 8 hours 07/23/15 1617 07/26/15 1526   07/23/15 1000  ceFEPIme (MAXIPIME) 1 g in dextrose 5 % 50 mL IVPB  Status:  Discontinued     1 g 100 mL/hr over 30 Minutes Intravenous Every 24 hours 07/22/15 1209 07/23/15 1617   07/22/15 0930  ceFEPIme (MAXIPIME) 2 g in dextrose 5 % 50 mL IVPB     2 g 100 mL/hr over 30 Minutes Intravenous  Once 07/22/15 0927 07/22/15 1050      HPI/Subjective: Continues with abd fullness and pain. Small BM overnight  Objective: Filed Vitals:   07/29/15 0655 07/29/15 1451 07/29/15 2346 07/30/15 0522  BP: 158/90 149/93 132/82 142/92  Pulse: 71 76 74 70  Temp: 98.8 F (37.1 C) 98.7 F (37.1 C) 98.2 F (36.8 C) 98.5 F (36.9 C)  TempSrc: Oral Oral Oral Oral  Resp: 16 20 16 18   Height:      Weight:      SpO2: 100% 100% 100% 100%    Intake/Output Summary (Last 24 hours) at 07/30/15 1427 Last data filed at 07/30/15 0522  Gross per 24 hour  Intake   1879 ml  Output   2975 ml  Net  -1096 ml   Filed Weights   07/22/15 1800 07/23/15 0500 07/24/15 0350  Weight: 61.7 kg (136 lb 0.4 oz) 65.2 kg (143 lb 11.8 oz) 62.8 kg (138 lb  7.2 oz)    Exam:   General:  Awake, appears uncomfortable, currently, laying in bed  Cardiovascular: regular, s1, s2  Respiratory: normal resp effort, no wheezing  Abdomen: generally tender, decreased BS, seems somewhat less distended  Musculoskeletal: perfused, no clubbing  Data Reviewed: Basic Metabolic Panel:  Recent Labs Lab 07/27/15 0440 07/27/15 1506 07/28/15 0542 07/29/15 0507 07/30/15 0521  NA 145 144 140 144 137  K 2.7* 2.7* 3.1* 3.6 3.8  CL 103 101 99* 103 98*  CO2 29 32 32 30 28  GLUCOSE 93 90 123* 99 101*  BUN 7 8 7 6  <5*  CREATININE 0.52* 0.47* 0.43* 0.41* 0.40*  CALCIUM 8.7* 8.8* 8.3* 9.1 8.8*  MG 1.4* 1.8 1.9  --   --    Liver Function Tests:  Recent Labs Lab 07/26/15 0445  AST 10*   ALT 11*  ALKPHOS 57  BILITOT 0.8  PROT 7.1  ALBUMIN 2.7*   No results for input(s): LIPASE, AMYLASE in the last 168 hours. No results for input(s): AMMONIA in the last 168 hours. CBC:  Recent Labs Lab 07/26/15 0445 07/27/15 0440 07/28/15 0542 07/29/15 0507 07/30/15 0521  WBC 16.1* 14.5* 11.6* 9.7 8.3  HGB 10.0* 9.7* 9.9* 9.9* 9.4*  HCT 31.5* 30.5* 30.4* 31.6* 30.2*  MCV 88.7 88.9 86.6 89.3 86.3  PLT 250 260 301 382 382   Cardiac Enzymes:  Recent Labs Lab 07/28/15 0906 07/28/15 1459 07/28/15 2107  TROPONINI <0.03 <0.03 <0.03   BNP (last 3 results)  Recent Labs  09/04/14 0127 12/11/14 1202  BNP 9.5 2.3    ProBNP (last 3 results) No results for input(s): PROBNP in the last 8760 hours.  CBG: No results for input(s): GLUCAP in the last 168 hours.  Recent Results (from the past 240 hour(s))  Urine culture     Status: None   Collection Time: 07/22/15  9:47 AM  Result Value Ref Range Status   Specimen Description URINE, CATHETERIZED  Final   Special Requests NONE  Final   Culture   Final    MULTIPLE SPECIES PRESENT, SUGGEST RECOLLECTION Performed at [facility]    Report Status 07/24/2015 FINAL  Final  Blood Culture (routine x 2)     Status: None   Collection Time: 07/22/15  9:50 AM  Result Value Ref Range Status   Specimen Description BLOOD LEFT ANTECUBITAL  Final   Special Requests BOTTLES DRAWN AEROBIC AND ANAEROBIC 5CC  Final   Culture  Setup Time   Final    GRAM NEGATIVE RODS IN BOTH AEROBIC AND ANAEROBIC BOTTLES CRITICAL RESULT CALLED TO, READ BACK BY AND VERIFIED WITH: Luciano Cutter [id number] [place]    Culture   Final    PROTEUS MIRABILIS Performed at [facility]    Report Status 07/25/2015 FINAL  Final   Organism ID, Bacteria PROTEUS MIRABILIS  Final      Susceptibility   Proteus mirabilis - MIC*    AMPICILLIN <=2 SENSITIVE Sensitive     CEFAZOLIN <=4 SENSITIVE Sensitive     CEFEPIME <=1 SENSITIVE Sensitive      CEFTAZIDIME <=1 SENSITIVE Sensitive     CEFTRIAXONE <=1 SENSITIVE Sensitive     CIPROFLOXACIN >=4 RESISTANT Resistant     GENTAMICIN <=1 SENSITIVE Sensitive     IMIPENEM 4 SENSITIVE Sensitive     TRIMETH/SULFA >=320 RESISTANT Resistant     AMPICILLIN/SULBACTAM <=2 SENSITIVE Sensitive     PIP/TAZO <=4 SENSITIVE Sensitive     * PROTEUS MIRABILIS  Blood Culture (routine x 2)     Status: None   Collection Time: 07/22/15 10:00 AM  Result Value Ref Range Status   Specimen Description BLOOD LEFT ARM  Final   Special Requests BOTTLES DRAWN AEROBIC AND ANAEROBIC 5CC  Final   Culture  Setup Time   Final    GRAM NEGATIVE RODS IN BOTH AEROBIC AND ANAEROBIC BOTTLES CRITICAL RESULT CALLED TO, READ BACK BY AND VERIFIED WITH: Luciano Cutter [id number] [place]    Culture   Final    PROTEUS MIRABILIS SUSCEPTIBILITIES PERFORMED ON PREVIOUS CULTURE WITHIN THE LAST 5 DAYS. Performed at [facility]    Report Status 07/25/2015 FINAL  Final  MRSA PCR Screening     Status: Abnormal   Collection Time: 07/22/15  6:10 PM  Result Value Ref Range Status   MRSA by PCR POSITIVE (A) NEGATIVE Final    Comment:        The GeneXpert MRSA Assay (FDA approved for NASAL specimens only), is one component of a comprehensive MRSA colonization surveillance program. It is not intended to diagnose MRSA infection nor to guide or monitor treatment for MRSA infections. RESULT CALLED TO, READ BACK BY AND VERIFIED WITH: M.BARHAM,RN AT 2016 ON 07/22/15 BY W.SHEA      Studies: Dg Abd Portable 1v  07/30/2015  CLINICAL DATA:  Constipation EXAM: PORTABLE ABDOMEN - 1 VIEW COMPARISON:  07/25/2015 FINDINGS: The bowel gas pattern is normal. There is a moderate stool burden throughout the colon. IVC filter is noted. No radio-opaque calculi or other significant radiographic abnormality are seen. IMPRESSION: 1. No bowel obstruction. 2. Moderate stool burden within the colon compatible with the clinical history of constipation.  Electronically Signed   By: Kerby Moors M.D.   On: 07/30/2015 08:34    Scheduled Meds: . baclofen  10 mg Oral BID  .  ceFAZolin (ANCEF) IV  2 g Intravenous Q8H  . enoxaparin (LOVENOX) injection  40 mg Subcutaneous Q24H  . feeding supplement  1 Container Oral TID BM  . gabapentin  100 mg Oral BID  . sertraline  100 mg Oral q morning - 10a   Continuous Infusions: . sodium chloride 1,000 mL (07/30/15 1140)    Active Problems:   Decubitus ulcer limited to breakdown of skin (stage 2)   Chronic pain syndrome   Chronic indwelling Foley catheter   Sepsis ([place])   Complicated urinary tract infection   Hypotension   AKI (acute kidney injury) ([place])   Gram-negative bacteremia ([place])    [facility]auer Ausborn, [place] [facility] Pager [phone]. If 7PM-7AM, please contact night-coverage at [email or web address], password [facility] 07/30/2015, 2:27 PM  LOS: 8 days

## 2015-07-31 LAB — CBC
HCT: 29.7 % — ABNORMAL LOW (ref 39.0–52.0)
Hemoglobin: 9.4 g/dL — ABNORMAL LOW (ref 13.0–17.0)
MCH: 28.1 pg (ref 26.0–34.0)
MCHC: 31.6 g/dL (ref 30.0–36.0)
MCV: 88.9 fL (ref 78.0–100.0)
PLATELETS: 476 10*3/uL — AB (ref 150–400)
RBC: 3.34 MIL/uL — ABNORMAL LOW (ref 4.22–5.81)
RDW: 14.7 % (ref 11.5–15.5)
WBC: 8.9 10*3/uL (ref 4.0–10.5)

## 2015-07-31 LAB — BASIC METABOLIC PANEL
Anion gap: 9 (ref 5–15)
BUN: 6 mg/dL (ref 6–20)
CHLORIDE: 102 mmol/L (ref 101–111)
CO2: 29 mmol/L (ref 22–32)
CREATININE: 0.49 mg/dL — AB (ref 0.61–1.24)
Calcium: 9.1 mg/dL (ref 8.9–10.3)
GFR calc Af Amer: >60 mL/min (ref >60)
GFR calc non Af Amer: >60 mL/min (ref >60)
Glucose, Bld: 95 mg/dL (ref 65–99)
Potassium: 3.8 mmol/L (ref 3.5–5.1)
SODIUM: 140 mmol/L (ref 135–145)

## 2015-07-31 MED ORDER — DOCUSATE SODIUM 100 MG PO CAPS: 100.0000 mg | ORAL_CAPSULE | Freq: Two times a day (BID) | ORAL | Status: DC

## 2015-07-31 MED ORDER — CEPHALEXIN 500 MG PO CAPS: 500.0000 mg | ORAL_CAPSULE | Freq: Four times a day (QID) | ORAL | Status: DC

## 2015-07-31 MED ORDER — POLYETHYLENE GLYCOL 3350 17 G PO PACK: 17.0000 g | PACK | Freq: Every day | ORAL | Status: DC

## 2015-07-31 NOTE — Progress Notes (Signed)
Patient is set to discharge back to [facility] today. Patient aware. Discharge packet given to RN, Tess. [facility] called for transport.     Raynaldo Opitz, [facility] Clinical Social Worker cell #: [phone]

## 2015-07-31 NOTE — Discharge Summary (Addendum)
Physician Discharge Summary  Joshua Park [id number] DOB: 18-Feb-1971 DOA: 07/22/2015  PCP: No primary care provider on file.  Admit date: 07/22/2015 Discharge date: 08/15/2015  Time spent: 20 minutes  Recommendations for Outpatient Follow-up:  1. Follow up with PCP in 1-2 weeks  2. Complete PO keflex x 5 more days after discharge 3. Please ensure daily/regular bowel movements   Discharge Diagnoses:  Active Problems:   Decubitus ulcer limited to breakdown of skin (stage 2)   Chronic pain syndrome   Chronic indwelling Foley catheter   Sepsis ([place])   Complicated urinary tract infection   Hypotension   AKI (acute kidney injury) ([place])   Gram-negative bacteremia ([place])   Stage 4 pressure ulcer ([place])   Discharge Condition: Improved  Diet recommendation: Regular  Filed Weights   07/22/15 1800 07/23/15 0500 07/24/15 0350  Weight: 61.7 kg (136 lb 0.4 oz) 65.2 kg (143 lb 11.8 oz) 62.8 kg (138 lb 7.2 oz)    History of present illness:  Please review dictated H and P from 1/15 for details. Briefly, 45yo who presented to ED via EMS from [facility] after reports of change in mental status, found to be septic due to a UTI. Patient was also found to be impacted with stool.  Hospital Course:  Proteus UTI with bacteremia related to chronic catheter with Severe sepsis present on admission  - Pt with renal failure and hypotension - Improved with hydration, continue antibiotics - lactic acid normalized with IV fluids - leukocytosis improving with abx - Hypotension resolved - Proteus noted on blood cx. Narrowed abx to ancef. As bowel function has improved, will complete course with keflex x 5 more days after discharge  Anemia - due to chronic illness / acute sepsis - no bleeding, monitor CBC - Hemoglobin had remained stable  Proteus bacteremia and uti related to chronic catheter - Initially was on Cefepime, narrowed to ancef, keflex on discharge  UTI  - Cultures pos for  Proteus - abx per above  Neurogenic bladder  - foley was changed in ED   AKI  - 2/2 obstructed foley vs sepsis - Renal function improved nicely with hydration  Constipation - Large stool noted on recent imaging that appeared worse compared to CT findings on 12/21 - Required multiple cathartics to eventually promote bowel function - Recommend close assessment to ensure daily/regular bowel movements, cathartics as needed  Hypokalemia - replaced  Hypomagnesemia - Replaced  Stage 4 Pressure Ulcer - WOC was consulted  Discharge Exam: Filed Vitals:   07/30/15 1500 07/30/15 2311 07/31/15 0430 07/31/15 1300  BP: 138/91 132/92 132/91 134/88  Pulse: 81 80 74 78  Temp: 98.4 F (36.9 C) 98.2 F (36.8 C) 98.6 F (37 C) 98.4 F (36.9 C)  TempSrc: Oral Oral Oral Oral  Resp: 18 16 18 16   Height:      Weight:      SpO2: 100% 96% 100% 99%    General: Awake, in nad Cardiovascular: regular, s1, s2 Respiratory: normal resp effort, no wheezing  Discharge Instructions     Medication List    STOP taking these medications        amoxicillin-clavulanate 875-125 MG tablet  Commonly known as:  AUGMENTIN     Potassium Chloride ER 20 MEQ Tbcr      TAKE these medications        baclofen 10 MG tablet  Commonly known as:  LIORESAL  Take 1 tablet (10 mg total) by mouth 2 (two) times daily.  benztropine 0.5 MG tablet  Commonly known as:  COGENTIN  Take 1 tablet (0.5 mg total) by mouth at bedtime.     calcium carbonate 500 MG chewable tablet  Commonly known as:  TUMS - dosed in mg elemental calcium  Chew 2 tablets by mouth daily as needed for indigestion or heartburn.     cephALEXin 500 MG capsule  Commonly known as:  KEFLEX  Take 1 capsule (500 mg total) by mouth 4 (four) times daily.     clonazePAM 0.5 MG tablet  Commonly known as:  KLONOPIN  Take 0.5 tablets (0.25 mg total) by mouth daily as needed for anxiety.     docusate sodium 100 MG capsule  Commonly known  as:  COLACE  Take 1 capsule (100 mg total) by mouth daily as needed for mild constipation or moderate constipation.     docusate sodium 100 MG capsule  Commonly known as:  COLACE  Take 1 capsule (100 mg total) by mouth 2 (two) times daily.     feeding supplement (PRO-STAT SUGAR FREE 64) Liqd  Take 30 mLs by mouth 2 (two) times daily.     gabapentin 100 MG capsule  Commonly known as:  NEURONTIN  Take 1 capsule (100 mg total) by mouth 2 (two) times daily.     oxyCODONE 15 MG immediate release tablet  Commonly known as:  ROXICODONE  Take 0.5 tablets (7.5 mg total) by mouth every 4 (four) hours as needed for pain.     polyethylene glycol packet  Commonly known as:  MIRALAX / GLYCOLAX  Take 17 g by mouth daily.     risperiDONE 0.5 MG disintegrating tablet  Commonly known as:  RISPERDAL M-TABS  Take 1 tablet (0.5 mg total) by mouth at bedtime.     sertraline 100 MG tablet  Commonly known as:  ZOLOFT  Take 100 mg by mouth every morning.     traZODone 100 MG tablet  Commonly known as:  DESYREL  Take 1 tablet (100 mg total) by mouth at bedtime.     vitamin B-12 1000 MCG tablet  Commonly known as:  CYANOCOBALAMIN  Take 1,000 mcg by mouth daily.     vitamin C 500 MG tablet  Commonly known as:  ASCORBIC ACID  Take 500 mg by mouth daily.       No Known Allergies Follow-up Information    Follow up with [facility].   Specialty:  [place] information:   [address] [place] [phone]      Please follow up.   Why:  Facility Physician will see patient in 1 week       The results of significant diagnostics from this hospitalization (including imaging, microbiology, ancillary and laboratory) are listed below for reference.    Significant Diagnostic Studies: Dg Chest Port 1 View  07/22/2015  CLINICAL DATA:  Shortness of breath and fever today, former smoker, history TB, paraplegia EXAM:  PORTABLE CHEST 1 VIEW COMPARISON:  Portable exam 1007 hours compared 06/28/2015 FINDINGS: Embolization coils LEFT cervical region. Normal heart size, mediastinal contours and pulmonary vascularity. Lungs clear. No pleural effusion or pneumothorax. Bones unremarkable. IMPRESSION: No acute abnormalities. Electronically Signed   By: Lavonia Dana M.D.   On: 07/22/2015 11:10   Dg Abd Portable 1v  07/30/2015  CLINICAL DATA:  Constipation EXAM: PORTABLE ABDOMEN - 1 VIEW COMPARISON:  07/25/2015 FINDINGS: The bowel gas pattern is normal. There is a  moderate stool burden throughout the colon. IVC filter is noted. No radio-opaque calculi or other significant radiographic abnormality are seen. IMPRESSION: 1. No bowel obstruction. 2. Moderate stool burden within the colon compatible with the clinical history of constipation. Electronically Signed   By: Kerby Moors M.D.   On: 07/30/2015 08:34   Dg Abd Portable 1v  07/25/2015  CLINICAL DATA:  45 year old paraplegic male with constipation. Initial encounter. EXAM: PORTABLE ABDOMEN - 1 VIEW COMPARISON:  CT Abdomen and Pelvis 06/27/2015 and earlier. FINDINGS: Portable AP supine view at 1524 hours. Stable visualized osseous structures. IVC filter remains in place. Urinary versus rectal catheter projects over the pelvis. Non obstructed bowel gas pattern. Moderate to large volume of retained stool in the colon, stable to mildly increased overall compared to the prior CT. Stable visualized lung bases. No definite pneumoperitoneum on these supine views. IMPRESSION: Nonobstructed bowel-gas pattern with moderate to large volume of retained stool in a colon which appears mildly increased compared to the recent CT Abdomen and Pelvis. Electronically Signed   By: Genevie Ann M.D.   On: 07/25/2015 15:48    Microbiology: No results found for this or any previous visit (from the past 240 hour(s)).   Labs: Basic Metabolic Panel: No results for input(s): NA, K, CL, CO2, GLUCOSE, BUN,  CREATININE, CALCIUM, MG, PHOS in the last 168 hours. Liver Function Tests: No results for input(s): AST, ALT, ALKPHOS, BILITOT, PROT, ALBUMIN in the last 168 hours. No results for input(s): LIPASE, AMYLASE in the last 168 hours. No results for input(s): AMMONIA in the last 168 hours. CBC: No results for input(s): WBC, NEUTROABS, HGB, HCT, MCV, PLT in the last 168 hours. Cardiac Enzymes: No results for input(s): CKTOTAL, CKMB, CKMBINDEX, TROPONINI in the last 168 hours. BNP: BNP (last 3 results)  Recent Labs  09/04/14 0127 12/11/14 1202  BNP 9.5 2.3    ProBNP (last 3 results) No results for input(s): PROBNP in the last 8760 hours.  CBG: No results for input(s): GLUCAP in the last 168 hours.  Signed:  CHIU, STEPHEN K  [name] 08/15/2015, 11:14 AM

## 2015-08-02 ENCOUNTER — Non-Acute Institutional Stay (SKILLED_NURSING_FACILITY): Payer: Medicaid Other | Admitting: Adult Health

## 2015-08-02 DIAGNOSIS — G839 Paralytic syndrome, unspecified: Secondary | ICD-10-CM | POA: Diagnosis not present

## 2015-08-02 DIAGNOSIS — G894 Chronic pain syndrome: Secondary | ICD-10-CM | POA: Diagnosis not present

## 2015-08-02 DIAGNOSIS — Z9289 Personal history of other medical treatment: Secondary | ICD-10-CM

## 2015-08-02 DIAGNOSIS — R7881 Bacteremia: Secondary | ICD-10-CM

## 2015-08-02 DIAGNOSIS — F333 Major depressive disorder, recurrent, severe with psychotic symptoms: Secondary | ICD-10-CM

## 2015-08-02 DIAGNOSIS — L8994 Pressure ulcer of unspecified site, stage 4: Secondary | ICD-10-CM | POA: Diagnosis not present

## 2015-08-02 DIAGNOSIS — Z978 Presence of other specified devices: Secondary | ICD-10-CM

## 2015-08-02 DIAGNOSIS — Z960 Presence of urogenital implants: Secondary | ICD-10-CM

## 2015-08-07 ENCOUNTER — Non-Acute Institutional Stay (SKILLED_NURSING_FACILITY): Payer: Medicaid Other | Admitting: Internal Medicine

## 2015-08-07 ENCOUNTER — Encounter: Payer: Self-pay | Admitting: Internal Medicine

## 2015-08-07 DIAGNOSIS — G839 Paralytic syndrome, unspecified: Secondary | ICD-10-CM

## 2015-08-07 DIAGNOSIS — L8990 Pressure ulcer of unspecified site, unspecified stage: Secondary | ICD-10-CM

## 2015-08-07 DIAGNOSIS — F333 Major depressive disorder, recurrent, severe with psychotic symptoms: Secondary | ICD-10-CM | POA: Diagnosis not present

## 2015-08-07 DIAGNOSIS — E43 Unspecified severe protein-calorie malnutrition: Secondary | ICD-10-CM | POA: Diagnosis not present

## 2015-08-07 DIAGNOSIS — N390 Urinary tract infection, site not specified: Secondary | ICD-10-CM | POA: Diagnosis not present

## 2015-08-07 DIAGNOSIS — Z9289 Personal history of other medical treatment: Secondary | ICD-10-CM

## 2015-08-07 DIAGNOSIS — G894 Chronic pain syndrome: Secondary | ICD-10-CM | POA: Diagnosis not present

## 2015-08-07 DIAGNOSIS — K5901 Slow transit constipation: Secondary | ICD-10-CM

## 2015-08-07 DIAGNOSIS — Z960 Presence of urogenital implants: Secondary | ICD-10-CM

## 2015-08-07 DIAGNOSIS — Z9114 Patient's other noncompliance with medication regimen: Secondary | ICD-10-CM

## 2015-08-07 DIAGNOSIS — Z978 Presence of other specified devices: Secondary | ICD-10-CM

## 2015-08-07 NOTE — Progress Notes (Signed)
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 versus rectal catheter projects over the pelvis. Non obstructed bowel gas pattern. Moderate to large volume of retained stool in the colon, stable to mildly increased overall compared to the prior CT. Stable visualized lung bases. No definite pneumoperitoneum on these supine views. IMPRESSION: Nonobstructed bowel-gas pattern with moderate to large volume of retained stool in a colon which appears mildly increased compared to the recent CT Abdomen and Pelvis. Electronically Signed   By: Genevie Ann M.D.   On: 07/25/2015 15:48     Assessment/Plan   ICD-9-CM ICD-10-CM   1. MDD (major depressive disorder), recurrent, severe, with psychosis ([place]) 296.34 F33.3   2. Pressure ulcer 707.00 L89.90    707.20    3. Chronic pain syndrome 338.4 G89.4   4. Protein-calorie malnutrition, severe  ([place]) 262 E43   5. Slow transit constipation 564.01 K59.01   6. Recurrent UTI 599.0 N39.0   7. Foley catheter in place V45.89 Z92.89   8. Paralytic syndrome, bilateral (H[facility]) 344.9 G83.9   9. Noncompliance with medication regimen V15.81 Z91.14     Calorie count as ordered  Complete abx today  He is noncompliant with medical recommendations - refusing all care and meds. He is in denial regarding current medical state and need to be compliant in order to improve QOL  Wound care as ordered  Foley cath care as indicated  Nutritional supplement as ordered  psych services to follow  GOAL: long term care. Consult palliative care due to FTT. Communicated with pt and nursing.  Will follow  Monserrate Blaschke S. Perlie Gold  [facility] [address] [place], [place] [address] [phone] Cell (Monday-Friday 8 AM - 5 PM) [phone]  After 5 PM and follow prompts

## 2015-08-13 ENCOUNTER — Non-Acute Institutional Stay (SKILLED_NURSING_FACILITY): Payer: Medicaid Other | Admitting: Adult Health

## 2015-08-13 DIAGNOSIS — L8994 Pressure ulcer of unspecified site, stage 4: Secondary | ICD-10-CM

## 2015-08-13 DIAGNOSIS — Z5329 Procedure and treatment not carried out because of patient's decision for other reasons: Secondary | ICD-10-CM

## 2015-08-15 DIAGNOSIS — L8994 Pressure ulcer of unspecified site, stage 4: Secondary | ICD-10-CM

## 2015-08-17 ENCOUNTER — Non-Acute Institutional Stay (SKILLED_NURSING_FACILITY): Payer: Medicaid Other | Admitting: Adult Health

## 2015-08-17 DIAGNOSIS — G839 Paralytic syndrome, unspecified: Secondary | ICD-10-CM | POA: Diagnosis not present

## 2015-08-17 DIAGNOSIS — Z5329 Procedure and treatment not carried out because of patient's decision for other reasons: Secondary | ICD-10-CM | POA: Diagnosis not present

## 2015-08-17 DIAGNOSIS — L8994 Pressure ulcer of unspecified site, stage 4: Secondary | ICD-10-CM

## 2015-08-28 ENCOUNTER — Encounter: Payer: Self-pay | Admitting: Adult Health

## 2015-08-28 DIAGNOSIS — Z5329 Procedure and treatment not carried out because of patient's decision for other reasons: Secondary | ICD-10-CM | POA: Insufficient documentation
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
## 2015-08-30 ENCOUNTER — Non-Acute Institutional Stay (SKILLED_NURSING_FACILITY): Payer: Medicaid Other | Admitting: Adult Health

## 2015-08-30 ENCOUNTER — Encounter: Payer: Self-pay | Admitting: Adult Health

## 2015-08-30 DIAGNOSIS — F22 Delusional disorders: Secondary | ICD-10-CM

## 2015-08-30 DIAGNOSIS — N319 Neuromuscular dysfunction of bladder, unspecified: Secondary | ICD-10-CM | POA: Diagnosis not present

## 2015-08-30 DIAGNOSIS — G894 Chronic pain syndrome: Secondary | ICD-10-CM

## 2015-08-30 DIAGNOSIS — L8994 Pressure ulcer of unspecified site, stage 4: Secondary | ICD-10-CM

## 2015-08-30 DIAGNOSIS — F329 Major depressive disorder, single episode, unspecified: Secondary | ICD-10-CM

## 2015-08-30 DIAGNOSIS — R339 Retention of urine, unspecified: Secondary | ICD-10-CM

## 2015-08-30 DIAGNOSIS — Z9289 Personal history of other medical treatment: Secondary | ICD-10-CM

## 2015-08-30 DIAGNOSIS — Z978 Presence of other specified devices: Secondary | ICD-10-CM

## 2015-08-30 DIAGNOSIS — F418 Other specified anxiety disorders: Secondary | ICD-10-CM | POA: Diagnosis not present

## 2015-08-30 DIAGNOSIS — G839 Paralytic syndrome, unspecified: Secondary | ICD-10-CM

## 2015-08-30 DIAGNOSIS — F419 Anxiety disorder, unspecified: Secondary | ICD-10-CM

## 2015-08-30 DIAGNOSIS — Z960 Presence of urogenital implants: Secondary | ICD-10-CM
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
## 2015-09-03 ENCOUNTER — Non-Acute Institutional Stay (SKILLED_NURSING_FACILITY): Payer: Medicaid Other | Admitting: Adult Health

## 2015-09-03 ENCOUNTER — Encounter (HOSPITAL_COMMUNITY): Payer: Self-pay | Admitting: *Deleted

## 2015-09-03 ENCOUNTER — Encounter: Payer: Self-pay | Admitting: Adult Health

## 2015-09-03 ENCOUNTER — Emergency Department (HOSPITAL_COMMUNITY)
Admission: EM | Admit: 2015-09-03 | Discharge: 2015-09-04 | Disposition: A | Discharge status: Home / Self Care | Payer: Medicaid Other | Attending: Emergency Medicine | Admitting: Emergency Medicine

## 2015-09-03 DIAGNOSIS — Z86718 Personal history of other venous thrombosis and embolism: Secondary | ICD-10-CM | POA: Insufficient documentation

## 2015-09-03 DIAGNOSIS — L89894 Pressure ulcer of other site, stage 4: Secondary | ICD-10-CM | POA: Insufficient documentation

## 2015-09-03 DIAGNOSIS — L89610 Pressure ulcer of right heel, unstageable: Secondary | ICD-10-CM | POA: Diagnosis not present

## 2015-09-03 DIAGNOSIS — Y658 Other specified misadventures during surgical and medical care: Secondary | ICD-10-CM | POA: Diagnosis not present

## 2015-09-03 DIAGNOSIS — Z862 Personal history of diseases of the blood and blood-forming organs and certain disorders involving the immune mechanism: Secondary | ICD-10-CM | POA: Diagnosis not present

## 2015-09-03 DIAGNOSIS — K219 Gastro-esophageal reflux disease without esophagitis: Secondary | ICD-10-CM | POA: Diagnosis not present

## 2015-09-03 DIAGNOSIS — Z79899 Other long term (current) drug therapy: Secondary | ICD-10-CM | POA: Insufficient documentation

## 2015-09-03 DIAGNOSIS — N319 Neuromuscular dysfunction of bladder, unspecified: Secondary | ICD-10-CM | POA: Diagnosis not present

## 2015-09-03 DIAGNOSIS — R319 Hematuria, unspecified: Secondary | ICD-10-CM | POA: Diagnosis present

## 2015-09-03 DIAGNOSIS — T83511A Infection and inflammatory reaction due to indwelling urethral catheter, initial encounter: Secondary | ICD-10-CM | POA: Diagnosis not present

## 2015-09-03 DIAGNOSIS — L89620 Pressure ulcer of left heel, unstageable: Secondary | ICD-10-CM | POA: Diagnosis not present

## 2015-09-03 DIAGNOSIS — Z87891 Personal history of nicotine dependence: Secondary | ICD-10-CM | POA: Diagnosis not present

## 2015-09-03 DIAGNOSIS — Z8611 Personal history of tuberculosis: Secondary | ICD-10-CM | POA: Diagnosis not present

## 2015-09-03 DIAGNOSIS — N390 Urinary tract infection, site not specified: Secondary | ICD-10-CM | POA: Diagnosis not present

## 2015-09-03 DIAGNOSIS — F419 Anxiety disorder, unspecified: Secondary | ICD-10-CM | POA: Insufficient documentation

## 2015-09-03 DIAGNOSIS — E43 Unspecified severe protein-calorie malnutrition: Secondary | ICD-10-CM | POA: Diagnosis not present

## 2015-09-03 LAB — URINE MICROSCOPIC-ADD ON

## 2015-09-03 LAB — URINALYSIS, ROUTINE W REFLEX MICROSCOPIC
Bilirubin Urine: NEGATIVE
GLUCOSE, UA: NEGATIVE mg/dL
Ketones, ur: NEGATIVE mg/dL
NITRITE: NEGATIVE
PH: 7 (ref 5.0–8.0)
Protein, ur: 30 mg/dL — AB
SPECIFIC GRAVITY, URINE: 1.013 (ref 1.005–1.030)

## 2015-09-03 MED ORDER — CEFTRIAXONE SODIUM 1 G IJ SOLR
1.0000 g | Freq: Once | INTRAMUSCULAR | Status: AC
  Administered 2015-09-04: 1 g via INTRAMUSCULAR
  Filled 2015-09-03: qty 10

## 2015-09-03 MED ORDER — LORAZEPAM 1 MG PO TABS
1.0000 mg | ORAL_TABLET | Freq: Once | ORAL | Status: DC
  Filled 2015-09-03: qty 1
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
## 2015-09-03 NOTE — ED Notes (Signed)
Gave pt graham crackers, per Vernie Shanks - PA.

## 2015-09-03 NOTE — ED Notes (Signed)
Patient arrived to ED via [facility]. EMS reports: patient is paraplegic, resident at [facility]. Noncompliant with medications/baths, but compliant with Oxycodone. Patient has continual foley catheter. Nursing home staff changing catheter this evening. When staff tried to reinsert FC, blood noted when catheter advanced. EMS called. Blood clot noted at end of penis. No active bleeding. VSS - BP 118/74, Pulse 100, Resp 16. Patient has chronic generalized pain treated with Oxycodone.

## 2015-09-03 NOTE — ED Notes (Signed)
Checked foley site area. There is still some bleeding around the urethral meatus. Abigail - PA viewed the site and is aware of the bleeding.

## 2015-09-03 NOTE — ED Provider Notes (Signed)
Quadriplegic patient with an indwelling Foley catheter, this was removed today, when it was attempted to be changed on replacement of the catheter there was bleeding from the urethral meatus. This caused the nurses at the facility to stop, they sent the patient in for urethral bleeding. On exam after our nurses placed a successful Foley catheter is noted that the urine in the Foley catheter is very clear, there is blood at the urethral meatus but is not ongoing. The patient is otherwise well-appearing  Medical screening examination/treatment/procedure(s) were conducted as a shared visit with non-physician practitioner(s) and myself.  I personally evaluated the patient during the encounter.  Clinical Impression:   Final diagnoses:  Urinary tract infection associated with indwelling urethral catheter, initial encounter         Noemi Chapel, MD 09/11/15 1217
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
## 2015-09-04 ENCOUNTER — Encounter: Payer: Self-pay | Admitting: Internal Medicine

## 2015-09-04 MED ORDER — STERILE WATER FOR INJECTION IJ SOLN
2.1000 mL | Freq: Once | INTRAMUSCULAR | Status: AC
  Administered 2015-09-04: 2.1 mL via INTRAMUSCULAR

## 2015-09-04 MED ORDER — CEPHALEXIN 500 MG PO CAPS: 500.0000 mg | ORAL_CAPSULE | Freq: Four times a day (QID) | ORAL | Status: DC

## 2015-09-04 MED ORDER — STERILE WATER FOR INJECTION IJ SOLN
INTRAMUSCULAR | Status: AC
  Filled 2015-09-04: qty 10

## 2015-09-04 NOTE — Discharge Instructions (Signed)
Catheter-Associated Urinary Tract Infection FAQs  What is "catheter-associated urinary tract infection"?  A urinary tract infection (also called "UTI") is an infection in the urinary system, which includes the bladder (which stores the urine) and the kidneys (which filter the blood to make urine). Germs (for example, bacteria or yeasts) do not normally live in these areas; but if germs are introduced, an infection can occur.  If you have a urinary catheter, germs can travel along the catheter and cause an infection in your bladder or your kidney; in that case it is called a catheter-associated urinary tract infection (or "CA-UTI").   What is a urinary catheter?  A urinary catheter is a thin tube placed in the bladder to drain urine. Urine drains through the tube into a bag that collects the urine. A urinary catheter may be used:  · If you are not able to urinate on your own  · To measure the amount of urine that you make, for example, during intensive care  · During and after some types of surgery  · During some tests of the kidneys and bladder  People with urinary catheters have a much higher chance of getting a urinary tract infection than people who don't have a catheter.  How do I get a catheter-associated urinary tract infection (CA-UTI)?  If germs enter the urinary tract, they may cause an infection. Many of the germs that cause a catheter-associated urinary tract infection are common germs found in your intestines that do not usually cause an infection there. Germs can enter the urinary tract when the catheter is being put in or while the catheter remains in the bladder.   What are the symptoms of a urinary tract infection?  Some of the common symptoms of a urinary tract infection are:  · Burning or pain in the lower abdomen (that is, below the stomach)  · Fever  · Bloody urine may be a sign of infection, but is also caused by other problems  · Burning during urination or an increase in the frequency of  urination after the catheter is removed.  Sometimes people with catheter-associated urinary tract infections do not have these symptoms of infection.  Can catheter-associated urinary tract infections be treated?  Yes, most catheter-associated urinary tract infections can be treated with antibiotics and removal or change of the catheter. Your doctor will determine which antibiotic is best for you.   What are some of the things that hospitals are doing to prevent catheter-associated urinary tract infections?  To prevent urinary tract infections, doctors and nurses take the following actions.   Catheter insertion  · External catheters in men (these look like condoms and are placed over the penis rather than into the penis)  · Putting a temporary catheter in to drain the urine and removing it right away. This is called intermittent urethral catheterization.  Catheter care  What can I do to help prevent catheter-associated urinary tract infections if I have a catheter?  · Always clean your hands before and after doing catheter care.  · Always keep your urine bag below the level of your bladder.  · Do not tug or pull on the tubing.  · Do not twist or kink the catheter tubing.  · Ask your healthcare provider each day if you still need the catheter.  What do I need to do when I go home from the hospital?  · If you will be going home with a catheter, your doctor or nurse should explain everything   you need to know about taking care of the catheter. Make sure you understand how to care for it before you leave the hospital.  · If you develop any of the symptoms of a urinary tract infection, such as burning or pain in the lower abdomen, fever, or an increase in the frequency of urination, contact your doctor or nurse immediately.  · Before you go home, make sure you know who to contact if you have questions or problems after you get home.  If you have questions, please ask your doctor or nurse.  Developed and co-sponsored by The  Society for Healthcare Epidemiology of America (SHEA); Infectious Diseases Society of America (IDSA); American Hospital Association; Association for Professionals in Infection Control and Epidemiology (APIC); Centers for Disease Control and Prevention (CDC); and The Joint Commission.     This information is not intended to replace advice given to you by your health care provider. Make sure you discuss any questions you have with your health care provider.     Document Released: 03/17/2012 Document Revised: 11/07/2014 Document Reviewed: 09/06/2014  Elsevier Interactive Patient Education ©2016 [employer]
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
## 2015-09-05 NOTE — Progress Notes (Signed)
This encounter was created in error - please disregard.

## 2015-09-06 ENCOUNTER — Other Ambulatory Visit: Payer: Self-pay | Admitting: Internal Medicine

## 2015-09-06 DIAGNOSIS — M869 Osteomyelitis, unspecified: Secondary | ICD-10-CM

## 2015-09-06 LAB — URINE CULTURE: Special Requests: NORMAL

## 2015-09-07 ENCOUNTER — Telehealth (HOSPITAL_COMMUNITY): Payer: Self-pay

## 2015-09-07 NOTE — Telephone Encounter (Signed)
Urine culture [facility] [phone]
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
## 2015-09-14 ENCOUNTER — Emergency Department (HOSPITAL_COMMUNITY): Payer: Medicaid Other

## 2015-09-14 ENCOUNTER — Encounter (HOSPITAL_COMMUNITY): Payer: Self-pay | Admitting: Emergency Medicine

## 2015-09-14 ENCOUNTER — Emergency Department (HOSPITAL_COMMUNITY)
Admission: EM | Admit: 2015-09-14 | Discharge: 2015-09-15 | Disposition: A | Discharge status: Home / Self Care | Payer: Medicaid Other | Attending: Emergency Medicine | Admitting: Emergency Medicine

## 2015-09-14 DIAGNOSIS — Z872 Personal history of diseases of the skin and subcutaneous tissue: Secondary | ICD-10-CM | POA: Insufficient documentation

## 2015-09-14 DIAGNOSIS — G8220 Paraplegia, unspecified: Secondary | ICD-10-CM | POA: Insufficient documentation

## 2015-09-14 DIAGNOSIS — Z87440 Personal history of urinary (tract) infections: Secondary | ICD-10-CM | POA: Diagnosis not present

## 2015-09-14 DIAGNOSIS — E119 Type 2 diabetes mellitus without complications: Secondary | ICD-10-CM | POA: Diagnosis not present

## 2015-09-14 DIAGNOSIS — F419 Anxiety disorder, unspecified: Secondary | ICD-10-CM | POA: Diagnosis not present

## 2015-09-14 DIAGNOSIS — Z79899 Other long term (current) drug therapy: Secondary | ICD-10-CM | POA: Insufficient documentation

## 2015-09-14 DIAGNOSIS — F329 Major depressive disorder, single episode, unspecified: Secondary | ICD-10-CM | POA: Insufficient documentation

## 2015-09-14 DIAGNOSIS — I10 Essential (primary) hypertension: Secondary | ICD-10-CM | POA: Insufficient documentation

## 2015-09-14 DIAGNOSIS — Z8719 Personal history of other diseases of the digestive system: Secondary | ICD-10-CM | POA: Insufficient documentation

## 2015-09-14 DIAGNOSIS — Z86718 Personal history of other venous thrombosis and embolism: Secondary | ICD-10-CM | POA: Insufficient documentation

## 2015-09-14 DIAGNOSIS — Z87891 Personal history of nicotine dependence: Secondary | ICD-10-CM | POA: Insufficient documentation

## 2015-09-14 DIAGNOSIS — R079 Chest pain, unspecified: Secondary | ICD-10-CM

## 2015-09-14 DIAGNOSIS — Z862 Personal history of diseases of the blood and blood-forming organs and certain disorders involving the immune mechanism: Secondary | ICD-10-CM | POA: Diagnosis not present

## 2015-09-14 DIAGNOSIS — R0789 Other chest pain: Secondary | ICD-10-CM | POA: Insufficient documentation

## 2015-09-14 DIAGNOSIS — Z8611 Personal history of tuberculosis: Secondary | ICD-10-CM | POA: Diagnosis not present

## 2015-09-14 DIAGNOSIS — Z87828 Personal history of other (healed) physical injury and trauma: Secondary | ICD-10-CM | POA: Diagnosis not present

## 2015-09-14 LAB — CBC
HEMATOCRIT: 31.4 % — AB (ref 39.0–52.0)
Hemoglobin: 10.1 g/dL — ABNORMAL LOW (ref 13.0–17.0)
MCH: 27.4 pg (ref 26.0–34.0)
MCHC: 32.2 g/dL (ref 30.0–36.0)
MCV: 85.1 fL (ref 78.0–100.0)
Platelets: 411 10*3/uL — ABNORMAL HIGH (ref 150–400)
RBC: 3.69 MIL/uL — ABNORMAL LOW (ref 4.22–5.81)
RDW: 15.6 % — AB (ref 11.5–15.5)
WBC: 5.3 10*3/uL (ref 4.0–10.5)

## 2015-09-14 LAB — BASIC METABOLIC PANEL
Anion gap: 10 (ref 5–15)
BUN: 10 mg/dL (ref 6–20)
CALCIUM: 9.6 mg/dL (ref 8.9–10.3)
CO2: 27 mmol/L (ref 22–32)
Chloride: 100 mmol/L — ABNORMAL LOW (ref 101–111)
Creatinine, Ser: 0.58 mg/dL — ABNORMAL LOW (ref 0.61–1.24)
GFR calc Af Amer: >60 mL/min (ref >60)
GLUCOSE: 114 mg/dL — AB (ref 65–99)
Potassium: 3.5 mmol/L (ref 3.5–5.1)
Sodium: 137 mmol/L (ref 135–145)

## 2015-09-14 LAB — CBG MONITORING, ED: Glucose-Capillary: 115 mg/dL — ABNORMAL HIGH (ref 65–99)

## 2015-09-14 LAB — I-STAT TROPONIN, ED: TROPONIN I, POC: 0.01 ng/mL (ref 0.00–0.08)

## 2015-09-14 MED ORDER — FENTANYL CITRATE (PF) 100 MCG/2ML IJ SOLN: 50.0000 ug | Freq: Once | INTRAMUSCULAR | Status: DC

## 2015-09-14 MED ORDER — IOHEXOL 350 MG/ML SOLN
80.0000 mL | Freq: Once | INTRAVENOUS | Status: AC | PRN
  Administered 2015-09-14: 100 mL via INTRAVENOUS
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
## 2015-09-14 NOTE — ED Notes (Signed)
Pt. arrived with EMS from [place] home reports central chest tightness with mild SOB onset this evening , denies nausea or diaphoresis , received 4 baby ASA prior to arrival .

## 2015-09-15 LAB — I-STAT TROPONIN, ED: Troponin i, poc: 0 ng/mL (ref 0.00–0.08)

## 2015-09-15 NOTE — ED Notes (Signed)
Report given to Nancee Liter at [facility] .

## 2015-09-15 NOTE — Discharge Instructions (Signed)
Nonspecific Chest Pain  °Chest pain can be caused by many different conditions. There is always a chance that your pain could be related to something serious, such as a heart attack or a blood clot in your lungs. Chest pain can also be caused by conditions that are not life-threatening. If you have chest pain, it is very important to follow up with your health care provider. °CAUSES  °Chest pain can be caused by: °· Heartburn. °· Pneumonia or bronchitis. °· Anxiety or stress. °· Inflammation around your heart (pericarditis) or lung (pleuritis or pleurisy). °· A blood clot in your lung. °· A collapsed lung (pneumothorax). It can develop suddenly on its own (spontaneous pneumothorax) or from trauma to the chest. °· Shingles infection (varicella-zoster virus). °· Heart attack. °· Damage to the bones, muscles, and cartilage that make up your chest wall. This can include: °¨ Bruised bones due to injury. °¨ Strained muscles or cartilage due to frequent or repeated coughing or overwork. °¨ Fracture to one or more ribs. °¨ Sore cartilage due to inflammation (costochondritis). °RISK FACTORS  °Risk factors for chest pain may include: °· Activities that increase your risk for trauma or injury to your chest. °· Respiratory infections or conditions that cause frequent coughing. °· Medical conditions or overeating that can cause heartburn. °· Heart disease or family history of heart disease. °· Conditions or health behaviors that increase your risk of developing a blood clot. °· Having had chicken pox (varicella zoster). °SIGNS AND SYMPTOMS °Chest pain can feel like: °· Burning or tingling on the surface of your chest or deep in your chest. °· Crushing, pressure, aching, or squeezing pain. °· Dull or sharp pain that is worse when you move, cough, or take a deep breath. °· Pain that is also felt in your back, neck, shoulder, or arm, or pain that spreads to any of these areas. °Your chest pain may come and go, or it may stay  constant. °DIAGNOSIS °Lab tests or other studies may be needed to find the cause of your pain. Your health care provider may have you take a test called an ambulatory ECG (electrocardiogram). An ECG records your heartbeat patterns at the time the test is performed. You may also have other tests, such as: °· Transthoracic echocardiogram (TTE). During echocardiography, sound waves are used to create a picture of all of the heart structures and to look at how blood flows through your heart. °· Transesophageal echocardiogram (TEE). This is a more advanced imaging test that obtains images from inside your body. It allows your health care provider to see your heart in finer detail. °· Cardiac monitoring. This allows your health care provider to monitor your heart rate and rhythm in real time. °· Holter monitor. This is a portable device that records your heartbeat and can help to diagnose abnormal heartbeats. It allows your health care provider to track your heart activity for several days, if needed. °· Stress tests. These can be done through exercise or by taking medicine that makes your heart beat more quickly. °· Blood tests. °· Imaging tests. °TREATMENT  °Your treatment depends on what is causing your chest pain. Treatment may include: °· Medicines. These may include: °¨ Acid blockers for heartburn. °¨ Anti-inflammatory medicine. °¨ Pain medicine for inflammatory conditions. °¨ Antibiotic medicine, if an infection is present. °¨ Medicines to dissolve blood clots. °¨ Medicines to treat coronary artery disease. °· Supportive care for conditions that do not require medicines. This may include: °¨ Resting. °¨ Applying heat   or cold packs to injured areas. °¨ Limiting activities until pain decreases. °HOME CARE INSTRUCTIONS °· If you were prescribed an antibiotic medicine, finish it all even if you start to feel better. °· Avoid any activities that bring on chest pain. °· Do not use any tobacco products, including  cigarettes, chewing tobacco, or electronic cigarettes. If you need help quitting, ask your health care provider. °· Do not drink alcohol. °· Take medicines only as directed by your health care provider. °· Keep all follow-up visits as directed by your health care provider. This is important. This includes any further testing if your chest pain does not go away. °· If heartburn is the cause for your chest pain, you may be told to keep your head raised (elevated) while sleeping. This reduces the chance that acid will go from your stomach into your esophagus. °· Make lifestyle changes as directed by your health care provider. These may include: °¨ Getting regular exercise. Ask your health care provider to suggest some activities that are safe for you. °¨ Eating a heart-healthy diet. A registered dietitian can help you to learn healthy eating options. °¨ Maintaining a healthy weight. °¨ Managing diabetes, if necessary. °¨ Reducing stress. °SEEK MEDICAL CARE IF: °· Your chest pain does not go away after treatment. °· You have a rash with blisters on your chest. °· You have a fever. °SEEK IMMEDIATE MEDICAL CARE IF:  °· Your chest pain is worse. °· You have an increasing cough, or you cough up blood. °· You have severe abdominal pain. °· You have severe weakness. °· You faint. °· You have chills. °· You have sudden, unexplained chest discomfort. °· You have sudden, unexplained discomfort in your arms, back, neck, or jaw. °· You have shortness of breath at any time. °· You suddenly start to sweat, or your skin gets clammy. °· You feel nauseous or you vomit. °· You suddenly feel light-headed or dizzy. °· Your heart begins to beat quickly, or it feels like it is skipping beats. °These symptoms may represent a serious problem that is an emergency. Do not wait to see if the symptoms will go away. Get medical help right away. Call your local emergency services (911 in the [place] Do not drive yourself to the hospital. °  °This  information is not intended to replace advice given to you by your health care provider. Make sure you discuss any questions you have with your health care provider. °  °Document Released: 04/02/2005 Document Revised: 07/14/2014 Document Reviewed: 01/27/2014 °[employer] Interactive Patient Education ©2016 [employer] ° °

## 2015-09-17 ENCOUNTER — Ambulatory Visit (HOSPITAL_COMMUNITY)
Admission: RE | Admit: 2015-09-17 | Discharge: 2015-09-17 | Disposition: A | Discharge status: Home / Self Care | Payer: Medicaid Other | Source: Ambulatory Visit | Attending: Internal Medicine | Admitting: Internal Medicine

## 2015-09-17 DIAGNOSIS — M868X7 Other osteomyelitis, ankle and foot: Secondary | ICD-10-CM | POA: Diagnosis not present

## 2015-09-17 DIAGNOSIS — M869 Osteomyelitis, unspecified: Secondary | ICD-10-CM

## 2015-09-17 DIAGNOSIS — L97529 Non-pressure chronic ulcer of other part of left foot with unspecified severity: Secondary | ICD-10-CM | POA: Diagnosis not present

## 2015-09-17 MED ORDER — GADOBENATE DIMEGLUMINE 529 MG/ML IV SOLN
12.0000 mL | Freq: Once | INTRAVENOUS | Status: AC | PRN
  Administered 2015-09-17: 12 mL via INTRAVENOUS

## 2015-09-24 ENCOUNTER — Ambulatory Visit (HOSPITAL_COMMUNITY): Payer: Medicaid Other

## 2015-09-24 NOTE — Progress Notes (Signed)
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 DETECTED Final  . Barbiturates 01/12/2015 NONE DETECTED  NONE DETECTED Final   Comment:        DRUG SCREEN FOR MEDICAL PURPOSES ONLY.  IF CONFIRMATION IS NEEDED FOR ANY PURPOSE, NOTIFY LAB WITHIN 5 DAYS.        LOWEST DETECTABLE LIMITS FOR URINE DRUG SCREEN Drug Class       Cutoff (ng/mL) Amphetamine      1000 Barbiturate      200 Benzodiazepine   916 Tricyclics       945 Opiates          300 Cocaine          300 THC              50   . TSH 01/12/2015 0.875  0.350 - 4.500 uIU/mL Final  . Squamous Epithelial / LPF 01/12/2015 RARE  RARE Final  . WBC, UA 01/12/2015 0-2  <3 WBC/hpf Final  . Bacteria, UA 01/12/2015 FEW* RARE Final  . Color, Urine 01/14/2015 YELLOW  YELLOW Final  .  APPearance 01/14/2015 CLEAR  CLEAR Final  . Specific Gravity, Urine 01/14/2015 1.006  1.005 - 1.030 Final  . pH 01/14/2015 6.0  5.0 - 8.0 Final  . Glucose, UA 01/14/2015 NEGATIVE  NEGATIVE mg/dL Final  . Hgb urine dipstick 01/14/2015 NEGATIVE  NEGATIVE Final  . Bilirubin Urine 01/14/2015 NEGATIVE  NEGATIVE Final  . Ketones, ur 01/14/2015 NEGATIVE  NEGATIVE mg/dL Final  . Protein, ur 01/14/2015 NEGATIVE  NEGATIVE mg/dL Final  . Urobilinogen, UA 01/14/2015 0.2  0.0 - 1.0 mg/dL Final  . Nitrite 01/14/2015 NEGATIVE  NEGATIVE Final  . Leukocytes, UA 01/14/2015 TRACE* NEGATIVE Final  . Squamous Epithelial / LPF 01/14/2015 RARE  RARE Final  . WBC, UA 01/14/2015 0-2  <3 WBC/hpf Final  . RBC / HPF 01/14/2015 0-2  <3 RBC/hpf Final  . Bacteria, UA 01/14/2015 RARE  RARE Final  . Sodium 01/15/2015 133* 135 - 145 mmol/L Final  . Potassium 01/15/2015 4.0  3.5 - 5.1 mmol/L Final  . Chloride 01/15/2015 94* 101 - 111 mmol/L Final  . BUN 01/15/2015 12  6 - 20 mg/dL Final  . Creatinine, Ser 01/15/2015 0.80  0.61 - 1.24 mg/dL Final  . Glucose, Bld 01/15/2015 113* 65 - 99 mg/dL Final  . Calcium, Ion 01/15/2015 1.24* 1.12 - 1.23 mmol/L Final  . TCO2 01/15/2015 26  0 - 100 mmol/L Final  . Hemoglobin 01/15/2015 15.6  13.0 - 17.0 g/dL Final  . HCT 01/15/2015 46.0  39.0 - 52.0 % Final      Assessment/Plan   ICD-9-CM ICD-10-CM   1. UTI (lower urinary tract infection) 599.0 N39.0   2. Neurogenic bladder 596.54 N31.9   3. Paraplegia (HCC) 344.1 G82.20   4. MDD (major depressive disorder), recurrent, severe, with psychosis ([place]) 296.34 F33.3   5. FTT (failure to thrive) in adult 783.7 R62.7  6. Chronic pain syndrome 338.4 G89.4    Psych services to follow  Push fluids  Encourage OOB at least 1 time daily  Wound care to follow  Cont current meds as ordered  PT/OT/ST as ordered  Nutritional supplements as ordered  F/u with specialists as scheduled  GOAL: short term rehab then continue long term  care. Communicated with pt and nursing.  Will follow  Liddie Chichester S. Perlie Gold  [facility] [address] [place], [place] [address] [phone] Cell (Monday-Friday 8 AM - 5 PM) [phone] After 5 PM and follow prompts

## 2015-10-01 ENCOUNTER — Encounter: Payer: Self-pay | Admitting: Adult Health

## 2015-10-01 ENCOUNTER — Non-Acute Institutional Stay (SKILLED_NURSING_FACILITY): Payer: Medicaid Other | Admitting: Adult Health

## 2015-10-01 DIAGNOSIS — Z5329 Procedure and treatment not carried out because of patient's decision for other reasons: Secondary | ICD-10-CM

## 2015-10-01 DIAGNOSIS — N319 Neuromuscular dysfunction of bladder, unspecified: Secondary | ICD-10-CM | POA: Diagnosis not present

## 2015-10-01 DIAGNOSIS — G894 Chronic pain syndrome: Secondary | ICD-10-CM

## 2015-10-01 DIAGNOSIS — Z978 Presence of other specified devices: Secondary | ICD-10-CM

## 2015-10-01 DIAGNOSIS — G839 Paralytic syndrome, unspecified: Secondary | ICD-10-CM

## 2015-10-01 DIAGNOSIS — Z9289 Personal history of other medical treatment: Secondary | ICD-10-CM

## 2015-10-01 DIAGNOSIS — L89894 Pressure ulcer of other site, stage 4: Secondary | ICD-10-CM

## 2015-10-01 DIAGNOSIS — F333 Major depressive disorder, recurrent, severe with psychotic symptoms: Secondary | ICD-10-CM

## 2015-10-01 DIAGNOSIS — Z960 Presence of urogenital implants: Secondary | ICD-10-CM

## 2015-10-01 DIAGNOSIS — K219 Gastro-esophageal reflux disease without esophagitis: Secondary | ICD-10-CM

## 2015-10-01 DIAGNOSIS — K5901 Slow transit constipation: Secondary | ICD-10-CM

## 2015-10-01 DIAGNOSIS — G8220 Paraplegia, unspecified: Secondary | ICD-10-CM | POA: Diagnosis not present

## 2015-10-01 DIAGNOSIS — M86172 Other acute osteomyelitis, left ankle and foot: Secondary | ICD-10-CM | POA: Insufficient documentation
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
## 2015-10-08 ENCOUNTER — Other Ambulatory Visit: Payer: Self-pay | Admitting: *Deleted

## 2015-10-08 LAB — BASIC METABOLIC PANEL
BUN: 8 mg/dL (ref 4–21)
Creatinine: 0.6 mg/dL (ref 0.6–1.3)
Glucose: 86 mg/dL
Potassium: 3.5 mmol/L (ref 3.4–5.3)
Sodium: 139 mmol/L (ref 137–147)

## 2015-10-08 MED ORDER — OXYCODONE HCL 15 MG PO TABS: 7.5000 mg | ORAL_TABLET | ORAL | Status: DC | PRN

## 2015-10-08 NOTE — Telephone Encounter (Signed)
[facility] 

## 2015-10-09 ENCOUNTER — Other Ambulatory Visit: Payer: Self-pay

## 2015-10-17 ENCOUNTER — Encounter: Payer: Self-pay | Admitting: Adult Health

## 2015-10-17 ENCOUNTER — Non-Acute Institutional Stay (SKILLED_NURSING_FACILITY): Payer: Medicaid Other | Admitting: Adult Health

## 2015-10-17 DIAGNOSIS — E43 Unspecified severe protein-calorie malnutrition: Secondary | ICD-10-CM

## 2015-10-17 DIAGNOSIS — L89894 Pressure ulcer of other site, stage 4: Secondary | ICD-10-CM | POA: Diagnosis not present

## 2015-10-17 DIAGNOSIS — G839 Paralytic syndrome, unspecified: Secondary | ICD-10-CM

## 2015-10-17 DIAGNOSIS — Z5329 Procedure and treatment not carried out because of patient's decision for other reasons: Secondary | ICD-10-CM

## 2015-10-17 DIAGNOSIS — G894 Chronic pain syndrome: Secondary | ICD-10-CM

## 2015-10-17 DIAGNOSIS — F333 Major depressive disorder, recurrent, severe with psychotic symptoms: Secondary | ICD-10-CM
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
## 2015-11-01 ENCOUNTER — Non-Acute Institutional Stay (SKILLED_NURSING_FACILITY): Payer: Medicaid Other | Admitting: Adult Health

## 2015-11-01 ENCOUNTER — Encounter: Payer: Self-pay | Admitting: Adult Health

## 2015-11-01 DIAGNOSIS — Z960 Presence of urogenital implants: Secondary | ICD-10-CM

## 2015-11-01 DIAGNOSIS — Z5329 Procedure and treatment not carried out because of patient's decision for other reasons: Secondary | ICD-10-CM

## 2015-11-01 DIAGNOSIS — G839 Paralytic syndrome, unspecified: Secondary | ICD-10-CM | POA: Diagnosis not present

## 2015-11-01 DIAGNOSIS — G894 Chronic pain syndrome: Secondary | ICD-10-CM | POA: Diagnosis not present

## 2015-11-01 DIAGNOSIS — F22 Delusional disorders: Secondary | ICD-10-CM

## 2015-11-01 DIAGNOSIS — Z9289 Personal history of other medical treatment: Secondary | ICD-10-CM

## 2015-11-01 DIAGNOSIS — E43 Unspecified severe protein-calorie malnutrition: Secondary | ICD-10-CM | POA: Diagnosis not present

## 2015-11-01 DIAGNOSIS — F333 Major depressive disorder, recurrent, severe with psychotic symptoms: Secondary | ICD-10-CM | POA: Diagnosis not present

## 2015-11-01 DIAGNOSIS — Z95828 Presence of other vascular implants and grafts: Secondary | ICD-10-CM | POA: Diagnosis not present

## 2015-11-01 DIAGNOSIS — Z978 Presence of other specified devices: Secondary | ICD-10-CM
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
## 2015-11-02 ENCOUNTER — Non-Acute Institutional Stay (SKILLED_NURSING_FACILITY): Payer: Medicaid Other | Admitting: Adult Health

## 2015-11-02 ENCOUNTER — Inpatient Hospital Stay (HOSPITAL_COMMUNITY)
Admission: EM | Admit: 2015-11-02 | Discharge: 2015-11-08 | DRG: 640 | Disposition: A | Discharge status: Skilled Nursing Facility | Payer: Medicaid Other | Attending: Internal Medicine | Admitting: Internal Medicine

## 2015-11-02 ENCOUNTER — Encounter (HOSPITAL_COMMUNITY): Payer: Self-pay | Admitting: Internal Medicine

## 2015-11-02 ENCOUNTER — Encounter: Payer: Self-pay | Admitting: Adult Health

## 2015-11-02 DIAGNOSIS — K5900 Constipation, unspecified: Secondary | ICD-10-CM | POA: Diagnosis not present

## 2015-11-02 DIAGNOSIS — R64 Cachexia: Secondary | ICD-10-CM | POA: Diagnosis present

## 2015-11-02 DIAGNOSIS — Z22322 Carrier or suspected carrier of Methicillin resistant Staphylococcus aureus: Secondary | ICD-10-CM

## 2015-11-02 DIAGNOSIS — E876 Hypokalemia: Principal | ICD-10-CM | POA: Diagnosis present

## 2015-11-02 DIAGNOSIS — Z79899 Other long term (current) drug therapy: Secondary | ICD-10-CM

## 2015-11-02 DIAGNOSIS — R627 Adult failure to thrive: Secondary | ICD-10-CM | POA: Diagnosis present

## 2015-11-02 DIAGNOSIS — K5901 Slow transit constipation: Secondary | ICD-10-CM

## 2015-11-02 DIAGNOSIS — Z681 Body mass index (BMI) 19 or less, adult: Secondary | ICD-10-CM | POA: Diagnosis not present

## 2015-11-02 DIAGNOSIS — Z8611 Personal history of tuberculosis: Secondary | ICD-10-CM | POA: Diagnosis not present

## 2015-11-02 DIAGNOSIS — R079 Chest pain, unspecified: Secondary | ICD-10-CM | POA: Diagnosis not present

## 2015-11-02 DIAGNOSIS — R7989 Other specified abnormal findings of blood chemistry: Secondary | ICD-10-CM | POA: Diagnosis present

## 2015-11-02 DIAGNOSIS — Z87440 Personal history of urinary (tract) infections: Secondary | ICD-10-CM | POA: Diagnosis not present

## 2015-11-02 DIAGNOSIS — F333 Major depressive disorder, recurrent, severe with psychotic symptoms: Secondary | ICD-10-CM | POA: Diagnosis present

## 2015-11-02 DIAGNOSIS — R1011 Right upper quadrant pain: Secondary | ICD-10-CM | POA: Diagnosis not present

## 2015-11-02 DIAGNOSIS — E860 Dehydration: Secondary | ICD-10-CM | POA: Diagnosis present

## 2015-11-02 DIAGNOSIS — Z7401 Bed confinement status: Secondary | ICD-10-CM

## 2015-11-02 DIAGNOSIS — G8929 Other chronic pain: Secondary | ICD-10-CM | POA: Diagnosis present

## 2015-11-02 DIAGNOSIS — G839 Paralytic syndrome, unspecified: Secondary | ICD-10-CM

## 2015-11-02 DIAGNOSIS — G894 Chronic pain syndrome: Secondary | ICD-10-CM

## 2015-11-02 DIAGNOSIS — M86172 Other acute osteomyelitis, left ankle and foot: Secondary | ICD-10-CM | POA: Diagnosis present

## 2015-11-02 DIAGNOSIS — Z9119 Patient's noncompliance with other medical treatment and regimen: Secondary | ICD-10-CM | POA: Diagnosis not present

## 2015-11-02 DIAGNOSIS — R197 Diarrhea, unspecified: Secondary | ICD-10-CM | POA: Diagnosis present

## 2015-11-02 DIAGNOSIS — F22 Delusional disorders: Secondary | ICD-10-CM

## 2015-11-02 DIAGNOSIS — E870 Hyperosmolality and hypernatremia: Secondary | ICD-10-CM | POA: Diagnosis present

## 2015-11-02 DIAGNOSIS — Z960 Presence of urogenital implants: Secondary | ICD-10-CM

## 2015-11-02 DIAGNOSIS — E871 Hypo-osmolality and hyponatremia: Secondary | ICD-10-CM | POA: Diagnosis not present

## 2015-11-02 DIAGNOSIS — K219 Gastro-esophageal reflux disease without esophagitis: Secondary | ICD-10-CM | POA: Diagnosis present

## 2015-11-02 DIAGNOSIS — G8389 Other specified paralytic syndromes: Secondary | ICD-10-CM | POA: Diagnosis present

## 2015-11-02 DIAGNOSIS — Z8249 Family history of ischemic heart disease and other diseases of the circulatory system: Secondary | ICD-10-CM

## 2015-11-02 DIAGNOSIS — L8990 Pressure ulcer of unspecified site, unspecified stage: Secondary | ICD-10-CM | POA: Diagnosis present

## 2015-11-02 DIAGNOSIS — Z95828 Presence of other vascular implants and grafts: Secondary | ICD-10-CM | POA: Diagnosis not present

## 2015-11-02 DIAGNOSIS — R748 Abnormal levels of other serum enzymes: Secondary | ICD-10-CM | POA: Diagnosis present

## 2015-11-02 DIAGNOSIS — R509 Fever, unspecified: Secondary | ICD-10-CM

## 2015-11-02 DIAGNOSIS — F419 Anxiety disorder, unspecified: Secondary | ICD-10-CM | POA: Diagnosis present

## 2015-11-02 DIAGNOSIS — R109 Unspecified abdominal pain: Secondary | ICD-10-CM

## 2015-11-02 DIAGNOSIS — Z5320 Procedure and treatment not carried out because of patient's decision for unspecified reasons: Secondary | ICD-10-CM | POA: Diagnosis present

## 2015-11-02 DIAGNOSIS — W3400XS Accidental discharge from unspecified firearms or gun, sequela: Secondary | ICD-10-CM

## 2015-11-02 DIAGNOSIS — Z993 Dependence on wheelchair: Secondary | ICD-10-CM

## 2015-11-02 DIAGNOSIS — Z86718 Personal history of other venous thrombosis and embolism: Secondary | ICD-10-CM | POA: Diagnosis not present

## 2015-11-02 DIAGNOSIS — F509 Eating disorder, unspecified: Secondary | ICD-10-CM | POA: Diagnosis present

## 2015-11-02 DIAGNOSIS — N319 Neuromuscular dysfunction of bladder, unspecified: Secondary | ICD-10-CM | POA: Diagnosis present

## 2015-11-02 DIAGNOSIS — R9431 Abnormal electrocardiogram [ECG] [EKG]: Secondary | ICD-10-CM | POA: Diagnosis present

## 2015-11-02 DIAGNOSIS — Z87891 Personal history of nicotine dependence: Secondary | ICD-10-CM

## 2015-11-02 DIAGNOSIS — Z9289 Personal history of other medical treatment: Secondary | ICD-10-CM | POA: Diagnosis not present

## 2015-11-02 DIAGNOSIS — E43 Unspecified severe protein-calorie malnutrition: Secondary | ICD-10-CM | POA: Diagnosis present

## 2015-11-02 DIAGNOSIS — R778 Other specified abnormalities of plasma proteins: Secondary | ICD-10-CM | POA: Diagnosis present

## 2015-11-02 DIAGNOSIS — Z5329 Procedure and treatment not carried out because of patient's decision for other reasons: Secondary | ICD-10-CM

## 2015-11-02 DIAGNOSIS — Z978 Presence of other specified devices: Secondary | ICD-10-CM

## 2015-11-02 HISTORY — DX: Abnormal electrocardiogram (ECG) (EKG): R94.31

## 2015-11-02 LAB — URINE MICROSCOPIC-ADD ON

## 2015-11-02 LAB — CBC
HCT: 36.4 % — ABNORMAL LOW (ref 39.0–52.0)
Hemoglobin: 11.9 g/dL — ABNORMAL LOW (ref 13.0–17.0)
MCH: 27.4 pg (ref 26.0–34.0)
MCHC: 32.7 g/dL (ref 30.0–36.0)
MCV: 83.9 fL (ref 78.0–100.0)
Platelets: 357 10*3/uL (ref 150–400)
RBC: 4.34 MIL/uL (ref 4.22–5.81)
RDW: 14.3 % (ref 11.5–15.5)
WBC: 9.4 10*3/uL (ref 4.0–10.5)

## 2015-11-02 LAB — I-STAT CG4 LACTIC ACID, ED: Lactic Acid, Venous: 1.7 mmol/L (ref 0.5–2.0)

## 2015-11-02 LAB — COMPREHENSIVE METABOLIC PANEL
ALT: 14 U/L — ABNORMAL LOW (ref 17–63)
AST: 26 U/L (ref 15–41)
Albumin: 3.4 g/dL — ABNORMAL LOW (ref 3.5–5.0)
Alkaline Phosphatase: 74 U/L (ref 38–126)
Anion gap: 18 — ABNORMAL HIGH (ref 5–15)
BUN: 8 mg/dL (ref 6–20)
CO2: 31 mmol/L (ref 22–32)
Calcium: 10 mg/dL (ref 8.9–10.3)
Chloride: 92 mmol/L — ABNORMAL LOW (ref 101–111)
Creatinine, Ser: 0.85 mg/dL (ref 0.61–1.24)
GFR calc Af Amer: >60 mL/min (ref >60)
GFR calc non Af Amer: >60 mL/min (ref >60)
Glucose, Bld: 97 mg/dL (ref 65–99)
Potassium: <2 mmol/L — CL (ref 3.5–5.1)
Sodium: 141 mmol/L (ref 135–145)
Total Bilirubin: 0.9 mg/dL (ref 0.3–1.2)
Total Protein: 9 g/dL — ABNORMAL HIGH (ref 6.5–8.1)

## 2015-11-02 LAB — BASIC METABOLIC PANEL
Anion gap: 20 — ABNORMAL HIGH (ref 5–15)
BUN: 7 mg/dL (ref 6–20)
CO2: 30 mmol/L (ref 22–32)
Calcium: 10.3 mg/dL (ref 8.9–10.3)
Chloride: 91 mmol/L — ABNORMAL LOW (ref 101–111)
Creatinine, Ser: 0.87 mg/dL (ref 0.61–1.24)
GFR calc Af Amer: >60 mL/min (ref >60)
GFR calc non Af Amer: >60 mL/min (ref >60)
Glucose, Bld: 122 mg/dL — ABNORMAL HIGH (ref 65–99)
Potassium: <2 mmol/L — CL (ref 3.5–5.1)
Sodium: 141 mmol/L (ref 135–145)

## 2015-11-02 LAB — RAPID URINE DRUG SCREEN, HOSP PERFORMED
Amphetamines: NOT DETECTED
Barbiturates: NOT DETECTED
Benzodiazepines: NOT DETECTED
Cocaine: NOT DETECTED
Opiates: NOT DETECTED
Tetrahydrocannabinol: NOT DETECTED

## 2015-11-02 LAB — URINALYSIS, ROUTINE W REFLEX MICROSCOPIC
Bilirubin Urine: NEGATIVE
Glucose, UA: NEGATIVE mg/dL
Ketones, ur: >80 mg/dL — AB
Nitrite: NEGATIVE
Protein, ur: 30 mg/dL — AB
Specific Gravity, Urine: 1.009 (ref 1.005–1.030)
pH: 6.5 (ref 5.0–8.0)

## 2015-11-02 LAB — ACETAMINOPHEN LEVEL: Acetaminophen (Tylenol), Serum: <10 ug/mL — ABNORMAL LOW (ref 10–30)

## 2015-11-02 LAB — SALICYLATE LEVEL: Salicylate Lvl: <4 mg/dL (ref 2.8–30.0)

## 2015-11-02 LAB — MAGNESIUM: Magnesium: 1.9 mg/dL (ref 1.7–2.4)

## 2015-11-02 LAB — TROPONIN I: Troponin I: 2.91 ng/mL (ref <0.031)

## 2015-11-02 LAB — ETHANOL: Alcohol, Ethyl (B): <5 mg/dL (ref <5)

## 2015-11-02 MED ORDER — SODIUM CHLORIDE 0.9 % IV SOLN
Freq: Once | INTRAVENOUS | Status: AC
  Administered 2015-11-02: 22:00:00 via INTRAVENOUS

## 2015-11-02 MED ORDER — OXYCODONE HCL 5 MG PO TABS
7.5000 mg | ORAL_TABLET | ORAL | Status: DC | PRN
  Administered 2015-11-02 – 2015-11-07 (×13): 7.5 mg via ORAL
  Filled 2015-11-02 (×15): qty 2

## 2015-11-02 MED ORDER — POTASSIUM CHLORIDE 20 MEQ/15ML (10%) PO SOLN
40.0000 meq | Freq: Once | ORAL | Status: AC
  Administered 2015-11-02: 40 meq via ORAL
  Filled 2015-11-02: qty 30

## 2015-11-02 MED ORDER — POTASSIUM CHLORIDE 10 MEQ/100ML IV SOLN
10.0000 meq | INTRAVENOUS | Status: AC
  Administered 2015-11-02 – 2015-11-03 (×6): 10 meq via INTRAVENOUS
  Filled 2015-11-02 (×5): qty 100
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
## 2015-11-02 NOTE — ED Notes (Signed)
Per EMS, pt from [facility], with IVC  Papers.  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  Pt would not let staff bathe him.  He is paranoid, thinks people are trying to poison him.  Pt is refusing to eat or drink or take any of his meds.

## 2015-11-02 NOTE — ED Notes (Signed)
Pt states he was sent here "for an evaluation, i'm barely holding on, I'm sick, I have an infection in my blood" Pt denies SI or HI.
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
## 2015-11-02 NOTE — ED Notes (Signed)
kohut MD at bedside.

## 2015-11-02 NOTE — ED Notes (Signed)
Catheter is clamped at this time in efforts to obtain urine sample
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
## 2015-11-02 NOTE — ED Notes (Signed)
On initial assessment, the pt noted to be odorous and stool all the way up his back, asked pt why he was not letting staff clean him up, he said because he did not feel good and he was sick. Discussed with pt that he had to be cleaned up at this time. Pt laying in  large amounts of soft brown stool and all up through his back, coated on foley catheter tubing. Pt has pressure sores to the buttocks area. Pt cleansed thoroughly by ed staff, linens changed, gown changed, comfort measures.

## 2015-11-03 ENCOUNTER — Inpatient Hospital Stay (HOSPITAL_COMMUNITY): Payer: Medicaid Other

## 2015-11-03 ENCOUNTER — Encounter (HOSPITAL_COMMUNITY): Payer: Self-pay | Admitting: Cardiology

## 2015-11-03 DIAGNOSIS — E43 Unspecified severe protein-calorie malnutrition: Secondary | ICD-10-CM

## 2015-11-03 DIAGNOSIS — F333 Major depressive disorder, recurrent, severe with psychotic symptoms: Secondary | ICD-10-CM | POA: Insufficient documentation

## 2015-11-03 DIAGNOSIS — M86172 Other acute osteomyelitis, left ankle and foot: Secondary | ICD-10-CM

## 2015-11-03 DIAGNOSIS — R079 Chest pain, unspecified: Secondary | ICD-10-CM

## 2015-11-03 DIAGNOSIS — R7989 Other specified abnormal findings of blood chemistry: Secondary | ICD-10-CM

## 2015-11-03 DIAGNOSIS — N319 Neuromuscular dysfunction of bladder, unspecified: Secondary | ICD-10-CM

## 2015-11-03 DIAGNOSIS — R9431 Abnormal electrocardiogram [ECG] [EKG]: Secondary | ICD-10-CM

## 2015-11-03 HISTORY — DX: Abnormal electrocardiogram (ECG) (EKG): R94.31

## 2015-11-03 LAB — GASTROINTESTINAL PANEL BY PCR, STOOL (REPLACES STOOL CULTURE)
ADENOVIRUS F40/41: NOT DETECTED
ASTROVIRUS: NOT DETECTED
CAMPYLOBACTER SPECIES: NOT DETECTED
CYCLOSPORA CAYETANENSIS: NOT DETECTED
Cryptosporidium: NOT DETECTED
E. coli O157: NOT DETECTED
ENTEROPATHOGENIC E COLI (EPEC): NOT DETECTED
ENTEROTOXIGENIC E COLI (ETEC): NOT DETECTED
Entamoeba histolytica: NOT DETECTED
Enteroaggregative E coli (EAEC): NOT DETECTED
Giardia lamblia: NOT DETECTED
NOROVIRUS GI/GII: NOT DETECTED
PLESIMONAS SHIGELLOIDES: NOT DETECTED
ROTAVIRUS A: NOT DETECTED
SHIGA LIKE TOXIN PRODUCING E COLI (STEC): NOT DETECTED
Salmonella species: NOT DETECTED
Sapovirus (I, II, IV, and V): NOT DETECTED
Shigella/Enteroinvasive E coli (EIEC): NOT DETECTED
VIBRIO SPECIES: NOT DETECTED
Vibrio cholerae: NOT DETECTED
Yersinia enterocolitica: NOT DETECTED

## 2015-11-03 LAB — CBC
HCT: 34.7 % — ABNORMAL LOW (ref 39.0–52.0)
Hemoglobin: 11.7 g/dL — ABNORMAL LOW (ref 13.0–17.0)
MCH: 27.6 pg (ref 26.0–34.0)
MCHC: 33.7 g/dL (ref 30.0–36.0)
MCV: 81.8 fL (ref 78.0–100.0)
Platelets: 340 10*3/uL (ref 150–400)
RBC: 4.24 MIL/uL (ref 4.22–5.81)
RDW: 14.3 % (ref 11.5–15.5)
WBC: 8.9 10*3/uL (ref 4.0–10.5)

## 2015-11-03 LAB — COMPREHENSIVE METABOLIC PANEL
ALBUMIN: 3.1 g/dL — AB (ref 3.5–5.0)
ALK PHOS: 70 U/L (ref 38–126)
ALT: 15 U/L — ABNORMAL LOW (ref 17–63)
AST: 26 U/L (ref 15–41)
Anion gap: 13 (ref 5–15)
BILIRUBIN TOTAL: 0.4 mg/dL (ref 0.3–1.2)
BUN: 8 mg/dL (ref 6–20)
CALCIUM: 10 mg/dL (ref 8.9–10.3)
CO2: 34 mmol/L — ABNORMAL HIGH (ref 22–32)
Chloride: 99 mmol/L — ABNORMAL LOW (ref 101–111)
Creatinine, Ser: 0.84 mg/dL (ref 0.61–1.24)
GFR calc Af Amer: >60 mL/min (ref >60)
GFR calc non Af Amer: >60 mL/min (ref >60)
GLUCOSE: 148 mg/dL — AB (ref 65–99)
Potassium: 2.6 mmol/L — CL (ref 3.5–5.1)
Sodium: 146 mmol/L — ABNORMAL HIGH (ref 135–145)
TOTAL PROTEIN: 8.8 g/dL — AB (ref 6.5–8.1)

## 2015-11-03 LAB — TROPONIN I
TROPONIN I: 2 ng/mL — AB (ref <0.031)
TROPONIN I: 1.44 ng/mL — AB (ref <0.031)
Troponin I: 1.68 ng/mL (ref <0.031)

## 2015-11-03 LAB — HEPARIN LEVEL (UNFRACTIONATED)
Heparin Unfractionated: <0.1 IU/mL — ABNORMAL LOW (ref 0.30–0.70)
Heparin Unfractionated: 0.15 IU/mL — ABNORMAL LOW (ref 0.30–0.70)

## 2015-11-03 LAB — PREALBUMIN: Prealbumin: 11.2 mg/dL — ABNORMAL LOW (ref 18–38)

## 2015-11-03 LAB — PROTIME-INR
INR: 1.18 (ref 0.00–1.49)
PROTHROMBIN TIME: 15.2 s (ref 11.6–15.2)

## 2015-11-03 LAB — PHOSPHORUS
Phosphorus: 2.1 mg/dL — ABNORMAL LOW (ref 2.5–4.6)
Phosphorus: 1.6 mg/dL — ABNORMAL LOW (ref 2.5–4.6)

## 2015-11-03 LAB — C DIFFICILE QUICK SCREEN W PCR REFLEX
C DIFFICILE (CDIFF) INTERP: NEGATIVE
C Diff antigen: NEGATIVE
C Diff toxin: NEGATIVE

## 2015-11-03 LAB — MRSA PCR SCREENING: MRSA by PCR: POSITIVE — AB

## 2015-11-03 LAB — APTT: aPTT: 28 seconds (ref 24–37)

## 2015-11-03 LAB — TSH: TSH: 0.371 u[IU]/mL (ref 0.350–4.500)

## 2015-11-03 LAB — MAGNESIUM: MAGNESIUM: 1.8 mg/dL (ref 1.7–2.4)

## 2015-11-03 MED ORDER — POTASSIUM CHLORIDE 10 MEQ/100ML IV SOLN
10.0000 meq | INTRAVENOUS | Status: AC
  Administered 2015-11-03 (×4): 10 meq via INTRAVENOUS
  Filled 2015-11-03 (×4): qty 100

## 2015-11-03 MED ORDER — BACLOFEN 10 MG PO TABS
10.0000 mg | ORAL_TABLET | Freq: Two times a day (BID) | ORAL | Status: DC
  Administered 2015-11-03 – 2015-11-08 (×11): 10 mg via ORAL
  Filled 2015-11-03 (×11): qty 1

## 2015-11-03 MED ORDER — CLONAZEPAM 0.5 MG PO TABS
0.5000 mg | ORAL_TABLET | Freq: Two times a day (BID) | ORAL | Status: DC | PRN
  Administered 2015-11-03 – 2015-11-05 (×2): 0.5 mg via ORAL
  Filled 2015-11-03 (×2): qty 1

## 2015-11-03 MED ORDER — ASPIRIN EC 325 MG PO TBEC
325.0000 mg | DELAYED_RELEASE_TABLET | Freq: Once | ORAL | Status: DC
  Filled 2015-11-03 (×2): qty 1

## 2015-11-03 MED ORDER — HEPARIN (PORCINE) IN NACL 100-0.45 UNIT/ML-% IJ SOLN
1150.0000 [IU]/h | INTRAMUSCULAR | Status: DC
  Administered 2015-11-03: 650 [IU]/h via INTRAVENOUS
  Administered 2015-11-03: 1050 [IU]/h via INTRAVENOUS
  Administered 2015-11-05: 1150 [IU]/h via INTRAVENOUS
  Filled 2015-11-03 (×4): qty 250

## 2015-11-03 MED ORDER — MUPIROCIN 2 % EX OINT
1.0000 "application " | TOPICAL_OINTMENT | Freq: Two times a day (BID) | CUTANEOUS | Status: AC
  Administered 2015-11-03 – 2015-11-07 (×10): 1 via NASAL
  Filled 2015-11-03 (×3): qty 22

## 2015-11-03 MED ORDER — FOLIC ACID 5 MG/ML IJ SOLN
1.0000 mg | Freq: Every day | INTRAMUSCULAR | Status: DC
  Administered 2015-11-03 – 2015-11-05 (×3): 1 mg via INTRAVENOUS
  Filled 2015-11-03 (×4): qty 0.2

## 2015-11-03 MED ORDER — THIAMINE HCL 100 MG/ML IJ SOLN
100.0000 mg | Freq: Every day | INTRAMUSCULAR | Status: DC
  Administered 2015-11-03 – 2015-11-05 (×3): 100 mg via INTRAVENOUS
  Filled 2015-11-03 (×3): qty 2

## 2015-11-03 MED ORDER — HEPARIN BOLUS VIA INFUSION
2000.0000 [IU] | Freq: Once | INTRAVENOUS | Status: AC
  Administered 2015-11-03: 2000 [IU] via INTRAVENOUS
  Filled 2015-11-03: qty 2000

## 2015-11-03 MED ORDER — GABAPENTIN 100 MG PO CAPS
100.0000 mg | ORAL_CAPSULE | Freq: Two times a day (BID) | ORAL | Status: DC
  Administered 2015-11-03 – 2015-11-08 (×11): 100 mg via ORAL
  Filled 2015-11-03 (×11): qty 1

## 2015-11-03 MED ORDER — POTASSIUM & SODIUM PHOSPHATES 280-160-250 MG PO PACK
1.0000 | PACK | Freq: Three times a day (TID) | ORAL | Status: AC
  Administered 2015-11-03 (×3): 1 via ORAL
  Filled 2015-11-03 (×3): qty 1

## 2015-11-03 MED ORDER — DULOXETINE HCL 20 MG PO CPEP
20.0000 mg | ORAL_CAPSULE | Freq: Every day | ORAL | Status: DC
  Filled 2015-11-03: qty 1

## 2015-11-03 MED ORDER — DULOXETINE HCL 30 MG PO CPEP
30.0000 mg | ORAL_CAPSULE | Freq: Every day | ORAL | Status: DC
  Administered 2015-11-04 – 2015-11-07 (×3): 30 mg via ORAL
  Filled 2015-11-03 (×5): qty 1

## 2015-11-03 MED ORDER — HEPARIN BOLUS VIA INFUSION
3000.0000 [IU] | Freq: Once | INTRAVENOUS | Status: DC
  Filled 2015-11-03: qty 3000

## 2015-11-03 MED ORDER — ONDANSETRON HCL 4 MG PO TABS: 4.0000 mg | ORAL_TABLET | Freq: Four times a day (QID) | ORAL | Status: DC | PRN

## 2015-11-03 MED ORDER — ASPIRIN 81 MG PO CHEW
81.0000 mg | CHEWABLE_TABLET | Freq: Every day | ORAL | Status: DC
  Administered 2015-11-03 – 2015-11-08 (×6): 81 mg via ORAL
  Filled 2015-11-03 (×6): qty 1

## 2015-11-03 MED ORDER — SODIUM CHLORIDE 0.9 % IV SOLN
INTRAVENOUS | Status: AC
  Administered 2015-11-03 (×2): via INTRAVENOUS

## 2015-11-03 MED ORDER — PANTOPRAZOLE SODIUM 40 MG PO TBEC
40.0000 mg | DELAYED_RELEASE_TABLET | Freq: Every day | ORAL | Status: DC
  Administered 2015-11-03 – 2015-11-08 (×6): 40 mg via ORAL
  Filled 2015-11-03 (×6): qty 1

## 2015-11-03 MED ORDER — ACETAMINOPHEN 325 MG PO TABS: 650.0000 mg | ORAL_TABLET | Freq: Four times a day (QID) | ORAL | Status: DC | PRN

## 2015-11-03 MED ORDER — HEPARIN BOLUS VIA INFUSION
1500.0000 [IU] | Freq: Once | INTRAVENOUS | Status: AC
  Administered 2015-11-03: 1500 [IU] via INTRAVENOUS
  Filled 2015-11-03: qty 1500

## 2015-11-03 MED ORDER — ONDANSETRON HCL 4 MG/2ML IJ SOLN
4.0000 mg | Freq: Four times a day (QID) | INTRAMUSCULAR | Status: DC | PRN
  Administered 2015-11-06: 4 mg via INTRAVENOUS
  Filled 2015-11-03: qty 2

## 2015-11-03 MED ORDER — ENSURE ENLIVE PO LIQD
237.0000 mL | Freq: Two times a day (BID) | ORAL | Status: DC
Start: 2015-11-03 — End: 2015-11-03
  Administered 2015-11-03: 237 mL via ORAL

## 2015-11-03 MED ORDER — POTASSIUM PHOSPHATES 15 MMOLE/5ML IV SOLN: 40.0000 meq | Freq: Once | INTRAVENOUS | Status: DC

## 2015-11-03 MED ORDER — HEPARIN BOLUS VIA INFUSION
3000.0000 [IU] | INTRAVENOUS | Status: AC
  Administered 2015-11-03: 3000 [IU] via INTRAVENOUS
  Filled 2015-11-03: qty 3000

## 2015-11-03 MED ORDER — CARVEDILOL 3.125 MG PO TABS
3.1250 mg | ORAL_TABLET | Freq: Once | ORAL | Status: AC
  Administered 2015-11-03: 3.125 mg via ORAL
  Filled 2015-11-03: qty 1

## 2015-11-03 MED ORDER — ASPIRIN 325 MG PO TABS
325.0000 mg | ORAL_TABLET | ORAL | Status: AC
  Administered 2015-11-03: 325 mg via ORAL
  Filled 2015-11-03: qty 1

## 2015-11-03 MED ORDER — NITROGLYCERIN 0.4 MG SL SUBL
SUBLINGUAL_TABLET | SUBLINGUAL | Status: AC
  Administered 2015-11-03: 0.4 mg
  Filled 2015-11-03: qty 1

## 2015-11-03 MED ORDER — PRO-STAT SUGAR FREE PO LIQD
30.0000 mL | Freq: Two times a day (BID) | ORAL | Status: DC
  Administered 2015-11-03: 30 mL via ORAL
  Filled 2015-11-03: qty 30

## 2015-11-03 MED ORDER — CHLORHEXIDINE GLUCONATE CLOTH 2 % EX PADS
6.0000 | MEDICATED_PAD | Freq: Every day | CUTANEOUS | Status: AC
  Administered 2015-11-03 – 2015-11-07 (×3): 6 via TOPICAL

## 2015-11-03 MED ORDER — ENSURE ENLIVE PO LIQD
237.0000 mL | Freq: Two times a day (BID) | ORAL | Status: DC
  Administered 2015-11-03 – 2015-11-08 (×5): 237 mL via ORAL

## 2015-11-03 MED ORDER — SODIUM CHLORIDE 0.9% FLUSH
3.0000 mL | Freq: Two times a day (BID) | INTRAVENOUS | Status: DC
  Administered 2015-11-03 – 2015-11-07 (×6): 3 mL via INTRAVENOUS

## 2015-11-03 MED ORDER — CETYLPYRIDINIUM CHLORIDE 0.05 % MT LIQD
7.0000 mL | Freq: Two times a day (BID) | OROMUCOSAL | Status: DC
  Administered 2015-11-03 – 2015-11-08 (×7): 7 mL via OROMUCOSAL

## 2015-11-03 MED ORDER — PRO-STAT SUGAR FREE PO LIQD
30.0000 mL | Freq: Three times a day (TID) | ORAL | Status: DC
  Administered 2015-11-04 – 2015-11-05 (×3): 30 mL via ORAL
  Filled 2015-11-03 (×6): qty 30

## 2015-11-03 MED ORDER — OLANZAPINE 2.5 MG PO TABS
2.5000 mg | ORAL_TABLET | Freq: Every day | ORAL | Status: DC
  Administered 2015-11-06: 2.5 mg via ORAL
  Filled 2015-11-03 (×7): qty 1

## 2015-11-03 MED ORDER — CARVEDILOL 3.125 MG PO TABS
3.1250 mg | ORAL_TABLET | Freq: Two times a day (BID) | ORAL | Status: DC
  Administered 2015-11-03 – 2015-11-05 (×5): 3.125 mg via ORAL
  Filled 2015-11-03 (×5): qty 1

## 2015-11-03 MED ORDER — SODIUM CHLORIDE 0.9 % IV SOLN
INTRAVENOUS | Status: AC
  Administered 2015-11-03: 08:00:00 via INTRAVENOUS

## 2015-11-03 MED ORDER — ADULT MULTIVITAMIN W/MINERALS CH
1.0000 | ORAL_TABLET | Freq: Every day | ORAL | Status: DC
  Administered 2015-11-03 – 2015-11-08 (×6): 1 via ORAL
  Filled 2015-11-03 (×6): qty 1

## 2015-11-03 MED ORDER — NITROGLYCERIN IN D5W 200-5 MCG/ML-% IV SOLN
0.0000 ug/min | INTRAVENOUS | Status: DC
  Administered 2015-11-03: 5 ug/min via INTRAVENOUS
  Filled 2015-11-03 (×2): qty 250

## 2015-11-03 MED ORDER — ACETAMINOPHEN 650 MG RE SUPP: 650.0000 mg | Freq: Four times a day (QID) | RECTAL | Status: DC | PRN
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
## 2015-11-03 NOTE — ED Notes (Signed)
Pt now c/o chest pain, dr. Roel Cluck notified of the same

## 2015-11-03 NOTE — Progress Notes (Signed)
Pt transferred to 3s01 from [facility] ED by [facility].  Pt is an IVC, sitter at bedside.  Pt is A&Ox4 call bell within reach

## 2015-11-03 NOTE — Progress Notes (Signed)
[facility] hospitalist progress note. Chief complaint. Transfer note. History of present illness. This 45 year old male presented with abdominal pain and noted elevated troponin. Patient was felt to require other workup at [facility] and the patient has been transferred for that purpose. He is arrived and I'm seeing him at bedside to ensure she remains clinically stable and his orders have transferred here appropriately. Patient has no current medical complaints. Physical exam. Vital signs. Temperature 97.8, pulse 80, respiration 14, blood pressure 164/118. O2 sats 100%. General appearance. Thin frail middle-aged male who is alert and in no distress. Cardiac. Rate and rhythm regular. Lungs. Breath sounds clear. Abdomen. Soft with positive bowel sounds. Impression/plan. Problem #1. Hypokalemia. Replace and follow. Problem #2. Elevated troponin. Continue to cycle troponins, echocardiogram, cardiac consult, and heparin drip. Problem #3. Constipation. Follow-up KUB Problem before. Neurogenic bladder. Chronic Foley catheter. Problem #4. Protein calorie malnutrition. Nutritional consult. The patient appears clinically stable post transfer. All orders appear to of transferred here appropriately.

## 2015-11-03 NOTE — Progress Notes (Signed)
ANTICOAGULATION CONSULT NOTE - Follow Up Consult  Pharmacy Consult for Heparin Indication: chest pain/ACS  No Known Allergies  Patient Measurements: Height: 5\' 10"  (177.8 cm) Weight: 115 lb (52.164 kg) IBW/kg (Calculated) : 73 Heparin Dosing Weight: ABW  Vital Signs: Temp: 98 F (36.7 C) (04/29 1500) Temp Source: Oral (04/29 1500) BP: 105/66 mmHg (04/29 1227) Pulse Rate: 83 (04/29 1227)  Labs:  Recent Labs  11/02/15 2012 11/02/15 2228 11/02/15 2258 11/03/15 0118 11/03/15 0427 11/03/15 0428 11/03/15 0744 11/03/15 1555  HGB 11.9*  --   --   --   --  11.7*  --   --   HCT 36.4*  --   --   --   --  34.7*  --   --   PLT 357  --   --   --   --  340  --   --   APTT  --   --   --  28  --   --   --   --   LABPROT  --   --   --  15.2  --   --   --   --   INR  --   --   --  1.18  --   --   --   --   HEPARINUNFRC  --   --   --   --   --   --  <0.10* 0.15*  CREATININE 0.85  --  0.87  --   --  0.84  --   --   TROPONINI  --  2.91*  --   --  2.00*  --   --   --     Estimated Creatinine Clearance: 82.9 mL/min (by C-G formula based on Cr of 0.84).   Medications:  Infusions:  . heparin 850 Units/hr (11/03/15 0914)    Assessment: 45 year old paraplegic male on IV heparin for ACS.   Initial heparin level is sub-therapeutic on 650 units/hr.  H/H low-stable. Platelets stable at 340. No bleeding documented. Baseline INR wnl.   Repeat HL remains subtherapeutic at 0.15 on heparin 850 units/hr. Nurse reports no issues with infusion or bleeding.  Goal of Therapy:  Heparin level 0.3-0.7 units/ml Monitor platelets by anticoagulation protocol: Yes   Plan:  Re-bolus heparin 2000 units x1 then increase heparin rate to 1050 units/hr. 6h HL Daily heparin level and CBC while on therapy.   Andrey Cota. Diona Foley, PharmD, BCPS Clinical Pharmacist Pager [phone]  11/03/2015,4:37 PM

## 2015-11-03 NOTE — Progress Notes (Addendum)
Initial Nutrition Assessment  DOCUMENTATION CODES:   Underweight, Severe malnutrition in context of chronic illness  INTERVENTION:   -Continue Ensure Enlive po BID, each supplement provides 350 kcal and 20 grams of protein -Increase 30 ml Prostat to TID, each supplement provides 100 kcals and 15 grams protein -MVI daily  NUTRITION DIAGNOSIS:   Malnutrition related to chronic illness as evidenced by severe depletion of body fat, severe depletion of muscle mass, percent weight loss.  GOAL:   Patient will meet greater than or equal to 90% of their needs  MONITOR:   PO intake, Supplement acceptance, Labs, Skin, I & O's  REASON FOR ASSESSMENT:   Consult, Malnutrition Screening Tool Assessment of nutrition requirement/status  ASSESSMENT:   This 45 year old male presented with abdominal pain and noted elevated troponin. Patient was felt to require other workup at [facility] and the patient has been transferred for that purpose. He is arrived and I'm seeing him at bedside to ensure she remains clinically stable and his orders have transferred here appropriately. Patient has no current medical complaints.  Pt admitted with abdominal pain and elevated troponin.   Spoke with pt at bedside. He reports that his appetite remains poor. He reports he was not eating well PTA; when RD probed further pt reports "it was the food- it just wasn't right". Pt confirms he is taking the Prostat supplements. He has a cup of Ensure at bedside, but has only consumed a few sips. Pt reports he will continue to work on drinking it. He refused offer of other supplements and report he did not use any supplements PTA.   Pt endorses weight loss, however, is unable to provide quantity or time frame for the weight loss ("they said I lost 50 pounds"). Per documented wt hx, pt has experienced a 10.2% wt loss over the past 6 months, which is significant for time frame.   Nutrition-Focused physical exam  completed. Findings are moderate to severe fat depletion, moderate to severe muscle depletion, and mild edema.   Case discussed with RN. She reports that pt consumed about 50% of his breakfast meal today. She reveals pt took Prostat well and pt was working on his Ensure; she reports he told her he couldn't drink the whole container, but was encouraged to consume as much as he could. Pt does have hx of going through periods of refusing food, suspecting it is poisoned. Per RN, pt questions everything that is provided to him, but will currently accept if rationale for taking food/medication is provided to him.   Palliative care consulted for goals of care. Per chart review, pt has refused PEG.  Labs reviewed: Na: 146, K: 2.6 (on supplementation), Phos: 1.6.  Diet Order:  Diet Heart Room service appropriate?: Yes; Fluid consistency:: Thin  Skin:  Wound (see comment) (DTPI rt heel, st II coccyx, st II rt/lt buttocks)  Last BM:  11/02/15  Height:   Ht Readings from Last 1 Encounters:  11/03/15 5\' 10"  (1.778 m)    Weight:   Wt Readings from Last 1 Encounters:  11/03/15 115 lb (52.164 kg)    Ideal Body Weight:  70.2 kg  BMI:  Body mass index is 16.5 kg/(m^2).  Estimated Nutritional Needs:   Kcal:  1600-1800  Protein:  90-105 grams  Fluid:  1.6-1.8 L  EDUCATION NEEDS:   Education needs addressed  Micala Saltsman A. Jimmye Norman, RD, LDN, CDE Pager: [phone] After hours Pager: [phone]

## 2015-11-03 NOTE — Progress Notes (Signed)
ANTICOAGULATION CONSULT NOTE - Follow Up Consult  Pharmacy Consult for Heparin Indication: chest pain/ACS  No Known Allergies  Patient Measurements: Height: 5\' 10"  (177.8 cm) Weight: 115 lb (52.164 kg) IBW/kg (Calculated) : 73 Heparin Dosing Weight: ABW  Vital Signs: Temp: 97.7 F (36.5 C) (04/29 0749) Temp Source: Oral (04/29 0749) BP: 118/78 mmHg (04/29 0752) Pulse Rate: 78 (04/29 0752)  Labs:  Recent Labs  11/02/15 2012 11/02/15 2228 11/02/15 2258 11/03/15 0118 11/03/15 0427 11/03/15 0428 11/03/15 0744  HGB 11.9*  --   --   --   --  11.7*  --   HCT 36.4*  --   --   --   --  34.7*  --   PLT 357  --   --   --   --  340  --   APTT  --   --   --  28  --   --   --   LABPROT  --   --   --  15.2  --   --   --   INR  --   --   --  1.18  --   --   --   HEPARINUNFRC  --   --   --   --   --   --  <0.10*  CREATININE 0.85  --  0.87  --   --  0.84  --   TROPONINI  --  2.91*  --   --  2.00*  --   --     Estimated Creatinine Clearance: 82.9 mL/min (by C-G formula based on Cr of 0.84).   Medications:  Infusions:  . sodium chloride 75 mL/hr at 11/03/15 0751  . heparin 650 Units/hr (11/03/15 0700)    Assessment: 45 year old paraplegic male on IV heparin for ACS.   Initial heparin level is sub-therapeutic on 650 units/hr.  H/H low-stable. Platelets stable at 340. No bleeding documented. Baseline INR wnl.   Goal of Therapy:  Heparin level 0.3-0.7 units/ml Monitor platelets by anticoagulation protocol: Yes   Plan:  Re-bolus heparin 1500 units x1 then increase heparin rate to 850 units/hr. Recheck heparin level in 6 hours.  Daily heparin level and CBC while on therapy.   Sloan Leiter, PharmD, BCPS Clinical Pharmacist [phone] 11/03/2015,9:09 AM

## 2015-11-03 NOTE — ED Notes (Signed)
Spoke with dr. Roel Cluck additionally about this patient, she will be calling cardiology at this time, and will confirm pt bed placement after speaking with them

## 2015-11-03 NOTE — Progress Notes (Signed)
Patient educated regarding nitroglycerin gtt. Patient refusing medication to be administered. States he does not want "the entire bottle." Patient educated on the concept of a gtt and how it works, how the medication will assist with the chest tightness/pain, and what we are going to be monitoring to ensure . Patient asked nurse to tell him the truth, that he's "not going to make it" and he's "going to die." Patient told that he is not dying and what his condition means. Pt needs ongoing education. Will cont to monitor.
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
## 2015-11-03 NOTE — ED Notes (Signed)
[facility] notified of need for transport, notified them that the pt is currently under IVC

## 2015-11-03 NOTE — Progress Notes (Signed)
CRITICAL VALUE ALERT  Critical value received: MRSA +  Date of notification:  11/03/2015  Time of notification:  [id number]  Critical value read back:Yes.    Nurse who received alert:  Lailany Enoch   MD notified (1st page):  Dr. Seward Meth  Time of first page:  1008  Responding MD:  Dr. Seward Meth  Time MD responded:  1009

## 2015-11-03 NOTE — Progress Notes (Signed)
Patient c/o chest tightness, more than before. EKG done. Cardiology paged. New orders received.
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
## 2015-11-04 ENCOUNTER — Inpatient Hospital Stay (HOSPITAL_COMMUNITY): Payer: Medicaid Other

## 2015-11-04 LAB — COMPREHENSIVE METABOLIC PANEL
ALT: 11 U/L — AB (ref 17–63)
AST: 18 U/L (ref 15–41)
Albumin: 2.4 g/dL — ABNORMAL LOW (ref 3.5–5.0)
Alkaline Phosphatase: 53 U/L (ref 38–126)
Anion gap: 10 (ref 5–15)
BILIRUBIN TOTAL: 0.6 mg/dL (ref 0.3–1.2)
BUN: 5 mg/dL — AB (ref 6–20)
CO2: 32 mmol/L (ref 22–32)
Calcium: 9.1 mg/dL (ref 8.9–10.3)
Chloride: 102 mmol/L (ref 101–111)
Creatinine, Ser: 0.66 mg/dL (ref 0.61–1.24)
GFR calc Af Amer: >60 mL/min (ref >60)
Glucose, Bld: 104 mg/dL — ABNORMAL HIGH (ref 65–99)
Potassium: 2.1 mmol/L — CL (ref 3.5–5.1)
Sodium: 144 mmol/L (ref 135–145)
TOTAL PROTEIN: 7 g/dL (ref 6.5–8.1)

## 2015-11-04 LAB — TROPONIN I: TROPONIN I: 0.73 ng/mL — AB (ref <0.031)

## 2015-11-04 LAB — MAGNESIUM: Magnesium: 1.8 mg/dL (ref 1.7–2.4)

## 2015-11-04 LAB — CBC
HEMATOCRIT: 29.8 % — AB (ref 39.0–52.0)
Hemoglobin: 9.6 g/dL — ABNORMAL LOW (ref 13.0–17.0)
MCH: 27.4 pg (ref 26.0–34.0)
MCHC: 32.2 g/dL (ref 30.0–36.0)
MCV: 84.9 fL (ref 78.0–100.0)
Platelets: 274 10*3/uL (ref 150–400)
RBC: 3.51 MIL/uL — AB (ref 4.22–5.81)
RDW: 14.6 % (ref 11.5–15.5)
WBC: 7.4 10*3/uL (ref 4.0–10.5)

## 2015-11-04 LAB — PHOSPHORUS: PHOSPHORUS: 2.3 mg/dL — AB (ref 2.5–4.6)

## 2015-11-04 LAB — HEPARIN LEVEL (UNFRACTIONATED)
Heparin Unfractionated: 0.27 IU/mL — ABNORMAL LOW (ref 0.30–0.70)
Heparin Unfractionated: 0.4 IU/mL (ref 0.30–0.70)

## 2015-11-04 MED ORDER — POTASSIUM CHLORIDE CRYS ER 20 MEQ PO TBCR
40.0000 meq | EXTENDED_RELEASE_TABLET | Freq: Three times a day (TID) | ORAL | Status: AC
  Administered 2015-11-04 (×3): 40 meq via ORAL
  Filled 2015-11-04 (×3): qty 2

## 2015-11-04 MED ORDER — POTASSIUM CHLORIDE 10 MEQ/100ML IV SOLN
10.0000 meq | INTRAVENOUS | Status: AC
  Administered 2015-11-04 (×6): 10 meq via INTRAVENOUS
  Filled 2015-11-04 (×6): qty 100

## 2015-11-04 MED ORDER — IOPAMIDOL (ISOVUE-300) INJECTION 61%
INTRAVENOUS | Status: AC
  Administered 2015-11-04: 23:00:00
  Administered 2015-11-04: 80 mL
  Filled 2015-11-04: qty 100

## 2015-11-04 MED ORDER — POTASSIUM CHLORIDE IN NACL 40-0.9 MEQ/L-% IV SOLN
INTRAVENOUS | Status: DC
  Administered 2015-11-04 – 2015-11-05 (×2): 75 mL/h via INTRAVENOUS
  Filled 2015-11-04 (×4): qty 1000

## 2015-11-04 MED ORDER — POTASSIUM & SODIUM PHOSPHATES 280-160-250 MG PO PACK
1.0000 | PACK | Freq: Three times a day (TID) | ORAL | Status: AC
  Administered 2015-11-04 (×3): 1 via ORAL
  Filled 2015-11-04 (×3): qty 1

## 2015-11-04 MED ORDER — BARIUM SULFATE 2.1 % PO SUSP
ORAL | Status: AC
  Filled 2015-11-04: qty 2
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
## 2015-11-04 NOTE — Progress Notes (Signed)
ANTICOAGULATION CONSULT NOTE - Follow Up Consult  Pharmacy Consult for Heparin Indication: chest pain/ACS  No Known Allergies  Patient Measurements: Height: 5\' 10"  (177.8 cm) Weight: 115 lb (52.164 kg) IBW/kg (Calculated) : 73  Vital Signs: Temp: 98.6 F (37 C) (04/30 0301) Temp Source: Oral (04/30 0301) BP: 129/89 mmHg (04/30 0301) Pulse Rate: 88 (04/30 0301)  Labs:  Recent Labs  11/02/15 2012  11/02/15 2258 11/03/15 0118 11/03/15 0427 11/03/15 0428 11/03/15 0744 11/03/15 1555 11/03/15 2034 11/04/15 0448  HGB 11.9*  --   --   --   --  11.7*  --   --   --  9.6*  HCT 36.4*  --   --   --   --  34.7*  --   --   --  29.8*  PLT 357  --   --   --   --  340  --   --   --  274  APTT  --   --   --  28  --   --   --   --   --   --   LABPROT  --   --   --  15.2  --   --   --   --   --   --   INR  --   --   --  1.18  --   --   --   --   --   --   HEPARINUNFRC  --   --   --   --   --   --  <0.10* 0.15*  --  0.27*  CREATININE 0.85  --  0.87  --   --  0.84  --   --   --   --   TROPONINI  --   < >  --   --  2.00*  --   --  1.44* 1.68*  --   < > = values in this interval not displayed.  Estimated Creatinine Clearance: 82.9 mL/min (by C-G formula based on Cr of 0.84).    Assessment: Heparin for elevated troponin, HL remains sub-therapeutic, no issues per RN.   Goal of Therapy:  Heparin level 0.3-0.7 units/ml Monitor platelets by anticoagulation protocol: Yes   Plan:  -Inc heparin to 1150 units/hr -1300 HL  Nyquan Selbe 11/04/2015,5:52 AM

## 2015-11-04 NOTE — Progress Notes (Addendum)
ANTICOAGULATION CONSULT NOTE - Follow Up Consult  Pharmacy Consult for Heparin Indication: chest pain/ACS  No Known Allergies  Patient Measurements: Height: 5\' 10"  (177.8 cm) Weight: 115 lb (52.164 kg) IBW/kg (Calculated) : 73  Vital Signs: Temp: 98.3 F (36.8 C) (04/30 1118) Temp Source: Oral (04/30 1118) BP: 153/102 mmHg (04/30 1200) Pulse Rate: 79 (04/30 1200)  Labs:  Recent Labs  11/02/15 2012  11/02/15 2258 11/03/15 0118  11/03/15 0428  11/03/15 1555 11/03/15 2034 11/04/15 0448 11/04/15 1410  HGB 11.9*  --   --   --   --  11.7*  --   --   --  9.6*  --   HCT 36.4*  --   --   --   --  34.7*  --   --   --  29.8*  --   PLT 357  --   --   --   --  340  --   --   --  274  --   APTT  --   --   --  28  --   --   --   --   --   --   --   LABPROT  --   --   --  15.2  --   --   --   --   --   --   --   INR  --   --   --  1.18  --   --   --   --   --   --   --   HEPARINUNFRC  --   --   --   --   --   --   < > 0.15*  --  0.27* 0.40  CREATININE 0.85  --  0.87  --   --  0.84  --   --   --  0.66  --   TROPONINI  --   < >  --   --   < >  --   --  1.44* 1.68* 0.73*  --   < > = values in this interval not displayed.  Estimated Creatinine Clearance: 87 mL/min (by C-G formula based on Cr of 0.66).    Assessment: 44 YOM on IV heparin for elevated troponin.  Heparin level is therapeutic after increase to 1150 units/hr. Drop noted in Hgb from 11.7 to 9.6, platelets stable. No overt bleeding reported.    Goal of Therapy:  Heparin level 0.3-0.7 units/ml Monitor platelets by anticoagulation protocol: Yes   Plan:  Continue heparin at 1150 units/hr  Follow-up am labs Monitor for signs and symptoms of bleeding   Sloan Leiter, PharmD, BCPS Clinical Pharmacist [phone]  11/04/2015,3:04 PM

## 2015-11-04 NOTE — Progress Notes (Signed)
Potassium 2.1 on am labs. New orders received for 67meq of oral K, given as ordered.

## 2015-11-05 ENCOUNTER — Ambulatory Visit (HOSPITAL_COMMUNITY): Payer: Medicaid Other

## 2015-11-05 DIAGNOSIS — R509 Fever, unspecified: Secondary | ICD-10-CM

## 2015-11-05 DIAGNOSIS — F333 Major depressive disorder, recurrent, severe with psychotic symptoms: Secondary | ICD-10-CM

## 2015-11-05 LAB — CBC
HCT: 29.2 % — ABNORMAL LOW (ref 39.0–52.0)
HEMOGLOBIN: 9.4 g/dL — AB (ref 13.0–17.0)
MCH: 28.1 pg (ref 26.0–34.0)
MCHC: 32.2 g/dL (ref 30.0–36.0)
MCV: 87.2 fL (ref 78.0–100.0)
Platelets: 295 10*3/uL (ref 150–400)
RBC: 3.35 MIL/uL — AB (ref 4.22–5.81)
RDW: 14.9 % (ref 11.5–15.5)
WBC: 8.2 10*3/uL (ref 4.0–10.5)

## 2015-11-05 LAB — COMPREHENSIVE METABOLIC PANEL
ALBUMIN: 2.3 g/dL — AB (ref 3.5–5.0)
ALK PHOS: 54 U/L (ref 38–126)
ALT: 11 U/L — AB (ref 17–63)
AST: 18 U/L (ref 15–41)
Anion gap: 10 (ref 5–15)
BILIRUBIN TOTAL: 0.3 mg/dL (ref 0.3–1.2)
BUN: 7 mg/dL (ref 6–20)
CALCIUM: 8.9 mg/dL (ref 8.9–10.3)
CO2: 29 mmol/L (ref 22–32)
CREATININE: 0.68 mg/dL (ref 0.61–1.24)
Chloride: 107 mmol/L (ref 101–111)
GFR calc Af Amer: >60 mL/min (ref >60)
GLUCOSE: 133 mg/dL — AB (ref 65–99)
Potassium: 4.7 mmol/L (ref 3.5–5.1)
Sodium: 146 mmol/L — ABNORMAL HIGH (ref 135–145)
Total Protein: 6.9 g/dL (ref 6.5–8.1)

## 2015-11-05 LAB — ECHOCARDIOGRAM COMPLETE
HEIGHTINCHES: 70 in
WEIGHTICAEL: 1837.75 [oz_av]

## 2015-11-05 LAB — PHOSPHORUS: Phosphorus: 2 mg/dL — ABNORMAL LOW (ref 2.5–4.6)

## 2015-11-05 LAB — HEPARIN LEVEL (UNFRACTIONATED): Heparin Unfractionated: 0.4 IU/mL (ref 0.30–0.70)

## 2015-11-05 MED ORDER — HYDRALAZINE HCL 20 MG/ML IJ SOLN: 5.0000 mg | Freq: Four times a day (QID) | INTRAMUSCULAR | Status: DC | PRN

## 2015-11-05 MED ORDER — VITAMIN B-1 100 MG PO TABS
100.0000 mg | ORAL_TABLET | Freq: Every day | ORAL | Status: DC
  Administered 2015-11-06 – 2015-11-08 (×3): 100 mg via ORAL
  Filled 2015-11-05 (×3): qty 1

## 2015-11-05 MED ORDER — SODIUM CHLORIDE 0.9 % IV SOLN
INTRAVENOUS | Status: DC
  Administered 2015-11-05 – 2015-11-08 (×5): via INTRAVENOUS

## 2015-11-05 MED ORDER — FOLIC ACID 1 MG PO TABS
1.0000 mg | ORAL_TABLET | Freq: Every day | ORAL | Status: DC
Start: 2015-11-06 — End: 2015-11-08
  Administered 2015-11-06 – 2015-11-08 (×3): 1 mg via ORAL
  Filled 2015-11-05 (×3): qty 1

## 2015-11-05 MED ORDER — CARVEDILOL 6.25 MG PO TABS
6.2500 mg | ORAL_TABLET | Freq: Two times a day (BID) | ORAL | Status: DC
  Administered 2015-11-06: 6.25 mg via ORAL
  Filled 2015-11-05: qty 1

## 2015-11-05 MED ORDER — POLYETHYLENE GLYCOL 3350 17 G PO PACK
17.0000 g | PACK | Freq: Three times a day (TID) | ORAL | Status: AC
  Filled 2015-11-05 (×3): qty 1

## 2015-11-05 MED ORDER — SODIUM PHOSPHATES 45 MMOLE/15ML IV SOLN
30.0000 mmol | Freq: Once | INTRAVENOUS | Status: AC
  Administered 2015-11-05: 30 mmol via INTRAVENOUS
  Filled 2015-11-05: qty 10

## 2015-11-05 NOTE — Progress Notes (Signed)
Transferred in from stepdown.Alert and oriented x 4.Multiple skin issues noted as per assessed with the charge nurse.Unstegeable pressure ulcer on each heal dressed with mapilex and 3 stage ii pressure ulcers on patient buttocks and sacral areas.Measurement as per previously documented by the [place].

## 2015-11-05 NOTE — Consult Note (Addendum)
WOC wound consult note Reason for Consult: Consult requested for several wounds.  Pt is very emaciated and is frequently incontinent of loose stools and has moisture associated skin damage with multiple patchy areas of partial thickness skin breakdown to buttocks and sacrum area.  Some areas have evolved into stage 2 pressure injuries. It is difficult to keep dressings to these areas from becoming soiled. Wound type: Left anterior foot with partial thickness abrasion, 1X.2X.1cm, pink and dry, no odor or drainage.   Left plantar foot with previous full thickness wound which had osteomyelitis earlier this year according to EMR; site is now dry brown closed scar. No open wound, odor, drainage, or fluctuance when probed. Right heel with darker colored deep tissue injury; 3X3cm Left buttock with pink moist stage 2 pressure injury; 4X3X.1cm Right buttock with pink moist stage 2 pressure injury; 2X3X.1cm Sacrum with pink moist stage 2 pressure injury; 4X2X.1cm Pressure Ulcer POA: Yes Dressing procedure/placement/frequency: Dietary consult to optimize protein intake, air mattress to decrease pressure and optimize airflow to affected areas on buttocks/sacrum.  Float heels to reduce pressure to this location, foam dressings to protect from further injury to sacrum and bilat buttocks and left anterior foot.  Discussed plan of care with patient and he verbalized understanding. Please re-consult if further assistance is needed.  Thank-you,  Julien Girt MSN, [place], [place], [place], [place]

## 2015-11-05 NOTE — Progress Notes (Signed)
  Echocardiogram 2D Echocardiogram has been performed.  Jennette Dubin 11/05/2015, 11:34 AM
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
## 2015-11-05 NOTE — Progress Notes (Signed)
   Echo looks okay. Probably best to provide conservative medical management given complex medical and psychiatric problems.
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
## 2015-11-05 NOTE — Progress Notes (Signed)
ANTICOAGULATION CONSULT NOTE - Follow Up Consult  Pharmacy Consult:  Heparin Indication: chest pain/ACS  No Known Allergies  Patient Measurements: Height: 5\' 10"  (177.8 cm) Weight: 114 lb 13.8 oz (52.1 kg) IBW/kg (Calculated) : 73  Vital Signs: Temp: 97.7 F (36.5 C) (05/01 0401) Temp Source: Oral (05/01 0000) BP: 111/71 mmHg (05/01 0730) Pulse Rate: 92 (05/01 0730)  Labs:  Recent Labs  11/03/15 0118  11/03/15 0428  11/03/15 1555 11/03/15 2034 11/04/15 0448 11/04/15 1410 11/05/15 0357  HGB  --   --  11.7*  --   --   --  9.6*  --  9.4*  HCT  --   --  34.7*  --   --   --  29.8*  --  29.2*  PLT  --   --  340  --   --   --  274  --  295  APTT 28  --   --   --   --   --   --   --   --   LABPROT 15.2  --   --   --   --   --   --   --   --   INR 1.18  --   --   --   --   --   --   --   --   HEPARINUNFRC  --   --   --   < > 0.15*  --  0.27* 0.40 0.40  CREATININE  --   --  0.84  --   --   --  0.66  --  0.68  TROPONINI  --   < >  --   --  1.44* 1.68* 0.73*  --   --   < > = values in this interval not displayed.  Estimated Creatinine Clearance: 86.8 mL/min (by C-G formula based on Cr of 0.68).    Assessment: 44 YOM on IV heparin for ACS.  Heparin level is therapeutic; no bleeding reported.   Goal of Therapy:  Heparin level 0.3-0.7 units/ml Monitor platelets by anticoagulation protocol: Yes    Plan:  - Continue heparin gtt at 1150 units/hr - Daily HL / CBC - F/U stop Cymbalta? Records state patient was not taking since 4/21 - Consider changing IVF to 1/2NS or D5W d/t hypernatremia - F/U ECHO   Niki Cosman D. Mina Marble, PharmD, BCPS Pager:  [phone] 11/05/2015, 11:58 AM
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
## 2015-11-05 NOTE — Progress Notes (Signed)
Pt continues to complain of back pain and abdominal pain but continues to deny pain medications when offered. Will continue to monitor

## 2015-11-05 NOTE — Clinical Documentation Improvement (Signed)
Internal Medicine  Can the diagnosis of pressure ulcer be further specified by stage and site, POA  ? Thank you    Document if pressure ulcer with stage is Present on Admission   Document Site with laterality - Elbow, Back (upper/lower), Sacral, Hip, Buttock, Ankle, Heel, Head, Other (Specify)  Pressure Ulcer Stage - Stage1, Stage 2, Stage 3, Stage 4, Unstageable, Unspecified, Unable to Clinically Determine  Other  Clinically Undetermined    Supporting Information: ED Nursing note ......"has pressure sores to the buttocks area"  Please exercise your independent, professional judgment when responding. A specific answer is not anticipated or expected.   Thank You,  [place] [phone]

## 2015-11-05 NOTE — Progress Notes (Signed)
Palliative Medicine RN Note: Per PMT NP Hulan Amato discussion with Dr Waldron Labs this weekend, PMT will sign off. Please re-consult if pt has any palliative-related concerns with which we can help.  Larina Earthly, RN, BSN, [facility] 11/05/2015 9:37 AM Cell [phone] 8:00-4:00 Monday-Friday Office [phone]
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
## 2015-11-05 NOTE — Progress Notes (Signed)
Paged Dr. Waldron Labs regarding patient blood pressure trending back up. Awaiting reply, will continue to monitor>

## 2015-11-06 DIAGNOSIS — E876 Hypokalemia: Principal | ICD-10-CM

## 2015-11-06 LAB — BASIC METABOLIC PANEL
ANION GAP: 10 (ref 5–15)
BUN: 6 mg/dL (ref 6–20)
CO2: 28 mmol/L (ref 22–32)
Calcium: 8.8 mg/dL — ABNORMAL LOW (ref 8.9–10.3)
Chloride: 103 mmol/L (ref 101–111)
Creatinine, Ser: 0.68 mg/dL (ref 0.61–1.24)
GFR calc Af Amer: >60 mL/min (ref >60)
Glucose, Bld: 91 mg/dL (ref 65–99)
POTASSIUM: 3.3 mmol/L — AB (ref 3.5–5.1)
SODIUM: 141 mmol/L (ref 135–145)

## 2015-11-06 LAB — CBC
HCT: 28.9 % — ABNORMAL LOW (ref 39.0–52.0)
HEMOGLOBIN: 9.1 g/dL — AB (ref 13.0–17.0)
MCH: 26.9 pg (ref 26.0–34.0)
MCHC: 31.5 g/dL (ref 30.0–36.0)
MCV: 85.5 fL (ref 78.0–100.0)
Platelets: 276 10*3/uL (ref 150–400)
RBC: 3.38 MIL/uL — AB (ref 4.22–5.81)
RDW: 15.1 % (ref 11.5–15.5)
WBC: 7.7 10*3/uL (ref 4.0–10.5)

## 2015-11-06 LAB — HEPARIN LEVEL (UNFRACTIONATED)

## 2015-11-06 MED ORDER — POTASSIUM CHLORIDE CRYS ER 20 MEQ PO TBCR
40.0000 meq | EXTENDED_RELEASE_TABLET | ORAL | Status: AC
  Administered 2015-11-06 (×2): 40 meq via ORAL
  Filled 2015-11-06 (×2): qty 2

## 2015-11-06 MED ORDER — HEPARIN SODIUM (PORCINE) 5000 UNIT/ML IJ SOLN
5000.0000 [IU] | Freq: Three times a day (TID) | INTRAMUSCULAR | Status: DC
  Administered 2015-11-06 – 2015-11-08 (×7): 5000 [IU] via SUBCUTANEOUS
  Filled 2015-11-06 (×7): qty 1

## 2015-11-06 MED ORDER — CARVEDILOL 12.5 MG PO TABS
12.5000 mg | ORAL_TABLET | Freq: Two times a day (BID) | ORAL | Status: DC
  Administered 2015-11-06 – 2015-11-08 (×3): 12.5 mg via ORAL
  Filled 2015-11-06 (×3): qty 1

## 2015-11-06 MED ORDER — NITROGLYCERIN 0.4 MG SL SUBL
0.4000 mg | SUBLINGUAL_TABLET | SUBLINGUAL | Status: DC | PRN
  Filled 2015-11-06: qty 1
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
## 2015-11-06 NOTE — Progress Notes (Addendum)
   Spoke to Dr. Waldron Labs who requests an opinion about whether the patient has coronary disease or not.  After reviewing the data, a conservative diagnosis is that the patient has coronary disease. He has had elevated troponin, transient T-wave abnormality and ST abnormality on EKG (possibly related ischemia vs hypokalemia), and recurring chest pressure that has anginal/ischemic qualities.  Markers are elevated in the setting of severe malnutrition.  Echo demonstrates normal left ventricular size and function.  I would recommend up titration of beta blocker therapy, nitroglycerin sublingually for recurrent episodes of pain, and aspirin therapy. When psychiatric status is better compensated, we should have a conversation concerning alternative treatment options for coronary disease including cath, CABG, etc. to properly inform him of his options and also our concerns about his overall prognosis.  Cath and  coronary intervention and/or bypass surgery do not seem to be prudent in the current clinical situation.  Overall prognosis is guarded.

## 2015-11-06 NOTE — Progress Notes (Signed)
OT Cancellation Note  Patient Details Name: Joshua Park MRN: [id number] DOB: Aug 04, 1970   Cancelled Treatment:    Reason Eval/Treat Not Completed: Other (comment) Pt is Medicaid and current D/C plan is to return to SNF. No apparent immediate acute care OT needs, therefore will defer OT to SNF. If OT eval is needed please call Acute Rehab Dept. at [phone] or text page OT at [phone].  [place], OTR/L  [phone] 11/06/2015 11/06/2015, 2:20 PM

## 2015-11-06 NOTE — Clinical Social Work Note (Signed)
Patient transferred from [facility] to [place]. CSW from [place] verbally notified.   This CSW signing off.  Dayton Scrape, [place]
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
## 2015-11-07 ENCOUNTER — Inpatient Hospital Stay (HOSPITAL_COMMUNITY): Payer: Medicaid Other

## 2015-11-07 DIAGNOSIS — R1011 Right upper quadrant pain: Secondary | ICD-10-CM

## 2015-11-07 DIAGNOSIS — L8990 Pressure ulcer of unspecified site, unspecified stage: Secondary | ICD-10-CM | POA: Insufficient documentation

## 2015-11-07 DIAGNOSIS — R109 Unspecified abdominal pain: Secondary | ICD-10-CM | POA: Insufficient documentation

## 2015-11-07 LAB — BASIC METABOLIC PANEL
ANION GAP: 7 (ref 5–15)
BUN: <5 mg/dL — ABNORMAL LOW (ref 6–20)
CALCIUM: 8.7 mg/dL — AB (ref 8.9–10.3)
CO2: 27 mmol/L (ref 22–32)
CREATININE: 0.76 mg/dL (ref 0.61–1.24)
Chloride: 104 mmol/L (ref 101–111)
Glucose, Bld: 125 mg/dL — ABNORMAL HIGH (ref 65–99)
Potassium: 3.8 mmol/L (ref 3.5–5.1)
SODIUM: 138 mmol/L (ref 135–145)

## 2015-11-07 LAB — CBC
HCT: 28.9 % — ABNORMAL LOW (ref 39.0–52.0)
Hemoglobin: 9 g/dL — ABNORMAL LOW (ref 13.0–17.0)
MCH: 26.8 pg (ref 26.0–34.0)
MCHC: 31.1 g/dL (ref 30.0–36.0)
MCV: 86 fL (ref 78.0–100.0)
PLATELETS: 298 10*3/uL (ref 150–400)
RBC: 3.36 MIL/uL — AB (ref 4.22–5.81)
RDW: 15.3 % (ref 11.5–15.5)
WBC: 7.4 10*3/uL (ref 4.0–10.5)

## 2015-11-07 LAB — LIPASE, BLOOD: LIPASE: 18 U/L (ref 11–51)

## 2015-11-07 MED ORDER — TECHNETIUM TC 99M MEBROFENIN IV KIT
5.1000 | PACK | Freq: Once | INTRAVENOUS | Status: AC | PRN
  Administered 2015-11-07: 5 via INTRAVENOUS

## 2015-11-07 NOTE — Progress Notes (Signed)
Pt was NPO per request from Nuclear Med Dept d/t hidascan. Pt back from procedure and able to eat; currently waiting for pt's food tray from the kitchen. Pt wants to wait until he eats something before he takes medication.    Pt still refusing to be repositioned; myself and current Purple Sitter have asked the patient numerous times if we could reposition him but pt refused.
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
## 2015-11-07 NOTE — Progress Notes (Signed)
       Patient Name: Joshua Park Date of Encounter: 11/07/2015    SUBJECTIVE: Asleep. No difficulty overnight.  TELEMETRY:  Sinus rhythm and sinus bradycardia. No significant arrhythmia.\ Filed Vitals:   11/06/15 0835 11/06/15 1735 11/06/15 2035 11/07/15 0327  BP: 129/93 130/91 115/78 94/51  Pulse: 92 100 95 82  Temp: 98.1 F (36.7 C) 98.4 F (36.9 C) 98.1 F (36.7 C) 98.2 F (36.8 C)  TempSrc: Oral Oral Oral Oral  Resp: 18 20 18    Height:      Weight:      SpO2: 100% 100% 100% 100%    Intake/Output Summary (Last 24 hours) at 11/07/15 0908 Last data filed at 11/07/15 0648  Gross per 24 hour  Intake   3055 ml  Output   2400 ml  Net    655 ml   LABS: Basic Metabolic Panel:  Recent Labs  11/05/15 0357 11/06/15 0528 11/07/15 0444  NA 146* 141 138  K 4.7 3.3* 3.8  CL 107 103 104  CO2 29 28 27   GLUCOSE 133* 91 125*  BUN 7 6 <5*  CREATININE 0.68 0.68 0.76  CALCIUM 8.9 8.8* 8.7*  PHOS 2.0*  --   --    CBC:  Recent Labs  11/06/15 0528 11/07/15 0444  WBC 7.7 7.4  HGB 9.1* 9.0*  HCT 28.9* 28.9*  MCV 85.5 86.0  PLT 276 298   Cardiac Enzymes: No results for input(s): CKTOTAL, CKMB, CKMBINDEX, TROPONINI in the last 72 hours. BNP: Invalid input(s): POCBNP Hemoglobin A1C: No results for input(s): HGBA1C in the last 72 hours. Fasting Lipid Panel: No results for input(s): CHOL, HDL, LDLCALC, TRIG, CHOLHDL, LDLDIRECT in the last 72 hours.  Radiology/Studies:  No new data  Physical Exam: Blood pressure 94/51, pulse 82, temperature 98.2 F (36.8 C), temperature source Oral, resp. rate 18, height 5\' 10"  (1.778 m), weight 114 lb 13.8 oz (52.1 kg), SpO2 100 %. Weight change:   Wt Readings from Last 3 Encounters:  11/05/15 114 lb 13.8 oz (52.1 kg)  10/17/15 115 lb 8 oz (52.39 kg)  10/01/15 115 lb 8 oz (52.39 kg)   Lying flat in bed. No rub or gallop  ASSESSMENT:  1. Recurring episodes of chest pain. Clinically feel that there is the possibility of  coronary disease, and therefore we should air on the side of him. Therapy with beta blocker, when necessary nitrates, and aspirin.  Plan:  As above. Please follow through may be of further assistance.  Demetrios Isaacs 11/07/2015, 9:08 AM

## 2015-11-07 NOTE — Progress Notes (Signed)
HIDA scan with ejection fraction is normal. There is still no explanation for postprandial upper abdominal pain. We will proceed with EGD tomorrow.

## 2015-11-08 ENCOUNTER — Encounter (HOSPITAL_COMMUNITY)
Admission: EM | Disposition: A | Discharge status: Skilled Nursing Facility | Payer: Self-pay | Source: Home / Self Care | Attending: Internal Medicine

## 2015-11-08 ENCOUNTER — Telehealth: Payer: Self-pay | Admitting: Nurse Practitioner

## 2015-11-08 DIAGNOSIS — Z9289 Personal history of other medical treatment: Secondary | ICD-10-CM

## 2015-11-08 LAB — COMPREHENSIVE METABOLIC PANEL
ALK PHOS: 55 U/L (ref 38–126)
ALT: 11 U/L — AB (ref 17–63)
AST: 16 U/L (ref 15–41)
Albumin: 2.2 g/dL — ABNORMAL LOW (ref 3.5–5.0)
Anion gap: 11 (ref 5–15)
BUN: 5 mg/dL — AB (ref 6–20)
CALCIUM: 9 mg/dL (ref 8.9–10.3)
CO2: 26 mmol/L (ref 22–32)
CREATININE: 0.71 mg/dL (ref 0.61–1.24)
Chloride: 102 mmol/L (ref 101–111)
Glucose, Bld: 125 mg/dL — ABNORMAL HIGH (ref 65–99)
Potassium: 3.7 mmol/L (ref 3.5–5.1)
Sodium: 139 mmol/L (ref 135–145)
Total Bilirubin: 0.5 mg/dL (ref 0.3–1.2)
Total Protein: 7.4 g/dL (ref 6.5–8.1)

## 2015-11-08 LAB — CULTURE, BLOOD (ROUTINE X 2)
Culture: NO GROWTH
Culture: NO GROWTH

## 2015-11-08 LAB — CBC
HCT: 29.6 % — ABNORMAL LOW (ref 39.0–52.0)
Hemoglobin: 9.3 g/dL — ABNORMAL LOW (ref 13.0–17.0)
MCH: 27.7 pg (ref 26.0–34.0)
MCHC: 31.4 g/dL (ref 30.0–36.0)
MCV: 88.1 fL (ref 78.0–100.0)
PLATELETS: 300 10*3/uL (ref 150–400)
RBC: 3.36 MIL/uL — AB (ref 4.22–5.81)
RDW: 15 % (ref 11.5–15.5)
WBC: 5.8 10*3/uL (ref 4.0–10.5)

## 2015-11-08 LAB — PHOSPHORUS: PHOSPHORUS: 3 mg/dL (ref 2.5–4.6)

## 2015-11-08 LAB — MAGNESIUM: MAGNESIUM: 1.6 mg/dL — AB (ref 1.7–2.4)

## 2015-11-08 SURGERY — ESOPHAGOGASTRODUODENOSCOPY (EGD) WITH PROPOFOL
Anesthesia: Monitor Anesthesia Care

## 2015-11-08 MED ORDER — OLANZAPINE 2.5 MG PO TABS: 2.5000 mg | ORAL_TABLET | Freq: Every day | ORAL | Status: DC

## 2015-11-08 MED ORDER — OXYCODONE HCL 15 MG PO TABS: 7.5000 mg | ORAL_TABLET | ORAL | Status: DC | PRN

## 2015-11-08 MED ORDER — CARVEDILOL 12.5 MG PO TABS: 12.5000 mg | ORAL_TABLET | Freq: Two times a day (BID) | ORAL | Status: DC

## 2015-11-08 MED ORDER — CLONAZEPAM 0.5 MG PO TABS: 0.5000 mg | ORAL_TABLET | Freq: Every day | ORAL | Status: DC | PRN

## 2015-11-08 MED ORDER — DULOXETINE HCL 30 MG PO CPEP: 30.0000 mg | ORAL_CAPSULE | Freq: Every day | ORAL | Status: DC

## 2015-11-08 MED ORDER — ASPIRIN 81 MG PO CHEW: 81.0000 mg | CHEWABLE_TABLET | Freq: Every day | ORAL | Status: DC

## 2015-11-08 MED ORDER — MAGNESIUM SULFATE 2 GM/50ML IV SOLN
2.0000 g | Freq: Once | INTRAVENOUS | Status: AC
  Administered 2015-11-08: 2 g via INTRAVENOUS
  Filled 2015-11-08: qty 50
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
## 2015-11-08 NOTE — Clinical Social Work Note (Addendum)
Patient medically stable for discharge back to [facility] skilled facility. Discharge information transmitted to facility and Joshua Park will be transported back to facility by ambulance. Patient's mother, Joshua Park contacted (VN left) and informed of discharge [phone]).  Joshua Park, MSW, LCSW Licensed Clinical Social Worker [place] [phone]

## 2015-11-08 NOTE — Progress Notes (Signed)
Patient was previously IVC'd on 11/02/15. Per psych MD patient no longer meets this criteria. This RN verified with Dr. Cruzita Lederer that the IVC has been rescinded at this time. Purple sitter removed from the room at this time.  Joellen Jersey, RN.

## 2015-11-08 NOTE — NC FL2 (Signed)
[place] LEVEL OF CARE SCREENING TOOL     IDENTIFICATION  Patient Name: Joshua Park Birthdate: [birth year] Sex: male Admission Date (Current Location): 11/02/2015  [place] and [place] Number:  Kathleen Argue [id number] [place] and Address:  [facility] [place] [place] [address] [place], [place] [address]      Provider Number: [id number]  Attending Physician Name and Address:  Caren Griffins, MD  Relative Name and Phone Number:  Karilyn Cota - mother. Phone number - [phone]    Current Level of Care: Hospital Recommended Level of Care: [place] Prior Approval Number:    Date Approved/Denied:   PASRR Number:    Discharge Plan: SNF    Current Diagnoses: Patient Active Problem List   Diagnosis Date Noted  . Pressure ulcer 11/07/2015  . Abdominal pain   . Chest pain 11/03/2015  . Abnormal EKG 11/03/2015  . Severe episode of recurrent major depressive disorder, with psychotic features ([place])   . Hypokalemia 11/02/2015  . Elevated troponin 11/02/2015  . Presence of IVC filter 11/01/2015  . Acute osteomyelitis of toe of left foot ([place]) 10/01/2015  . Pressure ulcer of left foot, stage 4 ([place]) 09/03/2015  . Refusal of care by patient 08/28/2015  . Chronic indwelling Foley catheter 06/27/2015  . Hypotension 06/27/2015  . Paralytic syndrome, bilateral ([place]) 05/07/2015  . MDD (major depressive disorder), recurrent, severe, with psychosis ([place]) 03/16/2015  . Protein-calorie malnutrition, severe ([place]) 03/16/2015  . Chronic pain syndrome 02/13/2015  . Paranoia (psychosis) ([place]) 12/17/2014  . GERD (gastroesophageal reflux disease) 03/21/2014  . Constipation 05/14/2012  . Paraplegia ([place]) 02/09/2007  . Neurogenic bladder 02/09/2007    Orientation RESPIRATION BLADDER Height & Weight     Self, Time, Situation, Place  Normal Indwelling catheter Weight: 114 lb 13.8 oz (52.1 kg) Height:  5\' 10"  (177.8 cm)  BEHAVIORAL  SYMPTOMS/MOOD NEUROLOGICAL BOWEL NUTRITION STATUS      Incontinent Diet (Heart Healthy)  AMBULATORY STATUS COMMUNICATION OF NEEDS Skin   Total Care Verbally Other (Comment) (Deep tissue injury to right heel; stage 2 PU to mid sacrum; Stage 2 PU to left and right buttocks.)                       Personal Care Assistance Level of Assistance  Bathing, Feeding, Dressing (Paraplegia) Bathing Assistance: Maximum assistance Feeding assistance: Independent Dressing Assistance: Maximum assistance     Functional Limitations Info  Sight, Hearing, Speech Sight Info: Adequate Hearing Info: Adequate Speech Info: Adequate    SPECIAL CARE FACTORS FREQUENCY  PT (By licensed PT)     PT Frequency: Evaluated 5/2              Contractures Contractures Info: Not present    Additional Factors Info  Code Status, Allergies Code Status Info: Full code Allergies Info: No known allergies           Current Medications (11/08/2015):  This is the current hospital active medication list Current Facility-Administered Medications  Medication Dose Route Frequency Provider Last Rate Last Dose  . 0.9 %  sodium chloride infusion   Intravenous Continuous Albertine Patricia, MD 75 mL/hr at 11/08/15 0535    . acetaminophen (TYLENOL) tablet 650 mg  650 mg Oral Q6H PRN Toy Baker, MD       Or  . acetaminophen (TYLENOL) suppository 650 mg  650 mg Rectal Q6H PRN Toy Baker, MD      . antiseptic oral rinse (CPC /  CETYLPYRIDINIUM CHLORIDE 0.05%) solution 7 mL  7 mL Mouth Rinse BID Albertine Patricia, MD   7 mL at 11/08/15 0935  . aspirin chewable tablet 81 mg  81 mg Oral Daily Toy Baker, MD   81 mg at 11/08/15 0935  . baclofen (LIORESAL) tablet 10 mg  10 mg Oral BID Toy Baker, MD   10 mg at 11/08/15 0934  . carvedilol (COREG) tablet 12.5 mg  12.5 mg Oral BID WC Belva Crome, MD   12.5 mg at 11/08/15 0934  . clonazePAM (KLONOPIN) tablet 0.5 mg  0.5 mg Oral BID PRN Toy Baker, MD   0.5 mg at 11/05/15 0031  . DULoxetine (CYMBALTA) DR capsule 30 mg  30 mg Oral Daily Albertine Patricia, MD   30 mg at 11/07/15 1539  . feeding supplement (ENSURE ENLIVE) (ENSURE ENLIVE) liquid 237 mL  237 mL Oral BID BM Albertine Patricia, MD   237 mL at 11/08/15 0935  . feeding supplement (PRO-STAT SUGAR FREE 64) liquid 30 mL  30 mL Oral TID BM Silver Huguenin Elgergawy, MD   30 mL at 11/05/15 1041  . folic acid (FOLVITE) tablet 1 mg  1 mg Oral Daily Albertine Patricia, MD   1 mg at 11/08/15 0934  . gabapentin (NEURONTIN) capsule 100 mg  100 mg Oral BID Toy Baker, MD   100 mg at 11/08/15 0935  . heparin injection 5,000 Units  5,000 Units Subcutaneous Q8H Albertine Patricia, MD   5,000 Units at 11/08/15 0535  . hydrALAZINE (APRESOLINE) injection 5 mg  5 mg Intravenous Q6H PRN Albertine Patricia, MD      . magnesium sulfate IVPB 2 g 50 mL  2 g Intravenous Once Costin Karlyne Greenspan, MD      . multivitamin with minerals tablet 1 tablet  1 tablet Oral Daily Albertine Patricia, MD   1 tablet at 11/08/15 0935  . nitroGLYCERIN (NITROSTAT) SL tablet 0.4 mg  0.4 mg Sublingual Q5 min PRN Belva Crome, MD      . OLANZapine [facility]) tablet 2.5 mg  2.5 mg Oral QHS Albertine Patricia, MD   2.5 mg at 11/06/15 2147  . ondansetron (ZOFRAN) tablet 4 mg  4 mg Oral Q6H PRN Toy Baker, MD       Or  . ondansetron (ZOFRAN) injection 4 mg  4 mg Intravenous Q6H PRN Toy Baker, MD   4 mg at 11/06/15 2148  . oxyCODONE (Oxy IR/ROXICODONE) immediate release tablet 7.5 mg  7.5 mg Oral Q4H PRN Toy Baker, MD   7.5 mg at 11/07/15 2146  . pantoprazole (PROTONIX) EC tablet 40 mg  40 mg Oral Daily Albertine Patricia, MD   40 mg at 11/08/15 0935  . sodium chloride flush (NS) 0.9 % injection 3 mL  3 mL Intravenous Q12H Toy Baker, MD   3 mL at 11/07/15 2133  . thiamine (VITAMIN B-1) tablet 100 mg  100 mg Oral Daily Albertine Patricia, MD   100 mg at 11/08/15 0935     Discharge  Medications: Please see discharge summary for a list of discharge medications.  Relevant Imaging Results:  Relevant Lab Results:   Additional Information    Sharlet Salina, Mila Homer, LCSW

## 2015-11-08 NOTE — Care Management Note (Signed)
Case Management Note  Patient Details  Name: Joshua Park MRN: [id number] Date of Birth: 14-Nov-1970  Subjective/Objective:     Neurogenic bladder, chronic pain syndrome, constipation               Action/Plan: Discharge Planning: AVS reviewed:  Chart reviewed and pt scheduled to dc back to SNF[facility]. CSW following for SNF placement  Expected Discharge Date:  11/08/2015              Expected Discharge Plan:  Skilled Nursing Facility  In-House Referral:  Clinical Social Work  Discharge planning Services  CM Consult  Post Acute Care Choice:  NA Choice offered to:  NA  DME Arranged:  N/A DME Agency:  NA  HH Arranged:  NA HH Agency:  NA  Status of Service:  Completed, signed off  Medicare Important Message Given:    Date Medicare IM Given:    Medicare IM give by:    Date Additional Medicare IM Given:    Additional Medicare Important Message give by:     If discussed at [place] of Stay Meetings, dates discussed:    Additional Comments:  Erenest Rasher, RN 11/08/2015, 11:59 AM

## 2015-11-08 NOTE — Progress Notes (Signed)
Patient refusing EGD.  MD notified.  Jillyn Ledger, MBA, BSN, RN

## 2015-11-08 NOTE — Progress Notes (Signed)
Pt prepared for d/c to SNF. IV d/c'd. Skin intact except as charted in most recent assessments. Vitals are stable. Report called to [facility] (receiving facility). Pt to be transported by ambulance service.  Jillyn Ledger, MBA, BSN, RN

## 2015-11-09 ENCOUNTER — Other Ambulatory Visit: Payer: Self-pay | Admitting: *Deleted

## 2015-11-09 MED ORDER — CLONAZEPAM 0.5 MG PO TABS: ORAL_TABLET | ORAL | Status: DC

## 2015-11-09 MED ORDER — OXYCODONE HCL 15 MG PO TABS: 7.5000 mg | ORAL_TABLET | ORAL | Status: DC | PRN

## 2015-11-09 NOTE — Telephone Encounter (Signed)
[place]

## 2015-11-12 ENCOUNTER — Encounter: Payer: Self-pay | Admitting: Adult Health

## 2015-11-12 ENCOUNTER — Non-Acute Institutional Stay (SKILLED_NURSING_FACILITY): Payer: Medicaid Other | Admitting: Adult Health

## 2015-11-12 DIAGNOSIS — R000 Tachycardia, unspecified: Secondary | ICD-10-CM

## 2015-11-12 DIAGNOSIS — I10 Essential (primary) hypertension: Secondary | ICD-10-CM | POA: Insufficient documentation

## 2015-11-12 DIAGNOSIS — N319 Neuromuscular dysfunction of bladder, unspecified: Secondary | ICD-10-CM | POA: Diagnosis not present

## 2015-11-12 DIAGNOSIS — Z9289 Personal history of other medical treatment: Secondary | ICD-10-CM | POA: Diagnosis not present

## 2015-11-12 DIAGNOSIS — Z960 Presence of urogenital implants: Secondary | ICD-10-CM

## 2015-11-12 DIAGNOSIS — R0989 Other specified symptoms and signs involving the circulatory and respiratory systems: Secondary | ICD-10-CM

## 2015-11-12 DIAGNOSIS — G894 Chronic pain syndrome: Secondary | ICD-10-CM | POA: Diagnosis not present

## 2015-11-12 DIAGNOSIS — L89894 Pressure ulcer of other site, stage 4: Secondary | ICD-10-CM

## 2015-11-12 DIAGNOSIS — F333 Major depressive disorder, recurrent, severe with psychotic symptoms: Secondary | ICD-10-CM

## 2015-11-12 DIAGNOSIS — G839 Paralytic syndrome, unspecified: Secondary | ICD-10-CM

## 2015-11-12 DIAGNOSIS — E43 Unspecified severe protein-calorie malnutrition: Secondary | ICD-10-CM

## 2015-11-12 DIAGNOSIS — Z978 Presence of other specified devices: Secondary | ICD-10-CM

## 2015-11-12 DIAGNOSIS — R030 Elevated blood-pressure reading, without diagnosis of hypertension: Secondary | ICD-10-CM

## 2015-11-12 NOTE — Telephone Encounter (Signed)
Close encounter 
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
## 2015-11-13 ENCOUNTER — Non-Acute Institutional Stay (SKILLED_NURSING_FACILITY): Payer: Medicaid Other | Admitting: Internal Medicine

## 2015-11-13 ENCOUNTER — Encounter: Payer: Self-pay | Admitting: Internal Medicine

## 2015-11-13 DIAGNOSIS — L89894 Pressure ulcer of other site, stage 4: Secondary | ICD-10-CM

## 2015-11-13 DIAGNOSIS — K219 Gastro-esophageal reflux disease without esophagitis: Secondary | ICD-10-CM | POA: Diagnosis not present

## 2015-11-13 DIAGNOSIS — R627 Adult failure to thrive: Secondary | ICD-10-CM

## 2015-11-13 DIAGNOSIS — Z9289 Personal history of other medical treatment: Secondary | ICD-10-CM

## 2015-11-13 DIAGNOSIS — F333 Major depressive disorder, recurrent, severe with psychotic symptoms: Secondary | ICD-10-CM

## 2015-11-13 DIAGNOSIS — Z960 Presence of urogenital implants: Secondary | ICD-10-CM

## 2015-11-13 DIAGNOSIS — Z978 Presence of other specified devices: Secondary | ICD-10-CM

## 2015-11-13 DIAGNOSIS — G8220 Paraplegia, unspecified: Secondary | ICD-10-CM | POA: Diagnosis not present

## 2015-11-13 DIAGNOSIS — F22 Delusional disorders: Secondary | ICD-10-CM | POA: Diagnosis not present

## 2015-11-13 DIAGNOSIS — G894 Chronic pain syndrome: Secondary | ICD-10-CM

## 2015-11-13 DIAGNOSIS — E43 Unspecified severe protein-calorie malnutrition: Secondary | ICD-10-CM
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
## 2015-11-19 ENCOUNTER — Ambulatory Visit: Payer: Medicaid Other | Admitting: Nurse Practitioner

## 2015-11-29 ENCOUNTER — Encounter: Payer: Self-pay | Admitting: Nurse Practitioner

## 2015-11-29 ENCOUNTER — Encounter: Payer: Self-pay | Admitting: Adult Health

## 2015-11-29 ENCOUNTER — Non-Acute Institutional Stay (SKILLED_NURSING_FACILITY): Payer: Medicaid Other | Admitting: Adult Health

## 2015-11-29 DIAGNOSIS — G894 Chronic pain syndrome: Secondary | ICD-10-CM

## 2015-11-29 DIAGNOSIS — E43 Unspecified severe protein-calorie malnutrition: Secondary | ICD-10-CM | POA: Diagnosis not present

## 2015-11-29 DIAGNOSIS — G839 Paralytic syndrome, unspecified: Secondary | ICD-10-CM
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
## 2015-12-05 ENCOUNTER — Non-Acute Institutional Stay (SKILLED_NURSING_FACILITY): Payer: Medicaid Other | Admitting: Adult Health

## 2015-12-05 ENCOUNTER — Encounter: Payer: Self-pay | Admitting: Adult Health

## 2015-12-05 DIAGNOSIS — E43 Unspecified severe protein-calorie malnutrition: Secondary | ICD-10-CM

## 2015-12-05 DIAGNOSIS — G839 Paralytic syndrome, unspecified: Secondary | ICD-10-CM | POA: Diagnosis not present

## 2015-12-05 DIAGNOSIS — R627 Adult failure to thrive: Secondary | ICD-10-CM

## 2015-12-05 DIAGNOSIS — G894 Chronic pain syndrome: Secondary | ICD-10-CM
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
## 2015-12-10 ENCOUNTER — Non-Acute Institutional Stay (SKILLED_NURSING_FACILITY): Payer: Medicaid Other | Admitting: Adult Health

## 2015-12-10 ENCOUNTER — Emergency Department (HOSPITAL_COMMUNITY)
Admission: EM | Admit: 2015-12-10 | Discharge: 2015-12-12 | Disposition: A | Discharge status: Home / Self Care | Payer: Medicaid Other | Attending: Emergency Medicine | Admitting: Emergency Medicine

## 2015-12-10 ENCOUNTER — Emergency Department (HOSPITAL_COMMUNITY): Payer: Medicaid Other

## 2015-12-10 ENCOUNTER — Encounter (HOSPITAL_COMMUNITY): Payer: Self-pay | Admitting: Emergency Medicine

## 2015-12-10 ENCOUNTER — Encounter: Payer: Self-pay | Admitting: Adult Health

## 2015-12-10 DIAGNOSIS — F329 Major depressive disorder, single episode, unspecified: Secondary | ICD-10-CM | POA: Insufficient documentation

## 2015-12-10 DIAGNOSIS — F333 Major depressive disorder, recurrent, severe with psychotic symptoms: Secondary | ICD-10-CM

## 2015-12-10 DIAGNOSIS — R627 Adult failure to thrive: Secondary | ICD-10-CM | POA: Insufficient documentation

## 2015-12-10 DIAGNOSIS — F330 Major depressive disorder, recurrent, mild: Secondary | ICD-10-CM | POA: Diagnosis present

## 2015-12-10 DIAGNOSIS — G894 Chronic pain syndrome: Secondary | ICD-10-CM | POA: Diagnosis not present

## 2015-12-10 DIAGNOSIS — L97419 Non-pressure chronic ulcer of right heel and midfoot with unspecified severity: Secondary | ICD-10-CM | POA: Diagnosis not present

## 2015-12-10 DIAGNOSIS — Z87891 Personal history of nicotine dependence: Secondary | ICD-10-CM | POA: Insufficient documentation

## 2015-12-10 DIAGNOSIS — R0602 Shortness of breath: Secondary | ICD-10-CM | POA: Insufficient documentation

## 2015-12-10 DIAGNOSIS — R45851 Suicidal ideations: Secondary | ICD-10-CM | POA: Insufficient documentation

## 2015-12-10 DIAGNOSIS — L97429 Non-pressure chronic ulcer of left heel and midfoot with unspecified severity: Secondary | ICD-10-CM | POA: Diagnosis not present

## 2015-12-10 LAB — ETHANOL

## 2015-12-10 LAB — COMPREHENSIVE METABOLIC PANEL
ALK PHOS: 87 U/L (ref 38–126)
ALT: 16 U/L — ABNORMAL LOW (ref 17–63)
ANION GAP: 10 (ref 5–15)
AST: 19 U/L (ref 15–41)
Albumin: 3.8 g/dL (ref 3.5–5.0)
BILIRUBIN TOTAL: 0.3 mg/dL (ref 0.3–1.2)
BUN: 13 mg/dL (ref 6–20)
CALCIUM: 9.9 mg/dL (ref 8.9–10.3)
CO2: 23 mmol/L (ref 22–32)
Chloride: 102 mmol/L (ref 101–111)
Creatinine, Ser: 0.49 mg/dL — ABNORMAL LOW (ref 0.61–1.24)
Glucose, Bld: 108 mg/dL — ABNORMAL HIGH (ref 65–99)
Potassium: 4.2 mmol/L (ref 3.5–5.1)
SODIUM: 135 mmol/L (ref 135–145)
TOTAL PROTEIN: 9.1 g/dL — AB (ref 6.5–8.1)

## 2015-12-10 LAB — CBC WITH DIFFERENTIAL/PLATELET
BASOS ABS: 0 10*3/uL (ref 0.0–0.1)
BASOS PCT: 0 %
EOS ABS: 0.2 10*3/uL (ref 0.0–0.7)
EOS PCT: 2 %
HCT: 35.5 % — ABNORMAL LOW (ref 39.0–52.0)
HEMOGLOBIN: 11.5 g/dL — AB (ref 13.0–17.0)
Lymphocytes Relative: 23 %
Lymphs Abs: 1.6 10*3/uL (ref 0.7–4.0)
MCH: 27.6 pg (ref 26.0–34.0)
MCHC: 32.4 g/dL (ref 30.0–36.0)
MCV: 85.3 fL (ref 78.0–100.0)
Monocytes Absolute: 0.3 10*3/uL (ref 0.1–1.0)
Monocytes Relative: 5 %
NEUTROS PCT: 70 %
Neutro Abs: 4.6 10*3/uL (ref 1.7–7.7)
PLATELETS: 318 10*3/uL (ref 150–400)
RBC: 4.16 MIL/uL — AB (ref 4.22–5.81)
RDW: 15.8 % — ABNORMAL HIGH (ref 11.5–15.5)
WBC: 6.7 10*3/uL (ref 4.0–10.5)

## 2015-12-10 LAB — RAPID URINE DRUG SCREEN, HOSP PERFORMED
AMPHETAMINES: NOT DETECTED
Barbiturates: NOT DETECTED
Benzodiazepines: NOT DETECTED
COCAINE: NOT DETECTED
OPIATES: NOT DETECTED
TETRAHYDROCANNABINOL: NOT DETECTED

## 2015-12-10 MED ORDER — DOCUSATE SODIUM 100 MG PO CAPS
100.0000 mg | ORAL_CAPSULE | Freq: Two times a day (BID) | ORAL | Status: DC
Start: 2015-12-10 — End: 2015-12-12
  Administered 2015-12-10 – 2015-12-11 (×2): 100 mg via ORAL
  Filled 2015-12-10 (×3): qty 1

## 2015-12-10 MED ORDER — OXYMETAZOLINE HCL 0.05 % NA SOLN
1.0000 | Freq: Once | NASAL | Status: AC
  Administered 2015-12-10: 1 via NASAL
  Filled 2015-12-10: qty 15

## 2015-12-10 MED ORDER — PRO-STAT SUGAR FREE PO LIQD
30.0000 mL | Freq: Two times a day (BID) | ORAL | Status: DC
  Filled 2015-12-10 (×5): qty 30

## 2015-12-10 MED ORDER — OXYCODONE HCL 5 MG PO TABS
7.5000 mg | ORAL_TABLET | ORAL | Status: DC | PRN
  Administered 2015-12-10 – 2015-12-12 (×4): 7.5 mg via ORAL
  Filled 2015-12-10 (×4): qty 2

## 2015-12-10 MED ORDER — LORAZEPAM 1 MG PO TABS
1.0000 mg | ORAL_TABLET | Freq: Three times a day (TID) | ORAL | Status: DC | PRN
Start: 2015-12-10 — End: 2015-12-12
  Filled 2015-12-10 (×2): qty 1

## 2015-12-10 MED ORDER — OXYCODONE HCL 5 MG PO TABS
15.0000 mg | ORAL_TABLET | Freq: Once | ORAL | Status: AC
  Administered 2015-12-10: 10 mg via ORAL
  Filled 2015-12-10: qty 3

## 2015-12-10 MED ORDER — GABAPENTIN 100 MG PO CAPS
100.0000 mg | ORAL_CAPSULE | Freq: Two times a day (BID) | ORAL | Status: DC
  Administered 2015-12-10 – 2015-12-12 (×4): 100 mg via ORAL
  Filled 2015-12-10 (×4): qty 1

## 2015-12-10 MED ORDER — HYDROCERIN EX CREA
TOPICAL_CREAM | Freq: Every day | CUTANEOUS | Status: DC
  Administered 2015-12-11 – 2015-12-12 (×2): via TOPICAL
  Filled 2015-12-10: qty 113

## 2015-12-10 MED ORDER — MOISTURE EX CREA: TOPICAL_CREAM | Freq: Every day | CUTANEOUS | Status: DC

## 2015-12-10 MED ORDER — POLYETHYLENE GLYCOL 3350 17 G PO PACK
17.0000 g | PACK | Freq: Every day | ORAL | Status: DC
  Filled 2015-12-10 (×2): qty 1

## 2015-12-10 MED ORDER — COLLAGENASE 250 UNIT/GM EX OINT
1.0000 "application " | TOPICAL_OINTMENT | Freq: Every day | CUTANEOUS | Status: DC
  Administered 2015-12-11 – 2015-12-12 (×2): 1 via TOPICAL
  Filled 2015-12-10: qty 30

## 2015-12-10 MED ORDER — BACLOFEN 10 MG PO TABS
10.0000 mg | ORAL_TABLET | Freq: Two times a day (BID) | ORAL | Status: DC
  Administered 2015-12-10 – 2015-12-12 (×4): 10 mg via ORAL
  Filled 2015-12-10 (×5): qty 1

## 2015-12-10 NOTE — ED Notes (Signed)
Pt wanted home med nasal decongestant  ( 1 spray left and right nasal passage.

## 2015-12-10 NOTE — ED Notes (Signed)
Pt rolled to remove sleeping bag pt was brought in on. When rolled, pt had multiple pressure ulcers on lower back and sacrum  as well as various debris and trash under his buttocks and back. A remote, pringle lid, crumbs, and a wrapped fork were taken out from under him. Pt wiped down. Pt stated "I feel like I might be pooping." Pt Appeared to be defecating. There were multiple piles of fecal matter in sleeping bag.

## 2015-12-10 NOTE — ED Notes (Signed)
Pt denies SI/HI/AV and when informed of IVC paperwork, Pt reports "thats ridiculous, you can't even kill yourself by putting a fork in a socket."  Pt c/o "stuffy nose" x "a while."  Sts he has "an infection throughout his body" and he "is septic."  Also, Pt concerned about a "disease he has in his bones."   After vital signs were completed and WDL, Pt reassured regarding breathing status.
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
## 2015-12-10 NOTE — ED Notes (Signed)
Pt received reporting SOB, rr 20, O2 sat 100.

## 2015-12-11 ENCOUNTER — Telehealth: Payer: Self-pay | Admitting: Internal Medicine

## 2015-12-11 DIAGNOSIS — F330 Major depressive disorder, recurrent, mild: Secondary | ICD-10-CM | POA: Diagnosis not present

## 2015-12-11 MED ORDER — CITALOPRAM HYDROBROMIDE 20 MG PO TABS
20.0000 mg | ORAL_TABLET | Freq: Every day | ORAL | Status: DC
  Filled 2015-12-11 (×2): qty 1

## 2015-12-11 NOTE — Progress Notes (Signed)
This writer completed a chart review for disposition.    Jaianna Nicoll, MSW, LCSW, LCAS [facility] Triage Specialist [phone] 

## 2015-12-11 NOTE — Progress Notes (Addendum)
CSW spoke with the Admissions team via speaker phone at [facility] to inform them that patient had been psychiatrically cleared for discharge and was ready to be sent back to their facility. They stated if he does this again, they will send him back. CSW informed NP. NP informed Nurse.  CSW received a voicemail from [place] from [facility] [place]. CSW returned the call and was informed that she was in a meeting. Left name and number for return call.   CSW spoke with NP who states she spoke with the [place] Director and patient is cleared for discharge. She stated she gave the MD name and contact number at [facility], as she spoke with someone at [facility] for their facility to speak with MD.    CSW spoke with Kieth Brightly with [facility] [phone] who states the MD at [facility], Dr. Eulas Post is insisting she speak with a physician before taking patient back to their facility. CSW provided contact information again for them to speak with the [facility] Medical Director.  Genice Rouge [id number] ED CSW 12/11/2015 12:24 PM

## 2015-12-11 NOTE — [facility] Suicide Risk Assessment (Signed)
Suicide Risk Assessment  Discharge Assessment   [facility] Discharge Suicide Risk Assessment   Principal Problem: Major depressive disorder, recurrent episode, mild ([place]) Discharge Diagnoses:  Patient Active Problem List   Diagnosis Date Noted  . Major depressive disorder, recurrent episode, mild ([place]) [F33.0] 12/11/2015    Priority: High  . Suicidal ideation [R45.851] 12/10/2015  . Failure to thrive in adult [R62.7] 12/05/2015  . Tachycardia with hypertension [R09.89, R03.0] 11/12/2015  . Pressure ulcer [L89.90] 11/07/2015  . Abdominal pain [R10.9]   . Chest pain [R07.9] 11/03/2015  . Abnormal EKG [R94.31] 11/03/2015  . Severe episode of recurrent major depressive disorder, with psychotic features ([place]) [F33.3]   . Hypokalemia [E87.6] 11/02/2015  . Elevated troponin [R79.89] 11/02/2015  . Presence of IVC filter [Z95.828] 11/01/2015  . Acute osteomyelitis of toe of left foot ([place]) [M86.172] 10/01/2015  . Pressure ulcer of left foot, stage 4 ([place]) [L89.894] 09/03/2015  . Refusal of care by patient [Z53.29] 08/28/2015  . Chronic indwelling Foley catheter [Z92.89] 06/27/2015  . Hypotension [I95.9] 06/27/2015  . 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 MDD (major depressive disorder), recurrent, severe, with psychosis ([place]) [F33.3] 03/16/2015  . Protein-calorie malnutrition, severe ([place]) [E43] 03/16/2015  . Chronic pain syndrome [G89.4] 02/13/2015  . Paranoia (psychosis) ([place]) [F22] 12/17/2014  . GERD (gastroesophageal reflux disease) [K21.9] 03/21/2014  . Constipation [K59.00] 05/14/2012  . Paraplegia ([place]) [G82.20] 02/09/2007  . Neurogenic bladder [N31.9] 02/09/2007    Total Time spent with patient: 45 minutes  Musculoskeletal: Strength & Muscle Tone: quadriplegic, some use of left arm Gait & Station: unable to stand Patient leans: N/A  Psychiatric Specialty Exam: Physical Exam  Constitutional: He is oriented to person, place, and time.  HENT:  Head:  Normocephalic.  Neurological: He is alert and oriented to person, place, and time.  Skin: Skin is warm and dry.  Psychiatric: His speech is normal and behavior is normal. Judgment and thought content normal. Cognition and memory are normal. He exhibits a depressed mood.    Review of Systems  Constitutional: Negative.   HENT: Negative.   Eyes: Negative.   Respiratory: Negative.   Cardiovascular: Negative.   Gastrointestinal: Negative.   Genitourinary: Negative.   Musculoskeletal:       Quadriplegic   Skin: Negative.   Neurological: Negative.   Endo/Heme/Allergies: Negative.   Psychiatric/Behavioral: Positive for depression.    Blood pressure 115/73, pulse 94, temperature 98.1 F (36.7 C), temperature source Oral, resp. rate 16, SpO2 100 %.There is no weight on file to calculate BMI.  General Appearance: Disheveled  Eye Contact:  Good  Speech:  Normal Rate  Volume:  Normal  Mood:  Depressed  Affect:  Congruent  Thought Process:  Coherent  Orientation:  Full (Time, Place, and Person)  Thought Content:  WDL  Suicidal Thoughts:  No  Homicidal Thoughts:  No  Memory:  Immediate;   Good Recent;   Good Remote;   Good  Judgement:  Fair  Insight:  Fair  Psychomotor Activity:  Decreased  Concentration:  Concentration: Good and Attention Span: Good  Recall:  Good  Fund of Knowledge:  Fair  Language:  Good  Akathisia:  No  Handed:  Right  AIMS (if indicated):     Assets:  Housing Leisure Time Resilience Social Support  ADL's:  Impaired  Cognition:  WNL  Sleep:      Mental Status Per Nursing Assessment::   On Admission:   suicidal ideations  Demographic Factors:  Male  Loss Factors:  Decline in physical health  Historical Factors: NA  Risk Reduction Factors:   Sense of responsibility to family  Continued Clinical Symptoms:  Depression, mild  Cognitive Features That Contribute To Risk:  None    Suicide Risk:  Minimal: No identifiable suicidal ideation.   Patients presenting with no risk factors but with morbid ruminations; may be classified as minimal risk based on the severity of the depressive symptoms    Plan Of Care/Follow-up recommendations:  Activity:  as tolerated Diet:  heart healthy diet  Jaqualin Serpa, NP 12/11/2015, 11:12 AM

## 2015-12-11 NOTE — ED Notes (Signed)
Pt reporting being unable to catch his breath, pt respiratory rate 18, O2 sat 100 on room air, pt repositioned and HOB elevated to 40 deg in attempt to aid in breathing, pt still reporting being SOB. Will report to oncoming shift.

## 2015-12-11 NOTE — ED Notes (Signed)
Pt continues to complain that he has difficulty breathing, "I  caint breathe mam, I caint breathe." He has no difficulty speaking and does not appear to be in respiratory distress. VS rechecked and O2 100% on room air (see flow sheet). He did not want to be pulled up in the bed, but staff pulled him up a little higher to assist his lung expansion.  Ativan offered for anxiety, but her refused. He refused most of his medications.

## 2015-12-11 NOTE — Telephone Encounter (Signed)
Reviewed pt ED chart and he has been cleared by psych to return to [facility] and IVC reversed. I did discuss case with Psych NP Waylan Boga and he was not believed to be suicidal at the time of his exam by psych. Psych physician, Dr Darleene Cleaver, unavailable to discuss case. LMOVM for Dr Hampton Abbot, Psych Chief of Staff, to return call to discuss case.   Over the last several weeks, pt has been unwilling and refusing care at [facility]. He has refused baths, taking most of medications, grooming and po intake (food/liquids). Medications have been found in his bed, including narcotics. He has refused to speak with [facility] psych services and has refused to take his psych meds. Per current ED record, he continues to ask for assistance to harm himself. After speaking with administration and social work at [facility] and reviewing the medical record, pt is not safe to return to SNF until his mental status has improved, he is taking all of his medication as ordered and participating in his care, and no longer suicidal. We are attempting to contact family to discuss further options for him.

## 2015-12-11 NOTE — ED Notes (Signed)
Pt is not cooperative with nursing care. He refuses to be given a bath. He has decubitus on his right foot and several in the sacral area. There are dressings on some of the wounds. His skin is dry and flaking. Pt reports the sensation that he cannot breathe, but O2 sats are 100% on room air. He keeps asking for respiratory to be sent to him. He had BM this morning.

## 2015-12-11 NOTE — Progress Notes (Signed)
CSW spoke with NP who confirms that the pt is psychiatrically clear and is up for discharge.   4:30 Patient is a resident at [facility]. CSW reached out to facility and spoke with [place]/Admissions and informed her that pt is up for discharge. She states that the facility physician wanted to speak with a doctor. CSW requested to speak with the facilities Market researcher. However, staff states that director is not available Therefore, CSW spoke with facility Administrator/ Larna Daughters.  Information systems manager states that she Office manager, Dr.Carter wanted to speak with a physician and not a NP before they would be able to take the pt back.  4:42 CSW reached out to Dr.Kumar and left a message stating that Althea Charon is currently requesting to speak with a physician.   4:58 Kieth Brightly Mckoy/[employer] reached out to [place]. She states she spoke with Dr.Carter and made physician aware that they would have to accept the pt back. She states that after the discussion they believed  due to pt's history it would be more appropriate to take this patient back in the morning in order to " set some thins in place". Information systems manager states that their facility would like to suicide prevent the room for patient. She ask if it is possible to accept this pt in the morning. CSW informed her that the pt has been psychiatrically cleared and is up for discharge, but that she will inform NP of the request.  5:02 CSW reached out to NP and informed her of Opal Sidles request to accept the patient back in the morning in order to suicide prevent room. NP states states that pt can stay in [place]. However, she states the pt is still up for discharge and patient should be discharged in the morning by 9:00am.  5:04 CSW reached out to Information systems manager and informed her that the NP states that the pt will be kept in [facility], but is expected to ready to returned to the facility by 9:00am. Administrator  expressed understanding and states that they will accept the pt back, and that they will be ready for pt by [place], [place] ED CSW 12/11/2015 5:42 PM

## 2015-12-11 NOTE — ED Notes (Signed)
Pt asked for assisted suicide. He begged that this writer tell him who can do this for him. Reported to Dr. Darleene Cleaver and Waylan Boga, NP.
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
## 2015-12-11 NOTE — Progress Notes (Signed)
Patient was given a bath.
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
## 2015-12-12 NOTE — Consult Note (Signed)
WOC wound consult note Patient with chronic wounds to sacrum.  Wound care has been provided by bedside nurses (Santyl enzymatic debrider) and patient is being discharged back to SNF this AM.  No further wound care needs at this time.  Will not follow at this time.  Please re-consult if needed.  Domenic Moras RN BSN [place] Pager [phone]

## 2015-12-12 NOTE — ED Notes (Signed)
Pt given bath by staff. Noted two decubitus on sacrum. Loose BM evident. Notified Pharmacy to send more Santyl in order to do dressing change prior to d/c back to residence. Pt continues to complain of feeling that he cannot breathe: Ativan offered and pt continued to refuse.

## 2015-12-12 NOTE — Progress Notes (Signed)
Pt was given a bedbath with much resistance. Pt was placed on his right side and positioned with pillows due to two sacral decubiti the size of a tennis ball. Pt primary nurse aware and will pack both wounds with santel. Pt keeps saying he can not breath but at this time does not appear in respiratory distress. Pt pulse ox is: 100%. He did have a large soft BM. Reported off to [place] , pts primary nurse.

## 2015-12-12 NOTE — ED Notes (Signed)
States " I need to call my mother, discussed policy of no phone calls after 2100. Patient then goes on to say I can't breath, and I am suffering, I do not want to live like this. VSS  Breath sounds clear through lung fields. Offered patient ativan 1 mg P.O for anxiety, patient refused.
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
## 2015-12-12 NOTE — ED Notes (Signed)
Pt transported by ambulance by [facility]. Report called to [employer] LPN and Tonye Royalty RN. Report included advise that pt arrived in extremely unclean condition with feces and food crumbs in sleeping bag, stage 4 decubi on sacrum with no dressing, extreme body odor suggesting no bath for a long period of time, dry skin flaking all over the bed, feet with decubitus on right heel (dressing intact), no bunny boots or other support to keep feet off of bed. Their defense was that he refuses care and that he might complain to the [facility] [employer] if they give care that he refuses. Informed them that if he returns in that condition we will have no choice but to advocate for him.

## 2015-12-12 NOTE — ED Notes (Signed)
Called report to Rachael shaffer RN and [employer] LPN for pt to return. Encouraged them to provide more hygiene care for pt as he was found in poor condition with crumbs in his bed, multiple decubi without dressings, skin dry and flaking off into the bed. They insisted that he is given a bath once a week with Psychiatrist present. They insist that if they give care that pt refuses and he reports this to [facility], then they will be cited. Informed them that in future visits to [facility] staff will have to advocate for pt to receive appropriate care.

## 2015-12-12 NOTE — ED Notes (Signed)
Informed patient that mother does not wish to speak to him at this time, then reports that he can not breath  no acute distress noted at this time. Patient refuses to allow staff to take vitals signs.

## 2015-12-14 NOTE — Telephone Encounter (Signed)
Noted  

## 2015-12-14 NOTE — Telephone Encounter (Signed)
Returned call from Dr. Dwyane Dee, pt doesn't need inpatient care, he was seen by Dr. Darleene Cleaver and pt is depressed because of his situation, quadriplegia and pt needs outpatient treatment and or therapist at the nursing home. Please advise

## 2015-12-18 ENCOUNTER — Non-Acute Institutional Stay (SKILLED_NURSING_FACILITY): Payer: Medicaid Other | Admitting: Internal Medicine

## 2015-12-18 ENCOUNTER — Encounter: Payer: Self-pay | Admitting: Internal Medicine

## 2015-12-18 DIAGNOSIS — R627 Adult failure to thrive: Secondary | ICD-10-CM | POA: Diagnosis not present

## 2015-12-18 DIAGNOSIS — Z960 Presence of urogenital implants: Secondary | ICD-10-CM

## 2015-12-18 DIAGNOSIS — R45851 Suicidal ideations: Secondary | ICD-10-CM

## 2015-12-18 DIAGNOSIS — Z9289 Personal history of other medical treatment: Secondary | ICD-10-CM | POA: Diagnosis not present

## 2015-12-18 DIAGNOSIS — G8220 Paraplegia, unspecified: Secondary | ICD-10-CM | POA: Diagnosis not present

## 2015-12-18 DIAGNOSIS — L89894 Pressure ulcer of other site, stage 4: Secondary | ICD-10-CM | POA: Diagnosis not present

## 2015-12-18 DIAGNOSIS — F333 Major depressive disorder, recurrent, severe with psychotic symptoms: Secondary | ICD-10-CM

## 2015-12-18 DIAGNOSIS — G894 Chronic pain syndrome: Secondary | ICD-10-CM | POA: Diagnosis not present

## 2015-12-18 DIAGNOSIS — E43 Unspecified severe protein-calorie malnutrition: Secondary | ICD-10-CM | POA: Diagnosis not present

## 2015-12-18 DIAGNOSIS — Z978 Presence of other specified devices: Secondary | ICD-10-CM
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
## 2015-12-20 ENCOUNTER — Non-Acute Institutional Stay (SKILLED_NURSING_FACILITY): Payer: Medicaid Other | Admitting: Adult Health

## 2015-12-20 ENCOUNTER — Encounter: Payer: Self-pay | Admitting: Adult Health

## 2015-12-20 DIAGNOSIS — Z5329 Procedure and treatment not carried out because of patient's decision for other reasons: Secondary | ICD-10-CM

## 2015-12-20 DIAGNOSIS — G839 Paralytic syndrome, unspecified: Secondary | ICD-10-CM

## 2015-12-20 DIAGNOSIS — G894 Chronic pain syndrome: Secondary | ICD-10-CM
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
## 2016-01-07 ENCOUNTER — Non-Acute Institutional Stay (SKILLED_NURSING_FACILITY): Payer: Medicaid Other | Admitting: Adult Health

## 2016-01-07 ENCOUNTER — Encounter: Payer: Self-pay | Admitting: Adult Health

## 2016-01-07 DIAGNOSIS — L8990 Pressure ulcer of unspecified site, unspecified stage: Secondary | ICD-10-CM

## 2016-01-07 DIAGNOSIS — G839 Paralytic syndrome, unspecified: Secondary | ICD-10-CM

## 2016-01-07 DIAGNOSIS — R627 Adult failure to thrive: Secondary | ICD-10-CM

## 2016-01-07 DIAGNOSIS — F333 Major depressive disorder, recurrent, severe with psychotic symptoms: Secondary | ICD-10-CM
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
## 2016-01-17 ENCOUNTER — Encounter: Payer: Self-pay | Admitting: Adult Health

## 2016-01-17 ENCOUNTER — Non-Acute Institutional Stay (SKILLED_NURSING_FACILITY): Payer: Medicaid Other | Admitting: Adult Health

## 2016-01-17 DIAGNOSIS — G839 Paralytic syndrome, unspecified: Secondary | ICD-10-CM | POA: Diagnosis not present

## 2016-01-17 DIAGNOSIS — F333 Major depressive disorder, recurrent, severe with psychotic symptoms: Secondary | ICD-10-CM | POA: Diagnosis not present

## 2016-01-17 DIAGNOSIS — R627 Adult failure to thrive: Secondary | ICD-10-CM

## 2016-01-17 DIAGNOSIS — G894 Chronic pain syndrome: Secondary | ICD-10-CM

## 2016-01-17 DIAGNOSIS — F22 Delusional disorders: Secondary | ICD-10-CM | POA: Diagnosis not present
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
## 2016-01-29 ENCOUNTER — Encounter: Payer: Self-pay | Admitting: Adult Health
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
## 2016-03-06 ENCOUNTER — Encounter: Payer: Self-pay | Admitting: Adult Health

## 2016-03-06 ENCOUNTER — Non-Acute Institutional Stay (SKILLED_NURSING_FACILITY): Payer: Medicaid Other | Admitting: Adult Health

## 2016-03-06 DIAGNOSIS — G894 Chronic pain syndrome: Secondary | ICD-10-CM

## 2016-03-06 DIAGNOSIS — E43 Unspecified severe protein-calorie malnutrition: Secondary | ICD-10-CM

## 2016-03-06 DIAGNOSIS — G839 Paralytic syndrome, unspecified: Secondary | ICD-10-CM | POA: Diagnosis not present

## 2016-03-06 DIAGNOSIS — R627 Adult failure to thrive: Secondary | ICD-10-CM

## 2016-03-06 DIAGNOSIS — F333 Major depressive disorder, recurrent, severe with psychotic symptoms: Secondary | ICD-10-CM | POA: Diagnosis not present

## 2016-03-06 DIAGNOSIS — N319 Neuromuscular dysfunction of bladder, unspecified: Secondary | ICD-10-CM

## 2016-03-06 DIAGNOSIS — K5901 Slow transit constipation: Secondary | ICD-10-CM | POA: Diagnosis not present
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
## 2016-03-11 LAB — BASIC METABOLIC PANEL
BUN: 11 mg/dL (ref 4–21)
CREATININE: 0.5 mg/dL — AB (ref 0.6–1.3)
Glucose: 111 mg/dL
POTASSIUM: 4.1 mmol/L (ref 3.4–5.3)
Sodium: 139 mmol/L (ref 137–147)

## 2016-03-18 LAB — BASIC METABOLIC PANEL
BUN: 17 mg/dL (ref 4–21)
CREATININE: 0.5 mg/dL — AB (ref 0.6–1.3)
Glucose: 147 mg/dL
POTASSIUM: 3.8 mmol/L (ref 3.4–5.3)
Sodium: 138 mmol/L (ref 137–147)

## 2016-04-03 ENCOUNTER — Encounter: Payer: Self-pay | Admitting: Adult Health

## 2016-04-03 ENCOUNTER — Non-Acute Institutional Stay (SKILLED_NURSING_FACILITY): Payer: Medicaid Other | Admitting: Adult Health

## 2016-04-03 DIAGNOSIS — L89304 Pressure ulcer of unspecified buttock, stage 4: Secondary | ICD-10-CM

## 2016-04-03 DIAGNOSIS — F333 Major depressive disorder, recurrent, severe with psychotic symptoms: Secondary | ICD-10-CM | POA: Diagnosis not present

## 2016-04-03 DIAGNOSIS — E43 Unspecified severe protein-calorie malnutrition: Secondary | ICD-10-CM | POA: Diagnosis not present

## 2016-04-03 DIAGNOSIS — K5901 Slow transit constipation: Secondary | ICD-10-CM | POA: Diagnosis not present

## 2016-04-03 DIAGNOSIS — G894 Chronic pain syndrome: Secondary | ICD-10-CM

## 2016-04-03 DIAGNOSIS — G839 Paralytic syndrome, unspecified: Secondary | ICD-10-CM | POA: Diagnosis not present

## 2016-04-03 DIAGNOSIS — R627 Adult failure to thrive: Secondary | ICD-10-CM
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
## 2016-04-10 ENCOUNTER — Encounter: Payer: Self-pay | Admitting: Adult Health

## 2016-04-10 ENCOUNTER — Non-Acute Institutional Stay (SKILLED_NURSING_FACILITY): Payer: Medicaid Other | Admitting: Adult Health

## 2016-04-10 DIAGNOSIS — G894 Chronic pain syndrome: Secondary | ICD-10-CM

## 2016-04-10 DIAGNOSIS — G839 Paralytic syndrome, unspecified: Secondary | ICD-10-CM
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
## 2016-04-15 ENCOUNTER — Emergency Department (HOSPITAL_COMMUNITY): Payer: Medicaid Other

## 2016-04-15 ENCOUNTER — Inpatient Hospital Stay (HOSPITAL_COMMUNITY)
Admission: EM | Admit: 2016-04-15 | Discharge: 2016-04-24 | DRG: 698 | Disposition: A | Discharge status: Skilled Nursing Facility | Payer: Medicaid Other | Attending: Internal Medicine | Admitting: Internal Medicine

## 2016-04-15 ENCOUNTER — Encounter (HOSPITAL_COMMUNITY): Payer: Self-pay | Admitting: Emergency Medicine

## 2016-04-15 DIAGNOSIS — F333 Major depressive disorder, recurrent, severe with psychotic symptoms: Secondary | ICD-10-CM | POA: Diagnosis present

## 2016-04-15 DIAGNOSIS — Z79899 Other long term (current) drug therapy: Secondary | ICD-10-CM

## 2016-04-15 DIAGNOSIS — L89310 Pressure ulcer of right buttock, unstageable: Secondary | ICD-10-CM | POA: Diagnosis present

## 2016-04-15 DIAGNOSIS — R509 Fever, unspecified: Secondary | ICD-10-CM

## 2016-04-15 DIAGNOSIS — K219 Gastro-esophageal reflux disease without esophagitis: Secondary | ICD-10-CM | POA: Diagnosis present

## 2016-04-15 DIAGNOSIS — T402X5A Adverse effect of other opioids, initial encounter: Secondary | ICD-10-CM | POA: Diagnosis not present

## 2016-04-15 DIAGNOSIS — L97818 Non-pressure chronic ulcer of other part of right lower leg with other specified severity: Secondary | ICD-10-CM | POA: Diagnosis present

## 2016-04-15 DIAGNOSIS — K5903 Drug induced constipation: Secondary | ICD-10-CM | POA: Diagnosis not present

## 2016-04-15 DIAGNOSIS — G8250 Quadriplegia, unspecified: Secondary | ICD-10-CM | POA: Diagnosis present

## 2016-04-15 DIAGNOSIS — Z9981 Dependence on supplemental oxygen: Secondary | ICD-10-CM

## 2016-04-15 DIAGNOSIS — N319 Neuromuscular dysfunction of bladder, unspecified: Secondary | ICD-10-CM | POA: Diagnosis present

## 2016-04-15 DIAGNOSIS — Z993 Dependence on wheelchair: Secondary | ICD-10-CM

## 2016-04-15 DIAGNOSIS — N390 Urinary tract infection, site not specified: Secondary | ICD-10-CM | POA: Diagnosis present

## 2016-04-15 DIAGNOSIS — A419 Sepsis, unspecified organism: Secondary | ICD-10-CM | POA: Diagnosis present

## 2016-04-15 DIAGNOSIS — M4628 Osteomyelitis of vertebra, sacral and sacrococcygeal region: Secondary | ICD-10-CM | POA: Diagnosis present

## 2016-04-15 DIAGNOSIS — J189 Pneumonia, unspecified organism: Secondary | ICD-10-CM | POA: Diagnosis present

## 2016-04-15 DIAGNOSIS — F419 Anxiety disorder, unspecified: Secondary | ICD-10-CM | POA: Diagnosis present

## 2016-04-15 DIAGNOSIS — E43 Unspecified severe protein-calorie malnutrition: Secondary | ICD-10-CM | POA: Diagnosis present

## 2016-04-15 DIAGNOSIS — E876 Hypokalemia: Secondary | ICD-10-CM | POA: Diagnosis present

## 2016-04-15 DIAGNOSIS — L89894 Pressure ulcer of other site, stage 4: Secondary | ICD-10-CM | POA: Diagnosis present

## 2016-04-15 DIAGNOSIS — Z6823 Body mass index (BMI) 23.0-23.9, adult: Secondary | ICD-10-CM

## 2016-04-15 DIAGNOSIS — S31000A Unspecified open wound of lower back and pelvis without penetration into retroperitoneum, initial encounter: Secondary | ICD-10-CM

## 2016-04-15 DIAGNOSIS — L89150 Pressure ulcer of sacral region, unstageable: Secondary | ICD-10-CM | POA: Diagnosis present

## 2016-04-15 DIAGNOSIS — Z8611 Personal history of tuberculosis: Secondary | ICD-10-CM

## 2016-04-15 DIAGNOSIS — Z833 Family history of diabetes mellitus: Secondary | ICD-10-CM

## 2016-04-15 DIAGNOSIS — L89159 Pressure ulcer of sacral region, unspecified stage: Secondary | ICD-10-CM

## 2016-04-15 DIAGNOSIS — R140 Abdominal distension (gaseous): Secondary | ICD-10-CM

## 2016-04-15 DIAGNOSIS — Y95 Nosocomial condition: Secondary | ICD-10-CM | POA: Diagnosis present

## 2016-04-15 DIAGNOSIS — L89220 Pressure ulcer of left hip, unstageable: Secondary | ICD-10-CM | POA: Diagnosis present

## 2016-04-15 DIAGNOSIS — R319 Hematuria, unspecified: Secondary | ICD-10-CM

## 2016-04-15 DIAGNOSIS — T83518A Infection and inflammatory reaction due to other urinary catheter, initial encounter: Principal | ICD-10-CM | POA: Diagnosis present

## 2016-04-15 DIAGNOSIS — L8990 Pressure ulcer of unspecified site, unspecified stage: Secondary | ICD-10-CM | POA: Diagnosis present

## 2016-04-15 DIAGNOSIS — L89320 Pressure ulcer of left buttock, unstageable: Secondary | ICD-10-CM | POA: Diagnosis present

## 2016-04-15 DIAGNOSIS — Z86718 Personal history of other venous thrombosis and embolism: Secondary | ICD-10-CM

## 2016-04-15 DIAGNOSIS — Z8249 Family history of ischemic heart disease and other diseases of the circulatory system: Secondary | ICD-10-CM

## 2016-04-15 DIAGNOSIS — G8220 Paraplegia, unspecified: Secondary | ICD-10-CM | POA: Diagnosis present

## 2016-04-15 DIAGNOSIS — Z87440 Personal history of urinary (tract) infections: Secondary | ICD-10-CM

## 2016-04-15 DIAGNOSIS — Z87891 Personal history of nicotine dependence: Secondary | ICD-10-CM

## 2016-04-15 DIAGNOSIS — Z79891 Long term (current) use of opiate analgesic: Secondary | ICD-10-CM

## 2016-04-15 LAB — CBC WITH DIFFERENTIAL/PLATELET
Basophils Absolute: 0 10*3/uL (ref 0.0–0.1)
Basophils Relative: 0 %
EOS PCT: 2 %
Eosinophils Absolute: 0.2 10*3/uL (ref 0.0–0.7)
HCT: 36.1 % — ABNORMAL LOW (ref 39.0–52.0)
Hemoglobin: 11.7 g/dL — ABNORMAL LOW (ref 13.0–17.0)
LYMPHS ABS: 1.3 10*3/uL (ref 0.7–4.0)
LYMPHS PCT: 13 %
MCH: 27.1 pg (ref 26.0–34.0)
MCHC: 32.4 g/dL (ref 30.0–36.0)
MCV: 83.8 fL (ref 78.0–100.0)
MONO ABS: 1 10*3/uL (ref 0.1–1.0)
MONOS PCT: 10 %
Neutro Abs: 7.5 10*3/uL (ref 1.7–7.7)
Neutrophils Relative %: 75 %
Platelets: 421 10*3/uL — ABNORMAL HIGH (ref 150–400)
RBC: 4.31 MIL/uL (ref 4.22–5.81)
RDW: 15.7 % — AB (ref 11.5–15.5)
WBC: 10 10*3/uL (ref 4.0–10.5)

## 2016-04-15 LAB — COMPREHENSIVE METABOLIC PANEL
ALBUMIN: 2.8 g/dL — AB (ref 3.5–5.0)
ALT: 14 U/L — ABNORMAL LOW (ref 17–63)
AST: 18 U/L (ref 15–41)
Alkaline Phosphatase: 81 U/L (ref 38–126)
Anion gap: 10 (ref 5–15)
BILIRUBIN TOTAL: 0.5 mg/dL (ref 0.3–1.2)
BUN: 17 mg/dL (ref 6–20)
CHLORIDE: 102 mmol/L (ref 101–111)
CO2: 27 mmol/L (ref 22–32)
Calcium: 10.2 mg/dL (ref 8.9–10.3)
Creatinine, Ser: 0.65 mg/dL (ref 0.61–1.24)
GFR calc Af Amer: >60 mL/min (ref >60)
GFR calc non Af Amer: >60 mL/min (ref >60)
GLUCOSE: 127 mg/dL — AB (ref 65–99)
POTASSIUM: 3.6 mmol/L (ref 3.5–5.1)
Sodium: 139 mmol/L (ref 135–145)
TOTAL PROTEIN: 8.5 g/dL — AB (ref 6.5–8.1)

## 2016-04-15 LAB — I-STAT CG4 LACTIC ACID, ED: Lactic Acid, Venous: 1.57 mmol/L (ref 0.5–1.9)

## 2016-04-15 MED ORDER — SODIUM CHLORIDE 0.9 % IV BOLUS (SEPSIS)
1000.0000 mL | Freq: Once | INTRAVENOUS | Status: AC
  Administered 2016-04-15: 1000 mL via INTRAVENOUS

## 2016-04-15 MED ORDER — MORPHINE SULFATE (PF) 4 MG/ML IV SOLN
4.0000 mg | Freq: Once | INTRAVENOUS | Status: AC
  Administered 2016-04-15: 4 mg via INTRAVENOUS
  Filled 2016-04-15: qty 1
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
## 2016-04-15 NOTE — ED Notes (Signed)
IV team RN at the bedside.

## 2016-04-16 ENCOUNTER — Emergency Department (HOSPITAL_COMMUNITY): Payer: Medicaid Other

## 2016-04-16 ENCOUNTER — Encounter (HOSPITAL_COMMUNITY): Payer: Self-pay | Admitting: Radiology

## 2016-04-16 DIAGNOSIS — L89150 Pressure ulcer of sacral region, unstageable: Secondary | ICD-10-CM | POA: Diagnosis present

## 2016-04-16 DIAGNOSIS — F333 Major depressive disorder, recurrent, severe with psychotic symptoms: Secondary | ICD-10-CM | POA: Diagnosis present

## 2016-04-16 DIAGNOSIS — Z86718 Personal history of other venous thrombosis and embolism: Secondary | ICD-10-CM | POA: Diagnosis not present

## 2016-04-16 DIAGNOSIS — G8220 Paraplegia, unspecified: Secondary | ICD-10-CM | POA: Diagnosis not present

## 2016-04-16 DIAGNOSIS — F419 Anxiety disorder, unspecified: Secondary | ICD-10-CM | POA: Diagnosis present

## 2016-04-16 DIAGNOSIS — Z8249 Family history of ischemic heart disease and other diseases of the circulatory system: Secondary | ICD-10-CM | POA: Diagnosis not present

## 2016-04-16 DIAGNOSIS — Z803 Family history of malignant neoplasm of breast: Secondary | ICD-10-CM | POA: Diagnosis not present

## 2016-04-16 DIAGNOSIS — A419 Sepsis, unspecified organism: Secondary | ICD-10-CM | POA: Diagnosis present

## 2016-04-16 DIAGNOSIS — L97818 Non-pressure chronic ulcer of other part of right lower leg with other specified severity: Secondary | ICD-10-CM | POA: Diagnosis present

## 2016-04-16 DIAGNOSIS — L89320 Pressure ulcer of left buttock, unstageable: Secondary | ICD-10-CM | POA: Diagnosis present

## 2016-04-16 DIAGNOSIS — J189 Pneumonia, unspecified organism: Secondary | ICD-10-CM | POA: Diagnosis present

## 2016-04-16 DIAGNOSIS — L89310 Pressure ulcer of right buttock, unstageable: Secondary | ICD-10-CM | POA: Diagnosis present

## 2016-04-16 DIAGNOSIS — Z6823 Body mass index (BMI) 23.0-23.9, adult: Secondary | ICD-10-CM | POA: Diagnosis not present

## 2016-04-16 DIAGNOSIS — R52 Pain, unspecified: Secondary | ICD-10-CM | POA: Diagnosis present

## 2016-04-16 DIAGNOSIS — J181 Lobar pneumonia, unspecified organism: Secondary | ICD-10-CM | POA: Diagnosis not present

## 2016-04-16 DIAGNOSIS — N319 Neuromuscular dysfunction of bladder, unspecified: Secondary | ICD-10-CM | POA: Diagnosis present

## 2016-04-16 DIAGNOSIS — Z993 Dependence on wheelchair: Secondary | ICD-10-CM | POA: Diagnosis not present

## 2016-04-16 DIAGNOSIS — Y95 Nosocomial condition: Secondary | ICD-10-CM | POA: Diagnosis present

## 2016-04-16 DIAGNOSIS — T83518A Infection and inflammatory reaction due to other urinary catheter, initial encounter: Secondary | ICD-10-CM | POA: Diagnosis present

## 2016-04-16 DIAGNOSIS — M4628 Osteomyelitis of vertebra, sacral and sacrococcygeal region: Secondary | ICD-10-CM | POA: Diagnosis present

## 2016-04-16 DIAGNOSIS — Z833 Family history of diabetes mellitus: Secondary | ICD-10-CM | POA: Diagnosis not present

## 2016-04-16 DIAGNOSIS — T402X5A Adverse effect of other opioids, initial encounter: Secondary | ICD-10-CM | POA: Diagnosis not present

## 2016-04-16 DIAGNOSIS — L89894 Pressure ulcer of other site, stage 4: Secondary | ICD-10-CM | POA: Diagnosis present

## 2016-04-16 DIAGNOSIS — M86172 Other acute osteomyelitis, left ankle and foot: Secondary | ICD-10-CM | POA: Diagnosis not present

## 2016-04-16 DIAGNOSIS — N390 Urinary tract infection, site not specified: Secondary | ICD-10-CM

## 2016-04-16 DIAGNOSIS — L853 Xerosis cutis: Secondary | ICD-10-CM | POA: Diagnosis not present

## 2016-04-16 DIAGNOSIS — B9689 Other specified bacterial agents as the cause of diseases classified elsewhere: Secondary | ICD-10-CM | POA: Diagnosis not present

## 2016-04-16 DIAGNOSIS — K5903 Drug induced constipation: Secondary | ICD-10-CM | POA: Diagnosis not present

## 2016-04-16 DIAGNOSIS — E43 Unspecified severe protein-calorie malnutrition: Secondary | ICD-10-CM | POA: Diagnosis present

## 2016-04-16 DIAGNOSIS — G8250 Quadriplegia, unspecified: Secondary | ICD-10-CM | POA: Diagnosis present

## 2016-04-16 DIAGNOSIS — K219 Gastro-esophageal reflux disease without esophagitis: Secondary | ICD-10-CM | POA: Diagnosis present

## 2016-04-16 DIAGNOSIS — G839 Paralytic syndrome, unspecified: Secondary | ICD-10-CM | POA: Diagnosis not present

## 2016-04-16 DIAGNOSIS — Z8611 Personal history of tuberculosis: Secondary | ICD-10-CM | POA: Diagnosis not present

## 2016-04-16 DIAGNOSIS — E876 Hypokalemia: Secondary | ICD-10-CM | POA: Diagnosis present

## 2016-04-16 DIAGNOSIS — L89220 Pressure ulcer of left hip, unstageable: Secondary | ICD-10-CM | POA: Diagnosis present

## 2016-04-16 LAB — URINE MICROSCOPIC-ADD ON

## 2016-04-16 LAB — CBC
HEMATOCRIT: 33.7 % — AB (ref 39.0–52.0)
Hemoglobin: 10.9 g/dL — ABNORMAL LOW (ref 13.0–17.0)
MCH: 27.2 pg (ref 26.0–34.0)
MCHC: 32.3 g/dL (ref 30.0–36.0)
MCV: 84 fL (ref 78.0–100.0)
PLATELETS: 358 10*3/uL (ref 150–400)
RBC: 4.01 MIL/uL — AB (ref 4.22–5.81)
RDW: 15.6 % — AB (ref 11.5–15.5)
WBC: 10 10*3/uL (ref 4.0–10.5)

## 2016-04-16 LAB — BASIC METABOLIC PANEL
ANION GAP: 12 (ref 5–15)
BUN: 15 mg/dL (ref 6–20)
CALCIUM: 9.5 mg/dL (ref 8.9–10.3)
CHLORIDE: 100 mmol/L — AB (ref 101–111)
CO2: 25 mmol/L (ref 22–32)
Creatinine, Ser: 0.57 mg/dL — ABNORMAL LOW (ref 0.61–1.24)
GFR calc non Af Amer: >60 mL/min (ref >60)
GLUCOSE: 172 mg/dL — AB (ref 65–99)
Potassium: 3.1 mmol/L — ABNORMAL LOW (ref 3.5–5.1)
Sodium: 137 mmol/L (ref 135–145)

## 2016-04-16 LAB — URINALYSIS, ROUTINE W REFLEX MICROSCOPIC
BILIRUBIN URINE: NEGATIVE
GLUCOSE, UA: NEGATIVE mg/dL
Ketones, ur: 15 mg/dL — AB
NITRITE: POSITIVE — AB
Specific Gravity, Urine: 1.031 — ABNORMAL HIGH (ref 1.005–1.030)
pH: 5.5 (ref 5.0–8.0)

## 2016-04-16 LAB — MRSA PCR SCREENING: MRSA by PCR: POSITIVE — AB

## 2016-04-16 MED ORDER — DEXTROSE 5 % IV SOLN
500.0000 mg | INTRAVENOUS | Status: DC
  Administered 2016-04-17 – 2016-04-18 (×2): 500 mg via INTRAVENOUS
  Filled 2016-04-16 (×2): qty 500

## 2016-04-16 MED ORDER — OXYCODONE HCL 5 MG PO TABS
5.0000 mg | ORAL_TABLET | ORAL | Status: DC | PRN
  Filled 2016-04-16 (×2): qty 1

## 2016-04-16 MED ORDER — SODIUM CHLORIDE 0.9 % IV SOLN
INTRAVENOUS | Status: DC
  Administered 2016-04-16: 16:00:00 via INTRAVENOUS

## 2016-04-16 MED ORDER — PHENAZOPYRIDINE HCL 100 MG PO TABS
100.0000 mg | ORAL_TABLET | Freq: Three times a day (TID) | ORAL | Status: DC | PRN
  Administered 2016-04-18 – 2016-04-21 (×4): 100 mg via ORAL
  Filled 2016-04-16 (×7): qty 1

## 2016-04-16 MED ORDER — BACLOFEN 5 MG HALF TABLET
20.0000 mg | ORAL_TABLET | Freq: Four times a day (QID) | ORAL | Status: DC
  Filled 2016-04-16: qty 4

## 2016-04-16 MED ORDER — BACLOFEN 20 MG PO TABS
20.0000 mg | ORAL_TABLET | Freq: Four times a day (QID) | ORAL | Status: DC
  Administered 2016-04-16 – 2016-04-24 (×34): 20 mg via ORAL
  Filled 2016-04-16 (×11): qty 1
  Filled 2016-04-16 (×2): qty 2
  Filled 2016-04-16 (×22): qty 1

## 2016-04-16 MED ORDER — MUPIROCIN 2 % EX OINT
1.0000 "application " | TOPICAL_OINTMENT | Freq: Two times a day (BID) | CUTANEOUS | Status: AC
  Administered 2016-04-16 – 2016-04-20 (×9): 1 via NASAL
  Filled 2016-04-16 (×4): qty 22

## 2016-04-16 MED ORDER — ONDANSETRON HCL 4 MG/2ML IJ SOLN: 4.0000 mg | INTRAMUSCULAR | Status: DC

## 2016-04-16 MED ORDER — DEXTROSE 5 % IV SOLN
1.0000 g | INTRAVENOUS | Status: DC
  Administered 2016-04-16 – 2016-04-20 (×5): 1 g via INTRAVENOUS
  Filled 2016-04-16 (×5): qty 10

## 2016-04-16 MED ORDER — ONDANSETRON HCL 4 MG/2ML IJ SOLN: 4.0000 mg | Freq: Four times a day (QID) | INTRAMUSCULAR | Status: DC | PRN

## 2016-04-16 MED ORDER — COLLAGENASE 250 UNIT/GM EX OINT
TOPICAL_OINTMENT | Freq: Every day | CUTANEOUS | Status: DC
  Administered 2016-04-17 – 2016-04-24 (×7): via TOPICAL
  Filled 2016-04-16 (×2): qty 30

## 2016-04-16 MED ORDER — CHLORHEXIDINE GLUCONATE CLOTH 2 % EX PADS
6.0000 | MEDICATED_PAD | Freq: Every day | CUTANEOUS | Status: DC
  Administered 2016-04-19 – 2016-04-20 (×2): 6 via TOPICAL

## 2016-04-16 MED ORDER — OXYCODONE HCL 5 MG PO TABS
5.0000 mg | ORAL_TABLET | ORAL | Status: DC | PRN
  Administered 2016-04-16 (×2): 10 mg via ORAL
  Filled 2016-04-16: qty 1
  Filled 2016-04-16: qty 2

## 2016-04-16 MED ORDER — DEXTROSE 5 % IV SOLN
1.0000 g | Freq: Once | INTRAVENOUS | Status: AC
  Administered 2016-04-16: 1 g via INTRAVENOUS
  Filled 2016-04-16: qty 10

## 2016-04-16 MED ORDER — OXYCODONE HCL 5 MG PO TABS
5.0000 mg | ORAL_TABLET | Freq: Four times a day (QID) | ORAL | Status: DC | PRN
  Administered 2016-04-16: 5 mg via ORAL
  Administered 2016-04-17 (×3): 10 mg via ORAL
  Administered 2016-04-17: 5 mg via ORAL
  Administered 2016-04-18 – 2016-04-19 (×4): 10 mg via ORAL
  Administered 2016-04-19: 5 mg via ORAL
  Administered 2016-04-20 – 2016-04-21 (×4): 10 mg via ORAL
  Administered 2016-04-21: 5 mg via ORAL
  Administered 2016-04-22 – 2016-04-24 (×5): 10 mg via ORAL
  Filled 2016-04-16 (×7): qty 2
  Filled 2016-04-16: qty 1
  Filled 2016-04-16 (×3): qty 2
  Filled 2016-04-16: qty 1
  Filled 2016-04-16 (×8): qty 2

## 2016-04-16 MED ORDER — DEXTROSE 5 % IV SOLN
500.0000 mg | Freq: Once | INTRAVENOUS | Status: AC
  Administered 2016-04-16: 500 mg via INTRAVENOUS

## 2016-04-16 MED ORDER — ALBUTEROL SULFATE (2.5 MG/3ML) 0.083% IN NEBU
2.5000 mg | INHALATION_SOLUTION | RESPIRATORY_TRACT | Status: DC | PRN
  Administered 2016-04-17: 2.5 mg via RESPIRATORY_TRACT
  Filled 2016-04-16: qty 3

## 2016-04-16 MED ORDER — DOCUSATE SODIUM 100 MG PO CAPS
100.0000 mg | ORAL_CAPSULE | Freq: Two times a day (BID) | ORAL | Status: DC
  Administered 2016-04-16 – 2016-04-23 (×13): 100 mg via ORAL
  Filled 2016-04-16 (×16): qty 1

## 2016-04-16 MED ORDER — LORAZEPAM 1 MG PO TABS
1.0000 mg | ORAL_TABLET | ORAL | Status: DC | PRN
  Filled 2016-04-16 (×2): qty 1

## 2016-04-16 MED ORDER — OXYCODONE HCL 5 MG PO TABS: 10.0000 mg | ORAL_TABLET | ORAL | Status: DC | PRN

## 2016-04-16 MED ORDER — GABAPENTIN 300 MG PO CAPS
300.0000 mg | ORAL_CAPSULE | Freq: Four times a day (QID) | ORAL | Status: DC
  Administered 2016-04-16 – 2016-04-24 (×34): 300 mg via ORAL
  Filled 2016-04-16 (×35): qty 1

## 2016-04-16 MED ORDER — IOPAMIDOL (ISOVUE-370) INJECTION 76%
INTRAVENOUS | Status: AC
  Administered 2016-04-16: 100 mL
  Filled 2016-04-16: qty 100

## 2016-04-16 MED ORDER — POLYETHYLENE GLYCOL 3350 17 G PO PACK
17.0000 g | PACK | Freq: Every day | ORAL | Status: DC
  Administered 2016-04-19 – 2016-04-24 (×6): 17 g via ORAL
  Filled 2016-04-16 (×8): qty 1

## 2016-04-16 MED ORDER — PRO-STAT SUGAR FREE PO LIQD
30.0000 mL | Freq: Three times a day (TID) | ORAL | Status: DC
  Administered 2016-04-17 – 2016-04-20 (×4): 30 mL via ORAL
  Filled 2016-04-16 (×9): qty 30

## 2016-04-16 MED ORDER — ENSURE ENLIVE PO LIQD
237.0000 mL | Freq: Two times a day (BID) | ORAL | Status: DC
  Administered 2016-04-17: 237 mL via ORAL

## 2016-04-16 MED ORDER — OXYCODONE HCL 5 MG PO TABS
5.0000 mg | ORAL_TABLET | Freq: Four times a day (QID) | ORAL | Status: DC
  Administered 2016-04-16 – 2016-04-24 (×34): 5 mg via ORAL
  Filled 2016-04-16 (×34): qty 1

## 2016-04-16 MED ORDER — ACETAMINOPHEN 650 MG RE SUPP: 650.0000 mg | Freq: Four times a day (QID) | RECTAL | Status: DC | PRN

## 2016-04-16 MED ORDER — ACETAMINOPHEN 325 MG PO TABS
650.0000 mg | ORAL_TABLET | Freq: Four times a day (QID) | ORAL | Status: DC | PRN
  Administered 2016-04-16 – 2016-04-17 (×2): 650 mg via ORAL
  Filled 2016-04-16 (×2): qty 2

## 2016-04-16 MED ORDER — ENOXAPARIN SODIUM 40 MG/0.4ML ~~LOC~~ SOLN
40.0000 mg | SUBCUTANEOUS | Status: DC
  Administered 2016-04-16 – 2016-04-23 (×8): 40 mg via SUBCUTANEOUS
  Filled 2016-04-16 (×8): qty 0.4

## 2016-04-16 MED ORDER — ONDANSETRON HCL 4 MG PO TABS: 4.0000 mg | ORAL_TABLET | Freq: Four times a day (QID) | ORAL | Status: DC | PRN

## 2016-04-16 NOTE — Progress Notes (Signed)
Pt admitted after midnight, please see earlier admission note by Dr. Smith. Pt came for evaluation of the sacral wound as he has reported he thinks he has maggots in the wound. In addition, he met Sepsis criteria and was started on broad spectrum ABX to cover for presumptive PNA and UTI.   MAGICK-, , MD  [name] Pager [phone]  If 7PM-7AM, please contact night-coverage [email or web address] Password [email or web address]  

## 2016-04-16 NOTE — Consult Note (Addendum)
[facility] Nurse wound consult note Reason for Consult: Consult requested for multiple wounds.  Pt states he recently was told there were maggots in his wounds at another facility, but none were noted at this time.  Pt is familiar to [facility] team from previous admissions; bilat legs are contracted and patient is difficult to reposition.   Wound type: Sacrum with unstageable pressure injury; 3X2.5X.2cm; 95% dark reddish-black, 5% yellow, small amt yellow drainage, no odor or fluctuance. Right buttock with unstageable pressure injury; 3X5cm, 80% slough, 10% yellow, 10% red, small amt yellow drainage, no odor or fluctuance Left buttock with unstageable pressure injury; 1X1cm, 95% yellow, 10% black, small amt yellow drainage, no odor. Left ischium with unstageable pressure injury; 3.2X2X2cm with bone palpable with swab, mod amt yellow drainage, no odor Left foot with 2 areas of full thickness wounds; affected area is 3X1X.2cm with 3 separate areas of red moist wounds, scant amt pink drainage, no odor. Right anterior knee with full thickness wound; 4X3X.1cm, red and moist with small amt pink drainage.  Right posterior leg with ful thickness wound; 7X5cm, 80% slough/eschar, 20% red, small amt yellow drainage, no odor Pressure Ulcer POA: Yes  Dressing procedure/placement/frequency: Air mattress to reduce pressure to affected areas.  Float heels to reduce pressure.  Santyl for enzymatic debridement of nonviable tissue, PT to begin hydrotherapy to assist with removal of nonviable tissue to right posterior leg, left ischium, bilat buttocks,and sacrum.  Foam dressing to promote healing to left foot and right knee.  Nutrition consult to optimize protein intake and promote wound healing.  If sepsis is a concern, then consider MRI to r/o osteomyelitis. Discussed plan of care with patient and he verbalized understanding. One hour spent performing this consult. Please re-consult if further assistance is needed.  Thank-you,  Julien Girt MSN, [place], [place], [place], [place]

## 2016-04-16 NOTE — Progress Notes (Signed)
Attempt to contact [facility] in regards to patient's foley catheter, but Pryor Curia was unable to get anyone on the phone.
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
## 2016-04-16 NOTE — Progress Notes (Signed)
According to nurse at [facility] the patient's foley was placed 01/30/16 at the facility.

## 2016-04-16 NOTE — Progress Notes (Signed)
Report received from ER RN . Room ready.  

## 2016-04-16 NOTE — Progress Notes (Signed)
New Admission Note:  Arrival Method: Stretcher Mental Orientation: Alert and oriented x 4 Telemetry: Box 04 ST Assessment: Completed Skin: Multiple ulceration to buttocks legs/foot  IV: NSLs Pain: 8/10  Tubes: Foley  Safety Measures: Safety Fall Prevention Plan was given, discussed and signed. Admission: Completed [address] [place] Orientation: Patient has been orientated to the room, unit and the staff. Family: None  Orders have been reviewed and implemented. Will continue to monitor the patient. Call light has been placed within reach and bed alarm has been activated.   Sima Matas BSN, RN  Phone Number: [phone]

## 2016-04-16 NOTE — Progress Notes (Signed)
Pharmacy Antibiotic Note  Joshua Park is a 45 y.o. male admitted on 04/15/2016 with CAP.  Pharmacy has been consulted for Rocephin and azithromycin dosing.  Plan: Rocephin 1g IV Q24H. Azithromycin 500mg  IV Q24H.  Temp (24hrs), Avg:98.5 F (36.9 C), Min:98.5 F (36.9 C), Max:98.5 F (36.9 C)   Recent Labs Lab 04/15/16 2252 04/15/16 2302  WBC 10.0  --   CREATININE 0.65  --   LATICACIDVEN  --  1.57      No Known Allergies   Thank you for allowing pharmacy to be a part of this patient's care.  Wynona Neat, PharmD, BCPS  04/16/2016 4:55 AM

## 2016-04-16 NOTE — Progress Notes (Addendum)
Dr. Doyle Askew paged for clarification on the patients prn pain medication order. Patient also questioning dsyphagia diet. Patient arrived to the unit with a urethral catheter in place. The documentation and report from sending unit unclear in regards to placement date, time, and location.

## 2016-04-17 ENCOUNTER — Encounter (HOSPITAL_COMMUNITY): Payer: Self-pay | Admitting: *Deleted

## 2016-04-17 LAB — URINE CULTURE

## 2016-04-17 MED ORDER — MORPHINE SULFATE (PF) 2 MG/ML IV SOLN
1.0000 mg | Freq: Once | INTRAVENOUS | Status: DC
  Filled 2016-04-17 (×2): qty 1

## 2016-04-17 MED ORDER — POTASSIUM CHLORIDE CRYS ER 20 MEQ PO TBCR
40.0000 meq | EXTENDED_RELEASE_TABLET | Freq: Once | ORAL | Status: AC
  Administered 2016-04-17: 40 meq via ORAL
  Filled 2016-04-17: qty 2

## 2016-04-17 NOTE — Progress Notes (Signed)
Physical Therapy Wound Treatment Patient Details  Name: Joshua Park MRN: [id number] Date of Birth: 1971/03/01  Today's Date: 04/17/2016 Time: 1132-1220 Time Calculation (min): 48 min  Subjective  Subjective: Pt asking if we were going to get his bed wet with the hydrotherapy Patient and Family Stated Goals: not stated Date of Onset:  (Prior to hospitalization) Prior Treatments: Unsure  Pain Score: Pain Score: 2   Wound Assessment  Pressure Injury 04/15/16 Unstageable - Full thickness tissue loss in which the base of the ulcer is covered by slough (yellow, tan, gray, green or brown) and/or eschar (tan, brown or black) in the wound bed. yellow eschar  (Active)  Dressing Type Gauze (Comment);Moist to dry;Foam;Barrier Film (skin prep) 04/17/2016  4:47 PM  Dressing Changed;Clean;Dry;Intact 04/17/2016  4:47 PM  Dressing Change Frequency Daily 04/17/2016  4:47 PM  State of Healing Eschar 04/17/2016  4:47 PM  Site / Wound Assessment Yellow 04/17/2016  4:47 PM  % Wound base Red or Granulating 0% 04/17/2016  4:47 PM  % Wound base Yellow 100% 04/17/2016  4:47 PM  % Wound base Black 0% 04/17/2016  4:47 PM  % Wound base Other (Comment) 5% 04/17/2016  4:47 PM  Peri-wound Assessment Intact 04/17/2016  4:47 PM  Wound Length (cm) 1 cm 04/17/2016  4:47 PM  Wound Width (cm) 1.5 cm 04/17/2016  4:47 PM  Wound Depth (cm) 0.1 cm 04/17/2016  4:47 PM  Margins Unattached edges (unapproximated) 04/17/2016  4:47 PM  Drainage Amount Scant 04/17/2016  4:47 PM  Drainage Description Serous 04/17/2016  4:47 PM  Treatment Hydrotherapy (Pulse lavage);Packing (Saline gauze) 04/17/2016  4:47 PM   Santyl applied to wound bed prior to applying dressing.   Pressure Injury 04/15/16 Unstageable - Full thickness tissue loss in which the base of the ulcer is covered by slough (yellow, tan, gray, green or brown) and/or eschar (tan, brown or black) in the wound bed. yellow and off white eschar (Active)  Dressing Type  Gauze (Comment);Barrier Film (skin prep);Foam;Moist to dry 04/17/2016  4:47 PM  Dressing Changed;Clean;Dry;Intact 04/17/2016  4:47 PM  Dressing Change Frequency Daily 04/17/2016  4:47 PM  State of Healing Eschar 04/17/2016  4:47 PM  Site / Wound Assessment Yellow;Red 04/17/2016  4:47 PM  % Wound base Red or Granulating 30% 04/17/2016  4:47 PM  % Wound base Yellow 65% 04/17/2016  4:47 PM  % Wound base Black 5% 04/17/2016  4:47 PM  % Wound base Other (Comment) 0% 04/17/2016  4:47 PM  Peri-wound Assessment Intact 04/17/2016  4:47 PM  Wound Length (cm) 3 cm 04/17/2016  4:47 PM  Wound Width (cm) 5 cm 04/17/2016  4:47 PM  Wound Depth (cm) 0.1 cm 04/17/2016  4:47 PM  Margins Unattached edges (unapproximated) 04/17/2016  4:47 PM  Drainage Amount Minimal 04/17/2016  4:47 PM  Drainage Description Serosanguineous 04/17/2016  4:47 PM  Treatment Hydrotherapy (Pulse lavage);Packing (Saline gauze) 04/17/2016  4:47 PM   Santyl applied to wound bed prior to applying dressing.   Pressure Injury 04/16/16 Unstageable - Full thickness tissue loss in which the base of the ulcer is covered by slough (yellow, tan, gray, green or brown) and/or eschar (tan, brown or black) in the wound bed. eschar (Active)  Dressing Type Foam;Barrier Film (skin prep);Gauze (Comment);Moist to dry 04/17/2016  4:47 PM  Dressing Changed;Clean;Dry;Intact 04/17/2016  4:47 PM  Dressing Change Frequency Daily 04/17/2016  4:47 PM  State of Healing Eschar 04/17/2016  4:47 PM  Site / Wound Assessment Yellow;Red 04/17/2016  4:47 PM  % Wound base Red or Granulating 50% 04/17/2016  4:47 PM  % Wound base Yellow 40% 04/17/2016  4:47 PM  % Wound base Black 10% 04/17/2016  4:47 PM  % Wound base Other (Comment) 0% 04/17/2016  4:47 PM  Peri-wound Assessment Erythema (blanchable) 04/17/2016  4:47 PM  Wound Length (cm) 3.5 cm 04/17/2016  4:47 PM  Wound Width (cm) 2.3 cm 04/17/2016  4:47 PM  Wound Depth (cm) 0.2 cm 04/17/2016  4:47 PM  Margins  Unattached edges (unapproximated) 04/17/2016  4:47 PM  Drainage Amount Minimal 04/17/2016  4:47 PM  Drainage Description Serosanguineous 04/17/2016  4:47 PM  Treatment Hydrotherapy (Pulse lavage);Packing (Saline gauze) 04/17/2016  4:47 PM   Santyl applied to wound bed prior to applying dressing.;  Pressure Injury 04/16/16 Unstageable - Full thickness tissue loss in which the base of the ulcer is covered by slough (yellow, tan, gray, green or brown) and/or eschar (tan, brown or black) in the wound bed. (Active)  Dressing Type Foam;Barrier Film (skin prep);Gauze (Comment);Moist to dry 04/17/2016  4:47 PM  Dressing Changed;Clean;Dry;Intact 04/17/2016  4:47 PM  Dressing Change Frequency Daily 04/17/2016  4:47 PM  State of Healing Eschar 04/17/2016  4:47 PM  Site / Wound Assessment Red;Yellow;Bleeding 04/17/2016  4:47 PM  % Wound base Red or Granulating 40% 04/17/2016  4:47 PM  % Wound base Yellow 60% 04/17/2016  4:47 PM  % Wound base Black 0% 04/17/2016  4:47 PM  % Wound base Other (Comment) 0% 04/17/2016  4:47 PM  Peri-wound Assessment Intact 04/17/2016  4:47 PM  Wound Length (cm) 7 cm 04/17/2016  4:47 PM  Wound Width (cm) 5 cm 04/17/2016  4:47 PM  Wound Depth (cm) 0.1 cm 04/17/2016  4:47 PM  Margins Unattached edges (unapproximated) 04/17/2016  4:47 PM  Drainage Amount Minimal 04/17/2016  4:47 PM  Drainage Description Serosanguineous 04/17/2016  4:47 PM  Treatment Hydrotherapy (Pulse lavage);Packing (Saline gauze) 04/17/2016  4:47 PM   Santyl applied to wound bed prior to applying dressing.   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 (Active)  Dressing Type Foam;Barrier Film (skin prep);Moist to dry;Gauze (Comment) 04/17/2016  4:47 PM  Dressing Changed;Clean;Dry;Intact 04/17/2016  4:47 PM  Dressing Change Frequency Daily 04/17/2016  4:47 PM  State of Healing Eschar 04/17/2016  4:47 PM  Site / Wound Assessment Red;Yellow 04/17/2016   4:47 PM  % Wound base Red or Granulating 20% 04/17/2016  4:47 PM  % Wound base Yellow 80% 04/17/2016  4:47 PM  % Wound base Black 0% 04/17/2016  4:47 PM  % Wound base Other (Comment) 0% 04/17/2016  4:47 PM  Peri-wound Assessment Erythema (blanchable) 04/17/2016  4:47 PM  Wound Length (cm) 4 cm 04/17/2016  4:47 PM  Wound Width (cm) 2 cm 04/17/2016  4:47 PM  Wound Depth (cm) 0.2 cm 04/17/2016  4:47 PM  Margins Unattached edges (unapproximated) 04/17/2016  4:47 PM  Drainage Amount Minimal 04/17/2016  4:47 PM  Drainage Description Serosanguineous 04/17/2016  4:47 PM  Treatment Hydrotherapy (Pulse lavage);Packing (Saline gauze) 04/17/2016  4:47 PM   Santyl applied to wound bed prior to applying dressing.    Hydrotherapy Pulsed lavage therapy - wound location: sacrum, buttocks, rt posterior calf Pulsed Lavage with Suction (psi): 8 psi (4-8) Pulsed Lavage with Suction - Normal Saline Used: 1000 mL Pulsed Lavage Tip: Tip with splash shield   Wound Assessment and Plan  Wound Therapy - Assess/Plan/Recommendations Wound Therapy - Clinical  Statement: Pt presents to hydrotherapy with multiple wounds that can benefit from hydrotherapy to remove necrotic tissue and promote healing Wound Therapy - Functional Problem List: Decr sitting tolerance Factors Delaying/Impairing Wound Healing: Immobility;Multiple medical problems;Altered sensation Hydrotherapy Plan: Debridement;Dressing change;Patient/family education;Pulsatile lavage with suction Wound Therapy - Frequency: 6X / week Wound Therapy - Follow Up Recommendations: Skilled nursing facility Wound Plan: See above  Wound Therapy Goals- Improve the function of patient's integumentary system by progressing the wound(s) through the phases of wound healing (inflammation - proliferation - remodeling) by: Decrease Necrotic Tissue to: 25 (overall) Decrease Necrotic Tissue - Progress: Goal set today Increase Granulation Tissue to: 75 (overall) Increase  Granulation Tissue - Progress: Goal set today  Goals will be updated until maximal potential achieved or discharge criteria met.  Discharge criteria: when goals achieved, discharge from hospital, MD decision/surgical intervention, no progress towards goals, refusal/missing three consecutive treatments without notification or medical reason.  GP     Joshua Park 04/17/2016, 5:24 PM [facility] PT [phone]
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
## 2016-04-17 NOTE — Clinical Social Work Note (Signed)
Patient is a long-term resident from [facility]. CSW confirmed with [facility] [place], [place]. She stated that at SNF, patient refuses care, refuses food, says he wants to die, and cusses at staff daily. They are willing to take him back but want psych to see him. Patient also refuses medications. CSW paged MD about psych eval. Patient will also not be able to go to SNF until airborne precautions dc'ed.  Dayton Scrape, [place]

## 2016-04-18 ENCOUNTER — Inpatient Hospital Stay (HOSPITAL_COMMUNITY): Payer: Medicaid Other

## 2016-04-18 LAB — BASIC METABOLIC PANEL
ANION GAP: 8 (ref 5–15)
BUN: 7 mg/dL (ref 6–20)
CALCIUM: 8.8 mg/dL — AB (ref 8.9–10.3)
CO2: 27 mmol/L (ref 22–32)
Chloride: 102 mmol/L (ref 101–111)
Creatinine, Ser: 0.48 mg/dL — ABNORMAL LOW (ref 0.61–1.24)
GFR calc Af Amer: >60 mL/min (ref >60)
GLUCOSE: 104 mg/dL — AB (ref 65–99)
Potassium: 3.3 mmol/L — ABNORMAL LOW (ref 3.5–5.1)
SODIUM: 137 mmol/L (ref 135–145)

## 2016-04-18 LAB — CBC
HCT: 29.7 % — ABNORMAL LOW (ref 39.0–52.0)
Hemoglobin: 9.4 g/dL — ABNORMAL LOW (ref 13.0–17.0)
MCH: 26.9 pg (ref 26.0–34.0)
MCHC: 31.6 g/dL (ref 30.0–36.0)
MCV: 84.9 fL (ref 78.0–100.0)
PLATELETS: 339 10*3/uL (ref 150–400)
RBC: 3.5 MIL/uL — ABNORMAL LOW (ref 4.22–5.81)
RDW: 15.5 % (ref 11.5–15.5)
WBC: 8.1 10*3/uL (ref 4.0–10.5)

## 2016-04-18 MED ORDER — AZITHROMYCIN 500 MG PO TABS
500.0000 mg | ORAL_TABLET | ORAL | Status: DC
  Administered 2016-04-19 – 2016-04-21 (×3): 500 mg via ORAL
  Filled 2016-04-18 (×3): qty 1

## 2016-04-18 MED ORDER — VANCOMYCIN HCL IN DEXTROSE 1-5 GM/200ML-% IV SOLN
1000.0000 mg | Freq: Three times a day (TID) | INTRAVENOUS | Status: DC
  Filled 2016-04-18 (×2): qty 200

## 2016-04-18 MED ORDER — VANCOMYCIN HCL IN DEXTROSE 750-5 MG/150ML-% IV SOLN
750.0000 mg | Freq: Three times a day (TID) | INTRAVENOUS | Status: DC
  Administered 2016-04-18 – 2016-04-21 (×11): 750 mg via INTRAVENOUS
  Filled 2016-04-18 (×12): qty 150

## 2016-04-18 MED ORDER — POTASSIUM CHLORIDE CRYS ER 20 MEQ PO TBCR
40.0000 meq | EXTENDED_RELEASE_TABLET | Freq: Once | ORAL | Status: AC
  Administered 2016-04-18: 40 meq via ORAL
  Filled 2016-04-18: qty 2

## 2016-04-18 NOTE — Progress Notes (Signed)
1716 placed a call to Dr. Doyle Askew that foley cath is leakinh >Will put ordersin

## 2016-04-18 NOTE — Progress Notes (Signed)
Pt's Foley has been leaking, per pt has had Foley in for a month or more, may need another Foley, on call MD notified will continue to monitor, Thanks Arvella Nigh RN
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
## 2016-04-18 NOTE — Progress Notes (Signed)
Physical Therapy Wound Treatment Patient Details  Name: Joshua Park MRN: [id number] Date of Birth: 01/10/1971  Today's Date: 04/18/2016 Time: 1275-1700 Time Calculation (min): 26 min  Subjective  Subjective: Pt conversing appropriately. Patient and Family Stated Goals: not stated Date of Onset:  (Prior to hospitalization) Prior Treatments: Unsure  Pain Score: Pain Score: 5   Wound Assessment  Pressure Injury 04/15/16 Unstageable - Full thickness tissue loss in which the base of the ulcer is covered by slough (yellow, tan, gray, green or brown) and/or eschar (tan, brown or black) in the wound bed. yellow eschar  (Active)  Dressing Type Foam;Moist to dry;Gauze (Comment);Barrier Film (skin prep) 04/18/2016  2:59 PM  Dressing Changed;Clean;Dry;Intact 04/18/2016  2:59 PM  Dressing Change Frequency Daily 04/18/2016  2:59 PM  State of Healing Eschar 04/18/2016  2:59 PM  Site / Wound Assessment Yellow 04/18/2016  2:59 PM  % Wound base Red or Granulating 5% 04/18/2016  2:59 PM  % Wound base Yellow 95% 04/18/2016  2:59 PM  % Wound base Black 0% 04/18/2016  2:59 PM  % Wound base Other (Comment) 0% 04/18/2016  2:59 PM  Peri-wound Assessment Intact 04/18/2016  2:59 PM  Wound Length (cm) 1 cm 04/17/2016  4:47 PM  Wound Width (cm) 1.5 cm 04/17/2016  4:47 PM  Wound Depth (cm) 0.1 cm 04/17/2016  4:47 PM  Margins Unattached edges (unapproximated) 04/18/2016  2:59 PM  Drainage Amount Scant 04/18/2016  2:59 PM  Drainage Description Serous 04/18/2016  2:59 PM  Treatment Hydrotherapy (Pulse lavage);Packing (Saline gauze) 04/18/2016  2:59 PM   Santyl applied to wound bed prior to applying dressing.   Pressure Injury 04/15/16 Unstageable - Full thickness tissue loss in which the base of the ulcer is covered by slough (yellow, tan, gray, green or brown) and/or eschar (tan, brown or black) in the wound bed. yellow and off white eschar (Active)  Dressing Type Gauze (Comment);Barrier Film (skin  prep);Foam;Moist to dry 04/18/2016  2:59 PM  Dressing Changed;Clean;Dry;Intact 04/18/2016  2:59 PM  Dressing Change Frequency Daily 04/18/2016  2:59 PM  State of Healing Eschar 04/18/2016  2:59 PM  Site / Wound Assessment Yellow;Red 04/18/2016  2:59 PM  % Wound base Red or Granulating 35% 04/18/2016  2:59 PM  % Wound base Yellow 60% 04/18/2016  2:59 PM  % Wound base Black 5% 04/18/2016  2:59 PM  % Wound base Other (Comment) 0% 04/18/2016  2:59 PM  Peri-wound Assessment Intact 04/18/2016  2:59 PM  Wound Length (cm) 3 cm 04/17/2016  4:47 PM  Wound Width (cm) 5 cm 04/17/2016  4:47 PM  Wound Depth (cm) 0.1 cm 04/17/2016  4:47 PM  Margins Unattached edges (unapproximated) 04/18/2016  2:59 PM  Drainage Amount Minimal 04/18/2016  2:59 PM  Drainage Description Serosanguineous 04/18/2016  2:59 PM  Treatment Hydrotherapy (Pulse lavage);Packing (Saline gauze) 04/18/2016  2:59 PM   Santyl applied to wound bed prior to applying dressing.   Pressure Injury 04/16/16 Unstageable - Full thickness tissue loss in which the base of the ulcer is covered by slough (yellow, tan, gray, green or brown) and/or eschar (tan, brown or black) in the wound bed. eschar (Active)  Dressing Type Foam;Barrier Film (skin prep);Gauze (Comment);Moist to dry 04/18/2016  2:59 PM  Dressing Changed;Clean;Dry;Intact 04/18/2016  2:59 PM  Dressing Change Frequency Daily 04/18/2016  2:59 PM  State of Healing Eschar 04/18/2016  2:59 PM  Site / Wound Assessment Yellow;Red 04/18/2016  2:59 PM  % Wound base Red or Granulating 75% 04/18/2016  2:59 PM  % Wound base Yellow 25% 04/18/2016  2:59 PM  % Wound base Black 0% 04/18/2016  2:59 PM  % Wound base Other (Comment) 0% 04/18/2016  2:59 PM  Peri-wound Assessment Erythema (blanchable) 04/18/2016  2:59 PM  Wound Length (cm) 3.5 cm 04/17/2016  4:47 PM  Wound Width (cm) 2.3 cm 04/17/2016  4:47 PM  Wound Depth (cm) 0.2 cm 04/17/2016  4:47 PM  Margins Unattached edges (unapproximated)  04/18/2016  2:59 PM  Drainage Amount Minimal 04/18/2016  2:59 PM  Drainage Description Serosanguineous 04/18/2016  2:59 PM  Treatment Debridement (Selective);Packing (Saline gauze);Hydrotherapy (Pulse lavage) 04/18/2016  2:59 PM   Santyl applied to wound bed prior to applying dressing.   Pressure Injury 04/16/16 Unstageable - Full thickness tissue loss in which the base of the ulcer is covered by slough (yellow, tan, gray, green or brown) and/or eschar (tan, brown or black) in the wound bed. (Active)  Dressing Type Foam;Barrier Film (skin prep);Gauze (Comment);Moist to dry 04/18/2016  2:59 PM  Dressing Changed;Clean;Dry;Intact 04/18/2016  2:59 PM  Dressing Change Frequency Daily 04/18/2016  2:59 PM  State of Healing Eschar 04/18/2016  2:59 PM  Site / Wound Assessment Red;Yellow;Bleeding 04/18/2016  2:59 PM  % Wound base Red or Granulating 40% 04/18/2016  2:59 PM  % Wound base Yellow 50% 04/18/2016  2:59 PM  % Wound base Black 10% 04/18/2016  2:59 PM  % Wound base Other (Comment) 0% 04/18/2016  2:59 PM  Peri-wound Assessment Intact 04/18/2016  2:59 PM  Wound Length (cm) 7 cm 04/17/2016  4:47 PM  Wound Width (cm) 5 cm 04/17/2016  4:47 PM  Wound Depth (cm) 0.1 cm 04/17/2016  4:47 PM  Margins Unattached edges (unapproximated) 04/18/2016  2:59 PM  Drainage Amount Minimal 04/18/2016  2:59 PM  Drainage Description Serosanguineous 04/18/2016  2:59 PM  Treatment Hydrotherapy (Pulse lavage);Packing (Saline gauze) 04/18/2016  2:59 PM   Santyl applied to wound bed prior to applying dressing.   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 (Active)  Dressing Type Foam;Barrier Film (skin prep);Moist to dry;Gauze (Comment) 04/18/2016  2:59 PM  Dressing Changed;Clean;Dry;Intact 04/18/2016  2:59 PM  Dressing Change Frequency Daily 04/18/2016  2:59 PM  State of Healing Eschar 04/18/2016  2:59 PM  Site / Wound Assessment Red;Yellow 04/18/2016  2:59 PM   % Wound base Red or Granulating 90% 04/18/2016  2:59 PM  % Wound base Yellow 10% 04/18/2016  2:59 PM  % Wound base Black 0% 04/18/2016  2:59 PM  % Wound base Other (Comment) 0% 04/18/2016  2:59 PM  Peri-wound Assessment Erythema (blanchable) 04/18/2016  2:59 PM  Wound Length (cm) 4 cm 04/17/2016  4:47 PM  Wound Width (cm) 2 cm 04/17/2016  4:47 PM  Wound Depth (cm) 0.2 cm 04/17/2016  4:47 PM  Margins Unattached edges (unapproximated) 04/18/2016  2:59 PM  Drainage Amount Minimal 04/18/2016  2:59 PM  Drainage Description Serosanguineous 04/18/2016  2:59 PM  Treatment Debridement (Selective);Hydrotherapy (Pulse lavage);Packing (Saline gauze) 04/18/2016  2:59 PM   Santyl applied to wound bed prior to applying dressing.    Hydrotherapy Pulsed lavage therapy - wound location: sacrum, buttocks, rt posterior calf Pulsed Lavage with Suction (psi): 8 psi (4-8) Pulsed Lavage with Suction - Normal Saline Used: 1000 mL Pulsed Lavage Tip: Tip with splash shield Selective Debridement Selective Debridement - Location: lt ischial tuberosity and coccyx Selective Debridement - Tools Used: Forceps;Scissors Selective Debridement - Tissue Removed: yellow necrotic  Wound Assessment and Plan  Wound Therapy - Assess/Plan/Recommendations Wound Therapy - Clinical Statement: Wounds progressing well with removal of necrotic tissue.  Wound Therapy - Functional Problem List: Decr sitting tolerance Factors Delaying/Impairing Wound Healing: Immobility;Multiple medical problems;Altered sensation Hydrotherapy Plan: Debridement;Dressing change;Patient/family education;Pulsatile lavage with suction Wound Therapy - Frequency: 6X / week Wound Therapy - Follow Up Recommendations: Skilled nursing facility Wound Plan: See above  Wound Therapy Goals- Improve the function of patient's integumentary system by progressing the wound(s) through the phases of wound healing (inflammation - proliferation - remodeling)  by: Decrease Necrotic Tissue to: 25 (overall) Decrease Necrotic Tissue - Progress: Progressing toward goal Increase Granulation Tissue to: 75 (overall) Increase Granulation Tissue - Progress: Progressing toward goal  Goals will be updated until maximal potential achieved or discharge criteria met.  Discharge criteria: when goals achieved, discharge from hospital, MD decision/surgical intervention, no progress towards goals, refusal/missing three consecutive treatments without notification or medical reason.  GP     Ranon Coven 04/18/2016, 3:09 PM [facility] PT [phone]

## 2016-04-18 NOTE — Progress Notes (Addendum)
Pharmacy Antibiotic Note  Joshua Park is a 45 y.o. male admitted on 04/15/2016 with osteomyelitis.  Pharmacy has been consulted for vancomycin dosing. Patient has history of paraplegia.  Patient is on day 4 of ceftriaxone and azithromycin for CAP.   Plan: Vancomycin 750mg  IV every 8 hours.  Goal trough 15-20 mcg/mL.  Monitor culture data, renal function and clinical course VT at SS prn  Height: 5\' 5"  (165.1 cm) Weight: 142 lb (64.4 kg) (bed scale) IBW/kg (Calculated) : 61.5  Temp (24hrs), Avg:99.2 F (37.3 C), Min:98.3 F (36.8 C), Max:100.3 F (37.9 C)   Recent Labs Lab 04/15/16 2252 04/15/16 2302 04/16/16 0550 04/18/16 0351  WBC 10.0  --  10.0 8.1  CREATININE 0.65  --  0.57* 0.48*  LATICACIDVEN  --  1.57  --   --     Estimated Creatinine Clearance: 102.5 mL/min (by C-G formula based on SCr of 0.48 mg/dL (L)).    No Known Allergies  Antimicrobials this admission: 10/11 Rocephin>>  10/11 Azithromycin >>  10/13 Vanc >>  Dose adjustments this admission: n/a  Microbiology results: 10/10 BCx: ngtd <24hr 10/11 Urine Cx: multiple species  10/11 MRSA PCR: positive on bactroban  Andrey Cota. Diona Foley, PharmD, [place] Clinical Pharmacist Pager [phone] 04/18/2016 1:04 PM

## 2016-04-18 NOTE — Progress Notes (Signed)
PHARMACIST - PHYSICIAN COMMUNICATION DR:   Doyle Askew CONCERNING: Antibiotic IV to Oral Route Change Policy  RECOMMENDATION: This patient is receiving azithromycin by the intravenous route.  Based on criteria approved by the Pharmacy and Therapeutics Committee, the antibiotic(s) is/are being converted to the equivalent oral dose form(s).   DESCRIPTION: These criteria include:  Patient being treated for a respiratory tract infection, urinary tract infection, cellulitis or clostridium difficile associated diarrhea if on metronidazole  The patient is not neutropenic and does not exhibit a GI malabsorption state  The patient is eating (either orally or via tube) and/or has been taking other orally administered medications for a least 24 hours  The patient is improving clinically and has a Tmax < 100.5  If you have questions about this conversion, please contact the Pharmacy Department  []   [phone] )  [facility] []   [phone] )  [facility] [x]   [phone] )  [facility] []   [phone] )  [facility] []   [phone] )  [place]. Diona Foley, PharmD, [place] Clinical Pharmacist Pager [phone]

## 2016-04-19 LAB — BASIC METABOLIC PANEL
Anion gap: 8 (ref 5–15)
BUN: 5 mg/dL — AB (ref 6–20)
CHLORIDE: 102 mmol/L (ref 101–111)
CO2: 27 mmol/L (ref 22–32)
Calcium: 8.7 mg/dL — ABNORMAL LOW (ref 8.9–10.3)
Creatinine, Ser: 0.41 mg/dL — ABNORMAL LOW (ref 0.61–1.24)
GFR calc Af Amer: >60 mL/min (ref >60)
GFR calc non Af Amer: >60 mL/min (ref >60)
GLUCOSE: 102 mg/dL — AB (ref 65–99)
POTASSIUM: 3.6 mmol/L (ref 3.5–5.1)
Sodium: 137 mmol/L (ref 135–145)

## 2016-04-19 LAB — CBC
HEMATOCRIT: 30.2 % — AB (ref 39.0–52.0)
HEMOGLOBIN: 9.6 g/dL — AB (ref 13.0–17.0)
MCH: 27.1 pg (ref 26.0–34.0)
MCHC: 31.8 g/dL (ref 30.0–36.0)
MCV: 85.3 fL (ref 78.0–100.0)
Platelets: 352 10*3/uL (ref 150–400)
RBC: 3.54 MIL/uL — AB (ref 4.22–5.81)
RDW: 15.6 % — ABNORMAL HIGH (ref 11.5–15.5)
WBC: 8.2 10*3/uL (ref 4.0–10.5)

## 2016-04-19 MED ORDER — SENNOSIDES-DOCUSATE SODIUM 8.6-50 MG PO TABS
1.0000 | ORAL_TABLET | Freq: Two times a day (BID) | ORAL | Status: DC
  Administered 2016-04-19 – 2016-04-23 (×8): 1 via ORAL
  Filled 2016-04-19 (×11): qty 1
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
## 2016-04-19 NOTE — Progress Notes (Signed)
Physical Therapy Wound Treatment Patient Details  Name: Joshua Park MRN: [id number] Date of Birth: 03/09/1971  Today's Date: 04/19/2016 Time: 1015-1055 Time Calculation (min): 40 min  Subjective  Subjective: Pt conversing appropriately. Patient and Family Stated Goals: not stated Date of Onset:  (Prior to hospitalization) Prior Treatments: Unsure  Pain Score: Pain Score: 0-No pain  Wound Assessment  Pressure Injury 04/15/16 Unstageable - Full thickness tissue loss in which the base of the ulcer is covered by slough (yellow, tan, gray, green or brown) and/or eschar (tan, brown or black) in the wound bed. yellow eschar  (Active)  Dressing Type Foam;Moist to dry;Gauze (Comment);Barrier Film (skin prep) 04/19/2016 12:36 PM  Dressing Changed;Clean;Dry;Intact 04/19/2016 12:36 PM  Dressing Change Frequency Daily 04/19/2016 12:36 PM  State of Healing Early/partial granulation 04/19/2016 12:36 PM  Site / Wound Assessment Yellow;Pink 04/19/2016 12:36 PM  % Wound base Red or Granulating 5% 04/19/2016 12:36 PM  % Wound base Yellow 95% 04/19/2016 12:36 PM  % Wound base Black 0% 04/19/2016 12:36 PM  % Wound base Other (Comment) 0% 04/19/2016 12:36 PM  Peri-wound Assessment Intact 04/19/2016 12:36 PM  Wound Length (cm) 1 cm 04/17/2016  4:47 PM  Wound Width (cm) 1.5 cm 04/17/2016  4:47 PM  Wound Depth (cm) 0.1 cm 04/17/2016  4:47 PM  Margins Unattached edges (unapproximated) 04/19/2016 12:36 PM  Drainage Amount Scant 04/19/2016 12:36 PM  Drainage Description Serous 04/19/2016 12:36 PM  Treatment Hydrotherapy (Pulse lavage);Debridement (Selective);Packing (Dry gauze) 04/19/2016 12:36 PM     Pressure Injury 04/15/16 Unstageable - Full thickness tissue loss in which the base of the ulcer is covered by slough (yellow, tan, gray, green or brown) and/or eschar (tan, brown or black) in the wound bed. yellow and off white eschar (Active)  Dressing Type Gauze (Comment);Barrier Film (skin  prep);Foam;Moist to dry 04/19/2016 12:36 PM  Dressing Changed;Clean;Dry;Intact 04/19/2016 12:36 PM  Dressing Change Frequency Daily 04/19/2016 12:36 PM  State of Healing Eschar 04/19/2016 12:36 PM  Site / Wound Assessment Yellow;Red 04/19/2016 12:36 PM  % Wound base Red or Granulating 35% 04/19/2016 12:36 PM  % Wound base Yellow 60% 04/19/2016 12:36 PM  % Wound base Black 5% 04/19/2016 12:36 PM  % Wound base Other (Comment) 0% 04/19/2016 12:36 PM  Peri-wound Assessment Intact 04/19/2016 12:36 PM  Wound Length (cm) 3 cm 04/17/2016  4:47 PM  Wound Width (cm) 5 cm 04/17/2016  4:47 PM  Wound Depth (cm) 0.1 cm 04/17/2016  4:47 PM  Margins Unattached edges (unapproximated) 04/19/2016 12:36 PM  Drainage Amount Minimal 04/19/2016 12:36 PM  Drainage Description Serosanguineous 04/19/2016 12:36 PM  Treatment Debridement (Selective);Hydrotherapy (Pulse lavage);Packing (Saline gauze) 04/19/2016 12:36 PM     Pressure Injury 04/16/16 Unstageable - Full thickness tissue loss in which the base of the ulcer is covered by slough (yellow, tan, gray, green or brown) and/or eschar (tan, brown or black) in the wound bed. eschar (Active)  Dressing Type Foam;Barrier Film (skin prep);Gauze (Comment);Moist to dry 04/19/2016 12:36 PM  Dressing Changed;Clean;Dry;Intact 04/19/2016 12:36 PM  Dressing Change Frequency Daily 04/19/2016 12:36 PM  State of Healing Eschar 04/19/2016 12:36 PM  Site / Wound Assessment Yellow;Red 04/19/2016 12:36 PM  % Wound base Red or Granulating 75% 04/19/2016 12:36 PM  % Wound base Yellow 25% 04/19/2016 12:36 PM  % Wound base Black 0% 04/19/2016 12:36 PM  % Wound base Other (Comment) 0% 04/19/2016 12:36 PM  Peri-wound Assessment Erythema (blanchable) 04/19/2016 12:36 PM  Wound Length (cm) 3.5 cm 04/17/2016  4:47 PM  Wound Width (cm) 2.3 cm 04/17/2016  4:47 PM  Wound Depth (cm) 0.2 cm 04/17/2016  4:47 PM  Margins Unattached edges (unapproximated) 04/19/2016 12:36 PM  Drainage Amount  Minimal 04/19/2016 12:36 PM  Drainage Description Serosanguineous 04/19/2016 12:36 PM  Treatment Debridement (Selective);Hydrotherapy (Pulse lavage);Packing (Saline gauze) 04/19/2016 12:36 PM     Pressure Injury 04/16/16 Unstageable - Full thickness tissue loss in which the base of the ulcer is covered by slough (yellow, tan, gray, green or brown) and/or eschar (tan, brown or black) in the wound bed. (Active)  Dressing Type Foam;Barrier Film (skin prep);Gauze (Comment);Moist to dry 04/19/2016 12:36 PM  Dressing Changed;Clean;Dry;Intact 04/19/2016 12:36 PM  Dressing Change Frequency Daily 04/19/2016 12:36 PM  State of Healing Eschar 04/19/2016 12:36 PM  Site / Wound Assessment Red;Yellow;Bleeding 04/19/2016 12:36 PM  % Wound base Red or Granulating 40% 04/19/2016 12:36 PM  % Wound base Yellow 50% 04/19/2016 12:36 PM  % Wound base Black 10% 04/19/2016 12:36 PM  % Wound base Other (Comment) 0% 04/19/2016 12:36 PM  Peri-wound Assessment Intact 04/19/2016 12:36 PM  Wound Length (cm) 7 cm 04/17/2016  4:47 PM  Wound Width (cm) 5 cm 04/17/2016  4:47 PM  Wound Depth (cm) 0.1 cm 04/17/2016  4:47 PM  Margins Unattached edges (unapproximated) 04/19/2016 12:36 PM  Drainage Amount Minimal 04/19/2016 12:36 PM  Drainage Description Serosanguineous 04/19/2016 12:36 PM  Treatment Debridement (Selective);Hydrotherapy (Pulse lavage);Packing (Saline gauze) 04/19/2016 12:36 PM     Pressure Injury 04/15/16 Stage IV - Full thickness tissue loss with exposed bone, tendon or muscle. (Active)  Dressing Type Foam;Barrier Film (skin prep);Moist to dry;Gauze (Comment) 04/19/2016 12:36 PM  Dressing Changed;Clean;Dry;Intact 04/19/2016 12:36 PM  Dressing Change Frequency Daily 04/19/2016 12:36 PM  State of Healing Eschar 04/19/2016 12:36 PM  Site / Wound Assessment Red;Yellow 04/19/2016 12:36 PM  % Wound base Red or Granulating 90% 04/19/2016 12:36 PM  % Wound base Yellow 10% 04/19/2016 12:36 PM  % Wound base Black 0%  04/19/2016 12:36 PM  % Wound base Other (Comment) 0% 04/19/2016 12:36 PM  Peri-wound Assessment Erythema (blanchable) 04/19/2016 12:36 PM  Wound Length (cm) 4 cm 04/17/2016  4:47 PM  Wound Width (cm) 2 cm 04/17/2016  4:47 PM  Wound Depth (cm) 0.2 cm 04/17/2016  4:47 PM  Margins Unattached edges (unapproximated) 04/19/2016 12:36 PM  Drainage Amount Minimal 04/19/2016 12:36 PM  Drainage Description Serosanguineous 04/19/2016 12:36 PM  Treatment Hydrotherapy (Pulse lavage);Packing (Saline gauze) 04/19/2016 12:36 PM      Hydrotherapy Pulsed lavage therapy - wound location: sacrum, buttocks, rt posterior calf Pulsed Lavage with Suction (psi): 8 psi (4-8) Pulsed Lavage with Suction - Normal Saline Used: 1000 mL Pulsed Lavage Tip: Tip with splash shield Selective Debridement Selective Debridement - Location: lt ischial tuberosity, coccyx, and Rt leg (cross-hatched) Selective Debridement - Tools Used: Forceps;Scissors Selective Debridement - Tissue Removed: yellow necrotic   Wound Assessment and Plan  Wound Therapy - Assess/Plan/Recommendations Wound Therapy - Clinical Statement: Wounds progressing well with removal of necrotic tissue.  Wound Therapy - Functional Problem List: Decr sitting tolerance Factors Delaying/Impairing Wound Healing: Immobility;Multiple medical problems;Altered sensation Hydrotherapy Plan: Debridement;Dressing change;Patient/family education;Pulsatile lavage with suction Wound Therapy - Frequency: 6X / week Wound Therapy - Follow Up Recommendations: Skilled nursing facility Wound Plan: See above  Wound Therapy Goals- Improve the function of patient's integumentary system by progressing the wound(s) through the phases of wound healing (inflammation - proliferation - remodeling) by: Decrease Necrotic Tissue to: 25 (overall) Decrease Necrotic Tissue - Progress: Progressing  toward goal Increase Granulation Tissue to: 75 (overall) Increase Granulation Tissue -  Progress: Progressing toward goal  Goals will be updated until maximal potential achieved or discharge criteria met.  Discharge criteria: when goals achieved, discharge from hospital, MD decision/surgical intervention, no progress towards goals, refusal/missing three consecutive treatments without notification or medical reason.  GP     Goerge Mohr 04/19/2016, 12:43 PM Pager [phone]

## 2016-04-20 LAB — CULTURE, BLOOD (ROUTINE X 2)
Culture: NO GROWTH
Culture: NO GROWTH

## 2016-04-20 LAB — CBC
HCT: 28.4 % — ABNORMAL LOW (ref 39.0–52.0)
Hemoglobin: 8.9 g/dL — ABNORMAL LOW (ref 13.0–17.0)
MCH: 26.4 pg (ref 26.0–34.0)
MCHC: 31.3 g/dL (ref 30.0–36.0)
MCV: 84.3 fL (ref 78.0–100.0)
PLATELETS: 375 10*3/uL (ref 150–400)
RBC: 3.37 MIL/uL — AB (ref 4.22–5.81)
RDW: 15.5 % (ref 11.5–15.5)
WBC: 7 10*3/uL (ref 4.0–10.5)

## 2016-04-20 LAB — URINE CULTURE: Culture: >=100000 — AB

## 2016-04-20 LAB — BASIC METABOLIC PANEL WITH GFR
Anion gap: 9 (ref 5–15)
BUN: 5 mg/dL — ABNORMAL LOW (ref 6–20)
CO2: 28 mmol/L (ref 22–32)
Calcium: 8.8 mg/dL — ABNORMAL LOW (ref 8.9–10.3)
Chloride: 99 mmol/L — ABNORMAL LOW (ref 101–111)
Creatinine, Ser: 0.39 mg/dL — ABNORMAL LOW (ref 0.61–1.24)
GFR calc Af Amer: >60 mL/min
GFR calc non Af Amer: >60 mL/min
Glucose, Bld: 109 mg/dL — ABNORMAL HIGH (ref 65–99)
Potassium: 3.1 mmol/L — ABNORMAL LOW (ref 3.5–5.1)
Sodium: 136 mmol/L (ref 135–145)

## 2016-04-20 MED ORDER — BISACODYL 10 MG RE SUPP
10.0000 mg | Freq: Once | RECTAL | Status: AC
  Administered 2016-04-20: 10 mg via RECTAL
  Filled 2016-04-20: qty 1

## 2016-04-20 MED ORDER — POTASSIUM CHLORIDE CRYS ER 20 MEQ PO TBCR
40.0000 meq | EXTENDED_RELEASE_TABLET | Freq: Once | ORAL | Status: AC
  Administered 2016-04-20: 40 meq via ORAL
  Filled 2016-04-20: qty 2
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
## 2016-04-20 NOTE — Progress Notes (Signed)
Patient is alert and oriented. Upon assessment having cool clammy sweats. Offered to changed sheets but refused at this time. States he would like to wait until 0100-0300 to get foley care and bath.

## 2016-04-20 NOTE — Progress Notes (Signed)
Foley has been leaking. Dr. Doyle Askew notified.

## 2016-04-21 ENCOUNTER — Encounter (HOSPITAL_COMMUNITY): Payer: Self-pay | Admitting: Physician Assistant

## 2016-04-21 DIAGNOSIS — L89159 Pressure ulcer of sacral region, unspecified stage: Secondary | ICD-10-CM

## 2016-04-21 DIAGNOSIS — D72829 Elevated white blood cell count, unspecified: Secondary | ICD-10-CM

## 2016-04-21 DIAGNOSIS — Z87828 Personal history of other (healed) physical injury and trauma: Secondary | ICD-10-CM

## 2016-04-21 DIAGNOSIS — B9689 Other specified bacterial agents as the cause of diseases classified elsewhere: Secondary | ICD-10-CM

## 2016-04-21 DIAGNOSIS — L89899 Pressure ulcer of other site, unspecified stage: Secondary | ICD-10-CM

## 2016-04-21 DIAGNOSIS — Y95 Nosocomial condition: Secondary | ICD-10-CM

## 2016-04-21 DIAGNOSIS — L89309 Pressure ulcer of unspecified buttock, unspecified stage: Secondary | ICD-10-CM

## 2016-04-21 DIAGNOSIS — M4628 Osteomyelitis of vertebra, sacral and sacrococcygeal region: Secondary | ICD-10-CM

## 2016-04-21 DIAGNOSIS — G8250 Quadriplegia, unspecified: Secondary | ICD-10-CM

## 2016-04-21 LAB — BASIC METABOLIC PANEL
Anion gap: 12 (ref 5–15)
BUN: 6 mg/dL (ref 6–20)
CALCIUM: 9.4 mg/dL (ref 8.9–10.3)
CO2: 24 mmol/L (ref 22–32)
CREATININE: 0.47 mg/dL — AB (ref 0.61–1.24)
Chloride: 99 mmol/L — ABNORMAL LOW (ref 101–111)
Glucose, Bld: 123 mg/dL — ABNORMAL HIGH (ref 65–99)
Potassium: 4.3 mmol/L (ref 3.5–5.1)
SODIUM: 135 mmol/L (ref 135–145)

## 2016-04-21 LAB — CBC
HCT: 35.6 % — ABNORMAL LOW (ref 39.0–52.0)
Hemoglobin: 11 g/dL — ABNORMAL LOW (ref 13.0–17.0)
MCH: 26.3 pg (ref 26.0–34.0)
MCHC: 30.9 g/dL (ref 30.0–36.0)
MCV: 85.2 fL (ref 78.0–100.0)
PLATELETS: 510 10*3/uL — AB (ref 150–400)
RBC: 4.18 MIL/uL — ABNORMAL LOW (ref 4.22–5.81)
RDW: 15.8 % — AB (ref 11.5–15.5)
WBC: 14.5 10*3/uL — AB (ref 4.0–10.5)

## 2016-04-21 LAB — VANCOMYCIN, TROUGH: VANCOMYCIN TR: 14 ug/mL — AB (ref 15–20)

## 2016-04-21 MED ORDER — VANCOMYCIN HCL IN DEXTROSE 1-5 GM/200ML-% IV SOLN
1000.0000 mg | Freq: Three times a day (TID) | INTRAVENOUS | Status: DC
  Administered 2016-04-22 – 2016-04-23 (×5): 1000 mg via INTRAVENOUS
  Filled 2016-04-21 (×7): qty 200

## 2016-04-21 MED ORDER — BOOST / RESOURCE BREEZE PO LIQD
1.0000 | Freq: Three times a day (TID) | ORAL | Status: DC
  Administered 2016-04-22 – 2016-04-24 (×3): 1 via ORAL

## 2016-04-21 MED ORDER — PRO-STAT SUGAR FREE PO LIQD
30.0000 mL | Freq: Two times a day (BID) | ORAL | Status: DC
  Filled 2016-04-21 (×4): qty 30

## 2016-04-21 MED ORDER — ADULT MULTIVITAMIN W/MINERALS CH
1.0000 | ORAL_TABLET | Freq: Every day | ORAL | Status: DC
  Administered 2016-04-21 – 2016-04-24 (×4): 1 via ORAL
  Filled 2016-04-21 (×3): qty 1

## 2016-04-21 MED ORDER — BISACODYL 10 MG RE SUPP
10.0000 mg | Freq: Once | RECTAL | Status: AC
  Administered 2016-04-21: 10 mg via RECTAL
  Filled 2016-04-21: qty 1

## 2016-04-21 NOTE — NC FL2 (Signed)
[place] LEVEL OF CARE SCREENING TOOL     IDENTIFICATION  Patient Name: Joshua Park Birthdate: [birth year] Sex: male Admission Date (Current Location): 04/15/2016  [place] and [place] Number:  Kathleen Argue [id number] [place] and Address:  [facility] [place]. [facility], [place] [address], [place], [place] [address]      Provider Number: [id number]  Attending Physician Name and Address:  Theodis Blaze, MD  Relative Name and Phone Number:  Karilyn Cota - mother. Phone number - [phone]    Current Level of Care: Hospital Recommended Level of Care: [place] Prior Approval Number:    Date Approved/Denied:   PASRR Number: [id number]  Discharge Plan: SNF    Current Diagnoses: Patient Active Problem List   Diagnosis Date Noted  . CAP (community acquired pneumonia) 04/16/2016  . Sepsis ([place]) 04/16/2016  . Major depressive disorder, recurrent episode, mild ([place]) 12/11/2015  . Suicidal ideation 12/10/2015  . Failure to thrive in adult 12/05/2015  . Tachycardia with hypertension 11/12/2015  . Pressure ulcer 11/07/2015  . Abdominal pain   . Chest pain 11/03/2015  . Abnormal EKG 11/03/2015  . Severe episode of recurrent major depressive disorder, with psychotic features ([place])   . Hypokalemia 11/02/2015  . Elevated troponin 11/02/2015  . Presence of IVC filter 11/01/2015  . Acute osteomyelitis of toe of left foot ([place]) 10/01/2015  . Pressure ulcer of left foot, stage 4 ([place]) 09/03/2015  . Refusal of care by patient 08/28/2015  . Chronic indwelling Foley catheter 06/27/2015  . Hypotension 06/27/2015  . Paralytic syndrome, bilateral ([place]) 05/07/2015  . MDD (major depressive disorder), recurrent, severe, with psychosis ([place]) 03/16/2015  . Protein-calorie malnutrition, severe ([place]) 03/16/2015  . Chronic pain syndrome 02/13/2015  . Paranoia (psychosis) ([place]) 12/17/2014  . GERD (gastroesophageal reflux disease) 03/21/2014  . Constipation  05/14/2012  . Paraplegia ([place]) 02/09/2007  . Neurogenic bladder 02/09/2007    Orientation RESPIRATION BLADDER Height & Weight     Self, Time, Situation, Place  Normal Continent Weight: 65.3 kg (144 lb) (bed) Height:  5\' 5"  (165.1 cm)  BEHAVIORAL SYMPTOMS/MOOD NEUROLOGICAL BOWEL NUTRITION STATUS     (None) Continent Diet (Please see DC Summary)  AMBULATORY STATUS COMMUNICATION OF NEEDS Skin   Total Care Verbally PU Stage and Appropriate Care, Other (Comment) (Unstageable Pressure injury to buttocks, coccyx, and leg; stage II on foot;Stage IV on ischial tuberosity; wound on knee;)                       Personal Care Assistance Level of Assistance  Bathing, Feeding, Dressing Bathing Assistance: Maximum assistance Feeding assistance: Limited assistance Dressing Assistance: Maximum assistance     Functional Limitations Info  Sight, Hearing, Speech Sight Info: Adequate Hearing Info: Adequate Speech Info: Adequate    SPECIAL CARE FACTORS FREQUENCY  PT (By licensed PT)     PT Frequency: 5x/week              Contractures Contractures Info: Not present    Additional Factors Info  Code Status, Allergies, Isolation Precautions Code Status Info: Full Allergies Info: NKA     Isolation Precautions Info: MRSA     Current Medications (04/21/2016):  This is the current hospital active medication list Current Facility-Administered Medications  Medication Dose Route Frequency Provider Last Rate Last Dose  . acetaminophen (TYLENOL) tablet 650 mg  650 mg Oral Q6H PRN Norval Morton, MD   650 mg at 04/17/16 1733   Or  .  acetaminophen (TYLENOL) suppository 650 mg  650 mg Rectal Q6H PRN Norval Morton, MD      . albuterol (PROVENTIL) (2.5 MG/3ML) 0.083% nebulizer solution 2.5 mg  2.5 mg Nebulization Q2H PRN Norval Morton, MD   2.5 mg at 04/17/16 2200  . baclofen (LIORESAL) tablet 20 mg  20 mg Oral QID Varney Biles, MD   20 mg at 04/21/16 0926  . bisacodyl (DULCOLAX)  suppository 10 mg  10 mg Rectal Once Theodis Blaze, MD      . collagenase (SANTYL) ointment   Topical Daily Theodis Blaze, MD      . docusate sodium (COLACE) capsule 100 mg  100 mg Oral BID Norval Morton, MD   100 mg at 04/21/16 0926  . enoxaparin (LOVENOX) injection 40 mg  40 mg Subcutaneous Q24H Norval Morton, MD   40 mg at 04/20/16 1725  . feeding supplement (ENSURE ENLIVE) (ENSURE ENLIVE) liquid 237 mL  237 mL Oral BID BM Theodis Blaze, MD   237 mL at 04/17/16 1000  . feeding supplement (PRO-STAT SUGAR FREE 64) liquid 30 mL  30 mL Oral TID BM Theodis Blaze, MD   30 mL at 04/20/16 0846  . gabapentin (NEURONTIN) capsule 300 mg  300 mg Oral Q6H Tiffany Greene, PA-C   300 mg at 04/21/16 0925  . LORazepam (ATIVAN) tablet 1 mg  1 mg Oral Q4H PRN Norval Morton, MD      . morphine 2 MG/ML injection 1 mg  1 mg Intravenous Once Gardiner Barefoot, NP      . ondansetron (ZOFRAN) tablet 4 mg  4 mg Oral Q6H PRN Norval Morton, MD       Or  . ondansetron (ZOFRAN) injection 4 mg  4 mg Intravenous Q6H PRN Norval Morton, MD      . oxyCODONE (Oxy IR/ROXICODONE) immediate release tablet 5 mg  5 mg Oral Q6H Rondell Charmayne Sheer, MD   5 mg at 04/21/16 1240  . oxyCODONE (Oxy IR/ROXICODONE) immediate release tablet 5-10 mg  5-10 mg Oral Q6H PRN Theodis Blaze, MD   10 mg at 04/21/16 0925  . phenazopyridine (PYRIDIUM) tablet 100 mg  100 mg Oral TID PRN Norval Morton, MD   100 mg at 04/21/16 0306  . polyethylene glycol (MIRALAX / GLYCOLAX) packet 17 g  17 g Oral Daily Norval Morton, MD   17 g at 04/21/16 0926  . senna-docusate (Senokot-S) tablet 1 tablet  1 tablet Oral BID Theodis Blaze, MD   1 tablet at 04/21/16 0925  . vancomycin (VANCOCIN) IVPB 750 mg/150 ml premix  750 mg Intravenous Q8H Rebecka Apley, RPH   750 mg at 04/21/16 1240     Discharge Medications: Please see discharge summary for a list of discharge medications.  Relevant Imaging Results:  Relevant Lab Results:   Additional  Information SSN: [id number]  Will not need hydrotherapy at discharge.  Benard Halsted, LCSWA
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
## 2016-04-21 NOTE — Progress Notes (Signed)
Pharmacy Antibiotic Note  Joshua Park is a 45 y.o. male admitted on 04/15/2016 with osteomyelitis. Pharmacy has been consulted for vancomycin dosing - Day #4. Patient has history of paraplegia, SCr at baseline and stable, 0.47, good UOP. Afebrile, wbc up 14.5. Plastic Surgery consulted per MD note.  Patient is also on day 6/7 of ceftriaxone and azithromycin PO for CAP, providencia stuartii UTI.   Plan: Vancomycin 750mg  IV every 8 hours. Goal trough 15-20 mcg/mL Rocephin 1g IV Q24H per MD Azithromycin 500mg  PO Q24H per MD Monitor culture data, renal function, and clinical course VT this afternoon  Height: 5\' 5"  (165.1 cm) Weight: 144 lb (65.3 kg) (bed) IBW/kg (Calculated) : 61.5  Temp (24hrs), Avg:98.9 F (37.2 C), Min:98.3 F (36.8 C), Max:99.5 F (37.5 C)   Recent Labs Lab 04/15/16 2302 04/16/16 0550 04/18/16 0351 04/19/16 0533 04/20/16 0419 04/21/16 0421  WBC  --  10.0 8.1 8.2 7.0 14.5*  CREATININE  --  0.57* 0.48* 0.41* 0.39* 0.47*  LATICACIDVEN 1.57  --   --   --   --   --     Estimated Creatinine Clearance: 102.5 mL/min (by C-G formula based on SCr of 0.47 mg/dL (L)).    No Known Allergies  Antimicrobials this admission: 10/11 Rocephin>>  10/11 Azithromycin PO>>  10/13 Vanc >>  Dose adjustments this admission: n/a  Microbiology results: 10/10 BCx: neg 10/11 Urine Cx: providencia stuartii (S-ctx, imi, zosyn, bactrim) 10/11 MRSA PCR: positive on [place], PharmD, BCPS Clinical Pharmacist 04/21/2016 10:55 AM

## 2016-04-21 NOTE — Progress Notes (Signed)
patient alert vitals stable high b/p with poor pain control. Guarded with care, However pt allowed myself and NT to bath and give care, IV placed in the Right upper axilla area. May need a Central ine for long term antibiotics.
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
## 2016-04-21 NOTE — Progress Notes (Signed)
Physical Therapy Wound Treatment Patient Details  Name: Joshua Park MRN: [id number] Date of Birth: [birth year]  Today's Date: 04/21/2016 Time: 3825-0539 Time Calculation (min): 39 min  Subjective  Subjective: Pt asking how the wounds look Patient and Family Stated Goals: not stated Date of Onset:  (Prior to hospitalization) Prior Treatments: Unsure  Pain Score:  Pain with movement. Pt premedicated.  Wound Assessment  Pressure Injury 04/15/16 Unstageable - Full thickness tissue loss in which the base of the ulcer is covered by slough (yellow, tan, gray, green or brown) and/or eschar (tan, brown or black) in the wound bed. yellow eschar  (Active)  Dressing Type Foam;Moist to dry;Gauze (Comment);Barrier Film (skin prep) 04/21/2016  2:26 PM  Dressing Changed;Clean;Dry;Intact 04/21/2016  2:26 PM  Dressing Change Frequency Daily 04/21/2016  2:26 PM  State of Healing Early/partial granulation 04/21/2016  2:26 PM  Site / Wound Assessment Yellow;Pink 04/21/2016  2:26 PM  % Wound base Red or Granulating 5% 04/21/2016  2:26 PM  % Wound base Yellow 95% 04/21/2016  2:26 PM  % Wound base Black 0% 04/21/2016  2:26 PM  % Wound base Other (Comment) 0% 04/21/2016  2:26 PM  Peri-wound Assessment Intact 04/21/2016  2:26 PM  Wound Length (cm) 1 cm 04/17/2016  4:47 PM  Wound Width (cm) 1.5 cm 04/17/2016  4:47 PM  Wound Depth (cm) 0.1 cm 04/17/2016  4:47 PM  Margins Unattached edges (unapproximated) 04/21/2016  2:26 PM  Drainage Amount Scant 04/21/2016  2:26 PM  Drainage Description Serous 04/21/2016  2:26 PM  Treatment Hydrotherapy (Pulse lavage);Packing (Saline gauze) 04/21/2016  2:26 PM     Pressure Injury 04/15/16 Unstageable - Full thickness tissue loss in which the base of the ulcer is covered by slough (yellow, tan, gray, green or brown) and/or eschar (tan, brown or black) in the wound bed. yellow and off white eschar (Active)  Dressing Type Gauze (Comment);Barrier Film (skin prep);Foam;Moist  to dry 04/21/2016  2:26 PM  Dressing Changed;Clean;Dry;Intact 04/21/2016  2:26 PM  Dressing Change Frequency Daily 04/21/2016  2:26 PM  State of Healing Eschar 04/21/2016  2:26 PM  Site / Wound Assessment Yellow;Red 04/21/2016  2:26 PM  % Wound base Red or Granulating 35% 04/21/2016  2:26 PM  % Wound base Yellow 60% 04/21/2016  2:26 PM  % Wound base Black 5% 04/21/2016  2:26 PM  % Wound base Other (Comment) 0% 04/21/2016  2:26 PM  Peri-wound Assessment Intact 04/21/2016  2:26 PM  Wound Length (cm) 3 cm 04/17/2016  4:47 PM  Wound Width (cm) 5 cm 04/17/2016  4:47 PM  Wound Depth (cm) 0.1 cm 04/17/2016  4:47 PM  Margins Unattached edges (unapproximated) 04/21/2016  2:26 PM  Drainage Amount Minimal 04/21/2016  2:26 PM  Drainage Description Serosanguineous 04/21/2016  2:26 PM  Treatment Hydrotherapy (Pulse lavage);Packing (Saline gauze) 04/21/2016  2:26 PM     Pressure Injury 04/16/16 Unstageable - Full thickness tissue loss in which the base of the ulcer is covered by slough (yellow, tan, gray, green or brown) and/or eschar (tan, brown or black) in the wound bed. eschar (Active)  Dressing Type Foam;Barrier Film (skin prep);Gauze (Comment);Moist to dry 04/21/2016  2:26 PM  Dressing Changed;Clean;Dry;Intact 04/21/2016  2:26 PM  Dressing Change Frequency Daily 04/21/2016  2:26 PM  State of Healing Eschar 04/21/2016  2:26 PM  Site / Wound Assessment Yellow;Red 04/21/2016  2:26 PM  % Wound base Red or Granulating 75% 04/21/2016  2:26 PM  % Wound base Yellow 25% 04/21/2016  2:26  PM  % Wound base Black 0% 04/21/2016  2:26 PM  % Wound base Other (Comment) 0% 04/21/2016  2:26 PM  Peri-wound Assessment Erythema (blanchable) 04/21/2016  2:26 PM  Wound Length (cm) 3.5 cm 04/17/2016  4:47 PM  Wound Width (cm) 2.3 cm 04/17/2016  4:47 PM  Wound Depth (cm) 0.2 cm 04/17/2016  4:47 PM  Margins Unattached edges (unapproximated) 04/21/2016  2:26 PM  Drainage Amount Minimal 04/21/2016  2:26 PM  Drainage  Description Serosanguineous 04/21/2016  2:26 PM  Treatment Hydrotherapy (Pulse lavage);Packing (Saline gauze) 04/21/2016  2:26 PM     Pressure Injury 04/16/16 Unstageable - Full thickness tissue loss in which the base of the ulcer is covered by slough (yellow, tan, gray, green or brown) and/or eschar (tan, brown or black) in the wound bed. (Active)  Dressing Type Foam;Barrier Film (skin prep);Gauze (Comment);Moist to dry 04/21/2016  2:26 PM  Dressing Changed;Clean;Dry;Intact 04/21/2016  2:26 PM  Dressing Change Frequency Daily 04/21/2016  2:26 PM  State of Healing Eschar 04/21/2016  2:26 PM  Site / Wound Assessment Red;Yellow;Bleeding 04/21/2016  2:26 PM  % Wound base Red or Granulating 30% 04/21/2016  2:26 PM  % Wound base Yellow 60% 04/21/2016  2:26 PM  % Wound base Black 10% 04/21/2016  2:26 PM  % Wound base Other (Comment) 0% 04/21/2016  2:26 PM  Peri-wound Assessment Intact 04/21/2016  2:26 PM  Wound Length (cm) 7 cm 04/17/2016  4:47 PM  Wound Width (cm) 5 cm 04/17/2016  4:47 PM  Wound Depth (cm) 0.1 cm 04/17/2016  4:47 PM  Margins Unattached edges (unapproximated) 04/21/2016  2:26 PM  Drainage Amount Minimal 04/21/2016  2:26 PM  Drainage Description Serosanguineous 04/21/2016  2:26 PM  Treatment Hydrotherapy (Pulse lavage);Packing (Saline gauze) 04/21/2016  2:26 PM     Pressure Injury 04/15/16 Stage IV - Full thickness tissue loss with exposed bone, tendon or muscle. (Active)  Dressing Type Foam;Barrier Film (skin prep);Moist to dry;Gauze (Comment) 04/21/2016  2:26 PM  Dressing Changed;Clean;Dry;Intact 04/21/2016  2:26 PM  Dressing Change Frequency Daily 04/21/2016  2:26 PM  State of Healing Eschar 04/21/2016  2:26 PM  Site / Wound Assessment Red;Yellow 04/21/2016  2:26 PM  % Wound base Red or Granulating 90% 04/21/2016  2:26 PM  % Wound base Yellow 10% 04/21/2016  2:26 PM  % Wound base Black 0% 04/21/2016  2:26 PM  % Wound base Other (Comment) 0% 04/21/2016  2:26 PM  Peri-wound  Assessment Erythema (blanchable) 04/21/2016  2:26 PM  Wound Length (cm) 4 cm 04/17/2016  4:47 PM  Wound Width (cm) 2 cm 04/17/2016  4:47 PM  Wound Depth (cm) 0.2 cm 04/17/2016  4:47 PM  Margins Unattached edges (unapproximated) 04/21/2016  2:26 PM  Drainage Amount Minimal 04/21/2016  2:26 PM  Drainage Description Serosanguineous 04/21/2016  2:26 PM  Treatment Hydrotherapy (Pulse lavage);Packing (Saline gauze) 04/21/2016  2:26 PM   Santyl applied to wound bed prior to applying dressing.    Hydrotherapy Pulsed lavage therapy - wound location: sacrum, buttocks, rt posterior calf Pulsed Lavage with Suction (psi): 8 psi (4-8) Pulsed Lavage with Suction - Normal Saline Used: 1000 mL Pulsed Lavage Tip: Tip with splash shield   Wound Assessment and Plan  Wound Therapy - Assess/Plan/Recommendations Wound Therapy - Clinical Statement: Wounds stable with continued work to remove necrotic tissue.  Wound Therapy - Functional Problem List: Decr sitting tolerance Factors Delaying/Impairing Wound Healing: Immobility;Multiple medical problems;Altered sensation Hydrotherapy Plan: Debridement;Dressing change;Patient/family education;Pulsatile lavage with suction Wound Therapy - Frequency:  6X / week Wound Therapy - Follow Up Recommendations: Skilled nursing facility (Dressing changes.) Wound Plan: See above  Wound Therapy Goals- Improve the function of patient's integumentary system by progressing the wound(s) through the phases of wound healing (inflammation - proliferation - remodeling) by: Decrease Necrotic Tissue to: 25 (overall) Decrease Necrotic Tissue - Progress: Progressing toward goal Increase Granulation Tissue to: 75 (overall) Increase Granulation Tissue - Progress: Progressing toward goal  Goals will be updated until maximal potential achieved or discharge criteria met.  Discharge criteria: when goals achieved, discharge from hospital, MD decision/surgical intervention, no progress towards  goals, refusal/missing three consecutive treatments without notification or medical reason.  GP     , 04/21/2016, 2:32 PM [facility] PT [phone]

## 2016-04-21 NOTE — Progress Notes (Signed)
04/21/16  Pharmacy- Vancomycin 2205    Vanc Trough 14   A/P:  45yo paraplegic male with osteomyelitis, pna and UTI.  Vanc trough is just below goal of 15-20.   Cr is stable.  - Increase Vancomycin to 1000mg  IV q8 - Repeat Vanc trough at steady state - F/U cx data, renal fxn, and clinical course   Gracy Bruins, PharmD Clinical Pharmacist [facility]

## 2016-04-22 DIAGNOSIS — J189 Pneumonia, unspecified organism: Secondary | ICD-10-CM

## 2016-04-22 DIAGNOSIS — L89159 Pressure ulcer of sacral region, unspecified stage: Secondary | ICD-10-CM

## 2016-04-22 DIAGNOSIS — L853 Xerosis cutis: Secondary | ICD-10-CM

## 2016-04-22 DIAGNOSIS — R509 Fever, unspecified: Secondary | ICD-10-CM

## 2016-04-22 DIAGNOSIS — S31000A Unspecified open wound of lower back and pelvis without penetration into retroperitoneum, initial encounter: Secondary | ICD-10-CM

## 2016-04-22 LAB — CBC
HCT: 27.6 % — ABNORMAL LOW (ref 39.0–52.0)
Hemoglobin: 9 g/dL — ABNORMAL LOW (ref 13.0–17.0)
MCH: 26.9 pg (ref 26.0–34.0)
MCHC: 32.6 g/dL (ref 30.0–36.0)
MCV: 82.6 fL (ref 78.0–100.0)
PLATELETS: 445 10*3/uL — AB (ref 150–400)
RBC: 3.34 MIL/uL — ABNORMAL LOW (ref 4.22–5.81)
RDW: 15.7 % — AB (ref 11.5–15.5)
WBC: 9.5 10*3/uL (ref 4.0–10.5)

## 2016-04-22 LAB — SEDIMENTATION RATE: SED RATE: 130 mm/h — AB (ref 0–16)

## 2016-04-22 LAB — BASIC METABOLIC PANEL
ANION GAP: 7 (ref 5–15)
BUN: 8 mg/dL (ref 6–20)
CALCIUM: 8.9 mg/dL (ref 8.9–10.3)
CO2: 26 mmol/L (ref 22–32)
CREATININE: 0.49 mg/dL — AB (ref 0.61–1.24)
Chloride: 100 mmol/L — ABNORMAL LOW (ref 101–111)
GLUCOSE: 98 mg/dL (ref 65–99)
Potassium: 3.5 mmol/L (ref 3.5–5.1)
Sodium: 133 mmol/L — ABNORMAL LOW (ref 135–145)

## 2016-04-22 LAB — C-REACTIVE PROTEIN: CRP: 21.1 mg/dL — AB (ref <1.0)

## 2016-04-22 MED ORDER — DEXTROSE 5 % IV SOLN
1.0000 g | INTRAVENOUS | Status: DC
  Administered 2016-04-22 – 2016-04-24 (×3): 1 g via INTRAVENOUS
  Filled 2016-04-22 (×3): qty 10

## 2016-04-22 NOTE — Progress Notes (Addendum)
[facility] for Infectious Disease    Date of Admission:  04/15/2016   Total days of antibiotics 8        Day 8 ceftriaxone        Day 5 vanco           ID: Joshua Park is a 45 y.o. male with ischial wounds and complicated uti Active Problems:   Paraplegia (HCC)   Neurogenic bladder   Pressure ulcer of left foot, stage 4 (HCC)   Pressure ulcer   CAP (community acquired pneumonia)   Sepsis ([place])    Subjective: Afebrile, evaluated by plastic surgery yesterday who recommended ongoing wound care and no need for surgical intervention at this time  Medications:  . baclofen  20 mg Oral QID  . cefTRIAXone (ROCEPHIN)  IV  1 g Intravenous Q24H  . collagenase   Topical Daily  . docusate sodium  100 mg Oral BID  . enoxaparin (LOVENOX) injection  40 mg Subcutaneous Q24H  . feeding supplement  1 Container Oral TID BM  . feeding supplement (PRO-STAT SUGAR FREE 64)  30 mL Oral BID WC  . gabapentin  300 mg Oral Q6H  .  morphine injection  1 mg Intravenous Once  . multivitamin with minerals  1 tablet Oral Daily  . oxyCODONE  5 mg Oral Q6H  . polyethylene glycol  17 g Oral Daily  . senna-docusate  1 tablet Oral BID  . vancomycin  1,000 mg Intravenous Q8H    Objective: Vital signs in last 24 hours: Temp:  [98.2 F (36.8 C)-99.8 F (37.7 C)] 98.2 F (36.8 C) (10/17 0627) Pulse Rate:  [82-103] 90 (10/17 0627) Resp:  [20-21] 20 (10/17 0627) BP: (101-117)/(68-76) 101/68 (10/17 0627) SpO2:  [97 %-100 %] 100 % (10/17 0627) Weight:  [144 lb (65.3 kg)] 144 lb (65.3 kg) (10/17 [id number]) Physical Exam  Constitutional:  oriented to person, place, and time. appears well-developed and well-nourished. No distress.  HENT: [place]/AT, PERRLA, no scleral icterus Mouth/Throat: Oropharynx is clear and moist. No oropharyngeal exudate.  Cardiovascular: Normal rate, regular rhythm and normal heart sounds. Exam reveals no gallop and no friction rub.  No murmur heard.  Pulmonary/Chest: Effort normal  and breath sounds normal. No respiratory distress.  has no wheezes.  Neck = supple, no nuchal rigidity Abdominal: Soft. Bowel sounds are normal.  exhibits no distension. There is no tenderness.  Lymphadenopathy: no cervical adenopathy. No axillary adenopathy Neurological: spasticity of lower extremities during visit, wasted/loss of muscle mass in legs and arms Skin: Skin is warm and dry. Exfoliating on lower extremities due to dryness Psychiatric: flat affect   Lab Results  Recent Labs  04/21/16 0421 04/22/16 0254  WBC 14.5* 9.5  HGB 11.0* 9.0*  HCT 35.6* 27.6*  NA 135 133*  K 4.3 3.5  CL 99* 100*  CO2 24 26  BUN 6 8  CREATININE 0.47* 0.49*   Sedimentation Rate  Recent Labs  04/22/16 0254  ESRSEDRATE 130*   C-Reactive Protein  Recent Labs  04/22/16 0254  CRP 21.1*    Microbiology: 10/12 providencia -ur cxv Studies/Results: No results found.   Assessment/Plan: 45yo M with quadraplegia and poorly kept pressure ulcers to ischium as well as pneumonia +/- complicated providencia UTI. Concern for new pelvic osteo though evaluation by plastics thought this was consistent with deep wounds requiring local wound care  Complicated uti = continue on ceftriaxone with plan to treat for a total of 7 days  Deep sacral  wounds = continue with vanco and ceftriaxone for a total of 14 days  Dry skin = recommend to have aids use daily hydrating lotion to minimize risk of cellulitis.  Will sign off, call if further questions  Baxter Flattery [facility] for Infectious Diseases Cell: [phone] Pager: [phone]  04/22/2016, 11:54 AM

## 2016-04-22 NOTE — Clinical Social Work Note (Signed)
Clinical Social Work Assessment  Patient Details  Name: Joshua Park MRN: [id number] Date of Birth: 1970-09-10  Date of referral:  04/17/16               Reason for consult:  Facility Placement, Discharge Planning                Permission sought to share information with:  Chartered certified accountant granted to share information::  Yes, Verbal Permission Granted  Name::        Agency::  [facility]  Relationship::     Contact Information:     Housing/Transportation Living arrangements for the past 2 months:  [place] of Information:  Patient, Medical Team Patient Interpreter Needed:  None Criminal Activity/Legal Involvement Pertinent to Current Situation/Hospitalization:  No - Comment as needed Significant Relationships:  Parents, Siblings Lives with:  Facility Resident Do you feel safe going back to the place where you live?  Yes Need for family participation in patient care:  Yes (Comment)  Care giving concerns:  Patient is a long-term resident at [facility].   Social Worker assessment / plan:  CSW met with patient. No supports at bedside. CSW introduced role and explained that discharge planning would be discussed. Patient confirmed that he is from [facility] and the plan is to return once discharged. No further concerns. CSW encouraged patient to contact CSW as needed. CSW will continue to follow patient for support and facilitate discharge back to SNF once medically stable. Patient is now off airborne precautions. Per hospital liason, patient needs psych eval prior to returning due to behavioral concerns while at facility and asking staff to help him die. MD has been notified via text page.   Employment status:  Disabled (Comment on whether or not currently receiving Disability) Insurance information:  Medicaid In [place] PT Recommendations:  Not assessed at this time Information / Referral to community resources:  [place]  Patient/Family's Response to care:  Patient agreeable to return to SNF. Patient's family supportive and involved in patient's care. Patient appreciated social work intervention.  Patient/Family's Understanding of and Emotional Response to Diagnosis, Current Treatment, and Prognosis:  Patient understands need for return to SNF. Patient appears happy with hospital care.  Emotional Assessment Appearance:  Appears stated age Attitude/Demeanor/Rapport:  Other (Pleasant) Affect (typically observed):  Accepting, Appropriate, Calm, Pleasant Orientation:  Oriented to Self, Oriented to Place, Oriented to  Time, Oriented to Situation Alcohol / Substance use:  Never Used Psych involvement (Current and /or in the community):   (Waiting on psych eval)  Discharge Needs  Concerns to be addressed:  Care Coordination, Mental Health Concerns Readmission within the last 30 days:  No Current discharge risk:  Dependent with Mobility, Psychiatric Illness Barriers to Discharge:  Other (SNF requesting psych eval before he returns. MD aware.)   Candie Chroman, LCSW 04/22/2016, 12:36 PM

## 2016-04-22 NOTE — Progress Notes (Signed)
Physical Therapy Wound Treatment Patient Details  Name: Joshua Park MRN: [id number] Date of Birth: 01-Jan-1971  Today's Date: 04/22/2016 Time: 8119-1478 Time Calculation (min): 36 min  Subjective  Subjective: Man you scrubbing it off, aren't you... Patient and Family Stated Goals: not stated Date of Onset:  (Prior to hospitalization) Prior Treatments: Unsure  Pain Score: Pain Score: 5   Wound Assessment  Pressure Injury 04/15/16 Unstageable - Full thickness tissue loss in which the base of the ulcer is covered by slough (yellow, tan, gray, green or brown) and/or eschar (tan, brown or black) in the wound bed. yellow eschar  (Active)  Dressing Type Foam;Moist to dry;Gauze (Comment);Barrier Film (skin prep) 04/22/2016  2:59 PM  Dressing Changed;Clean;Dry;Intact 04/22/2016  2:59 PM  Dressing Change Frequency Daily 04/22/2016  2:59 PM  State of Healing Early/partial granulation 04/22/2016  2:59 PM  Site / Wound Assessment Yellow;Pink 04/22/2016  2:59 PM  % Wound base Red or Granulating 5% 04/22/2016  2:59 PM  % Wound base Yellow 95% 04/22/2016  2:59 PM  % Wound base Black 0% 04/22/2016  2:59 PM  % Wound base Other (Comment) 0% 04/22/2016  2:59 PM  Peri-wound Assessment Intact 04/22/2016  2:59 PM  Wound Length (cm) 1 cm 04/17/2016  4:47 PM  Wound Width (cm) 1.5 cm 04/17/2016  4:47 PM  Wound Depth (cm) 0.1 cm 04/17/2016  4:47 PM  Margins Unattached edges (unapproximated) 04/22/2016  2:59 PM  Drainage Amount Scant 04/22/2016  2:59 PM  Drainage Description Serous 04/22/2016  2:59 PM  Treatment Hydrotherapy (Pulse lavage) 04/21/2016 10:15 PM     Pressure Injury 04/15/16 Unstageable - Full thickness tissue loss in which the base of the ulcer is covered by slough (yellow, tan, gray, green or brown) and/or eschar (tan, brown or black) in the wound bed. yellow and off white eschar (Active)  Dressing Type Gauze (Comment);Barrier Film (skin prep);Foam;Moist to dry 04/22/2016  2:59 PM   Dressing Changed;Clean;Dry;Intact 04/22/2016  2:59 PM  Dressing Change Frequency Daily 04/22/2016  2:59 PM  State of Healing Eschar 04/22/2016  2:59 PM  Site / Wound Assessment Yellow;Red 04/22/2016  2:59 PM  % Wound base Red or Granulating 35% 04/22/2016  2:59 PM  % Wound base Yellow 60% 04/22/2016  2:59 PM  % Wound base Black 5% 04/22/2016  2:59 PM  % Wound base Other (Comment) 0% 04/22/2016  2:59 PM  Peri-wound Assessment Intact 04/22/2016  2:59 PM  Wound Length (cm) 3 cm 04/17/2016  4:47 PM  Wound Width (cm) 5 cm 04/17/2016  4:47 PM  Wound Depth (cm) 0.1 cm 04/17/2016  4:47 PM  Margins Unattached edges (unapproximated) 04/22/2016  2:59 PM  Drainage Amount Minimal 04/22/2016  2:59 PM  Drainage Description Serosanguineous 04/22/2016  2:59 PM  Treatment Hydrotherapy (Pulse lavage) 04/21/2016 10:15 PM     Pressure Injury 04/15/16 Stage II -  Partial thickness loss of dermis presenting as a shallow open ulcer with a red, pink wound bed without slough. these are full thickness wounds, NOT pressure injuries (Active)  Dressing Type Foam 04/22/2016  9:17 AM  Dressing Changed 04/21/2016  3:05 AM  Dressing Change Frequency PRN 04/21/2016  3:05 AM  Site / Wound Assessment Dressing in place / Unable to assess 04/16/2016 10:52 AM  % Wound base Red or Granulating 20% 04/15/2016 10:46 PM  % Wound base Yellow 75% 04/15/2016 10:46 PM  % Wound base Black 0% 04/15/2016 10:46 PM  % Wound base Other (Comment) 5% 04/15/2016 10:46 PM  Peri-wound  Assessment Intact 04/15/2016 10:46 PM  Margins Attached edges (approximated) 04/20/2016  9:25 AM  Drainage Amount Moderate 04/20/2016  9:25 AM  Drainage Description Serosanguineous 04/20/2016  9:25 AM     Pressure Injury 04/16/16 Unstageable - Full thickness tissue loss in which the base of the ulcer is covered by slough (yellow, tan, gray, green or brown) and/or eschar (tan, brown or black) in the wound bed. eschar (Active)  Dressing Type Foam;Barrier Film  (skin prep);Gauze (Comment);Moist to dry 04/22/2016  2:59 PM  Dressing Changed;Clean;Dry;Intact 04/22/2016  2:59 PM  Dressing Change Frequency Daily 04/22/2016  2:59 PM  State of Healing Eschar 04/22/2016  2:59 PM  Site / Wound Assessment Yellow;Red 04/22/2016  2:59 PM  % Wound base Red or Granulating 80% 04/22/2016  2:59 PM  % Wound base Yellow 20% 04/22/2016  2:59 PM  % Wound base Black 0% 04/22/2016  2:59 PM  % Wound base Other (Comment) 0% 04/22/2016  2:59 PM  Peri-wound Assessment Erythema (blanchable) 04/22/2016  2:59 PM  Wound Length (cm) 3.5 cm 04/17/2016  4:47 PM  Wound Width (cm) 2.3 cm 04/17/2016  4:47 PM  Wound Depth (cm) 0.2 cm 04/17/2016  4:47 PM  Margins Unattached edges (unapproximated) 04/22/2016  2:59 PM  Drainage Amount Minimal 04/22/2016  2:59 PM  Drainage Description Serosanguineous 04/22/2016  2:59 PM  Treatment Hydrotherapy (Pulse lavage) 04/21/2016 10:15 PM     Pressure Injury 04/16/16 Unstageable - Full thickness tissue loss in which the base of the ulcer is covered by slough (yellow, tan, gray, green or brown) and/or eschar (tan, brown or black) in the wound bed. (Active)  Dressing Type Foam;Barrier Film (skin prep);Gauze (Comment);Moist to dry 04/22/2016  2:59 PM  Dressing Changed;Clean;Dry;Intact 04/22/2016  2:59 PM  Dressing Change Frequency Daily 04/22/2016  2:59 PM  State of Healing Eschar 04/22/2016  2:59 PM  Site / Wound Assessment Red;Yellow;Bleeding 04/22/2016  2:59 PM  % Wound base Red or Granulating 30% 04/22/2016  2:59 PM  % Wound base Yellow 70% 04/22/2016  2:59 PM  % Wound base Black 0% 04/22/2016  2:59 PM  % Wound base Other (Comment) 0% 04/22/2016  2:59 PM  Peri-wound Assessment Intact 04/22/2016  2:59 PM  Wound Length (cm) 7 cm 04/17/2016  4:47 PM  Wound Width (cm) 5 cm 04/17/2016  4:47 PM  Wound Depth (cm) 0.1 cm 04/17/2016  4:47 PM  Margins Unattached edges (unapproximated) 04/22/2016  2:59 PM  Drainage Amount Minimal 04/22/2016  2:59 PM   Drainage Description Serosanguineous 04/22/2016  2:59 PM  Treatment Hydrotherapy (Pulse lavage) 04/21/2016 10:15 PM     Pressure Injury 04/15/16 Stage IV - Full thickness tissue loss with exposed bone, tendon or muscle. (Active)  Dressing Type Foam;Barrier Film (skin prep);Moist to dry;Gauze (Comment) 04/22/2016  2:59 PM  Dressing Changed;Clean;Dry;Intact 04/22/2016  2:59 PM  Dressing Change Frequency Daily 04/22/2016  2:59 PM  State of Healing Eschar 04/22/2016  2:59 PM  Site / Wound Assessment Red;Yellow 04/22/2016  2:59 PM  % Wound base Red or Granulating 90% 04/22/2016  2:59 PM  % Wound base Yellow 10% 04/22/2016  2:59 PM  % Wound base Black 0% 04/22/2016  2:59 PM  % Wound base Other (Comment) 0% 04/22/2016  2:59 PM  Peri-wound Assessment Erythema (blanchable) 04/22/2016  2:59 PM  Wound Length (cm) 4 cm 04/17/2016  4:47 PM  Wound Width (cm) 2 cm 04/17/2016  4:47 PM  Wound Depth (cm) 0.2 cm 04/17/2016  4:47 PM  Margins Unattached edges (unapproximated) 04/22/2016  2:59 PM  Drainage Amount Minimal 04/22/2016  2:59 PM  Drainage Description Serosanguineous 04/22/2016  2:59 PM  Treatment Hydrotherapy (Pulse lavage) 04/21/2016 10:15 PM     Wound / Incision (Open or Dehisced) 04/15/16 Other (Comment) Knee Right (Active)  Dressing Type Foam 04/22/2016  9:17 AM  Dressing Changed Changed 04/21/2016  3:05 AM  Dressing Status Clean;Dry;Intact 04/20/2016  9:25 AM  % Wound base Red or Granulating 100% 04/16/2016  6:02 AM  .Santyl applied to wound bed prior to applying dressing.  Hydrotherapy Pulsed lavage therapy - wound location: sacrum, buttocks, rt posterior calf Pulsed Lavage with Suction (psi): 8 psi (4-8) Pulsed Lavage with Suction - Normal Saline Used: 1000 mL Pulsed Lavage Tip: Tip with splash shield Selective Debridement Selective Debridement - Location: R buttock , R calf, coccyx Selective Debridement - Tools Used: Forceps;Scalpel Selective Debridement - Tissue Removed: yellow  necrotic   Wound Assessment and Plan  Wound Therapy - Assess/Plan/Recommendations Wound Therapy - Clinical Statement: Wounds stable with continued work to remove necrotic tissue.  Wound Therapy - Functional Problem List: Decr sitting tolerance Factors Delaying/Impairing Wound Healing: Immobility;Multiple medical problems;Altered sensation Hydrotherapy Plan: Debridement;Dressing change;Patient/family education;Pulsatile lavage with suction Wound Therapy - Frequency: 6X / week Wound Therapy - Follow Up Recommendations: Skilled nursing facility (Dressing changes.) Wound Plan: See above  Wound Therapy Goals- Improve the function of patient's integumentary system by progressing the wound(s) through the phases of wound healing (inflammation - proliferation - remodeling) by: Decrease Necrotic Tissue to: 25 (overall) Decrease Necrotic Tissue - Progress: Progressing toward goal Increase Granulation Tissue to: 75 (overall) Increase Granulation Tissue - Progress: Progressing toward goal Goals/treatment plan/discharge plan were made with and agreed upon by patient/family: Yes Time For Goal Achievement: 7 days Wound Therapy - Potential for Goals: Fair  Goals will be updated until maximal potential achieved or discharge criteria met.  Discharge criteria: when goals achieved, discharge from hospital, MD decision/surgical intervention, no progress towards goals, refusal/missing three consecutive treatments without notification or medical reason.  GP     Vendela Troung, Tessie Fass 04/22/2016, 3:11 PM 04/22/2016  Donnella Sham, [place] [phone]  (pager)
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
## 2016-04-23 DIAGNOSIS — G8220 Paraplegia, unspecified: Secondary | ICD-10-CM

## 2016-04-23 DIAGNOSIS — Z833 Family history of diabetes mellitus: Secondary | ICD-10-CM

## 2016-04-23 DIAGNOSIS — Z8249 Family history of ischemic heart disease and other diseases of the circulatory system: Secondary | ICD-10-CM

## 2016-04-23 DIAGNOSIS — N319 Neuromuscular dysfunction of bladder, unspecified: Secondary | ICD-10-CM

## 2016-04-23 DIAGNOSIS — L89894 Pressure ulcer of other site, stage 4: Secondary | ICD-10-CM

## 2016-04-23 DIAGNOSIS — A419 Sepsis, unspecified organism: Secondary | ICD-10-CM

## 2016-04-23 DIAGNOSIS — J189 Pneumonia, unspecified organism: Secondary | ICD-10-CM

## 2016-04-23 DIAGNOSIS — Z87891 Personal history of nicotine dependence: Secondary | ICD-10-CM

## 2016-04-23 DIAGNOSIS — L89304 Pressure ulcer of unspecified buttock, stage 4: Secondary | ICD-10-CM

## 2016-04-23 DIAGNOSIS — S31000D Unspecified open wound of lower back and pelvis without penetration into retroperitoneum, subsequent encounter: Secondary | ICD-10-CM

## 2016-04-23 DIAGNOSIS — F333 Major depressive disorder, recurrent, severe with psychotic symptoms: Secondary | ICD-10-CM

## 2016-04-23 DIAGNOSIS — Z803 Family history of malignant neoplasm of breast: Secondary | ICD-10-CM

## 2016-04-23 DIAGNOSIS — Z79899 Other long term (current) drug therapy: Secondary | ICD-10-CM

## 2016-04-23 LAB — CBC
HCT: 27.7 % — ABNORMAL LOW (ref 39.0–52.0)
Hemoglobin: 8.7 g/dL — ABNORMAL LOW (ref 13.0–17.0)
MCH: 26.4 pg (ref 26.0–34.0)
MCHC: 31.4 g/dL (ref 30.0–36.0)
MCV: 83.9 fL (ref 78.0–100.0)
PLATELETS: 520 10*3/uL — AB (ref 150–400)
RBC: 3.3 MIL/uL — ABNORMAL LOW (ref 4.22–5.81)
RDW: 15.6 % — AB (ref 11.5–15.5)
WBC: 7.4 10*3/uL (ref 4.0–10.5)

## 2016-04-23 LAB — BASIC METABOLIC PANEL
Anion gap: 10 (ref 5–15)
BUN: 7 mg/dL (ref 6–20)
CALCIUM: 8.9 mg/dL (ref 8.9–10.3)
CO2: 27 mmol/L (ref 22–32)
CREATININE: 0.49 mg/dL — AB (ref 0.61–1.24)
Chloride: 96 mmol/L — ABNORMAL LOW (ref 101–111)
GFR calc Af Amer: >60 mL/min (ref >60)
GLUCOSE: 98 mg/dL (ref 65–99)
Potassium: 3.1 mmol/L — ABNORMAL LOW (ref 3.5–5.1)
Sodium: 133 mmol/L — ABNORMAL LOW (ref 135–145)

## 2016-04-23 LAB — VANCOMYCIN, TROUGH: Vancomycin Tr: 30 ug/mL (ref 15–20)

## 2016-04-23 MED ORDER — MORPHINE SULFATE (PF) 2 MG/ML IV SOLN
2.0000 mg | Freq: Once | INTRAVENOUS | Status: AC
  Administered 2016-04-23: 2 mg via INTRAVENOUS
  Filled 2016-04-23: qty 1

## 2016-04-23 MED ORDER — PRO-STAT SUGAR FREE PO LIQD
30.0000 mL | Freq: Two times a day (BID) | ORAL | 0 refills | Status: DC
Start: 2016-04-23 — End: 2016-07-26

## 2016-04-23 MED ORDER — SODIUM CHLORIDE 0.9% FLUSH: 10.0000 mL | INTRAVENOUS | Status: DC | PRN

## 2016-04-23 MED ORDER — ADULT MULTIVITAMIN W/MINERALS CH: 1.0000 | ORAL_TABLET | Freq: Every day | ORAL | Status: DC

## 2016-04-23 MED ORDER — DEXTROSE 5 % IV SOLN: 1.0000 g | INTRAVENOUS | Status: AC

## 2016-04-23 MED ORDER — OXYCODONE HCL 10 MG/0.5ML PO CONC: 0.5000 mL | Freq: Four times a day (QID) | ORAL | 0 refills | Status: DC

## 2016-04-23 MED ORDER — LORAZEPAM 2 MG/ML PO CONC: 1.0000 mg | ORAL | 0 refills | Status: DC | PRN

## 2016-04-23 MED ORDER — VANCOMYCIN HCL IN DEXTROSE 1-5 GM/200ML-% IV SOLN
1000.0000 mg | Freq: Two times a day (BID) | INTRAVENOUS | Status: DC
  Administered 2016-04-23 – 2016-04-24 (×2): 1000 mg via INTRAVENOUS
  Filled 2016-04-23 (×3): qty 200

## 2016-04-23 MED ORDER — ACETAMINOPHEN 325 MG PO TABS: 650.0000 mg | ORAL_TABLET | Freq: Four times a day (QID) | ORAL | Status: DC | PRN

## 2016-04-23 MED ORDER — BOOST / RESOURCE BREEZE PO LIQD: 1.0000 | Freq: Three times a day (TID) | ORAL | 0 refills | Status: DC

## 2016-04-23 MED ORDER — VANCOMYCIN HCL IN DEXTROSE 1-5 GM/200ML-% IV SOLN: 1000.0000 mg | Freq: Three times a day (TID) | INTRAVENOUS | Status: DC

## 2016-04-23 MED ORDER — COLLAGENASE 250 UNIT/GM EX OINT: TOPICAL_OINTMENT | CUTANEOUS | 0 refills | Status: AC

## 2016-04-23 MED ORDER — OXYCODONE HCL 10 MG/0.5ML PO CONC: 0.5000 mL | ORAL | 0 refills | Status: DC | PRN

## 2016-04-23 NOTE — Progress Notes (Signed)
Peripherally Inserted Central Catheter/Midline Placement  The IV Nurse has discussed with the patient and/or persons authorized to consent for the patient, the purpose of this procedure and the potential benefits and risks involved with this procedure.  The benefits include less needle sticks, lab draws from the catheter, and the patient may be discharged home with the catheter. Risks include, but not limited to, infection, bleeding, blood clot (thrombus formation), and puncture of an artery; nerve damage and irregular heartbeat and possibility to perform a PICC exchange if needed/ordered by physician.  Alternatives to this procedure were also discussed.  Bard Power PICC patient education guide, fact sheet on infection prevention and patient information card has been provided to patient /or left at bedside.    PICC/Midline Placement Documentation  PICC Single Lumen 04/23/16 PICC Right Brachial 37 cm 1 cm (Active)  Indication for Insertion or Continuance of Line Home intravenous therapies (PICC only) 04/23/2016  8:00 AM  Exposed Catheter (cm) 1 cm 04/23/2016  8:00 AM  Dressing Change Due 04/30/16 04/23/2016  8:00 AM       Jule Economy Horton 04/23/2016, 8:36 AM

## 2016-04-23 NOTE — Progress Notes (Signed)
Physical Therapy Wound Treatment Patient Details  Name: Joshua Park MRN: [id number] Date of Birth: [birth year]  Today's Date: 04/23/2016 Time: 2440-1027 Time Calculation (min): 54 min  Subjective  Subjective: Man you scrubbing it off, aren't you... Patient and Family Stated Goals: not stated Date of Onset:  (Prior to hospitalization) Prior Treatments: Unsure  Pain Score: Pain Score: 3   Wound Assessment  Pressure Injury 04/15/16 Unstageable - Full thickness tissue loss in which the base of the ulcer is covered by slough (yellow, tan, gray, green or brown) and/or eschar (tan, brown or black) in the wound bed. yellow eschar  (Active)  Dressing Type Foam;Moist to dry;Gauze (Comment);Barrier Film (skin prep) 04/23/2016  3:11 PM  Dressing Changed;Clean;Dry;Intact 04/23/2016  3:11 PM  Dressing Change Frequency Daily 04/23/2016  3:11 PM  State of Healing Early/partial granulation 04/23/2016  3:11 PM  Site / Wound Assessment Yellow;Pink 04/23/2016  3:11 PM  % Wound base Red or Granulating 30% 04/23/2016  3:11 PM  % Wound base Yellow 70% 04/23/2016  3:11 PM  % Wound base Black 0% 04/23/2016  3:11 PM  % Wound base Other (Comment) 0% 04/23/2016  3:11 PM  Peri-wound Assessment Intact 04/23/2016  3:11 PM  Wound Length (cm) 2.9 cm 04/23/2016  3:11 PM  Wound Width (cm) 3.5 cm 04/23/2016  3:11 PM  Wound Depth (cm) 0.8 cm 04/23/2016  3:11 PM  Margins Unattached edges (unapproximated) 04/23/2016  3:11 PM  Drainage Amount Moderate 04/23/2016  3:11 PM  Drainage Description Serosanguineous 04/23/2016  3:11 PM  Treatment Cleansed;Debridement (Selective);Hydrotherapy (Pulse lavage);Packing (Plain strip);Other (Comment) 04/23/2016  3:11 PM     Pressure Injury 04/15/16 Unstageable - Full thickness tissue loss in which the base of the ulcer is covered by slough (yellow, tan, gray, green or brown) and/or eschar (tan, brown or black) in the wound bed. yellow and off white eschar------CoCCyx (Active)   Dressing Type Gauze (Comment);Barrier Film (skin prep);Foam;Moist to dry 04/23/2016  3:11 PM  Dressing Changed;Clean;Dry;Intact 04/23/2016  3:11 PM  Dressing Change Frequency Daily 04/23/2016  3:11 PM  State of Healing Eschar 04/23/2016  3:11 PM  Site / Wound Assessment Yellow;Red 04/23/2016  3:11 PM  % Wound base Red or Granulating 60% 04/23/2016  3:11 PM  % Wound base Yellow 40% 04/23/2016  3:11 PM  % Wound base Black 5% 04/23/2016  3:11 PM  % Wound base Other (Comment) 0% 04/23/2016  3:11 PM  Peri-wound Assessment Intact 04/23/2016  3:11 PM  Wound Length (cm) 2.5 cm 04/23/2016  3:11 PM  Wound Width (cm) 1.7 cm 04/23/2016  3:11 PM  Wound Depth (cm) 0.6 cm 04/23/2016  3:11 PM  Margins Unattached edges (unapproximated) 04/23/2016  3:11 PM  Drainage Amount Minimal 04/23/2016  3:11 PM  Drainage Description Serosanguineous 04/23/2016  3:11 PM  Treatment Hydrotherapy (Pulse lavage) 04/21/2016 10:15 PM     Pressure Injury 04/15/16 Stage II -  Partial thickness loss of dermis presenting as a shallow open ulcer with a red, pink wound bed without slough. these are full thickness wounds, NOT pressure injuries (Active)  Dressing Type Foam 04/22/2016  9:17 AM  Dressing Changed 04/21/2016  3:05 AM  Dressing Change Frequency PRN 04/21/2016  3:05 AM  Site / Wound Assessment Dressing in place / Unable to assess 04/16/2016 10:52 AM  % Wound base Red or Granulating 20% 04/15/2016 10:46 PM  % Wound base Yellow 75% 04/15/2016 10:46 PM  % Wound base Black 0% 04/15/2016 10:46 PM  % Wound base Other (Comment) 5%  04/15/2016 10:46 PM  Peri-wound Assessment Intact 04/15/2016 10:46 PM  Margins Attached edges (approximated) 04/20/2016  9:25 AM  Drainage Amount Moderate 04/20/2016  9:25 AM  Drainage Description Serosanguineous 04/20/2016  9:25 AM     Pressure Injury 04/16/16 Unstageable - Full thickness tissue loss in which the base of the ulcer is covered by slough (yellow, tan, gray, green or brown) and/or  eschar (tan, brown or black) in the wound bed. eschar (Active)  Dressing Type Foam;Barrier Film (skin prep);Gauze (Comment);Moist to dry 04/23/2016  3:11 PM  Dressing Changed;Clean;Dry;Intact 04/23/2016  3:11 PM  Dressing Change Frequency Daily 04/23/2016  3:11 PM  State of Healing Eschar 04/23/2016  3:11 PM  Site / Wound Assessment Yellow;Red 04/23/2016  3:11 PM  % Wound base Red or Granulating 80% 04/23/2016  3:11 PM  % Wound base Yellow 20% 04/23/2016  3:11 PM  % Wound base Black 0% 04/23/2016  3:11 PM  % Wound base Other (Comment) 0% 04/23/2016  3:11 PM  Peri-wound Assessment Erythema (blanchable) 04/23/2016  3:11 PM  Wound Length (cm) 3 cm 04/23/2016  3:11 PM  Wound Width (cm) 2.2 cm 04/23/2016  3:11 PM  Wound Depth (cm) 0.7 cm 04/23/2016  3:11 PM  Undermining (cm) 1.0 04/23/2016  3:11 PM  Margins Unattached edges (unapproximated) 04/23/2016  3:11 PM  Drainage Amount Minimal 04/23/2016  3:11 PM  Drainage Description Serosanguineous 04/23/2016  3:11 PM  Treatment Cleansed;Debridement (Selective);Hydrotherapy (Pulse lavage);Packing (Saline gauze) 04/23/2016  3:11 PM     Pressure Injury 04/16/16 Unstageable - Full thickness tissue loss in which the base of the ulcer is covered by slough (yellow, tan, gray, green or brown) and/or eschar (tan, brown or black) in the wound bed. (Active)  Dressing Type Foam;Barrier Film (skin prep);Gauze (Comment);Moist to dry 04/23/2016  3:11 PM  Dressing Changed;Clean;Dry;Intact 04/23/2016  3:11 PM  Dressing Change Frequency Daily 04/23/2016  3:11 PM  State of Healing Eschar 04/23/2016  3:11 PM  Site / Wound Assessment Red;Yellow;Bleeding 04/23/2016  3:11 PM  % Wound base Red or Granulating 45% 04/23/2016  3:11 PM  % Wound base Yellow 55% 04/23/2016  3:11 PM  % Wound base Black 0% 04/23/2016  3:11 PM  % Wound base Other (Comment) 0% 04/23/2016  3:11 PM  Peri-wound Assessment Intact 04/23/2016  3:11 PM  Wound Length (cm) 3.3 cm 04/23/2016  3:11 PM   Wound Width (cm) 2.5 cm 04/23/2016  3:11 PM  Wound Depth (cm) 0.09 cm 04/23/2016  3:11 PM  Undermining (cm) .6 04/23/2016  3:11 PM  Margins Unattached edges (unapproximated) 04/23/2016  3:11 PM  Drainage Amount Moderate 04/23/2016  3:11 PM  Drainage Description Serosanguineous 04/23/2016  3:11 PM  Treatment Cleansed;Debridement (Selective);Hydrotherapy (Pulse lavage);Packing (Saline gauze) 04/23/2016  3:11 PM     Pressure Injury 04/15/16 Stage IV - Full thickness tissue loss with exposed bone, tendon or muscle. (Active)  Dressing Type Foam;Barrier Film (skin prep);Moist to dry;Gauze (Comment) 04/23/2016  3:11 PM  Dressing Changed;Clean;Dry;Intact 04/23/2016  3:11 PM  Dressing Change Frequency Daily 04/23/2016  3:11 PM  State of Healing Eschar 04/23/2016  3:11 PM  Site / Wound Assessment Red;Yellow 04/23/2016  3:11 PM  % Wound base Red or Granulating 90% 04/23/2016  3:11 PM  % Wound base Yellow 10% 04/23/2016  3:11 PM  % Wound base Black 0% 04/23/2016  3:11 PM  % Wound base Other (Comment) 0% 04/23/2016  3:11 PM  Peri-wound Assessment Erythema (blanchable) 04/23/2016  3:11 PM  Wound Length (cm) 4.3 cm 04/23/2016  3:11 PM  Wound Width (cm) 3 cm 04/23/2016  3:11 PM  Wound Depth (cm) 1.5 cm 04/23/2016  3:11 PM  Undermining (cm) 1.3 04/23/2016  3:11 PM  Margins Unattached edges (unapproximated) 04/23/2016  3:11 PM  Drainage Amount Minimal 04/23/2016  3:11 PM  Drainage Description Serosanguineous 04/23/2016  3:11 PM  Treatment Cleansed;Debridement (Selective);Hydrotherapy (Pulse lavage) 04/23/2016  3:11 PM     Wound / Incision (Open or Dehisced) 04/15/16 Other (Comment) Knee Right (Active)  Dressing Type Foam 04/22/2016  9:17 AM  Dressing Changed Changed 04/21/2016  3:05 AM  Dressing Status Clean;Dry;Intact 04/20/2016  9:25 AM  % Wound base Red or Granulating 100% 04/16/2016  6:02 AM   Hydrotherapy Pulsed lavage therapy - wound location: sacrum, buttocks, rt posterior calf Pulsed  Lavage with Suction (psi): 8 psi (4-8) Pulsed Lavage with Suction - Normal Saline Used: 1000 mL Pulsed Lavage Tip: Tip with splash shield Selective Debridement Selective Debridement - Location: R buttock , R calf, coccyx Selective Debridement - Tools Used: Forceps;Scalpel Selective Debridement - Tissue Removed: yellow necrotic   Wound Assessment and Plan  Wound Therapy - Assess/Plan/Recommendations Wound Therapy - Clinical Statement: Wounds stable with continued work to remove necrotic tissue.  Wound Therapy - Functional Problem List: Decr sitting tolerance Factors Delaying/Impairing Wound Healing: Immobility;Multiple medical problems;Altered sensation Hydrotherapy Plan: Debridement;Dressing change;Patient/family education;Pulsatile lavage with suction Wound Therapy - Frequency: 6X / week Wound Therapy - Follow Up Recommendations: Skilled nursing facility (Dressing changes.) Wound Plan: See above  Wound Therapy Goals- Improve the function of patient's integumentary system by progressing the wound(s) through the phases of wound healing (inflammation - proliferation - remodeling) by: Decrease Necrotic Tissue to: 25 (overall) Decrease Necrotic Tissue - Progress: Progressing toward goal Increase Granulation Tissue to: 75 (overall) Increase Granulation Tissue - Progress: Progressing toward goal Goals/treatment plan/discharge plan were made with and agreed upon by patient/family: Yes Time For Goal Achievement: 7 days Wound Therapy - Potential for Goals: Fair  Goals will be updated until maximal potential achieved or discharge criteria met.  Discharge criteria: when goals achieved, discharge from hospital, MD decision/surgical intervention, no progress towards goals, refusal/missing three consecutive treatments without notification or medical reason.  GP     Joshua Park, Joshua Park 04/23/2016, 5:08 PM  04/23/2016  Joshua Park, PT [phone]  (pager)

## 2016-04-23 NOTE — Progress Notes (Signed)
Pharmacy Antibiotic Note  Joshua Park is a 45 y.o. male admitted on 04/15/2016 with osteomyelitis. Pharmacy has been consulted for vancomycin dosing - Day #4. Patient has history of paraplegia, SCr at baseline and stable, 0.47, good UOP. Afebrile, wbc up 14.5. Plastic Surgery consulted per MD note.  Patient is also on day 6/7 of ceftriaxone and azithromycin PO for CAP, providencia stuartii UTI.  Vancomycin 750mg  IV every 8 hours. VT 14 , goal 15-20 Dose wa change vancomycin 1gm q8 and VT 30 - drawn by lab prior to dose given per RN  Plan: Vancomycin 1Gm IV every12 hours. Goal trough 15-20 mcg/mL Rocephin 1g IV Q24H per MD Azithromycin 500mg  PO Q24H per MD Monitor culture data, renal function, and clinical course   Height: 5\' 5"  (165.1 cm) Weight: 144 lb (65.3 kg) IBW/kg (Calculated) : 61.5  Temp (24hrs), Avg:98.5 F (36.9 C), Min:98.1 F (36.7 C), Max:98.9 F (37.2 C)   Recent Labs Lab 04/19/16 0533 04/20/16 0419 04/21/16 0421 04/21/16 2052 04/22/16 0254 04/23/16 0556 04/23/16 2042  WBC 8.2 7.0 14.5*  --  9.5 7.4  --   CREATININE 0.41* 0.39* 0.47*  --  0.49* 0.49*  --   VANCOTROUGH  --   --   --  14*  --   --  30*    Estimated Creatinine Clearance: 102.5 mL/min (by C-G formula based on SCr of 0.49 mg/dL (L)).    No Known Allergies  Antimicrobials this admission: 10/11 Rocephin>>  10/11 Azithromycin PO>>  10/13 Vanc >>  Dose adjustments this admission: Vancomycin 750mg  q8> 1gm q8> 1gm q12  Microbiology results: 10/10 BCx: neg 10/11 Urine Cx: providencia stuartii (S-ctx, imi, zosyn, bactrim) 10/11 MRSA PCR: positive on [email or web address].D. CPP, BCPS Clinical Pharmacist [phone] 04/23/2016 10:49 PM
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
## 2016-04-23 NOTE — Progress Notes (Signed)
CRITICAL VALUE ALERT  Critical value received:  Vanc trough 30  Date of notification:  04/23/16  Time of notification:  10:28 PM  Critical value read back:Yes.    Nurse who received alert:  Riki Altes. RN  MD notified (1st page):  [facility]  Time of first page:  2230  MD notified (2nd page):  Time of second page:  Responding MD:   Time MD responded:

## 2016-04-23 NOTE — Clinical Social Work Note (Signed)
CSW facilitated patient discharge including contacting facility to confirm patient discharge plans. Patient declined having CSW call family to make them aware of discharge. Clinical information faxed to facility and family agreeable with plan. CSW arranged ambulance transport via [facility] to [facility]. RN to call report prior to discharge [phone]).  CSW will sign off for now as social work intervention is no longer needed. Please consult [place] again if new needs arise.  Joshua Park, Joshua Park

## 2016-04-23 NOTE — Progress Notes (Addendum)
Pt has orders to be discharged to [facility]. Report called to [facility]. [place] stated that pt cannot come back till 2130 vancomycin dose has been given because they do not have that antibiotic available to give pt till tomorrow 04/24/16. Paged Dr. Dyann Kief to inform him pt cannot be discharged till tomorrow. Will continue to monitor.  Maurene Capes RN
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
## 2016-04-24 ENCOUNTER — Encounter: Payer: Self-pay | Admitting: Adult Health

## 2016-04-24 ENCOUNTER — Non-Acute Institutional Stay (SKILLED_NURSING_FACILITY): Payer: Medicaid Other | Admitting: Adult Health

## 2016-04-24 ENCOUNTER — Other Ambulatory Visit: Payer: Self-pay

## 2016-04-24 DIAGNOSIS — N319 Neuromuscular dysfunction of bladder, unspecified: Secondary | ICD-10-CM

## 2016-04-24 DIAGNOSIS — F333 Major depressive disorder, recurrent, severe with psychotic symptoms: Secondary | ICD-10-CM

## 2016-04-24 DIAGNOSIS — L89894 Pressure ulcer of other site, stage 4: Secondary | ICD-10-CM

## 2016-04-24 DIAGNOSIS — M86172 Other acute osteomyelitis, left ankle and foot: Secondary | ICD-10-CM | POA: Diagnosis not present

## 2016-04-24 DIAGNOSIS — J189 Pneumonia, unspecified organism: Secondary | ICD-10-CM | POA: Diagnosis not present

## 2016-04-24 DIAGNOSIS — G894 Chronic pain syndrome: Secondary | ICD-10-CM

## 2016-04-24 DIAGNOSIS — G839 Paralytic syndrome, unspecified: Secondary | ICD-10-CM | POA: Diagnosis not present

## 2016-04-24 DIAGNOSIS — E43 Unspecified severe protein-calorie malnutrition: Secondary | ICD-10-CM

## 2016-04-24 DIAGNOSIS — R627 Adult failure to thrive: Secondary | ICD-10-CM

## 2016-04-24 LAB — BASIC METABOLIC PANEL
Anion gap: 9 (ref 5–15)
BUN: 6 mg/dL (ref 6–20)
CALCIUM: 9 mg/dL (ref 8.9–10.3)
CO2: 27 mmol/L (ref 22–32)
CREATININE: 0.38 mg/dL — AB (ref 0.61–1.24)
Chloride: 100 mmol/L — ABNORMAL LOW (ref 101–111)
GLUCOSE: 143 mg/dL — AB (ref 65–99)
Potassium: 3.5 mmol/L (ref 3.5–5.1)
Sodium: 136 mmol/L (ref 135–145)

## 2016-04-24 MED ORDER — HEPARIN SOD (PORK) LOCK FLUSH 100 UNIT/ML IV SOLN
250.0000 [IU] | INTRAVENOUS | Status: AC | PRN
  Administered 2016-04-24: 250 [IU]

## 2016-04-24 MED ORDER — VANCOMYCIN HCL IN DEXTROSE 1-5 GM/200ML-% IV SOLN: 1000.0000 mg | Freq: Two times a day (BID) | INTRAVENOUS | Status: AC

## 2016-04-24 MED ORDER — UNABLE TO FIND: 5 refills | Status: DC

## 2016-04-24 MED ORDER — OXYCODONE HCL 10 MG/0.5ML PO CONC: 0.5000 mL | Freq: Four times a day (QID) | ORAL | 0 refills | Status: DC
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
## 2016-04-24 NOTE — Clinical Social Work Note (Signed)
CSW facilitated patient discharge including contacting patient family and facility to confirm patient discharge plans. Clinical information faxed to facility and family agreeable with plan. CSW arranged ambulance transport via [facility] to [facility] around 11:30 pm. RN to call report prior to discharge [phone]).  CSW will sign off for now as social work intervention is no longer needed. Please consult [place] again if new needs arise.  Dayton Scrape, [place]

## 2016-04-24 NOTE — Telephone Encounter (Signed)
RX faxed to [facility] @ [phone], phone number [phone] 

## 2016-04-24 NOTE — Progress Notes (Signed)
Pt has orders to be discharged back to [facility]. All morning medications administered in addition to Vancomycin IVPB and Rocephin IVPB. Report called to [facility]. Pt stable and waiting for transportation.   Maurene Capes RN

## 2016-04-24 NOTE — Progress Notes (Signed)
Patient seen and examined. Stable and in no distress. Discharge summary reviewed and updated. Was not discharge yesterday due to elevated vancomycin level and need for medication adjustments. Stable and ready for discharge today.  Barton Dubois [id number]

## 2016-04-25 ENCOUNTER — Other Ambulatory Visit: Payer: Self-pay | Admitting: *Deleted

## 2016-04-25 MED ORDER — LORAZEPAM 2 MG/ML PO CONC: ORAL | 0 refills | Status: DC

## 2016-04-25 MED ORDER — OXYCODONE HCL 10 MG/0.5ML PO CONC: 0.5000 mL | Freq: Four times a day (QID) | ORAL | 0 refills | Status: DC

## 2016-04-25 NOTE — Telephone Encounter (Signed)
[facility] #: [phone] Fax#: [phone]  

## 2016-04-29 ENCOUNTER — Encounter: Payer: Self-pay | Admitting: Internal Medicine

## 2016-04-29 ENCOUNTER — Non-Acute Institutional Stay (SKILLED_NURSING_FACILITY): Payer: Medicaid Other | Admitting: Internal Medicine

## 2016-04-29 DIAGNOSIS — R627 Adult failure to thrive: Secondary | ICD-10-CM

## 2016-04-29 DIAGNOSIS — Z9289 Personal history of other medical treatment: Secondary | ICD-10-CM | POA: Diagnosis not present

## 2016-04-29 DIAGNOSIS — E43 Unspecified severe protein-calorie malnutrition: Secondary | ICD-10-CM

## 2016-04-29 DIAGNOSIS — G894 Chronic pain syndrome: Secondary | ICD-10-CM | POA: Diagnosis not present

## 2016-04-29 DIAGNOSIS — L89304 Pressure ulcer of unspecified buttock, stage 4: Secondary | ICD-10-CM

## 2016-04-29 DIAGNOSIS — K5901 Slow transit constipation: Secondary | ICD-10-CM

## 2016-04-29 DIAGNOSIS — F333 Major depressive disorder, recurrent, severe with psychotic symptoms: Secondary | ICD-10-CM | POA: Diagnosis not present

## 2016-04-29 DIAGNOSIS — M4628 Osteomyelitis of vertebra, sacral and sacrococcygeal region: Secondary | ICD-10-CM | POA: Diagnosis not present

## 2016-04-29 DIAGNOSIS — Z978 Presence of other specified devices: Secondary | ICD-10-CM

## 2016-04-29 DIAGNOSIS — Z960 Presence of urogenital implants: Secondary | ICD-10-CM

## 2016-04-29 DIAGNOSIS — G839 Paralytic syndrome, unspecified: Secondary | ICD-10-CM | POA: Diagnosis not present
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
## 2016-05-01 LAB — BASIC METABOLIC PANEL
BUN: 6 mg/dL (ref 4–21)
Creatinine: 0.3 mg/dL — AB (ref 0.6–1.3)
Glucose: 94 mg/dL
POTASSIUM: 3.6 mmol/L (ref 3.4–5.3)
SODIUM: 141 mmol/L (ref 137–147)

## 2016-05-01 LAB — HEPATIC FUNCTION PANEL
ALK PHOS: 79 U/L (ref 25–125)
ALT: 9 U/L — AB (ref 10–40)
AST: 13 U/L — AB (ref 14–40)
Bilirubin, Total: 0.2 mg/dL

## 2016-05-01 LAB — CBC AND DIFFERENTIAL
HEMATOCRIT: 33 % — AB (ref 41–53)
HEMOGLOBIN: 10 g/dL — AB (ref 13.5–17.5)
Platelets: 572 10*3/uL — AB (ref 150–399)
WBC: 6.5 10^3/mL

## 2016-05-15 ENCOUNTER — Encounter: Payer: Self-pay | Admitting: Adult Health

## 2016-05-15 ENCOUNTER — Non-Acute Institutional Stay (SKILLED_NURSING_FACILITY): Payer: Medicaid Other | Admitting: Adult Health

## 2016-05-15 DIAGNOSIS — G894 Chronic pain syndrome: Secondary | ICD-10-CM
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
## 2016-05-26 ENCOUNTER — Non-Acute Institutional Stay (SKILLED_NURSING_FACILITY): Payer: Medicaid Other | Admitting: Adult Health

## 2016-05-26 ENCOUNTER — Encounter: Payer: Self-pay | Admitting: Adult Health

## 2016-05-26 DIAGNOSIS — Z978 Presence of other specified devices: Secondary | ICD-10-CM

## 2016-05-26 DIAGNOSIS — L89894 Pressure ulcer of other site, stage 4: Secondary | ICD-10-CM | POA: Diagnosis not present

## 2016-05-26 DIAGNOSIS — F333 Major depressive disorder, recurrent, severe with psychotic symptoms: Secondary | ICD-10-CM | POA: Diagnosis not present

## 2016-05-26 DIAGNOSIS — G894 Chronic pain syndrome: Secondary | ICD-10-CM

## 2016-05-26 DIAGNOSIS — G839 Paralytic syndrome, unspecified: Secondary | ICD-10-CM

## 2016-05-26 DIAGNOSIS — Z960 Presence of urogenital implants: Secondary | ICD-10-CM

## 2016-05-26 DIAGNOSIS — N319 Neuromuscular dysfunction of bladder, unspecified: Secondary | ICD-10-CM

## 2016-05-26 DIAGNOSIS — R627 Adult failure to thrive: Secondary | ICD-10-CM

## 2016-05-26 DIAGNOSIS — Z9289 Personal history of other medical treatment: Secondary | ICD-10-CM | POA: Diagnosis not present
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
## 2016-06-02 ENCOUNTER — Non-Acute Institutional Stay (SKILLED_NURSING_FACILITY): Payer: Medicaid Other | Admitting: Adult Health

## 2016-06-02 ENCOUNTER — Encounter: Payer: Self-pay | Admitting: Adult Health

## 2016-06-02 DIAGNOSIS — H1033 Unspecified acute conjunctivitis, bilateral: Secondary | ICD-10-CM | POA: Diagnosis not present
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
## 2016-06-26 ENCOUNTER — Encounter: Payer: Self-pay | Admitting: Adult Health

## 2016-06-26 ENCOUNTER — Non-Acute Institutional Stay (SKILLED_NURSING_FACILITY): Payer: Medicaid Other | Admitting: Adult Health

## 2016-06-26 DIAGNOSIS — F333 Major depressive disorder, recurrent, severe with psychotic symptoms: Secondary | ICD-10-CM | POA: Diagnosis not present

## 2016-06-26 DIAGNOSIS — E43 Unspecified severe protein-calorie malnutrition: Secondary | ICD-10-CM
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
## 2016-06-30 ENCOUNTER — Encounter (HOSPITAL_COMMUNITY): Payer: Self-pay

## 2016-06-30 ENCOUNTER — Inpatient Hospital Stay (HOSPITAL_COMMUNITY)
Admission: EM | Admit: 2016-06-30 | Discharge: 2016-07-26 | DRG: 871 | Disposition: A | Discharge status: Hospice | Payer: Medicaid Other | Attending: Family Medicine | Admitting: Family Medicine

## 2016-06-30 DIAGNOSIS — Z95828 Presence of other vascular implants and grafts: Secondary | ICD-10-CM

## 2016-06-30 DIAGNOSIS — R51 Headache: Secondary | ICD-10-CM | POA: Diagnosis present

## 2016-06-30 DIAGNOSIS — A4159 Other Gram-negative sepsis: Principal | ICD-10-CM | POA: Diagnosis present

## 2016-06-30 DIAGNOSIS — Z87440 Personal history of urinary (tract) infections: Secondary | ICD-10-CM

## 2016-06-30 DIAGNOSIS — S31000A Unspecified open wound of lower back and pelvis without penetration into retroperitoneum, initial encounter: Secondary | ICD-10-CM | POA: Diagnosis present

## 2016-06-30 DIAGNOSIS — L89210 Pressure ulcer of right hip, unstageable: Secondary | ICD-10-CM | POA: Diagnosis present

## 2016-06-30 DIAGNOSIS — Z9981 Dependence on supplemental oxygen: Secondary | ICD-10-CM

## 2016-06-30 DIAGNOSIS — K5900 Constipation, unspecified: Secondary | ICD-10-CM | POA: Diagnosis present

## 2016-06-30 DIAGNOSIS — F22 Delusional disorders: Secondary | ICD-10-CM | POA: Diagnosis present

## 2016-06-30 DIAGNOSIS — Z515 Encounter for palliative care: Secondary | ICD-10-CM | POA: Diagnosis present

## 2016-06-30 DIAGNOSIS — L89110 Pressure ulcer of right upper back, unstageable: Secondary | ICD-10-CM | POA: Diagnosis present

## 2016-06-30 DIAGNOSIS — Z9119 Patient's noncompliance with other medical treatment and regimen: Secondary | ICD-10-CM

## 2016-06-30 DIAGNOSIS — B9689 Other specified bacterial agents as the cause of diseases classified elsewhere: Secondary | ICD-10-CM | POA: Diagnosis present

## 2016-06-30 DIAGNOSIS — R627 Adult failure to thrive: Secondary | ICD-10-CM | POA: Diagnosis present

## 2016-06-30 DIAGNOSIS — L89220 Pressure ulcer of left hip, unstageable: Secondary | ICD-10-CM | POA: Diagnosis present

## 2016-06-30 DIAGNOSIS — L89304 Pressure ulcer of unspecified buttock, stage 4: Secondary | ICD-10-CM | POA: Diagnosis present

## 2016-06-30 DIAGNOSIS — L89154 Pressure ulcer of sacral region, stage 4: Secondary | ICD-10-CM | POA: Diagnosis present

## 2016-06-30 DIAGNOSIS — W3400XS Accidental discharge from unspecified firearms or gun, sequela: Secondary | ICD-10-CM

## 2016-06-30 DIAGNOSIS — N390 Urinary tract infection, site not specified: Secondary | ICD-10-CM | POA: Diagnosis present

## 2016-06-30 DIAGNOSIS — F79 Unspecified intellectual disabilities: Secondary | ICD-10-CM | POA: Diagnosis present

## 2016-06-30 DIAGNOSIS — B964 Proteus (mirabilis) (morganii) as the cause of diseases classified elsewhere: Secondary | ICD-10-CM | POA: Diagnosis present

## 2016-06-30 DIAGNOSIS — Z5329 Procedure and treatment not carried out because of patient's decision for other reasons: Secondary | ICD-10-CM | POA: Diagnosis not present

## 2016-06-30 DIAGNOSIS — R000 Tachycardia, unspecified: Secondary | ICD-10-CM | POA: Diagnosis present

## 2016-06-30 DIAGNOSIS — I959 Hypotension, unspecified: Secondary | ICD-10-CM | POA: Diagnosis present

## 2016-06-30 DIAGNOSIS — M869 Osteomyelitis, unspecified: Secondary | ICD-10-CM | POA: Diagnosis present

## 2016-06-30 DIAGNOSIS — Z66 Do not resuscitate: Secondary | ICD-10-CM | POA: Diagnosis present

## 2016-06-30 DIAGNOSIS — G8929 Other chronic pain: Secondary | ICD-10-CM | POA: Diagnosis present

## 2016-06-30 DIAGNOSIS — Z22322 Carrier or suspected carrier of Methicillin resistant Staphylococcus aureus: Secondary | ICD-10-CM

## 2016-06-30 DIAGNOSIS — T83518A Infection and inflammatory reaction due to other urinary catheter, initial encounter: Secondary | ICD-10-CM | POA: Diagnosis present

## 2016-06-30 DIAGNOSIS — E43 Unspecified severe protein-calorie malnutrition: Secondary | ICD-10-CM | POA: Diagnosis present

## 2016-06-30 DIAGNOSIS — N319 Neuromuscular dysfunction of bladder, unspecified: Secondary | ICD-10-CM | POA: Diagnosis present

## 2016-06-30 DIAGNOSIS — F339 Major depressive disorder, recurrent, unspecified: Secondary | ICD-10-CM

## 2016-06-30 DIAGNOSIS — Z79891 Long term (current) use of opiate analgesic: Secondary | ICD-10-CM

## 2016-06-30 DIAGNOSIS — G9340 Encephalopathy, unspecified: Secondary | ICD-10-CM | POA: Diagnosis present

## 2016-06-30 DIAGNOSIS — M4628 Osteomyelitis of vertebra, sacral and sacrococcygeal region: Secondary | ICD-10-CM | POA: Diagnosis present

## 2016-06-30 DIAGNOSIS — L89120 Pressure ulcer of left upper back, unstageable: Secondary | ICD-10-CM | POA: Diagnosis present

## 2016-06-30 DIAGNOSIS — L89890 Pressure ulcer of other site, unstageable: Secondary | ICD-10-CM | POA: Diagnosis present

## 2016-06-30 DIAGNOSIS — R0602 Shortness of breath: Secondary | ICD-10-CM

## 2016-06-30 DIAGNOSIS — F32A Depression, unspecified: Secondary | ICD-10-CM

## 2016-06-30 DIAGNOSIS — Z960 Presence of urogenital implants: Secondary | ICD-10-CM

## 2016-06-30 DIAGNOSIS — K5289 Other specified noninfective gastroenteritis and colitis: Secondary | ICD-10-CM | POA: Diagnosis present

## 2016-06-30 DIAGNOSIS — Z79899 Other long term (current) drug therapy: Secondary | ICD-10-CM

## 2016-06-30 DIAGNOSIS — R109 Unspecified abdominal pain: Secondary | ICD-10-CM | POA: Diagnosis present

## 2016-06-30 DIAGNOSIS — G8220 Paraplegia, unspecified: Secondary | ICD-10-CM | POA: Diagnosis present

## 2016-06-30 DIAGNOSIS — E871 Hypo-osmolality and hyponatremia: Secondary | ICD-10-CM | POA: Diagnosis present

## 2016-06-30 DIAGNOSIS — A419 Sepsis, unspecified organism: Secondary | ICD-10-CM

## 2016-06-30 DIAGNOSIS — R1084 Generalized abdominal pain: Secondary | ICD-10-CM

## 2016-06-30 DIAGNOSIS — M868X8 Other osteomyelitis, other site: Secondary | ICD-10-CM

## 2016-06-30 DIAGNOSIS — Z1624 Resistance to multiple antibiotics: Secondary | ICD-10-CM | POA: Diagnosis present

## 2016-06-30 DIAGNOSIS — Z978 Presence of other specified devices: Secondary | ICD-10-CM

## 2016-06-30 DIAGNOSIS — Z681 Body mass index (BMI) 19 or less, adult: Secondary | ICD-10-CM

## 2016-06-30 DIAGNOSIS — G9341 Metabolic encephalopathy: Secondary | ICD-10-CM | POA: Diagnosis present

## 2016-06-30 DIAGNOSIS — Z86718 Personal history of other venous thrombosis and embolism: Secondary | ICD-10-CM

## 2016-06-30 DIAGNOSIS — Z7401 Bed confinement status: Secondary | ICD-10-CM

## 2016-06-30 DIAGNOSIS — F329 Major depressive disorder, single episode, unspecified: Secondary | ICD-10-CM

## 2016-06-30 DIAGNOSIS — E875 Hyperkalemia: Secondary | ICD-10-CM | POA: Diagnosis not present

## 2016-06-30 DIAGNOSIS — L89894 Pressure ulcer of other site, stage 4: Secondary | ICD-10-CM | POA: Diagnosis present

## 2016-06-30 DIAGNOSIS — Z8611 Personal history of tuberculosis: Secondary | ICD-10-CM

## 2016-06-30 DIAGNOSIS — F419 Anxiety disorder, unspecified: Secondary | ICD-10-CM | POA: Diagnosis present

## 2016-06-30 DIAGNOSIS — F1290 Cannabis use, unspecified, uncomplicated: Secondary | ICD-10-CM | POA: Diagnosis present

## 2016-06-30 DIAGNOSIS — Z87891 Personal history of nicotine dependence: Secondary | ICD-10-CM

## 2016-06-30 DIAGNOSIS — E876 Hypokalemia: Secondary | ICD-10-CM | POA: Diagnosis present

## 2016-06-30 DIAGNOSIS — A499 Bacterial infection, unspecified: Secondary | ICD-10-CM | POA: Diagnosis not present

## 2016-06-30 DIAGNOSIS — L89159 Pressure ulcer of sacral region, unspecified stage: Secondary | ICD-10-CM | POA: Diagnosis present

## 2016-06-30 LAB — CBC WITH DIFFERENTIAL/PLATELET
BASOS ABS: 0 10*3/uL (ref 0.0–0.1)
Basophils Relative: 0 %
EOS ABS: 0 10*3/uL (ref 0.0–0.7)
EOS PCT: 0 %
HCT: 31.1 % — ABNORMAL LOW (ref 39.0–52.0)
Hemoglobin: 10 g/dL — ABNORMAL LOW (ref 13.0–17.0)
LYMPHS ABS: 1 10*3/uL (ref 0.7–4.0)
LYMPHS PCT: 11 %
MCH: 25.6 pg — AB (ref 26.0–34.0)
MCHC: 32.2 g/dL (ref 30.0–36.0)
MCV: 79.5 fL (ref 78.0–100.0)
MONO ABS: 0.4 10*3/uL (ref 0.1–1.0)
Monocytes Relative: 5 %
Neutro Abs: 7.3 10*3/uL (ref 1.7–7.7)
Neutrophils Relative %: 84 %
PLATELETS: 597 10*3/uL — AB (ref 150–400)
RBC: 3.91 MIL/uL — ABNORMAL LOW (ref 4.22–5.81)
RDW: 15.7 % — AB (ref 11.5–15.5)
WBC: 8.7 10*3/uL (ref 4.0–10.5)

## 2016-06-30 LAB — COMPREHENSIVE METABOLIC PANEL
ALBUMIN: 2.3 g/dL — AB (ref 3.5–5.0)
ALT: 9 U/L — AB (ref 17–63)
AST: 13 U/L — AB (ref 15–41)
Alkaline Phosphatase: 68 U/L (ref 38–126)
Anion gap: 14 (ref 5–15)
BILIRUBIN TOTAL: 0.4 mg/dL (ref 0.3–1.2)
BUN: 27 mg/dL — AB (ref 6–20)
CO2: 25 mmol/L (ref 22–32)
CREATININE: 0.55 mg/dL — AB (ref 0.61–1.24)
Calcium: 9.3 mg/dL (ref 8.9–10.3)
Chloride: 91 mmol/L — ABNORMAL LOW (ref 101–111)
GFR calc Af Amer: >60 mL/min (ref >60)
GLUCOSE: 101 mg/dL — AB (ref 65–99)
POTASSIUM: 3.4 mmol/L — AB (ref 3.5–5.1)
Sodium: 130 mmol/L — ABNORMAL LOW (ref 135–145)
TOTAL PROTEIN: 8 g/dL (ref 6.5–8.1)

## 2016-06-30 LAB — URINALYSIS, ROUTINE W REFLEX MICROSCOPIC
Bilirubin Urine: NEGATIVE
Glucose, UA: NEGATIVE mg/dL
Ketones, ur: 5 mg/dL — AB
Nitrite: NEGATIVE
PROTEIN: 100 mg/dL — AB
SQUAMOUS EPITHELIAL / LPF: NONE SEEN
Specific Gravity, Urine: 1.018 (ref 1.005–1.030)
pH: 6 (ref 5.0–8.0)

## 2016-06-30 LAB — I-STAT CG4 LACTIC ACID, ED: LACTIC ACID, VENOUS: 1.6 mmol/L (ref 0.5–1.9)

## 2016-06-30 MED ORDER — SODIUM CHLORIDE 0.9 % IV BOLUS (SEPSIS)
1000.0000 mL | Freq: Once | INTRAVENOUS | Status: AC
  Administered 2016-06-30: 1000 mL via INTRAVENOUS
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
## 2016-06-30 NOTE — ED Notes (Signed)
Pt's mother, Bosie Helper, [phone]

## 2016-06-30 NOTE — ED Notes (Signed)
Assessment: Patient is severely malnourished and continuously calls out that he is cold and wants blankets and states that his is in pain all over .

## 2016-06-30 NOTE — ED Triage Notes (Signed)
Patient from [facility]. They reported that patient is altered and could have possible UTI. Patient has a chronic foley and is a DNR.

## 2016-07-01 ENCOUNTER — Emergency Department (HOSPITAL_COMMUNITY): Payer: Medicaid Other

## 2016-07-01 DIAGNOSIS — Z833 Family history of diabetes mellitus: Secondary | ICD-10-CM | POA: Diagnosis not present

## 2016-07-01 DIAGNOSIS — G8220 Paraplegia, unspecified: Secondary | ICD-10-CM

## 2016-07-01 DIAGNOSIS — L89220 Pressure ulcer of left hip, unstageable: Secondary | ICD-10-CM | POA: Diagnosis present

## 2016-07-01 DIAGNOSIS — Z1624 Resistance to multiple antibiotics: Secondary | ICD-10-CM | POA: Diagnosis not present

## 2016-07-01 DIAGNOSIS — E876 Hypokalemia: Secondary | ICD-10-CM | POA: Diagnosis not present

## 2016-07-01 DIAGNOSIS — Z8249 Family history of ischemic heart disease and other diseases of the circulatory system: Secondary | ICD-10-CM | POA: Diagnosis not present

## 2016-07-01 DIAGNOSIS — A488 Other specified bacterial diseases: Secondary | ICD-10-CM | POA: Diagnosis not present

## 2016-07-01 DIAGNOSIS — L89894 Pressure ulcer of other site, stage 4: Secondary | ICD-10-CM | POA: Diagnosis not present

## 2016-07-01 DIAGNOSIS — F329 Major depressive disorder, single episode, unspecified: Secondary | ICD-10-CM | POA: Diagnosis not present

## 2016-07-01 DIAGNOSIS — Z9289 Personal history of other medical treatment: Secondary | ICD-10-CM

## 2016-07-01 DIAGNOSIS — M4628 Osteomyelitis of vertebra, sacral and sacrococcygeal region: Secondary | ICD-10-CM | POA: Diagnosis present

## 2016-07-01 DIAGNOSIS — Z66 Do not resuscitate: Secondary | ICD-10-CM | POA: Diagnosis not present

## 2016-07-01 DIAGNOSIS — R1011 Right upper quadrant pain: Secondary | ICD-10-CM

## 2016-07-01 DIAGNOSIS — A499 Bacterial infection, unspecified: Secondary | ICD-10-CM | POA: Diagnosis not present

## 2016-07-01 DIAGNOSIS — L89120 Pressure ulcer of left upper back, unstageable: Secondary | ICD-10-CM | POA: Diagnosis not present

## 2016-07-01 DIAGNOSIS — F22 Delusional disorders: Secondary | ICD-10-CM | POA: Diagnosis present

## 2016-07-01 DIAGNOSIS — L89210 Pressure ulcer of right hip, unstageable: Secondary | ICD-10-CM | POA: Diagnosis not present

## 2016-07-01 DIAGNOSIS — M869 Osteomyelitis, unspecified: Secondary | ICD-10-CM | POA: Diagnosis present

## 2016-07-01 DIAGNOSIS — A4159 Other Gram-negative sepsis: Secondary | ICD-10-CM | POA: Diagnosis not present

## 2016-07-01 DIAGNOSIS — R1084 Generalized abdominal pain: Secondary | ICD-10-CM

## 2016-07-01 DIAGNOSIS — Z9981 Dependence on supplemental oxygen: Secondary | ICD-10-CM | POA: Diagnosis not present

## 2016-07-01 DIAGNOSIS — Z515 Encounter for palliative care: Secondary | ICD-10-CM | POA: Diagnosis not present

## 2016-07-01 DIAGNOSIS — N390 Urinary tract infection, site not specified: Secondary | ICD-10-CM | POA: Diagnosis present

## 2016-07-01 DIAGNOSIS — Z79899 Other long term (current) drug therapy: Secondary | ICD-10-CM | POA: Diagnosis not present

## 2016-07-01 DIAGNOSIS — F339 Major depressive disorder, recurrent, unspecified: Secondary | ICD-10-CM | POA: Diagnosis present

## 2016-07-01 DIAGNOSIS — G9340 Encephalopathy, unspecified: Secondary | ICD-10-CM | POA: Diagnosis not present

## 2016-07-01 DIAGNOSIS — E871 Hypo-osmolality and hyponatremia: Secondary | ICD-10-CM | POA: Diagnosis present

## 2016-07-01 DIAGNOSIS — L89890 Pressure ulcer of other site, unstageable: Secondary | ICD-10-CM | POA: Diagnosis present

## 2016-07-01 DIAGNOSIS — Z803 Family history of malignant neoplasm of breast: Secondary | ICD-10-CM | POA: Diagnosis not present

## 2016-07-01 DIAGNOSIS — G9341 Metabolic encephalopathy: Secondary | ICD-10-CM | POA: Diagnosis not present

## 2016-07-01 DIAGNOSIS — W3400XS Accidental discharge from unspecified firearms or gun, sequela: Secondary | ICD-10-CM | POA: Diagnosis not present

## 2016-07-01 DIAGNOSIS — Z79891 Long term (current) use of opiate analgesic: Secondary | ICD-10-CM | POA: Diagnosis not present

## 2016-07-01 DIAGNOSIS — Z87891 Personal history of nicotine dependence: Secondary | ICD-10-CM | POA: Diagnosis not present

## 2016-07-01 DIAGNOSIS — A419 Sepsis, unspecified organism: Secondary | ICD-10-CM | POA: Diagnosis present

## 2016-07-01 DIAGNOSIS — M8668 Other chronic osteomyelitis, other site: Secondary | ICD-10-CM | POA: Diagnosis not present

## 2016-07-01 DIAGNOSIS — R1030 Lower abdominal pain, unspecified: Secondary | ICD-10-CM | POA: Diagnosis not present

## 2016-07-01 DIAGNOSIS — E43 Unspecified severe protein-calorie malnutrition: Secondary | ICD-10-CM | POA: Diagnosis not present

## 2016-07-01 DIAGNOSIS — L89304 Pressure ulcer of unspecified buttock, stage 4: Secondary | ICD-10-CM | POA: Diagnosis not present

## 2016-07-01 DIAGNOSIS — I959 Hypotension, unspecified: Secondary | ICD-10-CM | POA: Diagnosis present

## 2016-07-01 DIAGNOSIS — L89110 Pressure ulcer of right upper back, unstageable: Secondary | ICD-10-CM | POA: Diagnosis not present

## 2016-07-01 DIAGNOSIS — T83518A Infection and inflammatory reaction due to other urinary catheter, initial encounter: Secondary | ICD-10-CM | POA: Diagnosis present

## 2016-07-01 DIAGNOSIS — Z681 Body mass index (BMI) 19 or less, adult: Secondary | ICD-10-CM | POA: Diagnosis not present

## 2016-07-01 DIAGNOSIS — L89154 Pressure ulcer of sacral region, stage 4: Secondary | ICD-10-CM | POA: Diagnosis not present

## 2016-07-01 DIAGNOSIS — F3289 Other specified depressive episodes: Secondary | ICD-10-CM | POA: Diagnosis not present

## 2016-07-01 LAB — MRSA PCR SCREENING: MRSA by PCR: POSITIVE — AB

## 2016-07-01 LAB — C-REACTIVE PROTEIN: CRP: 19.1 mg/dL — AB (ref <1.0)

## 2016-07-01 LAB — OSMOLALITY: Osmolality: 285 mOsm/kg (ref 275–295)

## 2016-07-01 LAB — SEDIMENTATION RATE: SED RATE: 97 mm/h — AB (ref 0–16)

## 2016-07-01 MED ORDER — ACETAMINOPHEN 325 MG PO TABS: 650.0000 mg | ORAL_TABLET | Freq: Four times a day (QID) | ORAL | Status: DC | PRN

## 2016-07-01 MED ORDER — SODIUM CHLORIDE 0.9 % IV BOLUS (SEPSIS)
500.0000 mL | Freq: Once | INTRAVENOUS | Status: AC
  Administered 2016-07-01: 500 mL via INTRAVENOUS

## 2016-07-01 MED ORDER — ZOLPIDEM TARTRATE 5 MG PO TABS
5.0000 mg | ORAL_TABLET | Freq: Every evening | ORAL | Status: DC | PRN
  Filled 2016-07-01: qty 1

## 2016-07-01 MED ORDER — GABAPENTIN 300 MG PO CAPS
300.0000 mg | ORAL_CAPSULE | Freq: Three times a day (TID) | ORAL | Status: DC
  Administered 2016-07-01 – 2016-07-17 (×58): 300 mg via ORAL
  Filled 2016-07-01 (×65): qty 1

## 2016-07-01 MED ORDER — COLLAGENASE 250 UNIT/GM EX OINT
TOPICAL_OINTMENT | Freq: Every day | CUTANEOUS | Status: DC
  Filled 2016-07-01: qty 30

## 2016-07-01 MED ORDER — ADULT MULTIVITAMIN W/MINERALS CH
1.0000 | ORAL_TABLET | Freq: Every day | ORAL | Status: DC
  Administered 2016-07-01 – 2016-07-04 (×2): 1 via ORAL
  Filled 2016-07-01 (×9): qty 1

## 2016-07-01 MED ORDER — CHLORHEXIDINE GLUCONATE CLOTH 2 % EX PADS
6.0000 | MEDICATED_PAD | Freq: Every day | CUTANEOUS | Status: AC
  Administered 2016-07-04: 6 via TOPICAL

## 2016-07-01 MED ORDER — IOPAMIDOL (ISOVUE-300) INJECTION 61%
INTRAVENOUS | Status: AC
  Administered 2016-07-01: 100 mL
  Filled 2016-07-01: qty 100

## 2016-07-01 MED ORDER — SODIUM CHLORIDE 0.9 % IV SOLN
INTRAVENOUS | Status: DC
  Administered 2016-07-01: 21:00:00 via INTRAVENOUS

## 2016-07-01 MED ORDER — BACLOFEN 10 MG PO TABS
20.0000 mg | ORAL_TABLET | Freq: Three times a day (TID) | ORAL | Status: DC
  Administered 2016-07-01 – 2016-07-17 (×58): 20 mg via ORAL
  Filled 2016-07-01 (×16): qty 2
  Filled 2016-07-01: qty 4
  Filled 2016-07-01 (×28): qty 2
  Filled 2016-07-01: qty 4
  Filled 2016-07-01 (×18): qty 2

## 2016-07-01 MED ORDER — VANCOMYCIN HCL 500 MG IV SOLR
500.0000 mg | Freq: Two times a day (BID) | INTRAVENOUS | Status: DC
  Administered 2016-07-01 – 2016-07-04 (×6): 500 mg via INTRAVENOUS
  Filled 2016-07-01 (×8): qty 500

## 2016-07-01 MED ORDER — HYDROMORPHONE HCL 1 MG/ML IJ SOLN
1.0000 mg | INTRAMUSCULAR | Status: DC | PRN
  Administered 2016-07-01: 1 mg via INTRAVENOUS
  Filled 2016-07-01 (×5): qty 1

## 2016-07-01 MED ORDER — LORAZEPAM 1 MG PO TABS: 1.0000 mg | ORAL_TABLET | ORAL | Status: DC | PRN

## 2016-07-01 MED ORDER — PRO-STAT SUGAR FREE PO LIQD
30.0000 mL | Freq: Two times a day (BID) | ORAL | Status: DC
  Administered 2016-07-01 – 2016-07-15 (×4): 30 mL via ORAL
  Filled 2016-07-01 (×14): qty 30

## 2016-07-01 MED ORDER — MUPIROCIN 2 % EX OINT
1.0000 "application " | TOPICAL_OINTMENT | Freq: Two times a day (BID) | CUTANEOUS | Status: AC
  Administered 2016-07-01 – 2016-07-06 (×6): 1 via NASAL
  Filled 2016-07-01 (×2): qty 22

## 2016-07-01 MED ORDER — POTASSIUM CHLORIDE 20 MEQ/15ML (10%) PO SOLN
20.0000 meq | Freq: Once | ORAL | Status: AC
  Administered 2016-07-01: 20 meq via ORAL
  Filled 2016-07-01: qty 15

## 2016-07-01 MED ORDER — PIPERACILLIN-TAZOBACTAM 3.375 G IVPB
3.3750 g | Freq: Three times a day (TID) | INTRAVENOUS | Status: DC
  Administered 2016-07-01 – 2016-07-03 (×6): 3.375 g via INTRAVENOUS
  Filled 2016-07-01 (×7): qty 50

## 2016-07-01 MED ORDER — POLYETHYLENE GLYCOL 3350 17 G PO PACK
17.0000 g | PACK | Freq: Every day | ORAL | Status: DC
  Administered 2016-07-01 – 2016-07-17 (×4): 17 g via ORAL
  Filled 2016-07-01 (×13): qty 1

## 2016-07-01 MED ORDER — COLLAGENASE 250 UNIT/GM EX OINT
TOPICAL_OINTMENT | Freq: Every day | CUTANEOUS | Status: DC
  Administered 2016-07-01 – 2016-07-07 (×4): via TOPICAL
  Administered 2016-07-12: 1 via TOPICAL
  Administered 2016-07-15 – 2016-07-24 (×3): via TOPICAL
  Filled 2016-07-01 (×3): qty 30

## 2016-07-01 MED ORDER — ENOXAPARIN SODIUM 30 MG/0.3ML ~~LOC~~ SOLN
30.0000 mg | Freq: Every day | SUBCUTANEOUS | Status: DC
  Administered 2016-07-01 – 2016-07-10 (×6): 30 mg via SUBCUTANEOUS
  Filled 2016-07-01 (×10): qty 0.3

## 2016-07-01 MED ORDER — PHENAZOPYRIDINE HCL 100 MG PO TABS
100.0000 mg | ORAL_TABLET | Freq: Three times a day (TID) | ORAL | Status: DC
  Administered 2016-07-01: 100 mg via ORAL
  Filled 2016-07-01 (×2): qty 1

## 2016-07-01 MED ORDER — OXYCODONE HCL 5 MG PO TABS
15.0000 mg | ORAL_TABLET | Freq: Once | ORAL | Status: AC
  Administered 2016-07-01: 15 mg via ORAL
  Filled 2016-07-01: qty 3

## 2016-07-01 MED ORDER — OXYCODONE HCL 5 MG PO TABS
15.0000 mg | ORAL_TABLET | ORAL | Status: DC
  Administered 2016-07-01 – 2016-07-03 (×10): 15 mg via ORAL
  Filled 2016-07-01 (×14): qty 3

## 2016-07-01 MED ORDER — DOCUSATE SODIUM 100 MG PO CAPS
100.0000 mg | ORAL_CAPSULE | Freq: Two times a day (BID) | ORAL | Status: DC
  Administered 2016-07-01: 100 mg via ORAL
  Filled 2016-07-01 (×3): qty 1

## 2016-07-01 MED ORDER — PHENAZOPYRIDINE HCL 100 MG PO TABS
100.0000 mg | ORAL_TABLET | Freq: Three times a day (TID) | ORAL | Status: AC
  Administered 2016-07-01 – 2016-07-04 (×8): 100 mg via ORAL
  Filled 2016-07-01 (×9): qty 1

## 2016-07-01 MED ORDER — SODIUM CHLORIDE 0.9% FLUSH
3.0000 mL | Freq: Two times a day (BID) | INTRAVENOUS | Status: DC
  Administered 2016-07-04 – 2016-07-25 (×19): 3 mL via INTRAVENOUS

## 2016-07-01 MED ORDER — ONDANSETRON HCL 4 MG/2ML IJ SOLN: 4.0000 mg | Freq: Three times a day (TID) | INTRAMUSCULAR | Status: DC | PRN

## 2016-07-01 MED ORDER — BOOST / RESOURCE BREEZE PO LIQD
1.0000 | Freq: Three times a day (TID) | ORAL | Status: DC
  Administered 2016-07-01 – 2016-07-15 (×4): 1 via ORAL

## 2016-07-01 MED ORDER — PIPERACILLIN-TAZOBACTAM 3.375 G IVPB 30 MIN
3.3750 g | Freq: Once | INTRAVENOUS | Status: AC
  Administered 2016-07-01: 3.375 g via INTRAVENOUS
  Filled 2016-07-01: qty 50

## 2016-07-01 MED ORDER — VANCOMYCIN HCL IN DEXTROSE 1-5 GM/200ML-% IV SOLN
1000.0000 mg | Freq: Once | INTRAVENOUS | Status: AC
  Administered 2016-07-01: 1000 mg via INTRAVENOUS
  Filled 2016-07-01: qty 200

## 2016-07-01 MED ORDER — MAGNESIUM HYDROXIDE 400 MG/5ML PO SUSP
30.0000 mL | Freq: Two times a day (BID) | ORAL | Status: DC | PRN
Start: 2016-07-01 — End: 2016-07-05

## 2016-07-01 MED ORDER — IOPAMIDOL (ISOVUE-300) INJECTION 61%
INTRAVENOUS | Status: AC
  Filled 2016-07-01: qty 100

## 2016-07-01 NOTE — Progress Notes (Signed)
Pharmacy Antibiotic Note  Joshua Park is a 45 y.o. male admitted on 06/30/2016 with sacral osteomyelitis.  Pharmacy has been consulted for Vancomycin/Zosyn dosing. Pt originally here for altered mental status/possible UTI. While undergoing CT abdomen for abdominal pain, found to have sacral osteomyelitis. WBC WNL. Scr of 0.55 appears to be close to baseline for pt, however, Scr somewhat unreliable estimate of renal function in paraplegia.   Plan: -Vancomycin 500 mg IV q12h -Zosyn 3.375G IV q8h to be infused over 4 hours -Trend WBC, temp, renal function  -Drug levels as indicated   Temp (24hrs), Avg:97.6 F (36.4 C), Min:97.2 F (36.2 C), Max:97.8 F (36.6 C)   Recent Labs Lab 06/30/16 1920 06/30/16 1947  WBC 8.7  --   CREATININE 0.55*  --   LATICACIDVEN  --  1.60    Estimated Creatinine Clearance: 75.4 mL/min (by C-G formula based on SCr of 0.55 mg/dL (L)).    No Known Allergies   Joshua Park 07/01/2016 6:17 AM
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
## 2016-07-01 NOTE — Progress Notes (Signed)
Received report from ED RN. Room ready for patient. Adriella Essex Joselita, RN 
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
## 2016-07-01 NOTE — ED Notes (Signed)
Patient returned from CT

## 2016-07-01 NOTE — Progress Notes (Signed)
New dressing placed to patients sacrum, left and right posterior hips and cervical spine. Gauze with pink foam placed.
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
## 2016-07-01 NOTE — Progress Notes (Signed)
07/01/16 06:50 Received patient from ED.Patient alert and oriented x4.Patient is paraplegic.Foley catheter in place. IVF infusing.Patient made comfortable in bed. Telma Pyeatt, [name], Therapist, sports

## 2016-07-01 NOTE — Consult Note (Signed)
[facility] Nurse wound consult note Reason for Consult: multiple pressure injuries Wound type: 1. Sacrum Stage 4 pressure injury; 16cm x 7.5cm x 1.5cm at deepest point; 90% clean-10% fibrin 2. Left ischium Stage 4 pressure injury: 6cm x 4cm x 2.5cm; 100 % pink, moist 3. Left distal ischial area Unstageable pressure injury: 3cm x 2cm x 0.1cm; 100% soft eschar 4. Left trochanter Unstageable pressure injury: 3cm x 1.5cm x 0.1cm; 100% grey slough 5. Left  upper back Unstageable pressure injury: 5.5cm x 3cm x 0.1cm; 50% black, soft-50% pink 6. Right scapula Unstageable pressure injury: 4cm x 3cm x 0.1cm; 100% yellow/black 7. Right posterior calf: Unstageable pressure injury: 5.2cm x 1.5cm x 0.1cm; 100% soft black eschar 8. Right posterior Stage 2 pressure injury: 1cm x 1cm x 0.1cm; 100% pink, moist 9. Right ischium Stage 4 pressure injury: 7cm x 6cm x 3.0cm; 50% yellow/50% pink 10. Right knee Unstageable pressure injury: 6cm x 3cm x 0.1cm; 50% black/50% pink  Pressure Ulcer POA: Yes Measurement: see above Wound bed: see above Drainage (amount, consistency, odor) most sites are moderately draining, non purulent, slightly odor but feel this is due to the necrotic tissue  Periwound: intact  Dressing procedure/placement/frequency: Discussed options for cleaning up the necrotic ulcers, he wishes to proceed with hydrotherapy and enzymatic debridement.  He is severely malnourished and most likely he will never heal any of these ulcers. He refuses wound care some times at the facility per the patient's report. He is not eating.  I stressed the importance of protein intake.   I have added a pressure redistribution mattress, he needs this as well in the facility.  Notified PT of the new order for hydrotherapy and unit secretary has ordered LALM for patient.   Patient may benefit from a palliative care consult.  [place] Nurse team will follow along with you for weekly wound assessments.  Please notify me of any acute  changes in the wounds or any new areas of concerns [place] MSN, [place], CNS [phone]

## 2016-07-01 NOTE — Progress Notes (Signed)
Physical Therapy Wound Treatment Patient Details  Name: Joshua Park MRN: [id number] Date of Birth: 06-22-1971  Today's Date: 07/01/2016 Time: 1420-1530 Time Calculation (min): 70 min  Subjective  Subjective: Pt wanting to be last on our list for the day.  Patient and Family Stated Goals: Heal wounds Date of Onset:  (Unknown) Prior Treatments:  (Unknown - previous hydrotherapy)  Pain Score: Pain Score: Pt premedicated  Wound Assessment  Pressure Injury 07/01/16 Unstageable - Full thickness tissue loss in which the base of the ulcer is covered by slough (yellow, tan, gray, green or brown) and/or eschar (tan, brown or black) in the wound bed. Trochanter (Active)  Dressing Type ABD;Barrier Film (skin prep);Gauze (Comment);Moist to dry 07/01/2016  3:24 PM  Dressing Clean;Dry;Intact 07/01/2016  3:24 PM  Dressing Change Frequency Daily 07/01/2016  3:24 PM  State of Healing Eschar 07/01/2016  3:24 PM  Site / Wound Assessment Black 07/01/2016  3:24 PM  % Wound base Red or Granulating 0% 07/01/2016  3:24 PM  % Wound base Yellow/Fibrinous Exudate 0% 07/01/2016  3:24 PM  % Wound base Black/Eschar 100% 07/01/2016  3:24 PM  % Wound base Other/Granulation Tissue (Comment) 0% 07/01/2016  3:24 PM  Wound Length (cm) 3 cm 07/01/2016  3:24 PM  Wound Width (cm) 1.5 cm 07/01/2016  3:24 PM  Wound Depth (cm) 0.1 cm 07/01/2016  3:24 PM  Margins Unattached edges (unapproximated) 07/01/2016  3:24 PM  Drainage Amount Minimal 07/01/2016  3:24 PM  Drainage Description Serosanguineous 07/01/2016  3:24 PM  Treatment Debridement (Selective);Hydrotherapy (Pulse lavage);Packing (Saline gauze) 07/01/2016  3:24 PM   Santyl applied to wound bed prior to applying dressing.    Pressure Injury 07/01/16 (Active)  Dressing Type ABD;Barrier Film (skin prep);Gauze (Comment);Moist to dry 07/01/2016  3:24 PM  Dressing Clean;Dry;Intact 07/01/2016  3:24 PM  Dressing Change Frequency Daily 07/01/2016  3:24 PM  State of  Healing Eschar 07/01/2016  3:24 PM  Site / Wound Assessment Yellow;Pink 07/01/2016  3:24 PM  % Wound base Red or Granulating 50% 07/01/2016  3:24 PM  % Wound base Yellow/Fibrinous Exudate 50% 07/01/2016  3:24 PM  Peri-wound Assessment Intact 07/01/2016  3:24 PM  Wound Length (cm) 7 cm 07/01/2016  3:24 PM  Wound Width (cm) 6 cm 07/01/2016  3:24 PM  Wound Depth (cm) 3 cm 07/01/2016  3:24 PM  Margins Unattached edges (unapproximated) 07/01/2016  3:24 PM  Drainage Amount Minimal 07/01/2016  3:24 PM  Drainage Description Serosanguineous 07/01/2016  3:24 PM  Treatment Debridement (Selective);Hydrotherapy (Pulse lavage);Packing (Saline gauze) 07/01/2016  3:24 PM   Santyl applied to wound bed prior to applying dressing.    Pressure Injury 07/01/16 (Active)  Dressing Type ABD;Barrier Film (skin prep);Gauze (Comment);Moist to dry 07/01/2016  3:24 PM  Dressing Clean;Dry;Intact 07/01/2016  3:24 PM  Dressing Change Frequency Daily 07/01/2016  3:24 PM  State of Healing Eschar 07/01/2016  3:24 PM  Site / Wound Assessment Yellow;Black;Pink 07/01/2016  3:24 PM  % Wound base Red or Granulating 90% 07/01/2016  3:24 PM  % Wound base Yellow/Fibrinous Exudate 5% 07/01/2016  3:24 PM  % Wound base Black/Eschar 5% 07/01/2016  3:24 PM  Peri-wound Assessment Intact 07/01/2016  3:24 PM  Wound Length (cm) 16 cm 07/01/2016  3:24 PM  Wound Width (cm) 7.5 cm 07/01/2016  3:24 PM  Wound Depth (cm) 1.5 cm 07/01/2016  3:24 PM  Margins Unattached edges (unapproximated) 07/01/2016  3:24 PM  Drainage Amount Minimal 07/01/2016  3:24 PM  Drainage Description Serosanguineous 07/01/2016  3:24 PM  Treatment Debridement (Selective);Hydrotherapy (Pulse lavage);Packing (Saline gauze) 07/01/2016  3:24 PM   Santyl applied to wound bed prior to applying dressing.    Pressure Injury 07/01/16 (Active)  Dressing Type ABD;Barrier Film (skin prep);Gauze (Comment);Moist to dry 07/01/2016  3:24 PM  Dressing Clean;Dry;Intact 07/01/2016   3:24 PM  Dressing Change Frequency Daily 07/01/2016  3:24 PM  State of Healing Eschar 07/01/2016  3:24 PM  Site / Wound Assessment Black 07/01/2016  3:24 PM  % Wound base Red or Granulating 0% 07/01/2016  3:24 PM  % Wound base Yellow/Fibrinous Exudate 0% 07/01/2016  3:24 PM  % Wound base Black/Eschar 100% 07/01/2016  3:24 PM  % Wound base Other/Granulation Tissue (Comment) 0% 07/01/2016  3:24 PM  Peri-wound Assessment Intact 07/01/2016  3:24 PM  Wound Length (cm) 5.2 cm 07/01/2016  3:24 PM  Wound Width (cm) 1.5 cm 07/01/2016  3:24 PM  Wound Depth (cm) 0.1 cm 07/01/2016  3:24 PM  Margins Unattached edges (unapproximated) 07/01/2016  3:24 PM  Drainage Amount Minimal 07/01/2016  3:24 PM  Drainage Description Serosanguineous 07/01/2016  3:24 PM  Treatment Debridement (Selective);Hydrotherapy (Pulse lavage);Packing (Saline gauze) 07/01/2016  3:24 PM  Santyl applied to wound bed prior to applying dressing.   Hydrotherapy Pulsed lavage therapy - wound location: sacrum, trochanter, calf, ischium Pulsed Lavage with Suction (psi): 12 psi Pulsed Lavage with Suction - Normal Saline Used: 1000 mL Pulsed Lavage Tip: Tip with splash shield Selective Debridement Selective Debridement - Location: sacrum, trochanter, calf, ischium Selective Debridement - Tools Used: Forceps;Scissors Selective Debridement - Tissue Removed: black and yellow necrotic tissue   Wound Assessment and Plan  Wound Therapy - Assess/Plan/Recommendations Wound Therapy - Clinical Statement: Pt presents to hydrotherapy with several pressure injuries. Pt would benefit from continued hydrotherapy for selective removal of necrotic tissue and to promote wound bed healing.  Wound Therapy - Functional Problem List: Decreased tolerance for OOB Factors Delaying/Impairing Wound Healing: Altered sensation;Incontinence;Immobility Hydrotherapy Plan: Debridement;Dressing change;Patient/family education;Pulsatile lavage with suction Wound  Therapy - Frequency: 6X / week Wound Therapy - Follow Up Recommendations: Skilled nursing facility Wound Plan: See above  Wound Therapy Goals- Improve the function of patient's integumentary system by progressing the wound(s) through the phases of wound healing (inflammation - proliferation - remodeling) by: Decrease Necrotic Tissue to: 25% grossly Decrease Necrotic Tissue - Progress: Goal set today Increase Granulation Tissue to: 75% grossly Increase Granulation Tissue - Progress: Goal set today Goals/treatment plan/discharge plan were made with and agreed upon by patient/family: Yes Time For Goal Achievement: 2 weeks Wound Therapy - Potential for Goals: Fair  Goals will be updated until maximal potential achieved or discharge criteria met.  Discharge criteria: when goals achieved, discharge from hospital, MD decision/surgical intervention, no progress towards goals, refusal/missing three consecutive treatments without notification or medical reason.  GP     Thelma Comp 07/01/2016, 3:47 PM   Joshua Park, PT, DPT Acute Rehabilitation Services Pager: [phone]

## 2016-07-02 ENCOUNTER — Inpatient Hospital Stay (HOSPITAL_COMMUNITY): Payer: Medicaid Other

## 2016-07-02 LAB — BASIC METABOLIC PANEL
Anion gap: 7 (ref 5–15)
Anion gap: 9 (ref 5–15)
Anion gap: 6 (ref 5–15)
Anion gap: 7 (ref 5–15)
BUN: 6 mg/dL (ref 6–20)
CALCIUM: 7.4 mg/dL — AB (ref 8.9–10.3)
CALCIUM: 7.6 mg/dL — AB (ref 8.9–10.3)
CHLORIDE: 105 mmol/L (ref 101–111)
CHLORIDE: 105 mmol/L (ref 101–111)
CHLORIDE: 107 mmol/L (ref 101–111)
CO2: 26 mmol/L (ref 22–32)
CO2: 23 mmol/L (ref 22–32)
CO2: 25 mmol/L (ref 22–32)
CO2: 23 mmol/L (ref 22–32)
Calcium: 7.9 mg/dL — ABNORMAL LOW (ref 8.9–10.3)
Calcium: 7.5 mg/dL — ABNORMAL LOW (ref 8.9–10.3)
Chloride: 106 mmol/L (ref 101–111)
Creatinine, Ser: <0.3 mg/dL — ABNORMAL LOW (ref 0.61–1.24)
GLUCOSE: 85 mg/dL (ref 65–99)
Glucose, Bld: 91 mg/dL (ref 65–99)
Glucose, Bld: 118 mg/dL — ABNORMAL HIGH (ref 65–99)
Glucose, Bld: 96 mg/dL (ref 65–99)
POTASSIUM: 2.3 mmol/L — AB (ref 3.5–5.1)
POTASSIUM: 2.2 mmol/L — AB (ref 3.5–5.1)
Potassium: <2 mmol/L — CL (ref 3.5–5.1)
Potassium: 2.3 mmol/L — CL (ref 3.5–5.1)
SODIUM: 137 mmol/L (ref 135–145)
Sodium: 138 mmol/L (ref 135–145)
Sodium: 137 mmol/L (ref 135–145)
Sodium: 137 mmol/L (ref 135–145)

## 2016-07-02 LAB — LACTIC ACID, PLASMA
LACTIC ACID, VENOUS: 0.7 mmol/L (ref 0.5–1.9)
Lactic Acid, Venous: 0.8 mmol/L (ref 0.5–1.9)

## 2016-07-02 LAB — OSMOLALITY, URINE: Osmolality, Ur: 554 mOsm/kg (ref 300–900)

## 2016-07-02 LAB — GLUCOSE, CAPILLARY
GLUCOSE-CAPILLARY: 111 mg/dL — AB (ref 65–99)
Glucose-Capillary: 118 mg/dL — ABNORMAL HIGH (ref 65–99)

## 2016-07-02 LAB — CBC
HEMATOCRIT: 25.6 % — AB (ref 39.0–52.0)
HEMOGLOBIN: 8.1 g/dL — AB (ref 13.0–17.0)
MCH: 25.6 pg — AB (ref 26.0–34.0)
MCHC: 31.6 g/dL (ref 30.0–36.0)
MCV: 81 fL (ref 78.0–100.0)
Platelets: 431 10*3/uL — ABNORMAL HIGH (ref 150–400)
RBC: 3.16 MIL/uL — AB (ref 4.22–5.81)
RDW: 16.3 % — ABNORMAL HIGH (ref 11.5–15.5)
WBC: 7.8 10*3/uL (ref 4.0–10.5)

## 2016-07-02 LAB — PROCALCITONIN: PROCALCITONIN: 0.17 ng/mL

## 2016-07-02 LAB — MAGNESIUM: Magnesium: 1.7 mg/dL (ref 1.7–2.4)

## 2016-07-02 LAB — PROTIME-INR
INR: 1.17
Prothrombin Time: 15 seconds (ref 11.4–15.2)

## 2016-07-02 LAB — APTT: APTT: 41 s — AB (ref 24–36)

## 2016-07-02 LAB — TYPE AND SCREEN
ABO/RH(D): A POS
ANTIBODY SCREEN: NEGATIVE

## 2016-07-02 LAB — SODIUM, URINE, RANDOM: SODIUM UR: 15 mmol/L

## 2016-07-02 MED ORDER — SENNOSIDES-DOCUSATE SODIUM 8.6-50 MG PO TABS
2.0000 | ORAL_TABLET | Freq: Two times a day (BID) | ORAL | Status: DC
  Administered 2016-07-03 – 2016-07-14 (×15): 2 via ORAL
  Filled 2016-07-02 (×19): qty 2

## 2016-07-02 MED ORDER — POTASSIUM CHLORIDE CRYS ER 20 MEQ PO TBCR
40.0000 meq | EXTENDED_RELEASE_TABLET | Freq: Once | ORAL | Status: DC
  Filled 2016-07-02 (×2): qty 2

## 2016-07-02 MED ORDER — SODIUM CHLORIDE 0.9 % IV SOLN
30.0000 meq | INTRAVENOUS | Status: AC
Start: 2016-07-02 — End: 2016-07-03
  Administered 2016-07-02 – 2016-07-03 (×3): 30 meq via INTRAVENOUS
  Filled 2016-07-02 (×3): qty 15

## 2016-07-02 MED ORDER — SODIUM CHLORIDE 0.9 % IV BOLUS (SEPSIS)
1000.0000 mL | Freq: Once | INTRAVENOUS | Status: AC
  Administered 2016-07-02: 1000 mL via INTRAVENOUS

## 2016-07-02 MED ORDER — SODIUM CHLORIDE 0.9 % IV BOLUS (SEPSIS)
500.0000 mL | Freq: Once | INTRAVENOUS | Status: AC
  Administered 2016-07-02: 500 mL via INTRAVENOUS

## 2016-07-02 MED ORDER — SODIUM CHLORIDE 0.9 % IV SOLN: 30.0000 meq | Freq: Once | INTRAVENOUS | Status: DC

## 2016-07-02 MED ORDER — BISACODYL 10 MG RE SUPP
10.0000 mg | Freq: Every day | RECTAL | Status: DC
  Administered 2016-07-04 – 2016-07-09 (×3): 10 mg via RECTAL
  Filled 2016-07-02 (×9): qty 1

## 2016-07-02 MED ORDER — KETOROLAC TROMETHAMINE 30 MG/ML IJ SOLN
30.0000 mg | Freq: Once | INTRAMUSCULAR | Status: DC
  Filled 2016-07-02: qty 1

## 2016-07-02 MED ORDER — POTASSIUM CHLORIDE CRYS ER 20 MEQ PO TBCR
40.0000 meq | EXTENDED_RELEASE_TABLET | ORAL | Status: AC
  Administered 2016-07-02: 40 meq via ORAL
  Filled 2016-07-02 (×2): qty 2

## 2016-07-02 MED ORDER — POTASSIUM CHLORIDE IN NACL 40-0.9 MEQ/L-% IV SOLN
INTRAVENOUS | Status: DC
  Administered 2016-07-02 – 2016-07-15 (×16): 75 mL/h via INTRAVENOUS
  Filled 2016-07-02 (×26): qty 1000

## 2016-07-02 MED ORDER — SODIUM CHLORIDE 0.9 % IV SOLN
30.0000 meq | Freq: Once | INTRAVENOUS | Status: AC
  Administered 2016-07-02: 30 meq via INTRAVENOUS
  Filled 2016-07-02: qty 15

## 2016-07-02 MED ORDER — SORBITOL 70 % SOLN
960.0000 mL | TOPICAL_OIL | Freq: Once | ORAL | Status: DC
  Filled 2016-07-02: qty 240

## 2016-07-02 MED ORDER — SODIUM CHLORIDE 0.9 % IV SOLN
30.0000 meq | INTRAVENOUS | Status: DC
  Filled 2016-07-02 (×3): qty 15

## 2016-07-02 MED ORDER — POTASSIUM CHLORIDE CRYS ER 20 MEQ PO TBCR: 40.0000 meq | EXTENDED_RELEASE_TABLET | ORAL | Status: DC

## 2016-07-02 MED ORDER — MAGNESIUM SULFATE 2 GM/50ML IV SOLN
2.0000 g | Freq: Once | INTRAVENOUS | Status: AC
  Administered 2016-07-02: 2 g via INTRAVENOUS
  Filled 2016-07-02: qty 50

## 2016-07-02 NOTE — Progress Notes (Signed)
MD ordered to go ahead and give pt. Scheduled pain medicine while 1L NS bolus was running. Orders followed. MD stated that she would like Mg2+ drawn. Orders placed. Will continue to monitor.

## 2016-07-02 NOTE — Progress Notes (Signed)
Pts. B/P after 1L bolus is 105/66. MD notified. Ordered to give 1L bolus. Orders followed. Will continue to monitor.
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
## 2016-07-02 NOTE — Progress Notes (Signed)
The pt. Is refusing his PO K+ as well as his enema. I have educated him on the importance of these medications. The MD spoke with the patients mother and she called him. He refused to talk with his mother. His mother told me to tell him: "you need to take the enema to make you feel better!" He refused. The mother stated that she would have the patients brother come and talk to him. MD notified. Order to give IV K+. Will continue to monitor and educate.

## 2016-07-02 NOTE — Progress Notes (Signed)
Pts. B/P was 88/59. MD notified. Ordered to give 1L bolus of NS and hold scheduled oxycodone. Orders followed. Will continue to monitor.
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
## 2016-07-02 NOTE — Progress Notes (Addendum)
Pt BP was 79/51 at 2030 07/01/16. MD alerted and 567mL NS bolus placed. Given to pt and BP then 81/52. MD notified again and another 565mL NS bolus placed. Given to pt and BP then 79/50. MD notified again with another 571mL NS bolus placed. BP then 81/50 and MD notified again. Pt also complaining of stomach pain and MD alerted to this as well, not able to give narcotics due to BP. MD placed another 513mL bolus and Toradol. Pt refused toradol stating it would do nothing for him, just the dilaudid would help. 4th 556mL NS bolus completed and BP up to 117/75, pt given his oxycodone with AM meds. Pt resting comfortably.

## 2016-07-02 NOTE — Clinical Social Work Note (Signed)
CSW received call from nurse liaison with Joshua Park, Joshua Park regarding patient. She is requesting a psychiatry evaluation on patient due to concerns expressed by staff at facility. [employer] staff are very concerned about patient and his mental status as he refuses care, bathing, dressing changes. Per staff, patient's sheets at the facility are brown from his bodily secretions, and his body odor is affecting other residents and visitors. It has been noted that patient also keeps numerous food containers in bed with him. Joshua Park will not eat food from facility, but will eat food brought in by family per staff. Per Joshua Park patient is also exhibiting paranoia, thinking that the water at the facility is poisoning him and he also has been expressing that he thinks his family is trying to poison him. Per Joshua Park, patient has been refusing care for approx. 6 months.   MD contacted regarding facility concerns and request for psych evaluation. CSW will continue to follow and assist with discharge back to facility and any other services as needed.  Joshua Park, MSW, LCSW Licensed Clinical Social Worker [place] [phone]

## 2016-07-02 NOTE — Progress Notes (Signed)
CRITICAL VALUE ALERT  Critical value received: Potassium 2.3  Date of notification: 07/02/2016  Time of notification: 0715  Critical value read back: Yes  Nurse who received alert: Rayburn Go  MD notified (1st page): Florencia Reasons  Time of first page: [phone]

## 2016-07-02 NOTE — Progress Notes (Signed)
After 1L bolus, patients B/P is 129/85. Patient states, "I am having trouble catching my breath." Respirations are 18 and O2 sat is 100% on Room Air. MD notified. Ordered to complete STAT EKG. Orders to be followed. Will continue to monitor.

## 2016-07-02 NOTE — Progress Notes (Signed)
CRITICAL VALUE ALERT  Critical value received: K+:2.2  Date of notification: 07/02/16  Time of notification: 1127  Critical value read back: Yes  Nurse who received alert: Dixie Dials  MD notified (1st page): XU  Time of first page: 1129  MD notified (2nd page):   Time of second page:  Responding MD:  Erlinda Hong  Time MD responded: 1135

## 2016-07-02 NOTE — Progress Notes (Signed)
Patient is refusing hydrotherapy and a enema that the MD has ordered. Patient educated. MD notified. Will continue to provide education and monitor.

## 2016-07-02 NOTE — Progress Notes (Signed)
Pt. Continues to refuse enema. Will continue to educate.

## 2016-07-02 NOTE — Progress Notes (Signed)
PT Hydrotherapy Cancellation Note  Patient Details Name: GUTHRIE CRISP MRN: [id number] DOB: 1971/01/31   Cancelled Treatment:    Reason Eval/Treat Not Completed: Pain limiting ability to participate. First attempt in AM - BP low and pain meds held. Pt did not feel he could tolerate treatment without pain medication. Second attempt in PM after pain medication was administered. Pt continues to refuse hydrotherapy. Pt was advised that we timed our visit with his pain medication for his comfort. Pt also informed that we could not come back later this afternoon as he was requesting. Pt stated understanding that if he refuses services at this time, he will not receive hydrotherapy today. RN present in room. Will continue to follow.    Thelma Comp 07/02/2016, 1:43 PM  Rolinda Roan, PT, DPT Acute Rehabilitation Services Pager: [phone]

## 2016-07-02 NOTE — Progress Notes (Signed)
CRITICAL VALUE ALERT  Critical value received: Potassium 2.3  Date of notification: 07/02/16  Time of notification: 2045  Critical value read back: Yes  Nurse who received alert: Rayburn Go  MD notified (1st page): Raliegh Ip Schorr  Time of first page: 2046

## 2016-07-02 NOTE — Progress Notes (Signed)
CRITICAL VALUE ALERT  Critical value received: K+: Less than 2  Date of notification: 07/02/16  Time of notification:  [id number]  Critical value read back:yes  Nurse who received alert: Dixie Dials  MD notified (1st page): XU  Time of first page:  1655  MD notified (2nd page):  Time of second page:  Responding Md: XU  Time MD responded: [id number]

## 2016-07-02 NOTE — Progress Notes (Signed)
Patients mother is requesting to talk with the MD via telephone. MD notified.

## 2016-07-03 ENCOUNTER — Inpatient Hospital Stay (HOSPITAL_COMMUNITY): Payer: Medicaid Other

## 2016-07-03 DIAGNOSIS — Z515 Encounter for palliative care: Secondary | ICD-10-CM

## 2016-07-03 DIAGNOSIS — F3289 Other specified depressive episodes: Secondary | ICD-10-CM

## 2016-07-03 DIAGNOSIS — R1030 Lower abdominal pain, unspecified: Secondary | ICD-10-CM

## 2016-07-03 LAB — BASIC METABOLIC PANEL
ANION GAP: 4 — AB (ref 5–15)
Anion gap: 6 (ref 5–15)
BUN: <5 mg/dL — ABNORMAL LOW (ref 6–20)
CALCIUM: 7.5 mg/dL — AB (ref 8.9–10.3)
CALCIUM: 7.6 mg/dL — AB (ref 8.9–10.3)
CO2: 23 mmol/L (ref 22–32)
CO2: 23 mmol/L (ref 22–32)
CREATININE: 0.33 mg/dL — AB (ref 0.61–1.24)
Chloride: 111 mmol/L (ref 101–111)
Chloride: 110 mmol/L (ref 101–111)
GLUCOSE: 98 mg/dL (ref 65–99)
Glucose, Bld: 98 mg/dL (ref 65–99)
Potassium: 3.9 mmol/L (ref 3.5–5.1)
Potassium: 4.3 mmol/L (ref 3.5–5.1)
SODIUM: 139 mmol/L (ref 135–145)
Sodium: 138 mmol/L (ref 135–145)

## 2016-07-03 LAB — URINE CULTURE

## 2016-07-03 LAB — CBC
HCT: 25.2 % — ABNORMAL LOW (ref 39.0–52.0)
HEMOGLOBIN: 7.8 g/dL — AB (ref 13.0–17.0)
MCH: 25.7 pg — AB (ref 26.0–34.0)
MCHC: 31 g/dL (ref 30.0–36.0)
MCV: 82.9 fL (ref 78.0–100.0)
Platelets: 400 10*3/uL (ref 150–400)
RBC: 3.04 MIL/uL — ABNORMAL LOW (ref 4.22–5.81)
RDW: 16.8 % — ABNORMAL HIGH (ref 11.5–15.5)
WBC: 7.8 10*3/uL (ref 4.0–10.5)

## 2016-07-03 LAB — MAGNESIUM: MAGNESIUM: 1.7 mg/dL (ref 1.7–2.4)

## 2016-07-03 MED ORDER — OLANZAPINE 5 MG PO TBDP
5.0000 mg | ORAL_TABLET | Freq: Every evening | ORAL | Status: DC | PRN
  Administered 2016-07-03: 5 mg via ORAL
  Filled 2016-07-03 (×2): qty 1

## 2016-07-03 MED ORDER — SODIUM CHLORIDE 0.9 % IV BOLUS (SEPSIS)
1000.0000 mL | Freq: Once | INTRAVENOUS | Status: AC
  Administered 2016-07-03: 1000 mL via INTRAVENOUS

## 2016-07-03 MED ORDER — OXYCODONE HCL 5 MG PO TABS
20.0000 mg | ORAL_TABLET | ORAL | Status: DC
  Administered 2016-07-03 – 2016-07-17 (×63): 20 mg via ORAL
  Filled 2016-07-03 (×70): qty 4

## 2016-07-03 MED ORDER — MIRTAZAPINE 30 MG PO TBDP
30.0000 mg | ORAL_TABLET | Freq: Every day | ORAL | Status: DC
  Administered 2016-07-04 – 2016-07-17 (×10): 30 mg via ORAL
  Filled 2016-07-03 (×17): qty 1

## 2016-07-03 MED ORDER — SODIUM CHLORIDE 0.9 % IV SOLN
1.0000 g | Freq: Three times a day (TID) | INTRAVENOUS | Status: DC
  Administered 2016-07-03 – 2016-07-12 (×26): 1 g via INTRAVENOUS
  Filled 2016-07-03 (×28): qty 1

## 2016-07-03 MED ORDER — OLANZAPINE 10 MG PO TBDP
10.0000 mg | ORAL_TABLET | Freq: Every day | ORAL | Status: DC
  Filled 2016-07-03: qty 1

## 2016-07-03 MED ORDER — MAGNESIUM OXIDE 400 (241.3 MG) MG PO TABS
400.0000 mg | ORAL_TABLET | Freq: Every day | ORAL | Status: AC
Start: 2016-07-03 — End: 2016-07-04
  Administered 2016-07-03 – 2016-07-04 (×2): 400 mg via ORAL
  Filled 2016-07-03 (×2): qty 1

## 2016-07-03 MED ORDER — METHYLNALTREXONE BROMIDE 12 MG/0.6ML ~~LOC~~ SOLN
12.0000 mg | Freq: Once | SUBCUTANEOUS | Status: AC
  Administered 2016-07-03: 12 mg via SUBCUTANEOUS
  Filled 2016-07-03: qty 0.6
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
## 2016-07-03 NOTE — Progress Notes (Signed)
PT Hydrotherapy Cancellation Note  Patient Details Name: Joshua Park MRN: [id number] DOB: 05/17/1971   Cancelled Treatment:    Reason Eval/Treat Not Completed: Attempted x2 for hydrotherapy session. Pt unable to get pain medication due to low BP, and declining to attempt hydrotherapy treatment without medication. Pt repeatedly asked for therapist to come back at 1:00 pm which was not able to be accommodated as there was no availability. This was explained to the patient and he declined hydrotherapy today. Will attempt again tomorrow.    Thelma Comp 07/03/2016, 1:17 PM   Rolinda Roan, PT, DPT Acute Rehabilitation Services Pager: [phone]

## 2016-07-03 NOTE — Consult Note (Addendum)
[place] nurse has assessed all of the patient's wounds and discussed in detail with patient the severity of his wounds.  I have also contacted the Hospitalist on Tuesday 07/01/16 to notify the service that the patient wishes were to proceed with hydrotherapy and use of enzymatic debridement agent.  Patient has been refusing hydrotherapy, hygenic care and wound care.  I do not have any other suggestions for wound care for this patient because 50% of his wounds are necrotic.  He must have debridement and maximization of his nutritional status to have any chance of healing any of his wounds.  I feel that residential hospice care may be his best option at this point.  If so we could switch him to Dakins's solution for the necrotic wounds which would work as chemical debridement and also aid in lessening the odor of the necrotic wounds.  He does have low air loss mattress in place for pressure redistribution and would need this in a Hospice facility if possible.   Javonne Louissaint [facility] MSN, [place], [employer], [place]

## 2016-07-03 NOTE — Progress Notes (Addendum)
Pharmacy Antibiotic Note  Joshua Park is a 45 y.o. male admitted on 06/30/2016 with sacral osteomyelitis.  He was originally here for altered mental status/UTI. Of note, patient is paraplegic with a chronic foley inserted. While undergoing CT abdomen for abdominal pain, found to have sacral osteomyelitis. Patient is afebrile with normal WBC. Today is day 3 of vancomycin and zosyn. Renal function is difficult to determine based on creatinine; however, urine output is normal. Urine cultures now growing providencia and proteus. Blood cultures negative so far. Will switch zosyn to meropenem for better coverage of providencia. Continue vancomycin for now.   Plan: -Continue Vancomycin 500 mg IV q12h -Start Meropenem 1g IV q8h -Trend WBC, temp, renal function  -Drug levels as indicated   Temp (24hrs), Avg:98.2 F (36.8 C), Min:98 F (36.7 C), Max:98.5 F (36.9 C)   Recent Labs Lab 06/30/16 1920 06/30/16 1947 07/02/16 0617 07/02/16 1033 07/02/16 1223 07/02/16 1524 07/02/16 1824 07/03/16 0319 07/03/16 0904  WBC 8.7  --  7.8  --   --   --   --   --  7.8  CREATININE 0.55*  --  <0.30* <0.30*  --  <0.30* <0.30* <0.30*  --   LATICACIDVEN  --  1.60  --   --  0.8 0.7  --   --   --     CrCl cannot be calculated (This lab value cannot be used to calculate CrCl because it is not a number: <0.30).    No Known Allergies   Antimicrobials this admission:  Vancomycin 12/26>> Zosyn 12/26>>  Microbiology results:  12/26 blood x 2: NGx1day 12/26 urine: >100K providencia stuartii and proteus mirabilis; both sens to imipenem.  12/26 MRSA PCR Stark Klein, PharmD Clinical Pharmacy Resident [phone] (Pager) 07/03/2016 10:13 AM

## 2016-07-03 NOTE — Progress Notes (Signed)
Pts. B/P was 96/69. MD notified. Order for 1L NS bolus to be given. Orders completed. Will continue to monitor.

## 2016-07-04 ENCOUNTER — Inpatient Hospital Stay (HOSPITAL_COMMUNITY): Payer: Medicaid Other

## 2016-07-04 DIAGNOSIS — F329 Major depressive disorder, single episode, unspecified: Secondary | ICD-10-CM

## 2016-07-04 DIAGNOSIS — Z515 Encounter for palliative care: Secondary | ICD-10-CM

## 2016-07-04 DIAGNOSIS — F32A Depression, unspecified: Secondary | ICD-10-CM

## 2016-07-04 LAB — VANCOMYCIN, TROUGH

## 2016-07-04 MED ORDER — VANCOMYCIN HCL IN DEXTROSE 750-5 MG/150ML-% IV SOLN
750.0000 mg | Freq: Three times a day (TID) | INTRAVENOUS | Status: DC
  Administered 2016-07-05 – 2016-07-06 (×4): 750 mg via INTRAVENOUS
  Filled 2016-07-04 (×8): qty 150

## 2016-07-04 MED ORDER — MAGNESIUM HYDROXIDE 400 MG/5ML PO SUSP
30.0000 mL | Freq: Two times a day (BID) | ORAL | Status: AC
  Filled 2016-07-04 (×2): qty 30

## 2016-07-04 MED ORDER — METHYLNALTREXONE BROMIDE 12 MG/0.6ML ~~LOC~~ SOLN
12.0000 mg | SUBCUTANEOUS | Status: DC
  Administered 2016-07-04 – 2016-07-21 (×8): 12 mg via SUBCUTANEOUS
  Filled 2016-07-04 (×24): qty 0.6

## 2016-07-04 NOTE — Progress Notes (Signed)
Reviewed KUB, his abdominal pain is from ongoing severe constipation, this may also be contributing to hypotension- high density area in rectum needs to be evaluated. Recommendation remains for IR Gastrografin Lower GI- could consider GI consultation as well for further recommendations. Depression remains serious.  Other than symptom management and patient's need for psycho-social support there are no additional acute palliative needs until above issues are treated. Will follow patient as needed.  Lane Hacker, DO Palliative Medicine [phone]

## 2016-07-04 NOTE — Clinical Social Work Note (Signed)
CSW continuing to monitor patient's progress. Psych consult ordered and Palliative Care also involved. CSW will continue to follow and assist as needed with discharge plan.  Celese Banner Givens, MSW, LCSW Licensed Clinical Social Worker [place] [phone]

## 2016-07-04 NOTE — Progress Notes (Signed)
Physical Therapy Wound Treatment Patient Details  Name: Joshua Park MRN: [id number] Date of Birth: November 02, 1970  Today's Date: 07/04/2016 Time: 3716-9678 Time Calculation (min): 64 min  Subjective  Subjective: Pt was premedicated and agreeable to hydrotherapyt Patient and Family Stated Goals: Heal wounds Date of Onset:  (Unknown) Prior Treatments:  (Unknown - previous hydrotherapy)  Pain Score:  Pt was premedicated but still had pain during session.   Wound Assessment  Pressure Injury 07/01/16 Unstageable - Full thickness tissue loss in which the base of the ulcer is covered by slough (yellow, tan, gray, green or brown) and/or eschar (tan, brown or black) in the wound bed. Trochanter (Active)  Dressing Type ABD;Barrier Film (skin prep);Gauze (Comment);Moist to dry 07/04/2016  1:29 PM  Dressing Clean;Dry;Intact 07/04/2016  1:29 PM  Dressing Change Frequency Daily 07/04/2016  1:29 PM  State of Healing Eschar 07/04/2016  1:29 PM  Site / Wound Assessment Dressing in place / Unable to assess 07/04/2016  1:29 PM  % Wound base Red or Granulating 0% 07/04/2016  1:29 PM  % Wound base Yellow/Fibrinous Exudate 0% 07/04/2016  1:29 PM  % Wound base Black/Eschar 100% 07/04/2016  1:29 PM  % Wound base Other/Granulation Tissue (Comment) 0% 07/04/2016  1:29 PM  Wound Length (cm) 3 cm 07/01/2016  3:24 PM  Wound Width (cm) 1.5 cm 07/01/2016  3:24 PM  Wound Depth (cm) 0.1 cm 07/01/2016  3:24 PM  Margins Unattached edges (unapproximated) 07/04/2016  1:29 PM  Drainage Amount Minimal 07/04/2016  1:29 PM  Drainage Description Serosanguineous 07/04/2016  1:29 PM  Treatment Debridement (Selective);Hydrotherapy (Pulse lavage);Packing (Dry gauze) 07/04/2016  1:29 PM   Santyl applied to wound bed prior to applying dressing.    Pressure Injury 07/01/16 (Active)  Dressing Type ABD;Barrier Film (skin prep);Gauze (Comment);Moist to dry 07/04/2016  1:29 PM  Dressing Clean;Dry;Intact 07/04/2016  1:29 PM   Dressing Change Frequency Daily 07/04/2016  1:29 PM  State of Healing Eschar 07/04/2016  1:29 PM  Site / Wound Assessment Dressing in place / Unable to assess 07/04/2016  1:29 PM  % Wound base Red or Granulating 0% 07/04/2016  1:29 PM  % Wound base Yellow/Fibrinous Exudate 0% 07/04/2016  1:29 PM  % Wound base Black/Eschar 100% 07/04/2016  1:29 PM  % Wound base Other/Granulation Tissue (Comment) 0% 07/04/2016  1:29 PM  Peri-wound Assessment Intact 07/04/2016  1:29 PM  Wound Length (cm) 7 cm 07/01/2016  3:24 PM  Wound Width (cm) 6 cm 07/01/2016  3:24 PM  Wound Depth (cm) 3 cm 07/01/2016  3:24 PM  Margins Unattached edges (unapproximated) 07/04/2016  1:29 PM  Drainage Amount Minimal 07/04/2016  1:29 PM  Drainage Description Serosanguineous 07/04/2016  1:29 PM  Treatment Debridement (Selective);Hydrotherapy (Pulse lavage);Packing (Saline gauze) 07/04/2016  1:29 PM   Santyl applied to wound bed prior to applying dressing.    Pressure Injury 07/01/16 (Active)  Dressing Type ABD;Barrier Film (skin prep);Gauze (Comment);Moist to dry 07/04/2016  1:29 PM  Dressing Clean;Dry;Intact 07/04/2016  1:29 PM  Dressing Change Frequency Daily 07/04/2016  1:29 PM  State of Healing Eschar 07/04/2016  1:29 PM  Site / Wound Assessment Dressing in place / Unable to assess 07/04/2016  1:29 PM  % Wound base Red or Granulating 90% 07/04/2016  1:29 PM  % Wound base Yellow/Fibrinous Exudate 5% 07/04/2016  1:29 PM  % Wound base Black/Eschar 5% 07/04/2016  1:29 PM  Peri-wound Assessment Intact 07/04/2016  1:29 PM  Wound Length (cm) 16 cm 07/01/2016  3:24 PM  Wound Width (cm) 7.5 cm 07/01/2016  3:24 PM  Wound Depth (cm) 1.5 cm 07/01/2016  3:24 PM  Margins Unattached edges (unapproximated) 07/04/2016  1:29 PM  Drainage Amount Minimal 07/04/2016  1:29 PM  Drainage Description Serosanguineous 07/04/2016  1:29 PM  Treatment Debridement (Selective);Hydrotherapy (Pulse lavage);Packing (Saline gauze) 07/04/2016  1:29 PM    Santyl applied to wound bed prior to applying dressing.    Pressure Injury 07/01/16 (Active)  Dressing Type ABD;Barrier Film (skin prep);Gauze (Comment);Moist to dry 07/04/2016  1:29 PM  Dressing Clean;Dry;Intact 07/04/2016  1:29 PM  Dressing Change Frequency Daily 07/04/2016  1:29 PM  State of Healing Eschar 07/04/2016  1:29 PM  Site / Wound Assessment Dressing in place / Unable to assess 07/04/2016  1:29 PM  % Wound base Red or Granulating 50% 07/04/2016  1:29 PM  % Wound base Yellow/Fibrinous Exudate 50% 07/04/2016  1:29 PM  % Wound base Black/Eschar 0% 07/04/2016  1:29 PM  % Wound base Other/Granulation Tissue (Comment) 0% 07/04/2016  1:29 PM  Peri-wound Assessment Intact 07/04/2016  1:29 PM  Wound Length (cm) 5.2 cm 07/01/2016  3:24 PM  Wound Width (cm) 1.5 cm 07/01/2016  3:24 PM  Wound Depth (cm) 0.1 cm 07/01/2016  3:24 PM  Margins Unattached edges (unapproximated) 07/04/2016  1:29 PM  Drainage Amount Minimal 07/04/2016  1:29 PM  Drainage Description Serosanguineous 07/04/2016  1:29 PM  Treatment Debridement (Selective);Hydrotherapy (Pulse lavage);Packing (Saline gauze) 07/04/2016  1:29 PM  Santyl applied to wound bed prior to applying dressing.   Hydrotherapy Pulsed lavage therapy - wound location: sacrum, trochanter, calf, ischium Pulsed Lavage with Suction (psi): 12 psi Pulsed Lavage with Suction - Normal Saline Used: 1000 mL Pulsed Lavage Tip: Tip with splash shield Selective Debridement Selective Debridement - Location: sacrum, trochanter, calf, ischium Selective Debridement - Tools Used: Forceps;Scissors Selective Debridement - Tissue Removed: black and yellow necrotic tissue   Wound Assessment and Plan  Wound Therapy - Assess/Plan/Recommendations Wound Therapy - Clinical Statement: Noted R ischial tuberosity 100% black this session. Pt apparently refusing dressing changes, and dressing was extremely soiled when removed this session.  Wound Therapy - Functional  Problem List: Decreased tolerance for OOB Factors Delaying/Impairing Wound Healing: Altered sensation;Incontinence;Immobility Hydrotherapy Plan: Debridement;Dressing change;Patient/family education;Pulsatile lavage with suction Wound Therapy - Frequency: 6X / week Wound Therapy - Follow Up Recommendations: Skilled nursing facility Wound Plan: See above  Wound Therapy Goals- Improve the function of patient's integumentary system by progressing the wound(s) through the phases of wound healing (inflammation - proliferation - remodeling) by: Decrease Necrotic Tissue to: 25% grossly Decrease Necrotic Tissue - Progress: Progressing toward goal Increase Granulation Tissue to: 75% grossly Increase Granulation Tissue - Progress: Progressing toward goal Goals/treatment plan/discharge plan were made with and agreed upon by patient/family: Yes Time For Goal Achievement: 2 weeks Wound Therapy - Potential for Goals: Fair  Goals will be updated until maximal potential achieved or discharge criteria met.  Discharge criteria: when goals achieved, discharge from hospital, MD decision/surgical intervention, no progress towards goals, refusal/missing three consecutive treatments without notification or medical reason.  GP     Thelma Comp 07/04/2016, 1:45 PM   Rolinda Roan, PT, DPT Acute Rehabilitation Services Pager: [phone]
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
## 2016-07-04 NOTE — Progress Notes (Signed)
Pharmacy Antibiotic Note  Joshua Park is a 45 y.o. male admitted on 06/30/2016 with sacral osteomyelitis. Also has UTI with Providencia and Proteus.  Pharmacy has been consulted for Vancomycin and Meropenem dosing.  Changed from Zosyn to Meropenem on 12/28 due for better UTI coverage. Paraplegic, bedbound. Has chronic foley.  Vancomycin trough level is < 4 mcg/ml tonight on 500 mg IV q12hrs. Target trough levels 15-20 mcg/ml.   Plan:  Increase Vancomycin to 750 mg IV q8hrs.  Will plan to recheck Vancomycin trough level at steady state.  Continue Meropenem 1 gm IV q8hrs.  Follow renal function, final culture data, progress and plans.  Height: 6' (182.9 cm) Weight: 101 lb 13.6 oz (46.2 kg) IBW/kg (Calculated) : 77.6  Temp (24hrs), Avg:99.4 F (37.4 C), Min:98.9 F (37.2 C), Max:99.9 F (37.7 C)   Recent Labs Lab 06/30/16 1920 06/30/16 1947 07/02/16 0617 07/02/16 1033 07/02/16 1223 07/02/16 1524 07/02/16 1824 07/03/16 0319 07/03/16 0904 07/03/16 1056 07/04/16 2106  WBC 8.7  --  7.8  --   --   --   --   --  7.8  --   --   CREATININE 0.55*  --  <0.30* <0.30*  --  <0.30* <0.30* <0.30*  --  0.33*  --   LATICACIDVEN  --  1.60  --   --  0.8 0.7  --   --   --   --   --   VANCOTROUGH  --   --   --   --   --   --   --   --   --   --  <4*    Estimated Creatinine Clearance: 76.2 mL/min (by C-G formula based on SCr of 0.33 mg/dL (L)).    No Known Allergies  Antimicrobials this admission:  Vancomycin 12/26>>  Zosyn 12/26>>12/28  Meropenem 12/28>>   Dose adjustments this admission:  12/29 Vanc trough < 4 mcg/ml on 500 mg IV q12hrs -> increased to 750 mg IV q8hrs  Microbiology results: 12/26 blood x 2: no growth x 3 days to date 12/26 urine: >100K/ml of both Providencia stuartii and Proteus mirabilis; both sens to imipenem.  12/26 MRSA PCR - positive -> on CHG baths and Bactroban nasal x 5 days  Thank you for allowing pharmacy to be a part of this patient's  care.  Arty Baumgartner, [place] Pager: [phone] 07/04/2016 10:48 PM

## 2016-07-05 LAB — CBC
HCT: 22.1 % — ABNORMAL LOW (ref 39.0–52.0)
Hemoglobin: 7 g/dL — ABNORMAL LOW (ref 13.0–17.0)
MCH: 26.1 pg (ref 26.0–34.0)
MCHC: 31.7 g/dL (ref 30.0–36.0)
MCV: 82.5 fL (ref 78.0–100.0)
PLATELETS: 322 10*3/uL (ref 150–400)
RBC: 2.68 MIL/uL — AB (ref 4.22–5.81)
RDW: 16.8 % — AB (ref 11.5–15.5)
WBC: 6.9 10*3/uL (ref 4.0–10.5)

## 2016-07-05 LAB — COMPREHENSIVE METABOLIC PANEL
ALT: 8 U/L — ABNORMAL LOW (ref 17–63)
AST: 13 U/L — AB (ref 15–41)
Albumin: 1.3 g/dL — ABNORMAL LOW (ref 3.5–5.0)
Alkaline Phosphatase: 51 U/L (ref 38–126)
Anion gap: 4 — ABNORMAL LOW (ref 5–15)
CHLORIDE: 105 mmol/L (ref 101–111)
CO2: 26 mmol/L (ref 22–32)
Calcium: 7.4 mg/dL — ABNORMAL LOW (ref 8.9–10.3)
Creatinine, Ser: <0.3 mg/dL — ABNORMAL LOW (ref 0.61–1.24)
Glucose, Bld: 125 mg/dL — ABNORMAL HIGH (ref 65–99)
POTASSIUM: 3.5 mmol/L (ref 3.5–5.1)
Sodium: 135 mmol/L (ref 135–145)
Total Bilirubin: 0.6 mg/dL (ref 0.3–1.2)
Total Protein: 5 g/dL — ABNORMAL LOW (ref 6.5–8.1)

## 2016-07-05 LAB — DIFFERENTIAL
Basophils Absolute: 0 10*3/uL (ref 0.0–0.1)
Basophils Relative: 0 %
EOS PCT: 1 %
Eosinophils Absolute: 0.1 10*3/uL (ref 0.0–0.7)
LYMPHS ABS: 1.7 10*3/uL (ref 0.7–4.0)
LYMPHS PCT: 25 %
MONOS PCT: 9 %
Monocytes Absolute: 0.6 10*3/uL (ref 0.1–1.0)
NEUTROS PCT: 65 %
Neutro Abs: 4.3 10*3/uL (ref 1.7–7.7)

## 2016-07-05 LAB — CORTISOL: Cortisol, Plasma: 8.4 ug/dL

## 2016-07-05 MED ORDER — FLUOXETINE HCL 20 MG PO CAPS
20.0000 mg | ORAL_CAPSULE | Freq: Every day | ORAL | Status: DC
  Administered 2016-07-05 – 2016-07-13 (×7): 20 mg via ORAL
  Filled 2016-07-05 (×9): qty 1

## 2016-07-05 MED ORDER — LINACLOTIDE 145 MCG PO CAPS
145.0000 ug | ORAL_CAPSULE | Freq: Every day | ORAL | Status: DC
  Administered 2016-07-06 – 2016-07-17 (×5): 145 ug via ORAL
  Filled 2016-07-05 (×14): qty 1

## 2016-07-05 MED ORDER — MAGNESIUM OXIDE 400 (241.3 MG) MG PO TABS
400.0000 mg | ORAL_TABLET | Freq: Every day | ORAL | Status: AC
  Administered 2016-07-05 – 2016-07-06 (×2): 400 mg via ORAL
  Filled 2016-07-05 (×2): qty 1

## 2016-07-05 MED ORDER — SORBITOL 70 % SOLN
960.0000 mL | TOPICAL_OIL | Freq: Once | ORAL | Status: AC
  Administered 2016-07-05: 960 mL via RECTAL
  Filled 2016-07-05: qty 240

## 2016-07-05 MED ORDER — OLANZAPINE 5 MG PO TBDP
5.0000 mg | ORAL_TABLET | Freq: Every day | ORAL | Status: DC
  Administered 2016-07-06 (×2): 5 mg via ORAL
  Filled 2016-07-05 (×2): qty 1
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
## 2016-07-05 NOTE — Progress Notes (Signed)
Administered enema as ordered. Resulted in moderate, hard stool x2. MD notified of results. No new orders obtained at this time. Educated patient on the importance of taking Miralax and stool softeners as ordered.

## 2016-07-05 NOTE — Plan of Care (Signed)
Problem: Skin Integrity: Goal: Risk for impaired skin integrity will decrease Outcome: Not Progressing Patient refuses to be turned q 2 hours despite education.   Problem: Bowel/Gastric: Goal: Will not experience complications related to bowel motility Outcome: Progressing Patient refuses PO medications for constipation, but accepted administration of suppository and enema as ordered.

## 2016-07-05 NOTE — Progress Notes (Signed)
PT Cancellation Note  Patient Details Name: Joshua Park MRN: [id number] DOB: 1971-06-23   Cancelled Treatment:    Reason Eval/Treat Not Completed: Pain limiting ability to participate.  Patient states "I can not tolerate that today".  Encouraged patient to allow hydrotherapy, explaining importance.  Patient continued to decline.  RN notified.   Despina Pole 07/05/2016, 9:47 PM Carita Pian. Sanjuana Kava, [facility] Acute Rehab Services Pager [phone]'
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
## 2016-07-06 LAB — CULTURE, BLOOD (ROUTINE X 2)
CULTURE: NO GROWTH
CULTURE: NO GROWTH

## 2016-07-06 LAB — BASIC METABOLIC PANEL
Anion gap: 4 — ABNORMAL LOW (ref 5–15)
CALCIUM: 7.2 mg/dL — AB (ref 8.9–10.3)
CO2: 27 mmol/L (ref 22–32)
Chloride: 105 mmol/L (ref 101–111)
GLUCOSE: 118 mg/dL — AB (ref 65–99)
Potassium: 3.8 mmol/L (ref 3.5–5.1)
Sodium: 136 mmol/L (ref 135–145)

## 2016-07-06 LAB — LACTIC ACID, PLASMA: Lactic Acid, Venous: 1.2 mmol/L (ref 0.5–1.9)

## 2016-07-06 MED ORDER — SORBITOL 70 % SOLN
960.0000 mL | TOPICAL_OIL | Freq: Once | ORAL | Status: DC
  Filled 2016-07-06: qty 240

## 2016-07-06 MED ORDER — VANCOMYCIN HCL IN DEXTROSE 750-5 MG/150ML-% IV SOLN
750.0000 mg | Freq: Three times a day (TID) | INTRAVENOUS | Status: DC
  Administered 2016-07-07 – 2016-07-08 (×6): 750 mg via INTRAVENOUS
  Filled 2016-07-06 (×9): qty 150

## 2016-07-06 NOTE — Progress Notes (Signed)
Patient refused Miralax, ProStat, Multivitamin, Bisacodyl Suppository, and Boost supplement this morning. MD notified. RN educated patient again on the importance of taking prescribed medications and how not taking medications can have adverse consequences on his bowel health. Patient again refused.  MD has ordered SMOG enema for 0930 this morning. Patient states that he will agree to enema later in the day, but refuses administration of enema at scheduled time. MD aware. No new orders.
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
## 2016-07-06 NOTE — Progress Notes (Signed)
Patient refused Miralax, ProStat, Multivitamin, and Boost supplement. Patient educated on importance of taking prescribed medication. Patient again refused. MD notified.

## 2016-07-06 NOTE — Progress Notes (Signed)
Patient refused enema as ordered despite education and encouragement. MD notified.  This RN and MD spoke with patient at bedside and patient stated that he would accept administration, but that it would be on the next shift.  Also, patient claimed that our water supply was "bad" and that it was "making his stomach [place]." MD has been notified of patient remarks.

## 2016-07-07 DIAGNOSIS — Z79899 Other long term (current) drug therapy: Secondary | ICD-10-CM

## 2016-07-07 DIAGNOSIS — E871 Hypo-osmolality and hyponatremia: Secondary | ICD-10-CM

## 2016-07-07 DIAGNOSIS — M4628 Osteomyelitis of vertebra, sacral and sacrococcygeal region: Secondary | ICD-10-CM

## 2016-07-07 DIAGNOSIS — Z87891 Personal history of nicotine dependence: Secondary | ICD-10-CM

## 2016-07-07 DIAGNOSIS — F329 Major depressive disorder, single episode, unspecified: Secondary | ICD-10-CM

## 2016-07-07 DIAGNOSIS — Z803 Family history of malignant neoplasm of breast: Secondary | ICD-10-CM

## 2016-07-07 DIAGNOSIS — Z833 Family history of diabetes mellitus: Secondary | ICD-10-CM

## 2016-07-07 DIAGNOSIS — Z8249 Family history of ischemic heart disease and other diseases of the circulatory system: Secondary | ICD-10-CM

## 2016-07-07 DIAGNOSIS — Z79891 Long term (current) use of opiate analgesic: Secondary | ICD-10-CM

## 2016-07-07 DIAGNOSIS — G9340 Encephalopathy, unspecified: Secondary | ICD-10-CM

## 2016-07-07 LAB — MAGNESIUM: Magnesium: 1.4 mg/dL — ABNORMAL LOW (ref 1.7–2.4)

## 2016-07-07 LAB — CBC
HEMATOCRIT: 26.8 % — AB (ref 39.0–52.0)
Hemoglobin: 8.4 g/dL — ABNORMAL LOW (ref 13.0–17.0)
MCH: 25.8 pg — ABNORMAL LOW (ref 26.0–34.0)
MCHC: 31.3 g/dL (ref 30.0–36.0)
MCV: 82.5 fL (ref 78.0–100.0)
Platelets: 375 10*3/uL (ref 150–400)
RBC: 3.25 MIL/uL — ABNORMAL LOW (ref 4.22–5.81)
RDW: 17.3 % — ABNORMAL HIGH (ref 11.5–15.5)
WBC: 6.6 10*3/uL (ref 4.0–10.5)

## 2016-07-07 LAB — TYPE AND SCREEN
ABO/RH(D): A POS
Antibody Screen: NEGATIVE

## 2016-07-07 LAB — BASIC METABOLIC PANEL
Anion gap: 4 — ABNORMAL LOW (ref 5–15)
CHLORIDE: 101 mmol/L (ref 101–111)
CO2: 28 mmol/L (ref 22–32)
Calcium: 7.9 mg/dL — ABNORMAL LOW (ref 8.9–10.3)
Creatinine, Ser: <0.3 mg/dL — ABNORMAL LOW (ref 0.61–1.24)
GLUCOSE: 85 mg/dL (ref 65–99)
POTASSIUM: 4.3 mmol/L (ref 3.5–5.1)
Sodium: 133 mmol/L — ABNORMAL LOW (ref 135–145)

## 2016-07-07 MED ORDER — OLANZAPINE 5 MG PO TBDP
5.0000 mg | ORAL_TABLET | Freq: Two times a day (BID) | ORAL | Status: DC
  Administered 2016-07-07 – 2016-07-17 (×9): 5 mg via ORAL
  Filled 2016-07-07 (×23): qty 1

## 2016-07-07 MED ORDER — MAGNESIUM OXIDE 400 (241.3 MG) MG PO TABS
400.0000 mg | ORAL_TABLET | Freq: Every day | ORAL | Status: AC
  Administered 2016-07-07 – 2016-07-08 (×2): 400 mg via ORAL
  Filled 2016-07-07 (×2): qty 1

## 2016-07-07 MED ORDER — MAGNESIUM SULFATE 2 GM/50ML IV SOLN
2.0000 g | Freq: Once | INTRAVENOUS | Status: AC
  Administered 2016-07-07: 2 g via INTRAVENOUS
  Filled 2016-07-07: qty 50

## 2016-07-07 NOTE — Progress Notes (Signed)
Physical Therapy Wound Treatment Patient Details  Name: LONI ABDON MRN: [id number] Date of Birth: 06-27-1971  Today's Date: 07/07/2016 Time: 1005-1055 Time Calculation (min): 50 min  Subjective  Subjective: Pt was premedicated and agreeable to hydrotherapy Patient and Family Stated Goals: Heal wounds Date of Onset:  (Unknown) Prior Treatments:  (Unknown - previous hydrotherapy)  Pain Score:  Pt was premedicated however not able to tolerate sharp debridement this session.   Wound Assessment  Pressure Injury 07/01/16 Unstageable - Full thickness tissue loss in which the base of the ulcer is covered by slough (yellow, tan, gray, green or brown) and/or eschar (tan, brown or black) in the wound bed. Trochanter (Active)  Dressing Type ABD 07/07/2016  2:00 PM  Dressing Clean;Dry;Intact 07/07/2016  2:00 PM  Dressing Change Frequency Daily 07/07/2016  2:00 PM  State of Healing Eschar 07/07/2016  2:00 PM  Site / Wound Assessment Yellow;Black 07/07/2016  2:00 PM  % Wound base Red or Granulating 0% 07/07/2016  2:00 PM  % Wound base Yellow/Fibrinous Exudate 60% 07/07/2016  2:00 PM  % Wound base Black/Eschar 40% 07/07/2016  2:00 PM  % Wound base Other/Granulation Tissue (Comment) 0% 07/07/2016  2:00 PM  Wound Length (cm) 3 cm 07/01/2016  3:24 PM  Wound Width (cm) 1.5 cm 07/01/2016  3:24 PM  Wound Depth (cm) 0.1 cm 07/01/2016  3:24 PM  Margins Unattached edges (unapproximated) 07/07/2016  2:00 PM  Drainage Amount Minimal 07/07/2016  2:00 PM  Drainage Description Serosanguineous 07/07/2016  2:00 PM  Treatment Hydrotherapy (Pulse lavage);Packing (Saline gauze) 07/07/2016  2:00 PM   Santyl applied to wound bed prior to applying dressing.    Pressure Injury 07/01/16 (Active)  Dressing Type ABD 07/07/2016  2:00 PM  Dressing Clean;Dry;Intact 07/07/2016  2:00 PM  Dressing Change Frequency Daily 07/07/2016  2:00 PM  State of Healing Eschar 07/07/2016  2:00 PM  Site / Wound Assessment Red;Yellow;Black 07/07/2016  2:00 PM  % Wound  base Red or Granulating 50% 07/07/2016  2:00 PM  % Wound base Yellow/Fibrinous Exudate 30% 07/07/2016  2:00 PM  % Wound base Black/Eschar 20% 07/07/2016  2:00 PM  % Wound base Other/Granulation Tissue (Comment) 0% 07/07/2016  2:00 PM  Peri-wound Assessment Intact 07/07/2016  2:00 PM  Wound Length (cm) 7 cm 07/01/2016  3:24 PM  Wound Width (cm) 6 cm 07/01/2016  3:24 PM  Wound Depth (cm) 3 cm 07/01/2016  3:24 PM  Margins Unattached edges (unapproximated) 07/07/2016  2:00 PM  Drainage Amount Minimal 07/07/2016  2:00 PM  Drainage Description Serosanguineous 07/07/2016  2:00 PM  Treatment Hydrotherapy (Pulse lavage);Packing (Saline gauze) 07/07/2016  2:00 PM   Santyl applied to wound bed prior to applying dressing.   Pressure Injury 07/01/16 (Active)  Dressing Type ABD;Barrier Film (skin prep);Gauze (Comment);Moist to dry 07/07/2016  2:00 PM  Dressing Clean;Dry;Intact 07/07/2016  2:00 PM  Dressing Change Frequency Daily 07/07/2016  2:00 PM  State of Healing Early/partial granulation 07/07/2016  2:00 PM  Site / Wound Assessment Red;Yellow 07/07/2016  2:00 PM  % Wound base Red or Granulating 90% 07/07/2016  2:00 PM  % Wound base Yellow/Fibrinous Exudate 10% 07/07/2016  2:00 PM  % Wound base Black/Eschar 0% 07/07/2016  2:00 PM  Peri-wound Assessment Intact 07/07/2016  2:00 PM  Wound Length (cm) 16 cm 07/01/2016  3:24 PM  Wound Width (cm) 7.5 cm 07/01/2016  3:24 PM  Wound Depth (cm) 1.5 cm 07/01/2016  3:24 PM  Margins Unattached edges (unapproximated) 07/07/2016  2:00 PM  Drainage Amount Minimal 07/07/2016  2:00 PM  Drainage Description Serosanguineous 07/07/2016  2:00 PM  Treatment Hydrotherapy (Pulse lavage);Packing (Saline gauze) 07/07/2016  2:00 PM   Santyl applied to wound bed prior to applying dressing.    Pressure Injury 07/01/16 (Active)  Dressing Type ABD;Barrier Film (skin prep);Gauze (Comment);Moist to dry 07/07/2016  2:00 PM  Dressing Clean;Dry;Intact 07/07/2016  2:00 PM  Dressing Change Frequency Daily 07/07/2016  2:00 PM   State of Healing Eschar 07/07/2016  2:00 PM  Site / Wound Assessment Red;Yellow 07/07/2016  2:00 PM  % Wound base Red or Granulating 80% 07/07/2016  2:00 PM  % Wound base Yellow/Fibrinous Exudate 20% 07/07/2016  2:00 PM  % Wound base Black/Eschar 0% 07/07/2016  2:00 PM  % Wound base Other/Granulation Tissue (Comment) 0% 07/07/2016  2:00 PM  Peri-wound Assessment Intact 07/07/2016  2:00 PM  Wound Length (cm) 5.2 cm 07/01/2016  3:24 PM  Wound Width (cm) 1.5 cm 07/01/2016  3:24 PM  Wound Depth (cm) 0.1 cm 07/01/2016  3:24 PM  Margins Unattached edges (unapproximated) 07/07/2016  2:00 PM  Drainage Amount Minimal 07/07/2016  2:00 PM  Drainage Description Serosanguineous 07/07/2016  2:00 PM  Treatment Hydrotherapy (Pulse lavage);Packing (Saline gauze) 07/07/2016  2:00 PM  Santyl applied to wound bed prior to applying dressing.   Hydrotherapy Pulsed lavage therapy - wound location: sacrum, trochanter, calf, ischium Pulsed Lavage with Suction (psi): 12 psi Pulsed Lavage with Suction - Normal Saline Used: 1000 mL Pulsed Lavage Tip: Tip with splash shield Selective Debridement Selective Debridement - Location: Pt not agreeable to debridement this session   Wound Assessment and Plan  Wound Therapy - Assess/Plan/Recommendations Wound Therapy - Clinical Statement: Noting increased granulation tissue, however would benefit from continued hydrotherapy to decrease bioburden and promote wound bed healing. Wound Therapy - Functional Problem List: Decreased tolerance for OOB Factors Delaying/Impairing Wound Healing: Altered sensation;Incontinence;Immobility Hydrotherapy Plan: Debridement;Dressing change;Patient/family education;Pulsatile lavage with suction Wound Therapy - Frequency: 6X / week Wound Therapy - Follow Up Recommendations: Skilled nursing facility Wound Plan: See above  Wound Therapy Goals- Improve the function of patient's integumentary system by progressing the wound(s) through the phases of wound healing  (inflammation - proliferation - remodeling) by: Decrease Necrotic Tissue to: 25% grossly Decrease Necrotic Tissue - Progress: Progressing toward goal Increase Granulation Tissue to: 75% grossly Increase Granulation Tissue - Progress: Progressing toward goal Goals/treatment plan/discharge plan were made with and agreed upon by patient/family: Yes Time For Goal Achievement: 2 weeks Wound Therapy - Potential for Goals: Fair  Goals will be updated until maximal potential achieved or discharge criteria met.  Discharge criteria: when goals achieved, discharge from hospital, MD decision/surgical intervention, no progress towards goals, refusal/missing three consecutive treatments without notification or medical reason.  GP     Thelma Comp 07/07/2016, 2:34 PM  Rolinda Roan, PT, DPT Acute Rehabilitation Services Pager: [phone]
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
## 2016-07-07 NOTE — Progress Notes (Signed)
Pharmacy Antibiotic Note  Joshua Park is a 46 y.o. male admitted on 06/30/2016 with sacral osteomyelitis. Also has UTI with Providencia and Proteus.  Pharmacy has been consulted for Vancomycin and Meropenem dosing. Paraplegic, bedbound. Has chronic foley. -Target trough levels 15-20 mcg/ml -WBC wnl, Tmax 100.36F in last 24 hours -Scr <0.3 (paraplegia) -UOP 2.6 ml/kg/hr  Plan: -Continue Vancomycin 750 mg IV q8h -Continue meropenem 1gm IV q8h -VT 1/2 AM @1130  -Monitor UOP, cultures, abx LOT  Height: 6' (182.9 cm) Weight: 101 lb 13.6 oz (46.2 kg) IBW/kg (Calculated) : 77.6  Temp (24hrs), Avg:99.8 F (37.7 C), Min:98.9 F (37.2 C), Max:100.7 F (38.2 C)   Recent Labs Lab 06/30/16 1920 06/30/16 1947 07/02/16 0617  07/02/16 1223 07/02/16 1524  07/03/16 0319 07/03/16 0904 07/03/16 1056 07/04/16 2106 07/05/16 0809 07/06/16 0331 07/07/16 0830  WBC 8.7  --  7.8  --   --   --   --   --  7.8  --   --  6.9  --   --   CREATININE 0.55*  --  <0.30*  < >  --  <0.30*  < > <0.30*  --  0.33*  --  <0.30* <0.30* <0.30*  LATICACIDVEN  --  1.60  --   --  0.8 0.7  --   --   --   --   --   --  1.2  --   VANCOTROUGH  --   --   --   --   --   --   --   --   --   --  <4*  --   --   --   < > = values in this interval not displayed.  CrCl cannot be calculated (This lab value cannot be used to calculate CrCl because it is not a number: <0.30).    No Known Allergies  Antimicrobials this admission:  Vancomycin 12/26>>  Zosyn 12/26>>12/28  Meropenem 12/28>>   Dose adjustments this admission:  12/29 Vanc trough < 4 mcg/ml on 500 mg IV q12hrs -> increased to 750 mg IV q8hrs  Microbiology results: 12/26 blood x 2: no growth 12/26 urine: >100K providencia stuartii and proteus mirabilis; both sens to imipenem.  12/26 MRSA PCR positive  Thank you for allowing pharmacy to be a part of this patient's care.  Myer Peer Grayland Ormond), PharmD  PGY1 Pharmacy Resident Pager: [phone] 07/07/2016 12:01  PM

## 2016-07-07 NOTE — Progress Notes (Signed)
Patient refused SMOG enema tonight,states " I'll do it later",offered again x2, patient refused. Yarel Kilcrease, [name], Therapist, sports

## 2016-07-07 NOTE — Progress Notes (Signed)
With lot of education and explanation patient agreed to do the enema but insisted to be given on the next shift.

## 2016-07-08 LAB — LACTIC ACID, PLASMA: LACTIC ACID, VENOUS: 0.8 mmol/L (ref 0.5–1.9)

## 2016-07-08 LAB — BASIC METABOLIC PANEL
Anion gap: 12 (ref 5–15)
BUN: <5 mg/dL — ABNORMAL LOW (ref 6–20)
CHLORIDE: 98 mmol/L — AB (ref 101–111)
CO2: 23 mmol/L (ref 22–32)
Calcium: 8.1 mg/dL — ABNORMAL LOW (ref 8.9–10.3)
Glucose, Bld: 99 mg/dL (ref 65–99)
Potassium: 3.8 mmol/L (ref 3.5–5.1)
Sodium: 133 mmol/L — ABNORMAL LOW (ref 135–145)

## 2016-07-08 LAB — MAGNESIUM: Magnesium: 1.7 mg/dL (ref 1.7–2.4)

## 2016-07-08 MED ORDER — MAGNESIUM OXIDE 400 (241.3 MG) MG PO TABS
400.0000 mg | ORAL_TABLET | Freq: Every day | ORAL | Status: DC
  Administered 2016-07-08 – 2016-07-17 (×4): 400 mg via ORAL
  Filled 2016-07-08 (×5): qty 1

## 2016-07-08 NOTE — Progress Notes (Addendum)
Pharmacy Antibiotic Note  Joshua Park is a 46 y.o. male admitted on 06/30/2016 with sacral osteomyelitis. Also has UTI with Providencia and Proteus.  Pharmacy has been consulted for Vancomycin and Meropenem dosing. Paraplegic, bedbound. Has chronic foley. -Target trough levels 15-20 mcg/ml -WBC wnl, Tmax 100.58F in last 24 hours -Scr <0.3 (paraplegia) -UOP 2.6 ml/kg/hr  Plan: -Continue Vancomycin 750 mg IV q8h -Continue meropenem 1gm IV q8h -VT 1/3 at 5:30 am -Monitor UOP, cultures, abx LOT  Height: 6' (182.9 cm) Weight: 101 lb 13.6 oz (46.2 kg) IBW/kg (Calculated) : 77.6  Temp (24hrs), Avg:98.6 F (37 C), Min:98.2 F (36.8 C), Max:98.9 F (37.2 C)   Recent Labs Lab 07/02/16 0617  07/02/16 1223 07/02/16 1524  07/03/16 0319 07/03/16 0904 07/03/16 1056 07/04/16 2106 07/05/16 0809 07/06/16 0331 07/07/16 0830 07/07/16 1145  WBC 7.8  --   --   --   --   --  7.8  --   --  6.9  --   --  6.6  CREATININE <0.30*  < >  --  <0.30*  < > <0.30*  --  0.33*  --  <0.30* <0.30* <0.30*  --   LATICACIDVEN  --   --  0.8 0.7  --   --   --   --   --   --  1.2  --   --   VANCOTROUGH  --   --   --   --   --   --   --   --  <4*  --   --   --   --   < > = values in this interval not displayed.    No Known Allergies  Antimicrobials this admission:  Vancomycin 12/26>>  Zosyn 12/26>>12/28  Meropenem 12/28>>   Dose adjustments this admission:  12/29 Vanc trough < 4 mcg/ml on 500 mg IV q12hrs -> increased to 750 mg IV q8hrs  Microbiology results: 12/26 blood x 2: no growth 12/26 urine: >100K providencia stuartii and proteus mirabilis; both sens to imipenem.  12/26 MRSA PCR positive  Thank you for allowing pharmacy to be a part of this patient's care.  Myer Peer Grayland Ormond), PharmD  PGY1 Pharmacy Resident Pager: [phone] 07/08/2016 1:31 PM    ADDENDUM: Vanc trough this am 13, below goal.  Will increase to vanc 1g Q8H for calculated trough ~17. Wynona Neat, PharmD, BCPS 07/09/2016  6:31 AM
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
## 2016-07-08 NOTE — Progress Notes (Signed)
Patient refused this morning to do enema but agreed to do in the afternoon.

## 2016-07-08 NOTE — Progress Notes (Signed)
Patient refused to check on him if he had any bowel movement and refused to turn to get the wound specimen for culture and also for dressing change.  He says he didn't move any bowels and he is dry.  Will make aware of the situation to the oncoming nurse and will continue to monitor.

## 2016-07-08 NOTE — Progress Notes (Signed)
With the help of 3 nurses able to administered enema but patient insisted to get only half of the bottle, tried to convince him during the administration to give more but patient refused.  While administering medium amount stool which was hard came out of the rectum.  Dr. Erlinda Hong made aware of the situation.  Will continue to monitor.
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
## 2016-07-08 NOTE — Progress Notes (Signed)
Physical Therapy Wound Treatment Patient Details  Name: Joshua Park MRN: [id number] Date of Birth: 05/12/1971  Today's Date: 07/08/2016 Time: 1100-1200 Time Calculation (min): 60 min  Subjective  Subjective: Pt was premedicated and agreeable to hydrotherapy Patient and Family Stated Goals: Heal wounds Date of Onset:  (unknown) Prior Treatments:  (unknown - previous hydrotherapy)  Pain Score:  Pt very painful throughout session. Not able to debride due to pain.  Wound Assessment  Pressure Injury 07/01/16 Unstageable - Full thickness tissue loss in which the base of the ulcer is covered by slough (yellow, tan, gray, green or brown) and/or eschar (tan, brown or black) in the wound bed. Trochanter (Active)  Dressing Type ABD;Barrier Film (skin prep);Gauze (Comment);Moist to dry 07/08/2016 12:52 PM  Dressing Clean;Dry;Intact 07/08/2016 12:52 PM  Dressing Change Frequency Daily 07/08/2016 12:52 PM  State of Healing Eschar 07/08/2016 12:52 PM  Site / Wound Assessment Black;Brown;Yellow 07/08/2016 12:52 PM  % Wound base Red or Granulating 0% 07/08/2016 12:52 PM  % Wound base Yellow/Fibrinous Exudate 70% 07/08/2016 12:52 PM  % Wound base Black/Eschar 30% 07/08/2016 12:52 PM  % Wound base Other/Granulation Tissue (Comment) 0% 07/08/2016 12:52 PM  Wound Length (cm) 3 cm 07/01/2016  3:24 PM  Wound Width (cm) 1.5 cm 07/01/2016  3:24 PM  Wound Depth (cm) 0.1 cm 07/01/2016  3:24 PM  Margins Unattached edges (unapproximated) 07/08/2016 12:52 PM  Drainage Amount Moderate 07/08/2016 12:52 PM  Drainage Description Purulent 07/08/2016 12:52 PM  Treatment Hydrotherapy (Pulse lavage);Packing (Saline gauze) 07/08/2016 12:52 PM   Santyl applied to wound bed prior to applying dressing.    Pressure Injury 07/01/16 (Active)  Dressing Type ABD;Barrier Film (skin prep);Gauze (Comment);Moist to dry 07/08/2016 12:52 PM  Dressing Clean;Dry;Intact 07/08/2016 12:52 PM  Dressing Change Frequency Daily 07/08/2016 12:52 PM  State of Healing  Eschar 07/08/2016 12:52 PM  Site / Wound Assessment Black;Brown;Yellow;Pink 07/08/2016 12:52 PM  % Wound base Red or Granulating 50% 07/08/2016 12:52 PM  % Wound base Yellow/Fibrinous Exudate 30% 07/08/2016 12:52 PM  % Wound base Black/Eschar 20% 07/08/2016 12:52 PM  % Wound base Other/Granulation Tissue (Comment) 0% 07/08/2016 12:52 PM  Peri-wound Assessment Intact 07/08/2016 12:52 PM  Wound Length (cm) 7 cm 07/01/2016  3:24 PM  Wound Width (cm) 6 cm 07/01/2016  3:24 PM  Wound Depth (cm) 3 cm 07/01/2016  3:24 PM  Margins Unattached edges (unapproximated) 07/08/2016 12:52 PM  Drainage Amount Minimal 07/08/2016 12:52 PM  Drainage Description Serosanguineous 07/08/2016 12:52 PM  Treatment Hydrotherapy (Pulse lavage);Packing (Saline gauze) 07/08/2016 12:52 PM   Santyl applied to wound bed prior to applying dressing.    Pressure Injury 07/01/16 (Active)  Dressing Type ABD;Barrier Film (skin prep);Gauze (Comment);Moist to dry 07/08/2016 12:52 PM  Dressing Clean;Dry;Intact 07/08/2016 12:52 PM  Dressing Change Frequency Daily 07/08/2016 12:52 PM  State of Healing Early/partial granulation 07/08/2016 12:52 PM  Site / Wound Assessment Red;Yellow 07/08/2016 12:52 PM  % Wound base Red or Granulating 90% 07/08/2016 12:52 PM  % Wound base Yellow/Fibrinous Exudate 10% 07/08/2016 12:52 PM  % Wound base Black/Eschar 0% 07/08/2016 12:52 PM  Peri-wound Assessment Intact 07/07/2016  2:00 PM  Wound Length (cm) 16 cm 07/01/2016  3:24 PM  Wound Width (cm) 7.5 cm 07/01/2016  3:24 PM  Wound Depth (cm) 1.5 cm 07/01/2016  3:24 PM  Margins Unattached edges (unapproximated) 07/08/2016 12:52 PM  Drainage Amount Minimal 07/08/2016 12:52 PM  Drainage Description Serosanguineous 07/08/2016 12:52 PM  Treatment Hydrotherapy (Pulse lavage);Packing (Saline gauze) 07/08/2016 12:52 PM     Santyl applied to wound bed prior to applying dressing.    Pressure Injury 07/01/16 (Active)  Dressing Type ABD;Barrier Film (skin prep);Gauze (Comment);Moist to dry 07/08/2016 12:52  PM  Dressing Clean;Dry;Intact 07/08/2016 12:52 PM  Dressing Change Frequency Daily 07/08/2016 12:52 PM  State of Healing Early/partial granulation 07/08/2016 12:52 PM  Site / Wound Assessment Red;Yellow 07/08/2016 12:52 PM  % Wound base Red or Granulating 80% 07/08/2016 12:52 PM  % Wound base Yellow/Fibrinous Exudate 20% 07/08/2016 12:52 PM  % Wound base Black/Eschar 0% 07/08/2016 12:52 PM  % Wound base Other/Granulation Tissue (Comment) 0% 07/08/2016 12:52 PM  Peri-wound Assessment Intact 07/08/2016 12:52 PM  Wound Length (cm) 5.2 cm 07/01/2016  3:24 PM  Wound Width (cm) 1.5 cm 07/01/2016  3:24 PM  Wound Depth (cm) 0.1 cm 07/01/2016  3:24 PM  Margins Unattached edges (unapproximated) 07/08/2016 12:52 PM  Drainage Amount Minimal 07/08/2016 12:52 PM  Drainage Description Serosanguineous 07/08/2016 12:52 PM  Treatment Hydrotherapy (Pulse lavage);Packing (Saline gauze) 07/08/2016 12:52 PM  Santyl applied to wound bed prior to applying dressing.   Hydrotherapy Pulsed lavage therapy - wound location: sacrum, trochanter, calf, ischium Pulsed Lavage with Suction (psi): 12 psi Pulsed Lavage with Suction - Normal Saline Used: 1000 mL Pulsed Lavage Tip: Tip with splash shield Selective Debridement Selective Debridement - Location: Pt not agreeable to debridement this session   Wound Assessment and Plan  Wound Therapy - Assess/Plan/Recommendations Wound Therapy - Clinical Statement: Noting increased granulation tissue, however would benefit from continued hydrotherapy to decrease bioburden and promote wound bed healing.  Wound Therapy - Functional Problem List: Decreased tolerance for OOB Factors Delaying/Impairing Wound Healing: Altered sensation;Incontinence;Immobility Hydrotherapy Plan: Debridement;Dressing change;Patient/family education;Pulsatile lavage with suction Wound Therapy - Frequency: 6X / week Wound Therapy - Follow Up Recommendations: Skilled nursing facility Wound Plan: See above  Wound Therapy  Goals- Improve the function of patient's integumentary system by progressing the wound(s) through the phases of wound healing (inflammation - proliferation - remodeling) by: Decrease Necrotic Tissue to: 25% grossly Decrease Necrotic Tissue - Progress: Progressing toward goal Increase Granulation Tissue to: 75% grossly Increase Granulation Tissue - Progress: Progressing toward goal Goals/treatment plan/discharge plan were made with and agreed upon by patient/family: Yes Time For Goal Achievement: 2 weeks Wound Therapy - Potential for Goals: Good  Goals will be updated until maximal potential achieved or discharge criteria met.  Discharge criteria: when goals achieved, discharge from hospital, MD decision/surgical intervention, no progress towards goals, refusal/missing three consecutive treatments without notification or medical reason.  GP      D  07/08/2016, 1:00 PM    , PT, DPT Acute Rehabilitation Services Pager: [phone]    

## 2016-07-09 LAB — BASIC METABOLIC PANEL
Anion gap: 6 (ref 5–15)
CALCIUM: 8 mg/dL — AB (ref 8.9–10.3)
CO2: 27 mmol/L (ref 22–32)
Chloride: 100 mmol/L — ABNORMAL LOW (ref 101–111)
GLUCOSE: 92 mg/dL (ref 65–99)
POTASSIUM: 4 mmol/L (ref 3.5–5.1)
SODIUM: 133 mmol/L — AB (ref 135–145)

## 2016-07-09 LAB — MAGNESIUM: MAGNESIUM: 1.6 mg/dL — AB (ref 1.7–2.4)

## 2016-07-09 LAB — VANCOMYCIN, TROUGH: Vancomycin Tr: 13 ug/mL — ABNORMAL LOW (ref 15–20)

## 2016-07-09 LAB — SEDIMENTATION RATE: Sed Rate: 110 mm/hr — ABNORMAL HIGH (ref 0–16)

## 2016-07-09 LAB — PREALBUMIN: Prealbumin: 5.4 mg/dL — ABNORMAL LOW (ref 18–38)

## 2016-07-09 LAB — C-REACTIVE PROTEIN: CRP: 12.2 mg/dL — AB (ref <1.0)

## 2016-07-09 MED ORDER — ENSURE ENLIVE PO LIQD: 237.0000 mL | ORAL | Status: DC

## 2016-07-09 MED ORDER — SORBITOL 70 % SOLN
960.0000 mL | TOPICAL_OIL | Freq: Once | ORAL | Status: AC
  Administered 2016-07-09: 960 mL via RECTAL
  Filled 2016-07-09: qty 240

## 2016-07-09 MED ORDER — VANCOMYCIN HCL IN DEXTROSE 1-5 GM/200ML-% IV SOLN
1000.0000 mg | Freq: Three times a day (TID) | INTRAVENOUS | Status: DC
  Administered 2016-07-09 – 2016-07-10 (×4): 1000 mg via INTRAVENOUS
  Filled 2016-07-09 (×5): qty 200

## 2016-07-09 MED ORDER — OXYCODONE HCL 5 MG PO TABS
5.0000 mg | ORAL_TABLET | Freq: Four times a day (QID) | ORAL | Status: DC | PRN
  Administered 2016-07-16 – 2016-07-23 (×5): 5 mg via ORAL
  Filled 2016-07-09 (×6): qty 1
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
## 2016-07-09 NOTE — Progress Notes (Signed)
Pt received SMOG enema and medium BM followed. Paged Dr. Wendee Beavers.  Pt is still refusing nutritional supplements and PO fluids.Pt is also not eating meal trays.   Will continue to monitor   Paulla Fore, RN

## 2016-07-09 NOTE — Progress Notes (Signed)
PT Hydrotherapy Cancellation Note  Patient Details Name: Joshua Park MRN: [id number] DOB: [birth year]   Cancelled Treatment:    Reason Eval/Treat Not Completed: Attempted to schedule hydrotherapy with pain medication today, however pt not agreeable to participate. Attempted treatment session x2. Will attempt again tomorrow.    Thelma Comp 07/09/2016, 1:23 PM  Rolinda Roan, PT, DPT Acute Rehabilitation Services Pager: [phone]
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
## 2016-07-10 NOTE — Progress Notes (Signed)
Patient still refusing to eat and drink at this time.  Sheliah Plane RN

## 2016-07-10 NOTE — Progress Notes (Signed)
Physical Therapy Wound Treatment Patient Details  Name: Joshua Park MRN: [id number] Date of Birth: [birth year]  Today's Date: 07/10/2016 Time: 7867-5449 Time Calculation (min): 40 min  Subjective  Subjective: Pt was premedicated and agreeable to hydrotherapy Patient and Family Stated Goals: Heal wounds Date of Onset:  (unknown) Prior Treatments:  (unknown - previous hydrotherapy)  Pain Score: Pt continues to be painful even with premedication.   Wound Assessment  Pressure Injury 07/01/16 Unstageable - Full thickness tissue loss in which the base of the ulcer is covered by slough (yellow, tan, gray, green or brown) and/or eschar (tan, brown or black) in the wound bed. Trochanter (Active)  Dressing Type ABD;Barrier Film (skin prep);Gauze (Comment);Moist to dry 07/10/2016  2:00 PM  Dressing Clean;Dry;Intact 07/10/2016  2:00 PM  Dressing Change Frequency Daily 07/10/2016  2:00 PM  State of Healing Eschar 07/10/2016  2:00 PM  Site / Wound Assessment Black;Brown;Yellow 07/10/2016  2:00 PM  % Wound base Red or Granulating 0% 07/10/2016  2:00 PM  % Wound base Yellow/Fibrinous Exudate 70% 07/10/2016  2:00 PM  % Wound base Black/Eschar 30% 07/10/2016  2:00 PM  % Wound base Other/Granulation Tissue (Comment) 0% 07/10/2016  2:00 PM  Wound Length (cm) 3 cm 07/01/2016  3:24 PM  Wound Width (cm) 1.5 cm 07/01/2016  3:24 PM  Wound Depth (cm) 0.1 cm 07/01/2016  3:24 PM  Margins Unattached edges (unapproximated) 07/10/2016  2:00 PM  Drainage Amount Moderate 07/10/2016  2:00 PM  Drainage Description Serosanguineous 07/10/2016  2:00 PM  Treatment Hydrotherapy (Pulse lavage);Packing (Saline gauze) 07/10/2016  2:00 PM   Santyl applied to wound bed prior to applying dressing.    Pressure Injury 07/01/16 (Active)  Dressing Type ABD;Barrier Film (skin prep);Gauze (Comment);Moist to dry 07/10/2016  2:00 PM  Dressing Clean;Dry;Intact 07/10/2016  2:00 PM  Dressing Change Frequency Daily 07/10/2016  2:00 PM  State of Healing Eschar  07/10/2016  2:00 PM  Site / Wound Assessment Black;Brown;Yellow;Pink 07/10/2016  2:00 PM  % Wound base Red or Granulating 20% 07/10/2016  2:00 PM  % Wound base Yellow/Fibrinous Exudate 60% 07/10/2016  2:00 PM  % Wound base Black/Eschar 20% 07/10/2016  2:00 PM  % Wound base Other/Granulation Tissue (Comment) 0% 07/10/2016  2:00 PM  Peri-wound Assessment Intact 07/10/2016  2:00 PM  Wound Length (cm) 7 cm 07/01/2016  3:24 PM  Wound Width (cm) 6 cm 07/01/2016  3:24 PM  Wound Depth (cm) 3 cm 07/01/2016  3:24 PM  Margins Unattached edges (unapproximated) 07/10/2016  2:00 PM  Drainage Amount Minimal 07/10/2016  2:00 PM  Drainage Description Serosanguineous 07/10/2016  2:00 PM  Treatment Hydrotherapy (Pulse lavage);Packing (Saline gauze) 07/10/2016  2:00 PM   Santyl applied to wound bed prior to applying dressing.    Pressure Injury 07/01/16 (Active)  Dressing Type ABD;Gauze (Comment);Barrier Film (skin prep);Moist to dry 07/10/2016  2:00 PM  Dressing Clean;Dry;Intact 07/10/2016  2:00 PM  Dressing Change Frequency Daily 07/10/2016  2:00 PM  State of Healing Early/partial granulation 07/10/2016  2:00 PM  Site / Wound Assessment Red;Yellow 07/10/2016  2:00 PM  % Wound base Red or Granulating 65% 07/10/2016  2:00 PM  % Wound base Yellow/Fibrinous Exudate 5% 07/10/2016  2:00 PM  % Wound base Black/Eschar 30% 07/10/2016  2:00 PM  Peri-wound Assessment Intact 07/10/2016  2:00 PM  Wound Length (cm) 16 cm 07/01/2016  3:24 PM  Wound Width (cm) 7.5 cm 07/01/2016  3:24 PM  Wound Depth (cm) 1.5 cm 07/01/2016  3:24 PM  Margins  Unattached edges (unapproximated) 07/10/2016  2:00 PM  Drainage Amount Minimal 07/10/2016  2:00 PM  Drainage Description Serosanguineous 07/10/2016  2:00 PM  Treatment Debridement (Selective);Hydrotherapy (Pulse lavage);Packing (Saline gauze) 07/10/2016  2:00 PM   Santyl applied to wound bed prior to applying dressing.    Pressure Injury 07/01/16 (Active)  Dressing Type ABD;Barrier Film (skin prep);Gauze (Comment);Moist to  dry 07/10/2016  2:00 PM  Dressing Clean;Dry;Intact 07/10/2016  2:00 PM  Dressing Change Frequency Daily 07/10/2016  2:00 PM  State of Healing Early/partial granulation 07/10/2016  2:00 PM  Site / Wound Assessment Red;Yellow 07/10/2016  2:00 PM  % Wound base Red or Granulating 80% 07/10/2016  2:00 PM  % Wound base Yellow/Fibrinous Exudate 20% 07/10/2016  2:00 PM  % Wound base Black/Eschar 0% 07/10/2016  2:00 PM  % Wound base Other/Granulation Tissue (Comment) 0% 07/10/2016  2:00 PM  Peri-wound Assessment Intact 07/10/2016  2:00 PM  Wound Length (cm) 5.2 cm 07/01/2016  3:24 PM  Wound Width (cm) 1.5 cm 07/01/2016  3:24 PM  Wound Depth (cm) 0.1 cm 07/01/2016  3:24 PM  Margins Unattached edges (unapproximated) 07/10/2016  2:00 PM  Drainage Amount Minimal 07/10/2016  2:00 PM  Drainage Description Serosanguineous 07/10/2016  2:00 PM  Treatment Hydrotherapy (Pulse lavage);Packing (Saline gauze) 07/10/2016  2:00 PM  Santyl applied to wound bed prior to applying dressing.   Hydrotherapy Pulsed lavage therapy - wound location: sacrum, trochanter, calf, ischium Pulsed Lavage with Suction (psi): 12 psi Pulsed Lavage with Suction - Normal Saline Used: 1000 mL Pulsed Lavage Tip: Tip with splash shield Selective Debridement Selective Debridement - Location: Pt only agreeable to debridement on sacrum this session. New large area of black eschar developed since last hydro session.  Selective Debridement - Tools Used: Forceps;Scalpel Selective Debridement - Tissue Removed: black necrotic tissue   Wound Assessment and Plan  Wound Therapy - Assess/Plan/Recommendations Wound Therapy - Clinical Statement: Melody, WOC present to assess wounds during session. Hydrotherapy to sign off at this time.  Wound Therapy - Functional Problem List: Decreased tolerance for OOB Factors Delaying/Impairing Wound Healing: Altered sensation;Incontinence;Immobility Hydrotherapy Plan: Debridement;Dressing change;Patient/family education;Pulsatile  lavage with suction Wound Therapy - Frequency: 6X / week Wound Therapy - Follow Up Recommendations: Skilled nursing facility Wound Plan: See above  Wound Therapy Goals- Improve the function of patient's integumentary system by progressing the wound(s) through the phases of wound healing (inflammation - proliferation - remodeling) by: Decrease Necrotic Tissue to: 25% grossly Decrease Necrotic Tissue - Progress: Not progressing Increase Granulation Tissue to: 75% grossly Increase Granulation Tissue - Progress: Mot progressing Goals/treatment plan/discharge plan were made with and agreed upon by patient/family: Yes Time For Goal Achievement: 2 weeks Wound Therapy - Potential for Goals: Good  Goals will be updated until maximal potential achieved or discharge criteria met.  Discharge criteria: when goals achieved, discharge from hospital, MD decision/surgical intervention, no progress towards goals, refusal/missing three consecutive treatments without notification or medical reason.  GP     Thelma Comp 07/10/2016, 3:05 PM   Rolinda Roan, PT, DPT Acute Rehabilitation Services Pager: [phone]
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
## 2016-07-10 NOTE — Care Management Note (Signed)
Case Management Note  Patient Details  Name: Joshua Park MRN: [id number] Date of Birth: 1971-03-08  Subjective/Objective:     CM following for progression and d/c planning.                Action/Plan: 07/10/2016 Pt discussed at [place] meeting at length on 07/10/16. Would like to reeval this pt with Pall care and determine if we need to move forward with an Ethics committee meeting to assist in plan of care as pt has been agreeable to enema only after 5 days of discussion and attempts to negotiate with pt by attending and nursing staff. Pt continues to decline care, refuse medications and nutrition. If he is d/c back to the SNF, he will obviously be readmitted to the hospital where he continues to refuse care.  We are unable to justify acute hospitalization as he continues to refuse all acute care and interventions.    Expected Discharge Date:                  Expected Discharge Plan:  Skilled Nursing Facility  In-House Referral:  Clinical Social Work  Discharge planning Services  CM Consult  Post Acute Care Choice:  NA Choice offered to:  NA  DME Arranged:    DME Agency:     HH Arranged:    [place] Agency:     Status of Service:  In process, will continue to follow  If discussed at Long Length of Stay Meetings, dates discussed:    Additional Comments:  Adron Bene, RN 07/10/2016, 2:37 PM

## 2016-07-10 NOTE — Consult Note (Signed)
[facility] Nurse wound follow up Wound type: multiple pressure injuries POA, malnutrition (albumin 1.3 on 07/05/16), depression, paranoia Measurement: see PT notes Wound [id number] sacral, ishcial, trochanter wounds today with PT during hydrotherapy Drainage (amount, consistency, odor) moderate to purulent from the large ischial and sacral wounds, less noted from other wound legs/scapula/spinal Periwound: intact Dressing procedure/placement/frequency: I think it is a futile effort to continue hydrotherapy at this point, patient is refusing multiple treatments and the wounds are not going to heal in the setting of such severe malnutrition and also this patient is now refusing to eat.  He has chronic systemic pain and movement is challenging for him.  Continue low air loss mattress for pressure redistribution. Continue enzymatic debridement for the remaining necrotic tissue, however it will be a lengthy process and I do not feel that the wounds will ever be free from necrotic tissue.  I think comfort care for this patient at this point would be the most ethical thing in the setting of massive pressure injuries with necrosis and malnutrition.    [place] Nurse team will follow along with you for weekly wound assessments.  Please notify me of any acute changes in the wounds or any new areas of concerns [place] MSN, Joice, CNS [phone]

## 2016-07-11 NOTE — Progress Notes (Signed)
Pt refuse PO night meds and IV abx stated" if I hang any fluids I will take my IV out"Explained to pt the need of IV abx,he still refused will continue to monitor
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
## 2016-07-11 NOTE — Progress Notes (Signed)
Patient refused all medications with the exception of pain meds. Refused to be repositioned in bed and refused wound care at this time, will continue to ask if he changes his mind.

## 2016-07-11 NOTE — Clinical Social Work Note (Signed)
CSW provided update to [facility] liaison) regarding patient status and Palliative meeting. CSW will continue to monitor patient's progress, provided support and any SW services as needed, and assist with discharge once disposition determined.  Ambrea Hegler Givens, MSW, LCSW Licensed Clinical Social Worker [place] [phone]

## 2016-07-11 NOTE — Progress Notes (Signed)
Requested to re-evaluate patient today. Medical and psychiatric complexity. Patient is refusing medical treatment which may lead to his death and substandard treatment/care. If he is not agreeing to treatment and care, I do not believe that he will agree to comfort care and allowing for a natural death to occur- he is already paranoid someone is trying to kill him. This is a disconnect-he may however be dying from his infections and at a basic level we should provide pain control since he is willing to accept this.  On 07/08/2015 he was seen by psychiatry who stated due to paranoia and mental status was not able to make his own medical decisions. Given this we have the following options:  1. Forced Care (at least for hygiene purposes and infection control and administration of concealed medication) which would likely require involuntary commitment. Including administration of psych meds injectables if needed for his paranoia etc. Would like psych to weigh in on this.  2. Pursue a Guardian, his mother is involved in his care I will reach out to he and see what her thoughts and feelings are on her sons situation- she is the surrogate decision maker at this point, but not legal guardian? Could CSW pursue more informatio on this, the record indicates there may be a wife and children?  3. Ethics: Need a specific question for the ethics committee- other than the issue of forced care/treatment  vs. Comfort care/no treatment and the problem this presents in the setting of uncontrolled mental illness,. Im not sure how much more can be added.   4. Recommend starting IV Depakote (Psych can weigh in but we use this in palliative for mood stabilization and unusual behaviors in geriatric population).   Encourage nursing to administer xyprexa ODT even if verbal refusal from the patient ie. In food, with another staff assistance. Give patient choices that are clear, to my knowledge he has not had combative or aggressive  behavior.  Will update chart once I speak with his mother and determine next steps.   Lane Hacker, DO Palliative Medicine Total Time: 35 minutes Greater than 50%  of this time was spent counseling and coordinating care related to the above assessment and plan.

## 2016-07-11 NOTE — Progress Notes (Addendum)
Pharmacy Antibiotic Note  Joshua Park is a 46 y.o. male admitted on 06/30/2016 with sacral osteomyelitis. Also has UTI with Providencia and Proteus.   Wound culture with acinetobacter  Vancomycin stopped 1/4  Plan: Continue meropenem 1 gram iv Q 8 hours - > Day #9 of therapy Follow up for plan  Height: 6' (182.9 cm) Weight: 128 lb 11.2 oz (58.4 kg) IBW/kg (Calculated) : 77.6  Temp (24hrs), Avg:98.3 F (36.8 C), Min:98 F (36.7 C), Max:98.7 F (37.1 C)   Recent Labs Lab 07/04/16 2106 07/05/16 0809 07/06/16 0331 07/07/16 0830 07/07/16 1145 07/08/16 1811 07/09/16 0543  WBC  --  6.9  --   --  6.6  --   --   CREATININE  --  <0.30* <0.30* <0.30*  --  <0.30* <0.30*  LATICACIDVEN  --   --  1.2  --   --  0.8  --   VANCOTROUGH <4*  --   --   --   --   --  13*      No Known Allergies Thank you Anette Guarneri, PharmD [phone]   07/11/2016 10:17 AM

## 2016-07-12 DIAGNOSIS — Z1624 Resistance to multiple antibiotics: Secondary | ICD-10-CM | POA: Diagnosis not present

## 2016-07-12 DIAGNOSIS — A488 Other specified bacterial diseases: Secondary | ICD-10-CM

## 2016-07-12 DIAGNOSIS — A498 Other bacterial infections of unspecified site: Secondary | ICD-10-CM | POA: Diagnosis not present

## 2016-07-12 DIAGNOSIS — A499 Bacterial infection, unspecified: Secondary | ICD-10-CM | POA: Diagnosis not present

## 2016-07-12 MED ORDER — TIGECYCLINE 50 MG IV SOLR
100.0000 mg | Freq: Once | INTRAVENOUS | Status: AC
  Administered 2016-07-12: 100 mg via INTRAVENOUS
  Filled 2016-07-12: qty 100

## 2016-07-12 MED ORDER — DEXTROSE 5 % IV SOLN
1.2500 mg/kg | Freq: Two times a day (BID) | INTRAVENOUS | Status: DC
  Administered 2016-07-12 – 2016-07-13 (×2): 73 mg via INTRAVENOUS
  Filled 2016-07-12 (×4): qty 730000

## 2016-07-12 MED ORDER — POLYMYXIN B SULFATE 500000 UNITS IJ SOLR: 0.7500 mg/kg | Freq: Two times a day (BID) | INTRAVENOUS | Status: DC

## 2016-07-12 MED ORDER — SORBITOL 70 % SOLN
960.0000 mL | TOPICAL_OIL | Freq: Once | ORAL | Status: DC
  Filled 2016-07-12: qty 240

## 2016-07-12 MED ORDER — TIGECYCLINE 50 MG IV SOLR
50.0000 mg | Freq: Two times a day (BID) | INTRAVENOUS | Status: DC
  Administered 2016-07-13 – 2016-07-17 (×9): 50 mg via INTRAVENOUS
  Filled 2016-07-12 (×11): qty 100

## 2016-07-12 NOTE — Progress Notes (Signed)
CRITICAL VALUE ALERT  Critical value received:  Sacral culture results  Date of notification:  07/12/16  Time of notification:  0926  Critical value read back:Yes.    Nurse who received alert:  HDavis  MD notified (1st page):  Wendee Beavers  Time of first page:  1000  MD notified (2nd page):  Time of second page:  Responding MD:    Time MD responded:
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
## 2016-07-12 NOTE — Progress Notes (Signed)
Patient refused dressing changes this AM, also refused SMOG enema at this time. States he may try again later. Will continue to monitor.

## 2016-07-13 DIAGNOSIS — Z1624 Resistance to multiple antibiotics: Secondary | ICD-10-CM

## 2016-07-13 DIAGNOSIS — A499 Bacterial infection, unspecified: Secondary | ICD-10-CM

## 2016-07-13 NOTE — NC FL2 (Signed)
[place] LEVEL OF CARE SCREENING TOOL     IDENTIFICATION  Patient Name: Joshua Park Birthdate: 12/18/1970 Sex: male Admission Date (Current Location): 06/30/2016  [facility] and [place] Number:  Herbalist and Address:  [facility], [place] [address], [place], [place] [address]      Provider Number: [id number]  Attending Physician Name and Address:  Velvet Bathe, MD  Relative Name and Phone Number:  Mother Karilyn Cota [phone]    Current Level of Care: Hospital Recommended Level of Care: [place] Prior Approval Number:    Date Approved/Denied:   PASRR Number: [id number]  Discharge Plan: SNF    Current Diagnoses: Patient Active Problem List   Diagnosis Date Noted  . 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 Palliative care by specialist   . Depression   . Sacral osteomyelitis ([place]) 07/01/2016  . Hyponatremia 07/01/2016  . Osteomyelitis ([place]) 07/01/2016  . Acute encephalopathy 07/01/2016  . Community acquired pneumonia   . Sacral wound   . CAP (community acquired pneumonia) 04/16/2016  . Failure to thrive in adult 12/05/2015  . Abdominal pain   . Chest pain 11/03/2015  . Abnormal EKG 11/03/2015  . Severe episode of recurrent major depressive disorder, with psychotic features ([place])   . Hypokalemia 11/02/2015  . Presence of IVC filter 11/01/2015  . Pressure ulcer of left foot, stage 4 ([place]) 09/03/2015  . Refusal of care by patient 08/28/2015  . Chronic indwelling Foley catheter 06/27/2015  . Hypotension 06/27/2015  . 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 MDD (major depressive disorder), recurrent, severe, with psychosis ([place]) 03/16/2015  . Protein-calorie malnutrition, severe ([place]) 03/16/2015  . Chronic pain syndrome 02/13/2015  . Paranoia (psychosis) ([place]) 12/17/2014  . GERD (gastroesophageal reflux disease) 03/21/2014  . UTI (urinary tract  infection) 07/20/2013  . Constipation 05/14/2012  . Paraplegia ([place]) 02/09/2007  . Neurogenic bladder 02/09/2007    Orientation RESPIRATION BLADDER Height & Weight     Self, Time, Situation, Place    Incontinent Weight: 128 lb 11.2 oz (58.4 kg) Height:  6' (182.9 cm)  BEHAVIORAL SYMPTOMS/MOOD NEUROLOGICAL BOWEL NUTRITION STATUS      Incontinent    AMBULATORY STATUS COMMUNICATION OF NEEDS Skin   Total Care Verbally Normal                       Personal Care Assistance Level of Assistance  Total care       Total Care Assistance: Maximum assistance   Functional Limitations Info             SPECIAL CARE FACTORS FREQUENCY                       Contractures Contractures Info: Not present    Additional Factors Info  Code Status, Allergies Code Status Info: DNR Allergies Info: No known allergies           Current Medications (07/13/2016):  This is the current hospital active medication list Current Facility-Administered Medications  Medication Dose Route Frequency Provider Last Rate Last Dose  . 0.9 % NaCl with KCl 40 mEq / L  infusion   Intravenous Continuous Florencia Reasons, MD 75 mL/hr at 07/12/16 2212 75 mL/hr at 07/12/16 2212  . acetaminophen (TYLENOL) tablet 650 mg  650 mg Oral Q6H PRN Ivor Costa, MD      . baclofen (LIORESAL) tablet 20 mg  20 mg Oral  TID AC & HS Ivor Costa, MD   20 mg at 07/13/16 1321  . bisacodyl (DULCOLAX) suppository 10 mg  10 mg Rectal Daily Florencia Reasons, MD   10 mg at 07/09/16 1421  . collagenase (SANTYL) ointment   Topical Daily Ripudeep Krystal Eaton, MD   1 application at [phone]  . enoxaparin (LOVENOX) injection 30 mg  30 mg Subcutaneous Daily Ivor Costa, MD   30 mg at 07/10/16 0957  . feeding supplement (BOOST / RESOURCE BREEZE) liquid 1 Container  1 Container Oral TID BM Ivor Costa, MD   1 Container at 07/04/16 1019  . feeding supplement (ENSURE ENLIVE) (ENSURE ENLIVE) liquid 237 mL  237 mL Oral Q24H Velvet Bathe, MD      . feeding supplement  (PRO-STAT SUGAR FREE 64) liquid 30 mL  30 mL Oral BID WC Ivor Costa, MD   30 mL at 07/09/16 0825  . FLUoxetine (PROZAC) capsule 20 mg  20 mg Oral Daily Cloria Spring, MD   20 mg at 07/13/16 0936  . gabapentin (NEURONTIN) capsule 300 mg  300 mg Oral TID AC & HS Ivor Costa, MD   300 mg at 07/13/16 1321  . HYDROmorphone (DILAUDID) injection 1 mg  1 mg Intravenous Q4H PRN Ripudeep Krystal Eaton, MD   1 mg at 07/01/16 1616  . linaclotide (LINZESS) capsule 145 mcg  145 mcg Oral QAC breakfast Florencia Reasons, MD   145 mcg at 07/13/16 0935  . LORazepam (ATIVAN) tablet 1 mg  1 mg Oral Q4H PRN Ivor Costa, MD      . magnesium oxide (MAG-OX) tablet 400 mg  400 mg Oral Daily Florencia Reasons, MD   400 mg at 07/10/16 0956  . methylnaltrexone (RELISTOR) injection 12 mg  12 mg Subcutaneous Q24H Acquanetta Chain, DO   12 mg at 07/07/16 1743  . mirtazapine (REMERON SOL-TAB) disintegrating tablet 30 mg  30 mg Oral QHS Acquanetta Chain, DO   30 mg at 07/12/16 2213  . multivitamin with minerals tablet 1 tablet  1 tablet Oral Daily Ivor Costa, MD   1 tablet at 07/04/16 1021  . OLANZapine zydis (ZYPREXA) disintegrating tablet 5 mg  5 mg Oral BID Cloria Spring, MD   5 mg at 07/13/16 0935  . ondansetron (ZOFRAN) injection 4 mg  4 mg Intravenous Q8H PRN Ivor Costa, MD      . oxyCODONE (Oxy IR/ROXICODONE) immediate release tablet 20 mg  20 mg Oral Q4H while awake Acquanetta Chain, DO   20 mg at 07/13/16 1500  . oxyCODONE (Oxy IR/ROXICODONE) immediate release tablet 5 mg  5 mg Oral Q6H PRN Velvet Bathe, MD      . polyethylene glycol (MIRALAX / GLYCOLAX) packet 17 g  17 g Oral Daily Ivor Costa, MD   17 g at 07/04/16 1020  . polymyxin B 73 mg in dextrose 5 % 500 mL IVPB  1.25 mg/kg Intravenous Q12H Michel Bickers, MD   73 mg at 07/13/16 0935   Followed by  . [START ON 08/23/2016] polymyxin B 43.8 mg in dextrose 5 % 500 mL IVPB  0.75 mg/kg Intravenous Q12H Michel Bickers, MD      . potassium chloride SA (K-DUR,KLOR-CON) CR tablet 40 mEq  40 mEq Oral  Once Jeryl Columbia, NP      . senna-docusate (Senokot-S) tablet 2 tablet  2 tablet Oral BID Florencia Reasons, MD   2 tablet at 07/13/16 0936  . sodium chloride flush (NS) 0.9 %  injection 3 mL  3 mL Intravenous Q12H Ivor Costa, MD   3 mL at 07/13/16 1323  . sorbitol, milk of mag, mineral oil, glycerin (SMOG) enema  960 mL Rectal Once Velvet Bathe, MD      . tigecycline (TYGACIL) 50 mg in sodium chloride 0.9 % 100 mL IVPB  50 mg Intravenous Q12H Michel Bickers, MD   50 mg at 07/13/16 0943  . zolpidem (AMBIEN) tablet 5 mg  5 mg Oral QHS PRN Ivor Costa, MD         Discharge Medications: Please see discharge summary for a list of discharge medications.  Relevant Imaging Results:  Relevant Lab Results:   Additional Information [phone] [id number] [phone] (SSN)  Velera Lansdale B, LCSWA
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
## 2016-07-13 NOTE — Progress Notes (Signed)
Patient ID: Joshua Park, male   DOB: [birth year], 46 y.o.   MRN: [id number]          [facility]    Date of Admission:  06/30/2016           Day 1 tigecycline and polymyxin B         Mr. Spurlock has allowed his nurse to administer his new IV antibiotics but he continues to refuse dressing changes and nutritional supplements. Even if his new antibiotic regimen is more effective against Acinetobacter this is unlikely to cure his infection and he'll his chronic sacral wounds without significantly improved nutritional status and wound care. I recommend continuing discussion about goals of care.  Michel Bickers, MD [facility] [place] [facility] [phone] pager   [phone] cell 07/10/2015, 1:32 PM

## 2016-07-14 LAB — MISC LABCORP TEST (SEND OUT): Labcorp test code: 96388

## 2016-07-14 LAB — BASIC METABOLIC PANEL
Anion gap: 8 (ref 5–15)
BUN: <5 mg/dL — ABNORMAL LOW (ref 6–20)
CHLORIDE: 100 mmol/L — AB (ref 101–111)
CO2: 25 mmol/L (ref 22–32)
Calcium: 8.2 mg/dL — ABNORMAL LOW (ref 8.9–10.3)
Creatinine, Ser: <0.3 mg/dL — ABNORMAL LOW (ref 0.61–1.24)
Glucose, Bld: 90 mg/dL (ref 65–99)
Potassium: 4.5 mmol/L (ref 3.5–5.1)
Sodium: 133 mmol/L — ABNORMAL LOW (ref 135–145)

## 2016-07-14 MED ORDER — POLYMYXIN B SULFATE 500000 UNITS IJ SOLR: 1.5000 mg/kg | Freq: Two times a day (BID) | INTRAVENOUS | Status: DC

## 2016-07-14 MED ORDER — SODIUM CHLORIDE 0.9 % IV BOLUS (SEPSIS)
500.0000 mL | Freq: Once | INTRAVENOUS | Status: AC
  Administered 2016-07-14: 500 mL via INTRAVENOUS

## 2016-07-14 MED ORDER — ENOXAPARIN SODIUM 30 MG/0.3ML ~~LOC~~ SOLN: 30.0000 mg | SUBCUTANEOUS | Status: DC

## 2016-07-14 MED ORDER — POLYMYXIN B SULFATE 500000 UNITS IJ SOLR
2.5000 mg/kg | Freq: Once | INTRAVENOUS | Status: DC
  Filled 2016-07-14: qty 1460000

## 2016-07-14 MED ORDER — POLYMYXIN B SULFATE 500000 UNITS IJ SOLR
1.5000 mg/kg | Freq: Two times a day (BID) | INTRAVENOUS | Status: DC
  Administered 2016-07-14 – 2016-07-18 (×9): 87.6 mg via INTRAVENOUS
  Filled 2016-07-14 (×10): qty 876000
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
## 2016-07-14 NOTE — Progress Notes (Signed)
Patient's BP 88/64,patient asymptomatic. MD on call text paged. Will continue to monitor. 07/15/16 00:10 Rechecked BP manually after 500cc NS bolus 102/64. Will continue to monitor. Meral Geissinger, [name], Therapist, sports

## 2016-07-14 NOTE — Progress Notes (Signed)
Pt refusing all medications this evening. States, "the antibiotic makes me feel funny. I don't want or need anything right now."
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
## 2016-07-14 NOTE — Progress Notes (Signed)
Patients mother would like Dr to call with an update. PH: [phone]. Her name is [facility]

## 2016-07-15 LAB — BASIC METABOLIC PANEL
ANION GAP: 10 (ref 5–15)
Anion gap: 5 (ref 5–15)
BUN: 7 mg/dL (ref 6–20)
BUN: 5 mg/dL — ABNORMAL LOW (ref 6–20)
CALCIUM: 7.7 mg/dL — AB (ref 8.9–10.3)
CALCIUM: 7.5 mg/dL — AB (ref 8.9–10.3)
CO2: 19 mmol/L — AB (ref 22–32)
CO2: 24 mmol/L (ref 22–32)
Chloride: 104 mmol/L (ref 101–111)
Chloride: 100 mmol/L — ABNORMAL LOW (ref 101–111)
Creatinine, Ser: 0.3 mg/dL — ABNORMAL LOW (ref 0.61–1.24)
GFR calc non Af Amer: >60 mL/min (ref >60)
Glucose, Bld: 87 mg/dL (ref 65–99)
Glucose, Bld: 86 mg/dL (ref 65–99)
Potassium: 6.2 mmol/L — ABNORMAL HIGH (ref 3.5–5.1)
Potassium: 6.4 mmol/L (ref 3.5–5.1)
SODIUM: 129 mmol/L — AB (ref 135–145)
Sodium: 133 mmol/L — ABNORMAL LOW (ref 135–145)

## 2016-07-15 LAB — TROPONIN I: Troponin I: <0.03 ng/mL (ref <0.03)

## 2016-07-15 MED ORDER — OXYCODONE HCL 5 MG PO TABS
10.0000 mg | ORAL_TABLET | Freq: Once | ORAL | Status: AC
  Administered 2016-07-15: 10 mg via ORAL

## 2016-07-15 MED ORDER — SODIUM POLYSTYRENE SULFONATE 15 GM/60ML PO SUSP: 30.0000 g | Freq: Once | ORAL | Status: DC

## 2016-07-15 MED ORDER — FUROSEMIDE 10 MG/ML IJ SOLN
40.0000 mg | Freq: Once | INTRAMUSCULAR | Status: AC
  Administered 2016-07-15: 40 mg via INTRAVENOUS
  Filled 2016-07-15: qty 4

## 2016-07-15 MED ORDER — SODIUM CHLORIDE 0.9 % IV SOLN
INTRAVENOUS | Status: DC
  Administered 2016-07-15 – 2016-07-16 (×2): via INTRAVENOUS

## 2016-07-15 NOTE — Progress Notes (Signed)
Patient c/o mid chest tightness. VS in epic. MD on call notified via text page,Rapid response RN called to evaluate patient.Will continue to monitor. Sylvanna Burggraf, [name], Therapist, sports
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
## 2016-07-15 NOTE — Progress Notes (Signed)
07/14/16 22:05 Patient refused foley care. Explained to patient why we need to do foley care. States " not right now". Will attempt again later. Joshua Park, [name], Therapist, sports

## 2016-07-15 NOTE — Progress Notes (Signed)
Spoke with Dr Hilma Favors Palliative regarding patient refusing all medications and treatments can be applied.. Palliative are unable to advise anything other than suggesting Dr Wendee Beavers needs to [facility] commit patient to psych so medications and treatment can be applied. Discussed situation with Case Worker Malachy Mood. CW is going to speak with Dr Wendee Beavers.

## 2016-07-15 NOTE — Care Management Note (Signed)
Case Management Note  Patient Details  Name: CLARNECE DORT MRN: [id number] Date of Birth: 1970/07/21  Subjective/Objective:    CM following for progression and d/c planning.                 Action/Plan: 07/15/16 Pt continuing to refuse treatment. K 6.4 and pt refusing kayexulate. Continuing to refuse multiple medications, enemas, laxatives and wound care.  Noted recommendation for invol commitment , however this pt is too medically unstable to be committed to a psy facility, clearly would not be accepted by a psy facility in his current state of health.  We are unable to force medications on him at this time and pt lacks capacity per psy.  This CM discussed with hospitalist, Dr Wendee Beavers, who will order IV Lasix in an attempt to lower K.   MD attempting to discuss with pt mother. This CM discuss with MD the possibility of comfort care for this pt.   Expected Discharge Date:                  Expected Discharge Plan:  Skilled Nursing Facility  In-House Referral:  Clinical Social Work  Discharge planning Services  CM Consult  Post Acute Care Choice:  NA Choice offered to:  NA  DME Arranged:    DME Agency:     HH Arranged:    [place] Agency:     Status of Service:  In process, will continue to follow  If discussed at Long Length of Stay Meetings, dates discussed:    Additional Comments:  Adron Bene, RN 07/15/2016, 5:11 PM

## 2016-07-15 NOTE — Progress Notes (Signed)
Patient ID: Joshua Park, male   DOB: [birth year], 46 y.o.   MRN: [id number]          [facility] for Infectious Disease    Date of Admission:  06/30/2016           Day 3 polymyxin and tigecycline  He denies having any problems tolerating his new antibiotics and he has allowed his nurse to give him all of his doses in the past 24 hours. Additional susceptibility results on his Acinetobacter isolate are still pending. I will continue current antibiotics for now but still believe that there should be ongoing discussions about goals of care. His nutritional status is very poor and he is not able or willing to offload pressure from his wounds. He is not allowing regular wound care. He tells me that he is not eating because he has had problems with nausea and vomiting. He has told his nurse that he is not eating because he is afraid to have a bowel movement.         Michel Bickers, MD [facility] [place] [facility] [phone] pager   [phone] cell 07/10/2015, 1:32 PM

## 2016-07-15 NOTE — Progress Notes (Signed)
Patient refused oral meds last night.He also refused for staff to check his bottom for any incontinence in stool. Shaheim Mahar, [name], Therapist, sports

## 2016-07-15 NOTE — Progress Notes (Signed)
While on 6E with another patient RN asked me to look at patient for CP. Pt denied CP to NT's during EKG, reported to me pain 3/10, described pain as tightness to midsternum area. Pt just finished NS Bolus for low BP. Advised RN to page NP for orders for patient due to low BP of 96/62. New orders for Troponin, results pending. Minimal interventions by RRRN
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
## 2016-07-16 LAB — BASIC METABOLIC PANEL
Anion gap: 7 (ref 5–15)
BUN: <5 mg/dL — ABNORMAL LOW (ref 6–20)
CHLORIDE: 90 mmol/L — AB (ref 101–111)
CO2: 24 mmol/L (ref 22–32)
Calcium: 7.2 mg/dL — ABNORMAL LOW (ref 8.9–10.3)
Creatinine, Ser: <0.3 mg/dL — ABNORMAL LOW (ref 0.61–1.24)
Glucose, Bld: 328 mg/dL — ABNORMAL HIGH (ref 65–99)
Potassium: 3 mmol/L — ABNORMAL LOW (ref 3.5–5.1)
Sodium: 121 mmol/L — ABNORMAL LOW (ref 135–145)

## 2016-07-16 NOTE — Progress Notes (Addendum)
Palliative Care Progress Note  "Joshua Park" continues to decline and refuse treatment. He remains paranoid and severely depressed and will not voluntarily take medication.  I spoke at length with his mother this evening about his history, care and current situation.  Additional Information: There is no formal HCPOA documentation known. He has been at [facility] for 2 years Was previously on Hospice for 3 months at [facility], was discharged (I will attempt to obtain the hospice records) Patient has 3 children 18,20,24- they will need to be included in decision making if he does not have capacity by [facility] law guidelines. Mother is primary caregiver decision maker currently. Paranoia started back in 12-01-2022 after his father's death His mother states that there may be someone who works in SNF that has a Wellsite geologist against his brother which has made him more paranoid.She has been cooking for him because he would not eat the food because of fear someone was poisoning him.  Mother, "I think my son just wants to die". I listened while she talked about his suffering and also heard that she is angry at him for not doing what he needs to do to get better, and the pain it is causing her.She tells me he has asked staff to give him too much medicine in the past.   Recommendations:  1. His mother is contacting his siblings and children- to see when they can come in for a meeting- we need all of them present so they can see what is going on and also help make decisions.  2. We need psych help and management- I cannot transition him to comfort care /allow for death to occur if his wish to die is actually a passive suicide attempt in the setting of major depression with psychotic features vs. True consent and a desire to be comfortable and refuse tx with informed consent.  3. Forced meds need to be addressed for depression and psychosis  4. He is medically fragile - his mother knows he may die in the pursuit of  decisions with or without treatment.  Will continue to follow.  I have discussed this case in detail with Dr. Zigmund Daniel, Ethics.  Lane Hacker, DO Palliative Medicine   Time: 50 minutes Greater than 50%  of this time was spent counseling and coordinating care related to the above assessment and plan.

## 2016-07-16 NOTE — Progress Notes (Signed)
Patient ID: Joshua Park, male   DOB: 07-28-1970, 46 y.o.   MRN: [id number]          [facility] for Infectious Disease    Date of Admission:  06/30/2016           Day 4 polymyxin and tigecycline  He has been refusing some oral medications but has received all doses of his IV antibiotics recently. I spoke with his nurse this morning and asked her to call me for his dressing change at 2:00. Apparently he has refused that. I will attempt to inspect his wound tomorrow.         Michel Bickers, MD [facility] for Infect[facility] [place] [facility] [phone] pager   [phone] cell 07/10/2015, 1:32 PM

## 2016-07-16 NOTE — Progress Notes (Signed)
Patient has been complaining of abdominal pain this shift.Patient receiving scheduled oxycodone for pain.Patient has not had bowel movement since 07/11/16 bowel sound hypoactive.This nurse has asked patient twice this shift if he wanted to get laxative to help him have bowel movement to see if help relieve abdominal pain.Patient verbalized twice no. Will continue to monitor.

## 2016-07-16 NOTE — Progress Notes (Signed)
Pt refused to go down for CT scan.
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
## 2016-07-16 NOTE — Progress Notes (Signed)
Pts mother Thayer Ohm asked if doctor would call and give update [phone], paged Dr. Carlis Stable.

## 2016-07-16 NOTE — Progress Notes (Signed)
Patient refused vital signs last night and this morning.

## 2016-07-17 LAB — BASIC METABOLIC PANEL
ANION GAP: 8 (ref 5–15)
BUN: <5 mg/dL — ABNORMAL LOW (ref 6–20)
CHLORIDE: 94 mmol/L — AB (ref 101–111)
CO2: 26 mmol/L (ref 22–32)
Calcium: 7.8 mg/dL — ABNORMAL LOW (ref 8.9–10.3)
Creatinine, Ser: <0.3 mg/dL — ABNORMAL LOW (ref 0.61–1.24)
Glucose, Bld: 95 mg/dL (ref 65–99)
POTASSIUM: 2.5 mmol/L — AB (ref 3.5–5.1)
Sodium: 128 mmol/L — ABNORMAL LOW (ref 135–145)

## 2016-07-17 LAB — MAGNESIUM: Magnesium: 1.2 mg/dL — ABNORMAL LOW (ref 1.7–2.4)

## 2016-07-17 MED ORDER — SODIUM CHLORIDE 0.9 % IV SOLN
30.0000 meq | Freq: Once | INTRAVENOUS | Status: AC
  Administered 2016-07-17: 30 meq via INTRAVENOUS
  Filled 2016-07-17: qty 15

## 2016-07-17 MED ORDER — FLUOXETINE HCL 20 MG PO CAPS
20.0000 mg | ORAL_CAPSULE | Freq: Two times a day (BID) | ORAL | Status: DC
  Administered 2016-07-17: 20 mg via ORAL
  Filled 2016-07-17: qty 1

## 2016-07-17 MED ORDER — SODIUM CHLORIDE 0.9 % IV SOLN
30.0000 meq | Freq: Once | INTRAVENOUS | Status: DC
  Filled 2016-07-17: qty 15

## 2016-07-17 MED ORDER — KCL IN DEXTROSE-NACL 20-5-0.9 MEQ/L-%-% IV SOLN
INTRAVENOUS | Status: DC
  Administered 2016-07-17 – 2016-07-18 (×2): via INTRAVENOUS
  Filled 2016-07-17 (×3): qty 1000

## 2016-07-17 MED ORDER — SODIUM CHLORIDE 0.9 % IV BOLUS (SEPSIS)
500.0000 mL | Freq: Once | INTRAVENOUS | Status: AC
  Administered 2016-07-17: 500 mL via INTRAVENOUS

## 2016-07-17 MED ORDER — MAGNESIUM SULFATE 2 GM/50ML IV SOLN
2.0000 g | Freq: Once | INTRAVENOUS | Status: AC
  Administered 2016-07-17: 2 g via INTRAVENOUS
  Filled 2016-07-17: qty 50

## 2016-07-17 NOTE — Progress Notes (Signed)
Patient ID: Joshua Park, male   DOB: Nov 09, 1970, 46 y.o.   MRN: [id number]          [facility] for Infectious Disease    Date of Admission:  06/30/2016           Day 5 polymyxin and tigecycline  I have reviewed notes from the Ethics and Palliative Care teams. [name] continues to refuse care that would be necessary to heal his complex pressure wounds and sacral osteomyelitis. Psychiatry is going to be reconsulted and a meeting will be arranged with his family. Additional susceptibility testing of his multidrug resistant Acinetobacter is still pending. I will continue his current antibiotic regimen for now.         Michel Bickers, MD [facility] for Infectious [place] [facility] [phone] pager   [phone] cell 07/10/2015, 1:32 PM

## 2016-07-17 NOTE — Progress Notes (Signed)
CRITICAL VALUE ALERT  Critical value received:  Potassium 2.5  Date of notification:  07/17/16  Time of notification:  0645  Critical value read back:yes  Nurse who received alert: Joaquim Lai, RN  MD notified (1st page):  On call Hospitalist  Time of first page:  [phone]

## 2016-07-17 NOTE — Progress Notes (Signed)
Patient refused foley catheter care. Refused repositioning. Trigger Frasier, [name], Therapist, sports
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
## 2016-07-17 NOTE — Care Management Note (Signed)
Case Management Note  Patient Details  Name: Joshua Park MRN: [id number] Date of Birth: 19-Feb-1971  Subjective/Objective:        CM continuing to follow for progression and d/c planning.             Action/Plan: 07/17/16 Ethics referral made on 07/16/16 by this CM , received immediate call back from Dr Stann Mainland re this pt. H&P and current status discussed with Dr Zigmund Daniel and Dr Zigmund Daniel reviewed record. This am 07/17/2016 this CM received a call from Dr Zigmund Daniel who has discussed this pt with the attending, Dr. Dillard Cannon, Dr Hilma Favors and the pt mother. She has ask that Dr Dwyane Dee , re eval this pt for psy issues, that Risk Management eval this case and advise re hospital rights to move forward with treatments that pt is currently receiving. She also wishes to meet with the pt family, attending, and primary providers . CSW V Sharlet Salina has contacted the pt mother who states that she is attempting to reach this pt adult children re their wishes to be involved in decision making for this pt . At this time the pt mother states that she and the pt sister plan to be her tomorrow, Friday, Jul 18, 2016 however she has not given [place] a time as yet. I will contact the pt mother again this afternoon, to learn the time of her visit on 07/18/16 and to assist with reaching the pt children if possible.  Name and number of pt attending at SNF given to Dr Erlinda Hong per her request.  Expected Discharge Date:                  Expected Discharge Plan:  Skilled Nursing Facility  In-House Referral:  Clinical Social Work  Discharge planning Services  CM Consult  Post Acute Care Choice:  NA Choice offered to:  NA  DME Arranged:    DME Agency:     HH Arranged:    [place] Agency:     Status of Service:  In process, will continue to follow  If discussed at Long Length of Stay Meetings, dates discussed:    Additional Comments:  Adron Bene, RN 07/17/2016, 12:00 PM

## 2016-07-17 NOTE — Clinical Social Work Note (Signed)
CSW talked with patient's mother, Joshua Park regarding patient and meeting with doctors to discharge treatment plan and discharge options for patient. Ms. [name] advised that it was desired for patient's children to be present if possible and names and contact information needed. Ms. [name] informed CSW that she and her daughter had been trying to reach his children (2 of the 3 children) regarding their dad and coming to the hospital as she was contacted by an MD on Wednesday.  CSW provided with names and phone numbers of patient's daughters: 1. [name] - age 19 (in college) [phone] 2. [name] - age 70; lives in [place]; [phone]. Ms. [name] indicated that [name] may not answer the phone, so just leave a message. She also reported that she raised [name]. 3. [name] - age 50 or 54. Lives in [place] with his grandmother and they have no contact with him.  Ms. [name] reported that she plans to come to the hospital on Friday and is hopeful that his daughters can also come.  Joshua Park, MSW, LCSW Licensed Clinical Social Worker [place] [phone]
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
## 2016-07-17 NOTE — Progress Notes (Signed)
Patient's BP 85/51 HR 93, manual BP 78/50. Patient asymptomatic.K Baltazar Najjar notified via text page. Order received and carried out.Will continue to monitor. Wayne Wicklund, [name], Therapist, sports

## 2016-07-18 LAB — COMPREHENSIVE METABOLIC PANEL
ALT: 10 U/L — ABNORMAL LOW (ref 17–63)
AST: 18 U/L (ref 15–41)
Albumin: 1.2 g/dL — ABNORMAL LOW (ref 3.5–5.0)
Alkaline Phosphatase: 71 U/L (ref 38–126)
Anion gap: 7 (ref 5–15)
BILIRUBIN TOTAL: 0.3 mg/dL (ref 0.3–1.2)
CO2: 25 mmol/L (ref 22–32)
CREATININE: 0.43 mg/dL — AB (ref 0.61–1.24)
Calcium: 7.7 mg/dL — ABNORMAL LOW (ref 8.9–10.3)
Chloride: 93 mmol/L — ABNORMAL LOW (ref 101–111)
GFR calc Af Amer: >60 mL/min (ref >60)
Glucose, Bld: 186 mg/dL — ABNORMAL HIGH (ref 65–99)
POTASSIUM: 2.6 mmol/L — AB (ref 3.5–5.1)
Sodium: 125 mmol/L — ABNORMAL LOW (ref 135–145)
TOTAL PROTEIN: 5.3 g/dL — AB (ref 6.5–8.1)

## 2016-07-18 LAB — CBC WITH DIFFERENTIAL/PLATELET
BASOS ABS: 0 10*3/uL (ref 0.0–0.1)
Basophils Relative: 0 %
EOS ABS: 0 10*3/uL (ref 0.0–0.7)
EOS PCT: 0 %
HCT: 20.4 % — ABNORMAL LOW (ref 39.0–52.0)
Hemoglobin: 6.7 g/dL — CL (ref 13.0–17.0)
LYMPHS PCT: 9 %
Lymphs Abs: 1.1 10*3/uL (ref 0.7–4.0)
MCH: 26.4 pg (ref 26.0–34.0)
MCHC: 32.8 g/dL (ref 30.0–36.0)
MCV: 80.3 fL (ref 78.0–100.0)
Monocytes Absolute: 0.7 10*3/uL (ref 0.1–1.0)
Monocytes Relative: 6 %
Neutro Abs: 10.2 10*3/uL — ABNORMAL HIGH (ref 1.7–7.7)
Neutrophils Relative %: 85 %
Platelets: 508 10*3/uL — ABNORMAL HIGH (ref 150–400)
RBC: 2.54 MIL/uL — AB (ref 4.22–5.81)
RDW: 16.8 % — ABNORMAL HIGH (ref 11.5–15.5)
WBC: 12 10*3/uL — AB (ref 4.0–10.5)

## 2016-07-18 LAB — MAGNESIUM: MAGNESIUM: 1 mg/dL — AB (ref 1.7–2.4)

## 2016-07-18 LAB — HEMOGLOBIN AND HEMATOCRIT, BLOOD
HCT: 27.5 % — ABNORMAL LOW (ref 39.0–52.0)
Hemoglobin: 9.2 g/dL — ABNORMAL LOW (ref 13.0–17.0)

## 2016-07-18 LAB — PREPARE RBC (CROSSMATCH)

## 2016-07-18 MED ORDER — MAGNESIUM SULFATE 4 GM/100ML IV SOLN
4.0000 g | Freq: Once | INTRAVENOUS | Status: AC
  Administered 2016-07-18: 4 g via INTRAVENOUS
  Filled 2016-07-18: qty 100

## 2016-07-18 MED ORDER — HALOPERIDOL LACTATE 5 MG/ML IJ SOLN: 0.5000 mg | INTRAMUSCULAR | Status: DC | PRN

## 2016-07-18 MED ORDER — HALOPERIDOL 0.5 MG PO TABS
0.5000 mg | ORAL_TABLET | ORAL | Status: DC | PRN
  Filled 2016-07-18: qty 1

## 2016-07-18 MED ORDER — POTASSIUM CHLORIDE 2 MEQ/ML IV SOLN
30.0000 meq | INTRAVENOUS | Status: DC
  Administered 2016-07-18: 30 meq via INTRAVENOUS
  Filled 2016-07-18 (×3): qty 15

## 2016-07-18 MED ORDER — TIGECYCLINE 50 MG IV SOLR
50.0000 mg | Freq: Two times a day (BID) | INTRAVENOUS | Status: DC
  Administered 2016-07-18: 50 mg via INTRAVENOUS
  Filled 2016-07-18 (×2): qty 100

## 2016-07-18 MED ORDER — SODIUM CHLORIDE 0.9 % IV SOLN
0.5000 mg/h | INTRAVENOUS | Status: DC
  Administered 2016-07-18 – 2016-07-24 (×5): 0.5 mg/h via INTRAVENOUS
  Filled 2016-07-18 (×5): qty 5

## 2016-07-18 MED ORDER — BOOST / RESOURCE BREEZE PO LIQD: 1.0000 | ORAL | Status: DC | PRN

## 2016-07-18 MED ORDER — SODIUM CHLORIDE 0.9 % IV BOLUS (SEPSIS)
1000.0000 mL | Freq: Once | INTRAVENOUS | Status: AC
  Administered 2016-07-18: 1000 mL via INTRAVENOUS

## 2016-07-18 MED ORDER — DIPHENHYDRAMINE HCL 50 MG/ML IJ SOLN: 12.5000 mg | INTRAMUSCULAR | Status: DC | PRN

## 2016-07-18 MED ORDER — SODIUM CHLORIDE 0.9 % IV SOLN
Freq: Once | INTRAVENOUS | Status: AC
  Administered 2016-07-18: 12:00:00 via INTRAVENOUS

## 2016-07-18 MED ORDER — LORAZEPAM 2 MG/ML IJ SOLN
1.0000 mg | INTRAMUSCULAR | Status: DC | PRN
  Administered 2016-07-20: 1 mg via INTRAVENOUS
  Filled 2016-07-18 (×2): qty 1

## 2016-07-18 MED ORDER — GLYCOPYRROLATE 0.2 MG/ML IJ SOLN
0.2000 mg | INTRAMUSCULAR | Status: DC | PRN
  Filled 2016-07-18: qty 1

## 2016-07-18 MED ORDER — ONDANSETRON 4 MG PO TBDP: 4.0000 mg | ORAL_TABLET | Freq: Four times a day (QID) | ORAL | Status: DC | PRN

## 2016-07-18 MED ORDER — SODIUM CHLORIDE 0.9 % IV SOLN
INTRAVENOUS | Status: DC
  Administered 2016-07-18: 20:00:00 via INTRAVENOUS
  Administered 2016-07-23: 20 mL via INTRAVENOUS

## 2016-07-18 MED ORDER — GLYCOPYRROLATE 1 MG PO TABS
1.0000 mg | ORAL_TABLET | ORAL | Status: DC | PRN
  Filled 2016-07-18: qty 1

## 2016-07-18 MED ORDER — POLYVINYL ALCOHOL 1.4 % OP SOLN: 1.0000 [drp] | Freq: Four times a day (QID) | OPHTHALMIC | Status: DC | PRN

## 2016-07-18 MED ORDER — BIOTENE DRY MOUTH MT LIQD: 15.0000 mL | OROMUCOSAL | Status: DC | PRN

## 2016-07-18 MED ORDER — HYDROMORPHONE BOLUS VIA INFUSION
1.0000 mg | INTRAVENOUS | Status: DC | PRN
  Administered 2016-07-19 – 2016-07-26 (×9): 1 mg via INTRAVENOUS
  Filled 2016-07-18: qty 1

## 2016-07-18 MED ORDER — SODIUM CHLORIDE 0.9 % IV SOLN
30.0000 meq | INTRAVENOUS | Status: DC
  Administered 2016-07-18: 30 meq via INTRAVENOUS
  Filled 2016-07-18 (×2): qty 15

## 2016-07-18 MED ORDER — ONDANSETRON HCL 4 MG/2ML IJ SOLN
4.0000 mg | Freq: Four times a day (QID) | INTRAMUSCULAR | Status: DC | PRN
  Administered 2016-07-18: 4 mg via INTRAVENOUS
  Filled 2016-07-18: qty 2

## 2016-07-18 MED ORDER — MIDAZOLAM HCL 2 MG/2ML IJ SOLN: 2.0000 mg | INTRAMUSCULAR | Status: DC | PRN

## 2016-07-18 MED ORDER — HALOPERIDOL LACTATE 2 MG/ML PO CONC
0.5000 mg | ORAL | Status: DC | PRN
  Filled 2016-07-18: qty 0.3

## 2016-07-18 NOTE — Progress Notes (Signed)
Patient refused a Bath from the NT and then from the RN.

## 2016-07-18 NOTE — Progress Notes (Signed)
CRITICAL VALUE ALERT  Critical value received: K+: 2.6 and Hgb:6.7  Date of notification: 07/18/16  Time of notification: 0840  Critical value read back: Yes  Nurse who received alert: Cardell Peach   MD notified (1st page): Erlinda Hong  Time of first page: [phone]  Responding MD: Erlinda Hong  Time MD responded: [phone]
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
## 2016-07-18 NOTE — Progress Notes (Signed)
Palliative Care Family Meeting Req: [facility]  I met with patient's mother, available siblings and two of his daughters along with a niece as well as Dr. Matthews from ethics, Dr. Xu hospitalist, Tamara Caple, Nursing Director.   Discussion was had about his current condition, how this condition evolved over time to get to this point, the medical options being offered as well as comfort care option.   Based on the information shared by the family about [place] and what they thought he would want given his current situation, I was able to support them in a decision for comfort care as not only a courageous decision given the circumstances but a very reasonable and appropriate medical recommendation based on what limited treatment options we actually had to offer.His condition has deteriorated over the past two years with malnutrition, severe depression and self neglect that he would in my medical opinion never recover to a baseline where he could be in a place of stable health- I was honest with the family about my medical opinion on this.  I validated their care and concern for him now and over the years. Palliative will continue to support them.  Recs:  1. Full comfort care 2. Discontinue non-essential meds and those not related to comfort 3. Administer opiate and benzo combination 4. Will use opioid infusion based on his current dosing and use versed for dressing changes and baths.  I will ask that the care team give him sedation and work on getting him clean and groomed-this is the families one wish for him- to restore some of his dignity.  Appreciate the entire team for assisting in this discussion today.   , DO Palliative Medicine [phone]  Time: 70 minutes Greater than 50%  of this time was spent counseling and coordinating care related to the above assessment and plan.     

## 2016-07-18 NOTE — Clinical Social Work Note (Signed)
CSW reviewed Palliative note and will continue to follow for d/c disposition (back to SNF versus other d/c option). Weekend handoff completed.  Avyay Coger Givens, MSW, LCSW Licensed Clinical Social Worker [place] [phone]

## 2016-07-18 NOTE — Progress Notes (Signed)
Pt. Refused assessment and dressing change today. Also, patient has been refusing PO medications. Education given. Will continue to monitor.

## 2016-07-18 NOTE — Progress Notes (Signed)
Patient ID: Joshua Park, male   DOB: [birth year], 46 y.o.   MRN: [id number]         [facility]    Date of Admission:  06/30/2016           Day 6 polymyxin and tigecycline  He continues to refuse wound care and oral medications other than his pain medication. He is not eating well and remains malnourished. Additional susceptibility testing of his multidrug resistant Acinetobacter is still pending. I will continue his current antibiotic regimen for now pending those results and discussions with his family about goals of care. Please call Dr. Tommy Medal [phone]) for any infectious disease questions this weekend.         Michel Bickers, MD [facility] for [facility] [phone] pager   [phone] cell
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
## 2016-07-19 LAB — TYPE AND SCREEN
ABO/RH(D): A POS
ANTIBODY SCREEN: NEGATIVE
Unit division: 0

## 2016-07-19 NOTE — Clinical Social Work Note (Signed)
CSW reviewed pt's chart and will continue to follow for d/c disposition (back to SNF versus other d/c option). Per MD note awaiting Pall Care's input.  Elida Harbin B. Joline Maxcy Clinical Social Work Dept Weekend Social Worker [phone] 3:19 PM

## 2016-07-19 NOTE — Progress Notes (Signed)
On full comfort measures, appreciate palliative care team input.  Will follow palliative care team recommendation. Return to SNF with comfort measures vs residential hospice.

## 2016-07-19 NOTE — Progress Notes (Signed)
Responded to consult after reviewing excellent ethics and palliative team reports. Met w/ pt and his Joshua Park in rm, providing spiritual/emotional support and prayer. Pt assented when Joshua Park asked did he want prayer, she said they were [employer]/Christian, and she thanked me for the prayer (in which she had joined in responsively at times).   Joshua Park was ensuring he was drinking water both as I came in and when I left. Fyi she seemed surprised when I put on the gown and said she didn't know anything about that. Chaplain available for f/u.   07/19/16 1600  Clinical Encounter Type  Visited With Patient and family together  Visit Type Initial;Psychological support;Spiritual support;Social support  Referral From Physician  Spiritual Encounters  Spiritual Needs Prayer;Emotional  Stress Factors  Patient Stress Factors Health changes;Loss of control;Other (Comment) (on full comfort care)  Family Stress Factors Family relationships;Health changes;Loss of control   Gerrit Heck, Chaplain

## 2016-07-20 MED ORDER — BISACODYL 10 MG RE SUPP
10.0000 mg | Freq: Every day | RECTAL | Status: DC
  Administered 2016-07-20 – 2016-07-23 (×3): 10 mg via RECTAL
  Filled 2016-07-20 (×4): qty 1

## 2016-07-20 MED ORDER — POLYETHYLENE GLYCOL 3350 17 G PO PACK
17.0000 g | PACK | Freq: Every day | ORAL | Status: DC
  Administered 2016-07-20 – 2016-07-23 (×3): 17 g via ORAL
  Filled 2016-07-20 (×3): qty 1

## 2016-07-20 MED ORDER — SENNOSIDES-DOCUSATE SODIUM 8.6-50 MG PO TABS
2.0000 | ORAL_TABLET | Freq: Two times a day (BID) | ORAL | Status: DC
  Administered 2016-07-20 – 2016-07-23 (×5): 2 via ORAL
  Filled 2016-07-20 (×8): qty 2

## 2016-07-20 MED ORDER — MAGNESIUM HYDROXIDE 400 MG/5ML PO SUSP
960.0000 mL | Freq: Once | ORAL | Status: DC
  Filled 2016-07-20: qty 240

## 2016-07-20 NOTE — Progress Notes (Signed)
Spoke with Dr. Erlinda Hong and patient's sister.  Sister agreed to Ativan & Dilaudid prior to dressing change.  Patient received 1 mg IV Ativan and 1 mg bolus of Dilaudid per MD order.  Wounds cleaned and redressed.   Patient given an additional 1 mg bolus of Dilaudid prior to bathing.  Patient tolerated well. Patient's sister and cousin thanked both Therapist, sports and [place] for the care given.

## 2016-07-20 NOTE — Ethics Note (Signed)
Patient ID: BRALYNN BHATTI, male   DOB: 1971-01-06, 46 y.o.   MRN: [id number]  This is a follow up on meeting from Friday 07/18/2016. Pt's medications have been adjusted for comfort only. The discussion surrounding his care included sedating and medicating patient to provide local care to wounds and to provide appropriate personal care. The staff however is still reluctant to provide care against patients wishes. I have explained both to bedside nurse Wells Guiles) and to charge nurse Rodena Piety) that patient lacks capacity to accept or reject medical care and that his surrogate decision makers have agreed to the above measures. This would constitute forced care as patient is still refusing. However forced care is allowed in order to manage the wounds and his personal hygiene in the context of providing comfort and dignity. Pt to be sedated and given pain medications as prescribed by Dr. Armandina Gemma to achieve these goals.   MATTHEWS,MICHELLE A.

## 2016-07-20 NOTE — Progress Notes (Signed)
Continue full comfort measures, family updated.

## 2016-07-20 NOTE — Progress Notes (Signed)
Patient refusing dressing changes at this time.

## 2016-07-20 NOTE — Progress Notes (Signed)
Patient refusing enema but would agree to suppository per sister.  Dr. Erlinda Hong notified.  Per Dr. Erlinda Hong she is OK with that.

## 2016-07-21 LAB — AEROBIC CULTURE  (SUPERFICIAL SPECIMEN)

## 2016-07-21 LAB — AEROBIC CULTURE W GRAM STAIN (SUPERFICIAL SPECIMEN)

## 2016-07-21 MED ORDER — LORAZEPAM 2 MG/ML IJ SOLN: 0.7500 mg | INTRAMUSCULAR | Status: DC | PRN

## 2016-07-21 MED ORDER — WHITE PETROLATUM GEL
Status: AC
  Administered 2016-07-21: 15:00:00
  Filled 2016-07-21: qty 1

## 2016-07-21 NOTE — Progress Notes (Signed)
Palliative Care Progress Note Reason for Follow-up: Symptom management, Terminal Care  Patient's sister is at bedside. He is weak but responsive. Still not eating. Complains of abdominal pain. Says the "bolus helps" but when I offer it he refuses. Goals are comfort. He is still refusing to eat.  Currently on Dilaudid at 0.5mg /hr. He is asking why his oxycodone was stopped. I will resume based on his need for bolus dosing and his request. When he stops taking PO the drip can be adjusted. I explained to him that we are no longer going to force medication on him that we are focusing on the relief of his pain.  Nursing staff are not comfortable with administration of versed on this unit even though it is not being used in the context of conscious sedation, versed had been ordered as a benzo for its short half life so the order was changed to Ativan which caused him to be sedated for much longer yuesterday and his family were worried about this. I explained to nursing and his family member at bedside the choices for drugs that would cause sedation for his comfort during dressing changes and bathing.Discussed dressing changes with nursing-given comfort goals wioll change to QOD and PRN if soiled.  Constipation continues to be an issue-he is getting relistor and a variety of other oral laxatives-he refuses enemas and suppositories. He remains extremely constipated.   I also discussed hospice care and hospice facility option. He will need IV comfort medication so SNF is not an option. He also has very complicated symptom management needs. They are going to discuss as a family. I will check in tomorrow on this and provide additional information if needed.  Reduced dose of Ativan to .75 per family request.  Will follow closely.  Lane Hacker, DO Palliative Medicine  Time: 50 minutes Greater than 50%  of this time was spent counseling and coordinating care related to the above assessment and  plan.
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
## 2016-07-21 NOTE — Progress Notes (Signed)
Nutrition Brief Note  Chart reviewed. Pt is full comfort measures.  No further nutrition interventions warranted at this time.  Please re-consult as needed.   Corrin Parker, MS, RD, LDN Pager # [phone] After hours/ weekend pager # [phone]

## 2016-07-22 LAB — MISC LABCORP TEST (SEND OUT): LABCORP TEST CODE: 88005

## 2016-07-22 NOTE — Progress Notes (Signed)
New bag diluadid drip hung per orders.Wasted 75 ml from previous bag that was expired.Witnessed by Rayburn Go RN.

## 2016-07-22 NOTE — Progress Notes (Signed)
Palliative Care Follow-up  Joshua Park is weaker today, more sedated. Small BM noted. Still no PO intake.  I spoke with his mother Joshua Park-primary decision maker about hospice facility level care- I think he and the family would benefit greatly from this level of support. She is going to discuss with his children then call me back- she believes this will be the direction they will go but wants to include his children in on the decision making. I also encouraged a visit to [facility] and if it would be helpful I could have the liaison come and speak with them. I have been upfront about his prognosis-it will be a slow decline most likely 2-4 weeks possibly much less if signs of sepsis are seen.  Will update care team once I hear back from Ms. Park.  Joshua Hacker, DO Palliative Medicine  Time: 25 minutes Greater than 50%  of this time was spent counseling and coordinating care related to the above assessment and plan.

## 2016-07-22 NOTE — Clinical Social Work Note (Addendum)
CSW continuing to follow for discharge disposition. Per MD, awaiting family decision on possible residential hospice placement vs return to SNF with comfort measures.  Palliative care following. CSW will assist as needed once decision made.   Neaveh Belanger Givens, MSW, LCSW Licensed Clinical Social Worker [place] [phone]

## 2016-07-22 NOTE — Progress Notes (Signed)
Patient complains of burning pain in belly.Patient continues on diluadid drip as ordered .This nurse has given patient 1 mg boluses IV dilaludid twice since shift began and has offered patient more pain medication but patient refusing at present time.Informed patient bolus doses of dilaulid could be given every thirty minutes if needed.Patient verbalized understanding .Patient refusing scheduled nighttime medications  at present time.Will continue to monitor pain and asked patient to call when medication needed.

## 2016-07-22 NOTE — Progress Notes (Signed)
Patients  Mother called and wanted update, paged Dr. Erlinda Hong to advise pts mother would like for her to call to discuss placement [phone]
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
## 2016-07-23 MED ORDER — BISACODYL 10 MG RE SUPP: 10.0000 mg | Freq: Every day | RECTAL | 0 refills | Status: DC

## 2016-07-23 MED ORDER — SODIUM CHLORIDE 0.9 % IV SOLN: 0.5000 mg/h | INTRAVENOUS | Status: DC

## 2016-07-23 MED ORDER — HALOPERIDOL LACTATE 2 MG/ML PO CONC: 0.5000 mg | ORAL | 0 refills | Status: DC | PRN

## 2016-07-23 MED ORDER — HALOPERIDOL 0.5 MG PO TABS
0.5000 mg | ORAL_TABLET | ORAL | Status: DC | PRN
Start: 2016-07-23 — End: 2020-01-17

## 2016-07-23 MED ORDER — MIDAZOLAM HCL 2 MG/2ML IJ SOLN: 2.0000 mg | INTRAMUSCULAR | 0 refills | Status: DC | PRN

## 2016-07-23 MED ORDER — METHYLNALTREXONE BROMIDE 12 MG/0.6ML ~~LOC~~ SOLN: 12.0000 mg | SUBCUTANEOUS | Status: DC

## 2016-07-23 MED ORDER — LORAZEPAM 2 MG/ML IJ SOLN: 0.7500 mg | INTRAMUSCULAR | 0 refills | Status: DC | PRN

## 2016-07-23 MED ORDER — HYDROMORPHONE BOLUS VIA INFUSION: 1.0000 mg | INTRAVENOUS | 0 refills | Status: DC | PRN

## 2016-07-23 NOTE — Progress Notes (Signed)
Patient refusing assessment at this time.

## 2016-07-23 NOTE — Progress Notes (Signed)
Wasted 30 ml of dilaudid drip witnessed by Ranelle Oyster.
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
## 2016-07-23 NOTE — Consult Note (Signed)
[facility] Liaison  Received request from [place] for family interest in [place]. Unfortunatley no [employer] availability today. Chart reviewed and will continue to follow/update CSW re availability.   Thank you,  Erling Conte, LCSW [phone]

## 2016-07-24 NOTE — Progress Notes (Signed)
Dilaudid: 60.4 ml wasted with Maryana, Rn.

## 2016-07-24 NOTE — Progress Notes (Signed)
Vitals reviewed. Patient's discharge paperwork completed from my standpoint for patient to transition to residential hospice.  Joshua Park, [employer]

## 2016-07-24 NOTE — Progress Notes (Addendum)
[facility] may be able to take pt today but patient's mother stated she was not aware that patient was terminal. CSW trying to get in touch with palliative care to call the mother. CSW faxed clinicals to [facility].  Percell Locus Rozann Holts LCSWA [phone]

## 2016-07-24 NOTE — Progress Notes (Signed)
[facility] do not have beds. CSW awaiting call back from [place] and [place].  Percell Locus Masami Plata LCSWA [phone]

## 2016-07-25 NOTE — Consult Note (Addendum)
[facility] [place] ([place] [facility] Liaison  Continue to follow for family interest in [facility]. Chart reviewed and received report from Dr. Hilma Favors. Met with sister Lattie Haw at bedside. Patient raised his hand to wave back at me when I entered room. He did not engage in conversation. Confirmed interest with Lattie Haw. Explained to her [facility] is not able to offer a room at this time and an [facility] representative will update family/CSW if availability changes over weekend. Understand from Lattie Haw going out of the immediate area is a hardship for the family. Note family lives close to [facility]. Updated CSW Lorriane Shire after this visit.   Please do not hesitate to contact [facility] with questions.  [facility] liaison are listed on AMION under [facility]osp[facility] and [place] or call [phone].  Thank you,  Erling Conte, LCSW [phone]

## 2016-07-25 NOTE — Clinical Social Work Note (Addendum)
CSW initially talked with patient's mother and sister by phone regarding residential hospice, [facility] not having a bed and informed them that [facility] has a bed and can take patient today. CSW informed that they don't want [facility] due to problems getting to facility. CSW explained that patient now comfort care and no longer needs acute hospital and needs to be discharged, however sister remained adamant that they only want [facility].   Talked later today with patient's sister, Joshua Park at the bedside regarding hospice facility. [name] indicated that it would be very difficult for her mother and family to get to [facility], due to car problems and it is more convenient and better for the their mother for patient to be at [facility]. [name] indicated that they don't want patient at [facility] and only want [facility]. Contacted Eva with [facility] (via text) regarding this and having weekend CSW f/u with them and she indicated no availability. CSW will continue to follow.   [name], MSW, LCSW Licensed Clinical Social Worker [place] [phone]

## 2016-07-25 NOTE — Progress Notes (Signed)
Discharge planning continues to ensue.   D/C orders and d/c summary completed.  Aylene Acoff, [employer]

## 2016-07-25 NOTE — Progress Notes (Addendum)
Joshua Park continues to decline. He tells me today he "knows how sick he is". Very weak. Pain still requiring bolus doses of hydromorphone. I spoke with his sister Lattie Haw who was at bedside. His mom understands that he is dying but was not sure about what "terminal" meant and wanted a call from a doctor about his condition and recommendations. They are in agreement about Hospice facility but it was be a serious strain on them to be placed outside of [place]. His mother has health issues and lives only a block or two away from [facility]. I think waiting on a [facility] bed is the right thing to do for this patient and his family given the circumstances. He needs continued IV pain and anxiety medication and skilled wound care. He is slowly dying, continue to take in little or no PO- small sips of flavored drinks and fluids is main intake. Prognosis around two weeks.  Time: 25 min Greater than 50%  of this time was spent counseling and coordinating care related to the above assessment and plan.   Lane Hacker, DO Palliative Medicine [phone]

## 2016-07-25 NOTE — Progress Notes (Signed)
Pt refusing medications today.  Drinking some liquids.  Pt asked to have dinner tray removed from bedside table.

## 2016-07-26 NOTE — Progress Notes (Signed)
Patient leaving in stable condition via EMS stretcher and is being transported to [facility].  Report called to Amanda Pea.

## 2016-07-26 NOTE — Progress Notes (Signed)
[facility] Liaison RN visit  Received request from [place] for family interest in [facility] with request for transfer today.  Chart reviewed.  Met with family to confirm interest and explain services.  Family agreeable to transfer today.  [place], [place], aware.  Registration paperwork completed at 1115am today.  Dr. Maree Krabbe to assume care per family request.  Please fax discharge summary to [phone].  RN please call report to [phone].  Please arrange transport to arrive as soon as possible.    Thank you,  Edyth Gunnels RN, [facility] Liaison [phone]  All hospital liaison's on are now [place].   Please call me directly at the number above or call hospice at [phone] after hours.

## 2016-07-26 NOTE — Progress Notes (Signed)
Patient refused morning medications.

## 2016-07-26 NOTE — Clinical Social Work Placement (Signed)
   CLINICAL SOCIAL WORK PLACEMENT  NOTE  Date:  07/26/2016  Patient Details  Name: Joshua Park MRN: [id number] Date of Birth: 05-Nov-1970  Clinical Social Work is seeking post-discharge placement for this patient at the [facility] level of care (*CSW will initial, date and re-position this form in  chart as items are completed):  Yes   Patient/family provided with [place] Work Department's list of facilities offering this level of care within the geographic area requested by the patient (or if unable, by the patient's family).  Yes   Patient/family informed of their freedom to choose among providers that offer the needed level of care, that participate in Medicare, Medicaid or managed care program needed by the patient, have an available bed and are willing to accept the patient.  Yes   Patient/family informed of [facility]'s ownership interest in [facility] and [facility], as well as of the fact that they are under no obligation to receive care at these facilities.  PASRR submitted to EDS on       PASRR number received on       Existing PASRR number confirmed on       FL2 transmitted to all facilities in geographic area requested by pt/family on       FL2 transmitted to all facilities within larger geographic area on       Patient informed that his/her managed care company has contracts with or will negotiate with certain facilities, including the following:            Patient/family informed of bed offers received.  Patient chooses bed at  [facility])     Physician recommends and patient chooses bed at      Patient to be transferred to  [facility]) on 07/26/16.  Patient to be transferred to facility by  [facility])     Patient family notified on 07/26/16 of transfer.  Name of family member notified:  Sister Lattie Haw     PHYSICIAN       Additional Comment:    _______________________________________________ Serafina Mitchell,  [place] 07/26/2016, 12:26 PM

## 2016-07-26 NOTE — Clinical Social Work Note (Signed)
FU with Amy @ [facility]. Amy reports messages left for both pt's mom and sister. Bed available for pt today, need family to complete paper work.  CSW called pt's mother Joshua Park and updated about bed available for pt today. Mom reports it's hard for her to get around and to please call her dtr Joshua Park [phone] to sign paperwork. Mom gave CSW phone number [phone] to reach [place]. Information passed to Amy @ [place].  CSW received confirmation from Amy @ [facility], consents signed, ok to send pt.  Clinical Social Worker facilitated patient discharge including contacting patient family Joshua Park and facility to confirm patient discharge plans. Clinical information faxed to facility and family agreeable with plan. CSW arranged ambulance transport via [facility] to. RN to call report prior to discharge.  Clinical Social Worker will sign off for now as social work intervention is no longer needed. Please consult [place] again if new need arises.  Joshua Park Clinical Social Work Dept Weekend Social Worker [phone] 12:09 PM

## 2016-07-26 NOTE — Progress Notes (Signed)
[facility] Liaison  I left my number on VM for CSW to call me back regarding patient getting bed at [facility].  I tried to call family and could not get them either.  Patient can go to [facility] this morning, if I can get paperwork signed with family.    Please give me a call at [phone].  Thanks, Edyth Gunnels, RN, BSN  All hospital liaisons are now on [place].   Please feel free to call me at the above number or call hospice at [phone] after 5pm.

## 2016-07-26 NOTE — Progress Notes (Signed)
Dilaudid 60.14ml wasted in sink per pharmacy with Manya Silvas, Rn.

## 2016-07-27 NOTE — Progress Notes (Signed)
Patient's mother called asking questions about the patient that discharged.  Provided telephone number to [facility] and suggested that she call [facility].  Jillyn Ledger, MBA, BSN, RN

## 2016-07-31 NOTE — Progress Notes (Addendum)
07/31/16 0850am Returned call to Lattie Haw, patient's sister following a voice mail. Lattie Haw called to verbalize a care concern for the patient during his stay on [facility] and to verbalize dissatisfaction with "not being told by the doctors in the hospital that he only had 2 weeks to live". Lattie Haw stated that she was informed of this information by the Hospice Liaison when she went to sign papers for admittance to [facility]. Informed Lattie Haw that I would contact Dr. Hilma Favors who had cared for patient while inpatient. Lattie Haw requested a call to her mother Farrel Conners regarding this concern.   Called and spoke with Dr. Hilma Favors, made aware of the above. Dr. Hilma Favors to attempt to contact patients mother.   Shamar Kracke, [place]

## 2016-12-18 IMAGING — MR MR FOOT*L* WO/W CM
6 of 9 series · 27 of 40 positions shown · IV contrast (multihance)
Comparison: Radiographs 06/28/2015.

CLINICAL DATA: Lateral forefoot ulcer. Quadriplegia. Evaluate for
osteomyelitis.

EXAM:
MRI OF THE LEFT FOREFOOT WITHOUT AND WITH CONTRAST
TECHNIQUE: Multiplanar, multisequence MR imaging was performed both before and
after administration of intravenous contrast.
CONTRAST:  12mL MULTIHANCE GADOBENATE DIMEGLUMINE 529 MG/ML IV SOLN

[Series 6: T1 · axial · 4.0mm · 0.29mm/px · z∈[-36,+93]mm · 5 of 30 slices shown (1 of 2)]
[im 1/30]
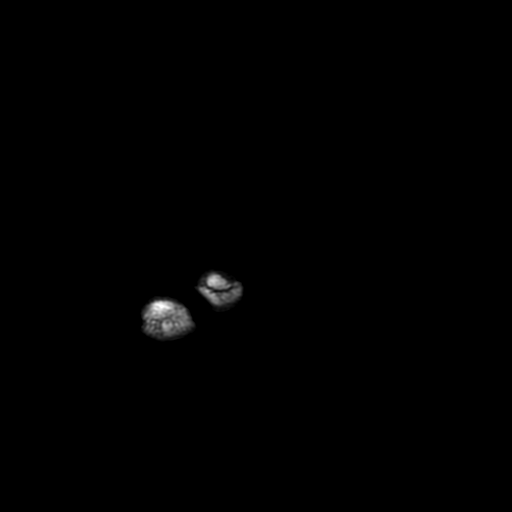
[im 8/30]
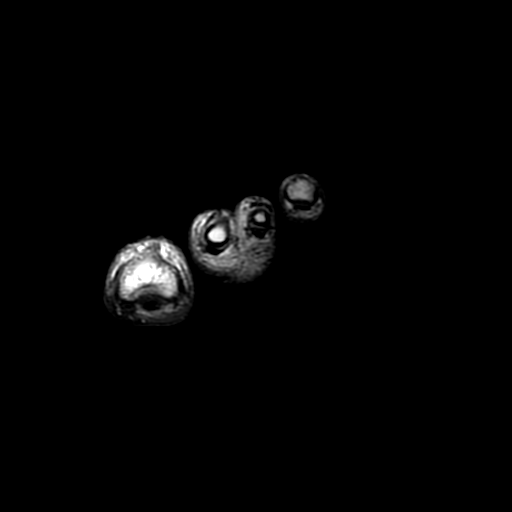
[im 15/30]
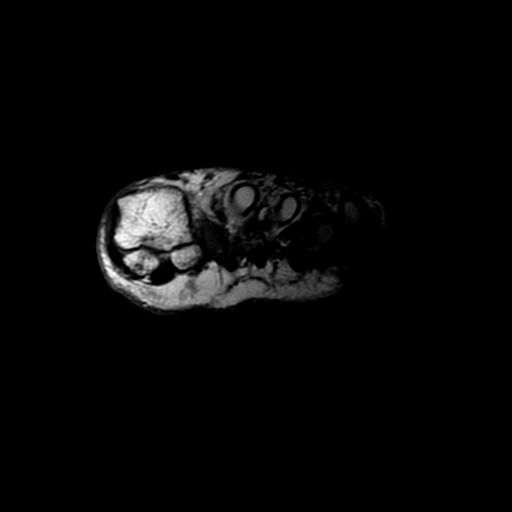
[im 22/30]
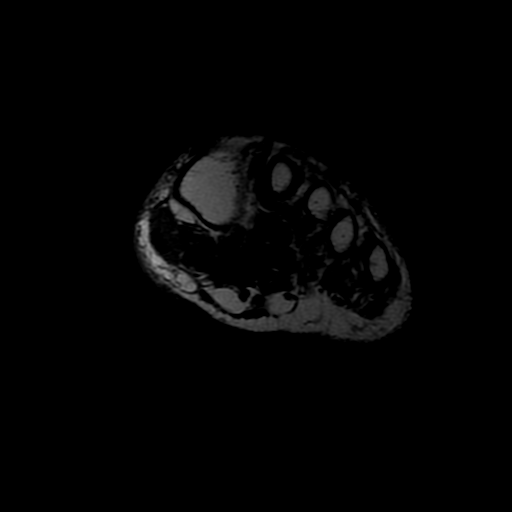
[im 30/30]
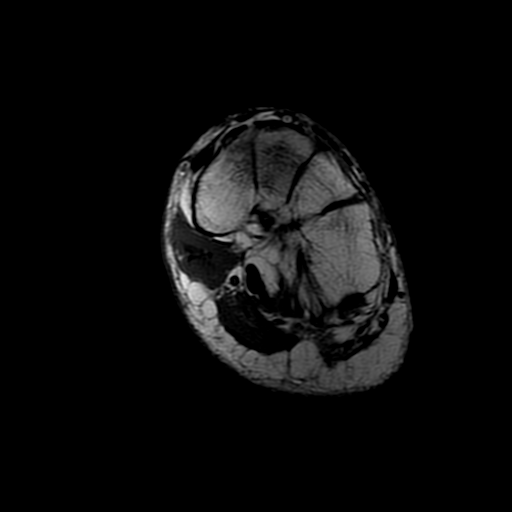

[Series 10: T1 · coronal · 3.0mm · 0.29mm/px · 3 of 17 slices shown (2 of 2)]
[im 1/17]
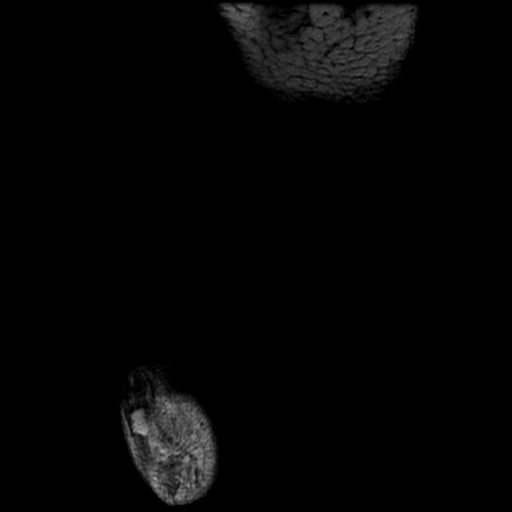
[im 9/17]
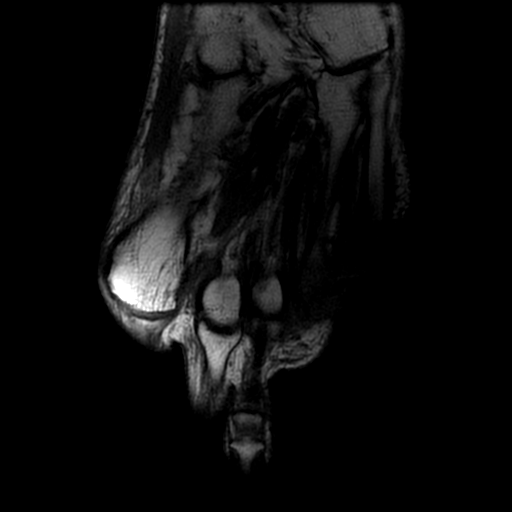
[im 17/17]
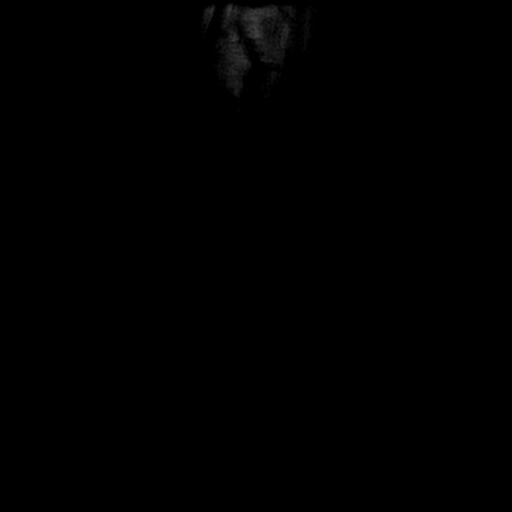

[Series 11: T1 fat-sat · axial · non-contrast · 4.0mm · 0.59mm/px · z∈[-36,+93]mm · 6 of 30 slices shown]
[im 1/30]
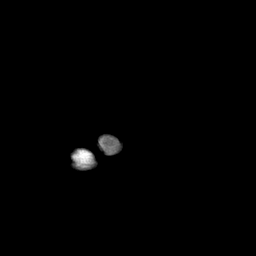
[im 6/30]
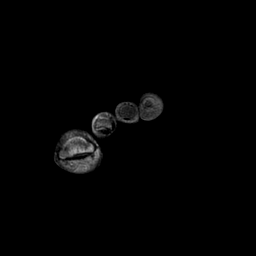
[im 12/30]
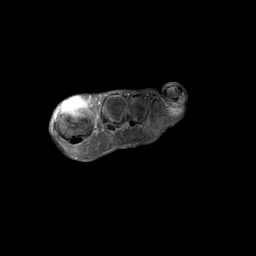
[im 18/30]
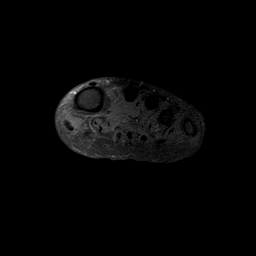
[im 24/30]
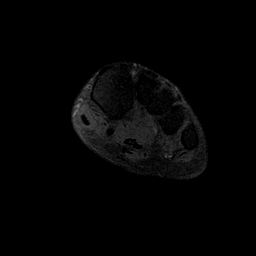
[im 30/30]
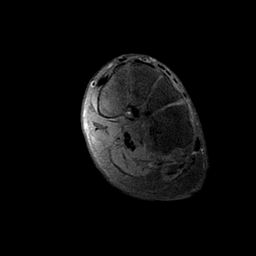

[Series 12: T1 post-contrast · axial · 4.0mm · 0.59mm/px · z∈[-36,+93]mm · 6 of 30 slices shown (1 of 3)]
[im 1/30]
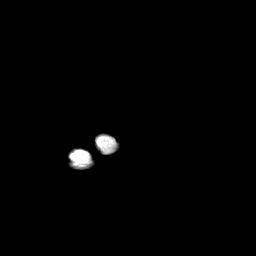
[im 6/30]
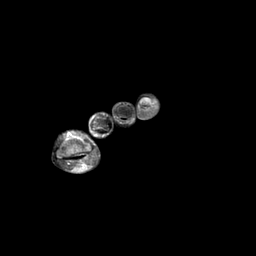
[im 12/30]
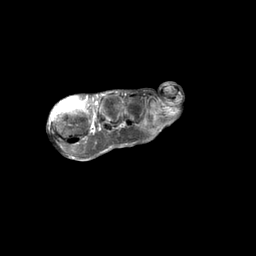
[im 18/30]
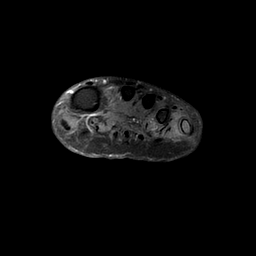
[im 24/30]
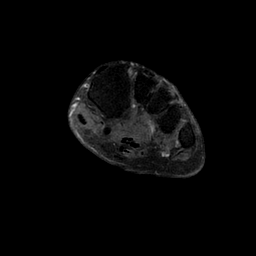
[im 30/30]
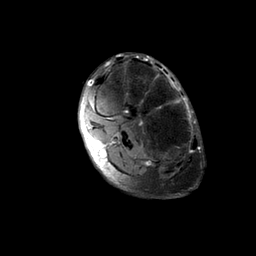

[Series 13: T1 post-contrast · coronal · 3.0mm · 0.29mm/px · 3 of 17 slices shown (2 of 3)]
[im 1/17]
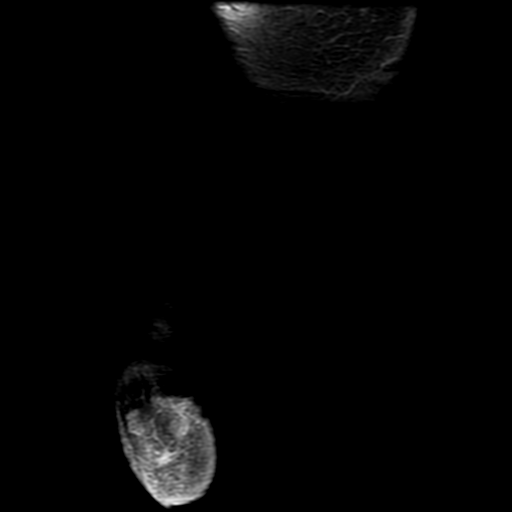
[im 9/17]
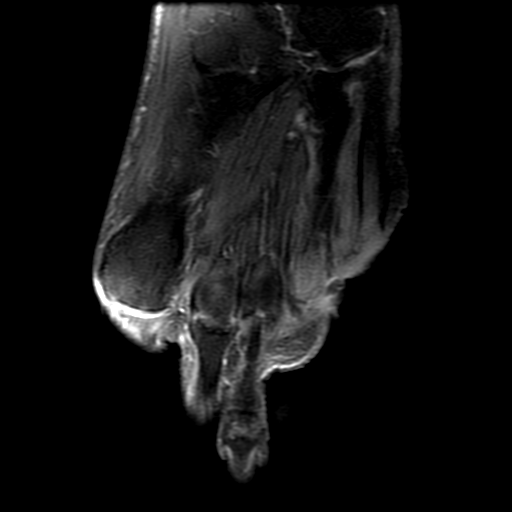
[im 17/17]
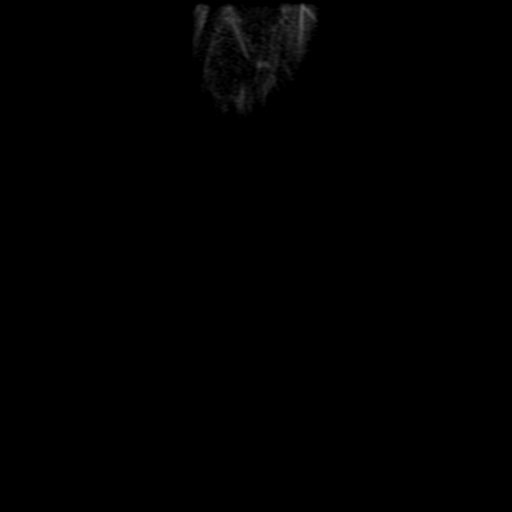

[Series 14: T1 post-contrast · oblique · 4.0mm · 0.29mm/px · 4 of 20 slices shown (3 of 3)]
[im 1/20]
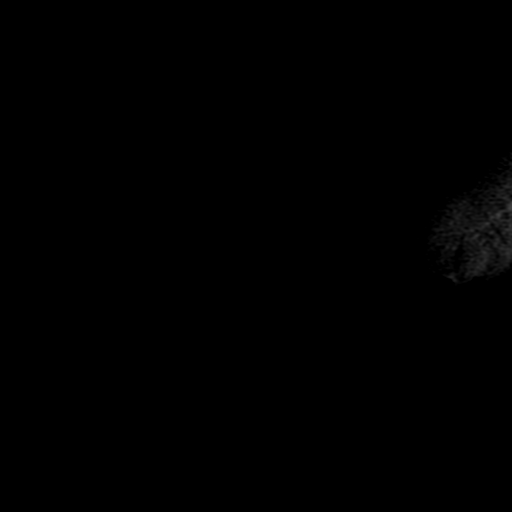
[im 7/20]
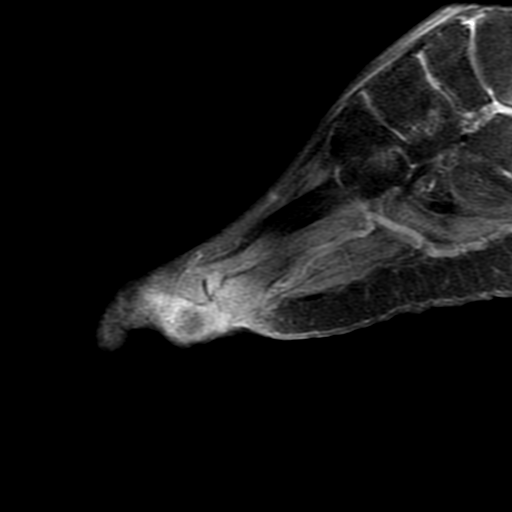
[im 13/20]
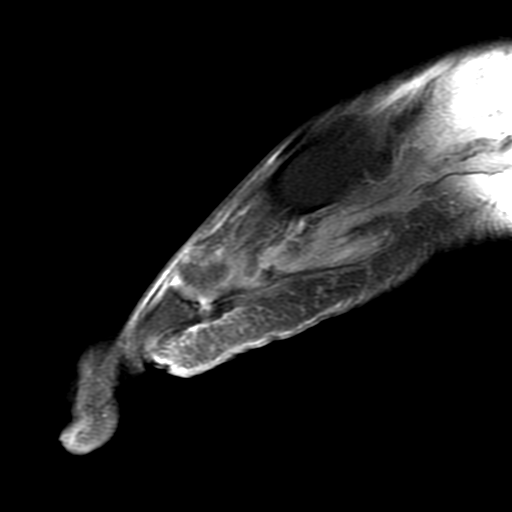
[im 20/20]
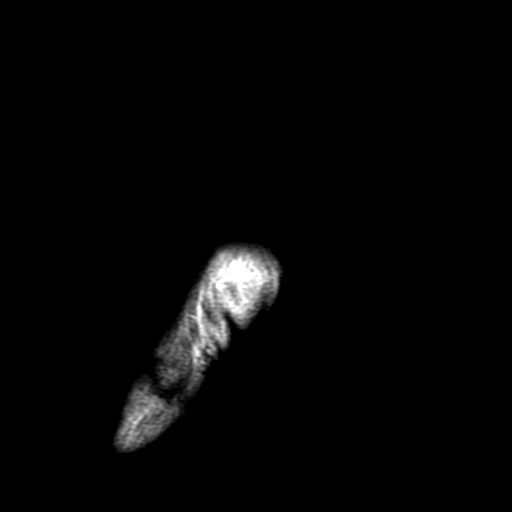

[27 of 40 positions shown; findings below may reference images not displayed]

FINDINGS: There is plantar forefoot soft tissue ulceration at the level of the
fourth and fifth MTP joints. The soft tissue ulceration extends to
the bone. There is associated diffuse soft tissue enhancement, but
no focal fluid collection.

There is dorsal subluxation at the fourth and fifth MTP joints.
There is abnormal T2 and T1 signal within the fourth and fifth
metatarsal heads with associated cortical destruction and
enhancement following contrast. There is also signal alteration and
abnormal enhancement within the bases of the fourth and fifth
proximal phalanges. As these osseous changes are directly adjacent
to the soft tissue ulceration, there are highly suspicious for
osteomyelitis.

The first, second and third rays demonstrate no significant
findings. The alignment is normal at the Lisfranc joint. The
visualized bones of the midfoot appear unremarkable.
IMPRESSION: 1. Osteomyelitis of the fourth and fifth metatarsal heads and the
adjacent proximal phalangeal bases.
2. Associated soft tissue ulceration without abscess.

## 2017-04-01 ENCOUNTER — Encounter (HOSPITAL_COMMUNITY): Payer: Self-pay

## 2017-04-01 ENCOUNTER — Emergency Department (HOSPITAL_COMMUNITY)
Admission: EM | Admit: 2017-04-01 | Discharge: 2017-04-01 | Disposition: A | Discharge status: Home / Self Care | Payer: Medicaid Other | Attending: Emergency Medicine | Admitting: Emergency Medicine

## 2017-04-01 DIAGNOSIS — Z87891 Personal history of nicotine dependence: Secondary | ICD-10-CM | POA: Insufficient documentation

## 2017-04-01 DIAGNOSIS — R338 Other retention of urine: Secondary | ICD-10-CM | POA: Diagnosis not present

## 2017-04-01 DIAGNOSIS — Z79899 Other long term (current) drug therapy: Secondary | ICD-10-CM | POA: Insufficient documentation

## 2017-04-01 DIAGNOSIS — R319 Hematuria, unspecified: Secondary | ICD-10-CM | POA: Diagnosis present

## 2017-04-01 MED ORDER — GABAPENTIN 300 MG PO CAPS
600.0000 mg | ORAL_CAPSULE | Freq: Once | ORAL | Status: AC
  Administered 2017-04-01: 600 mg via ORAL
  Filled 2017-04-01: qty 2

## 2017-04-01 MED ORDER — ONDANSETRON 4 MG PO TBDP
ORAL_TABLET | ORAL | Status: AC
  Filled 2017-04-01: qty 1

## 2017-04-01 MED ORDER — BACLOFEN 10 MG PO TABS
10.0000 mg | ORAL_TABLET | Freq: Once | ORAL | Status: AC
  Administered 2017-04-01: 10 mg via ORAL
  Filled 2017-04-01: qty 1

## 2017-04-01 MED ORDER — OXYCODONE-ACETAMINOPHEN 5-325 MG PO TABS
1.0000 | ORAL_TABLET | Freq: Once | ORAL | Status: AC
  Administered 2017-04-01: 1 via ORAL
  Filled 2017-04-01: qty 1

## 2017-04-01 MED ORDER — ONDANSETRON 4 MG PO TBDP
4.0000 mg | ORAL_TABLET | Freq: Once | ORAL | Status: AC
  Administered 2017-04-01: 4 mg via ORAL

## 2017-04-01 NOTE — ED Notes (Signed)
Bed: WA08 Expected date:  Expected time:  Means of arrival:  Comments: 

## 2017-04-01 NOTE — ED Notes (Signed)
Urologist placed patient's 34F Coude Catheter. Small amount of urethral bleeding noted, but no hematuria noted. Patient tolerated procedure well.

## 2017-04-01 NOTE — Procedures (Signed)
Foley Catheter Placement Note  Indications: 46 y.o. 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 Became dislodged today with unsuccessful attempts at replacement. Has seen Dr. Karsten Ro in the past for bladder management.   Pre-operative Diagnosis: Urinary retention  Post-operative Diagnosis: Same  Surgeon: Stasia Cavalier, MD  Assistants: None  Procedure Details  Patient was placed in the supine position, prepped with Betadine and draped in the usual sterile fashion.  We injected lidocaine jelly per urethra prior to the procedure.  We then inserted a 18Fr Coude catheter per urethra which easily passed into the bladder without any resistance at the prostatic urethra.  We achieved return of clear yellow urine and then proceeded to insert 10 mL of sterile water into the Foley balloon.  The catheter was attached to a drainage bag and secured with a StatLock.  Placement of the catheter had return of greater than 500 mL of clear pink urine.               Complications: None; patient tolerated the procedure well.  Plan:   - [place] for discharge from a urology standpoint. Temporary gross hematuria is expected given likely urethral mucosal injury. Ok to flush prn for clots, though should return if the catheter does not drain after repeated irrigation.  - Patient to follow up with Dr. Karsten Ro in 2-3 weeks for discussed on SPT placement. This has been requested.       Attending Attestation: Dr. Alinda Money was available.

## 2017-04-01 NOTE — ED Notes (Signed)
[facility] called for patient transport back to [facility]

## 2017-04-01 NOTE — ED Notes (Signed)
Dr Vanita Panda called to patient's bedside to evaluate patient's low blood pressure, nausea, and abdominal pain. Patient alert and oriented x4. Patient cleared for transfer by Dr Vanita Panda despite hypotension.

## 2017-04-01 NOTE — Discharge Instructions (Signed)
As discussed, your evaluation today has been largely reassuring.  But, it is important that you monitor your condition carefully, and do not hesitate to return to the ED if you develop new, or concerning changes in your condition. ? ?Otherwise, please follow-up with your physician for appropriate ongoing care. ? ?

## 2017-04-01 NOTE — ED Notes (Signed)
Urologist at patient's bedside.

## 2017-04-01 NOTE — ED Triage Notes (Signed)
Patient BIB [facility] from [facility]. Patient c/o bleeding from his urethra during routine change of indwelling catheter. Patient reports during insertion, the catheter "didnt feel right" and the patient felt his catheter should have been advanced further. After advancement, bleeding noted from urethra and in catheter. Foley catheter removed and bleeding continued, per report. Upon EMS arrival, patient was diaphoretic and hypertensive. Patient is paraplegic and is being treated for osteomylitis. Multiple wounds noted to patient's sacrum in triage.

## 2017-04-01 NOTE — ED Triage Notes (Signed)
Pt from [facility] sent for bleeding from his urethra after catheter insertion. Bleeding has stopped upon EMS arrival. Pt complains of abdominal pain after catheter insertion.

## 2017-04-01 NOTE — ED Notes (Addendum)
Pt. B/P WAS 64/45.  Checked B/P the second time it was reading 65/44. Nurse and Dr. Vanita Panda aware of the B/P.
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
## 2018-02-24 ENCOUNTER — Encounter: Payer: Self-pay | Admitting: Internal Medicine

## 2020-01-13 ENCOUNTER — Emergency Department (HOSPITAL_COMMUNITY): Payer: Medicaid Other

## 2020-01-13 ENCOUNTER — Inpatient Hospital Stay (HOSPITAL_COMMUNITY)
Admission: EM | Admit: 2020-01-13 | Discharge: 2020-02-05 | DRG: 391 | Disposition: E | Payer: Medicaid Other | Source: Skilled Nursing Facility | Attending: Family Medicine | Admitting: Family Medicine

## 2020-01-13 DIAGNOSIS — Z1624 Resistance to multiple antibiotics: Secondary | ICD-10-CM | POA: Diagnosis present

## 2020-01-13 DIAGNOSIS — K5903 Drug induced constipation: Secondary | ICD-10-CM | POA: Diagnosis present

## 2020-01-13 DIAGNOSIS — L89154 Pressure ulcer of sacral region, stage 4: Secondary | ICD-10-CM | POA: Diagnosis present

## 2020-01-13 DIAGNOSIS — E871 Hypo-osmolality and hyponatremia: Secondary | ICD-10-CM | POA: Diagnosis present

## 2020-01-13 DIAGNOSIS — N12 Tubulo-interstitial nephritis, not specified as acute or chronic: Secondary | ICD-10-CM | POA: Diagnosis present

## 2020-01-13 DIAGNOSIS — M549 Dorsalgia, unspecified: Secondary | ICD-10-CM | POA: Diagnosis present

## 2020-01-13 DIAGNOSIS — X58XXXA Exposure to other specified factors, initial encounter: Secondary | ICD-10-CM | POA: Diagnosis present

## 2020-01-13 DIAGNOSIS — Z66 Do not resuscitate: Secondary | ICD-10-CM | POA: Diagnosis present

## 2020-01-13 DIAGNOSIS — E440 Moderate protein-calorie malnutrition: Secondary | ICD-10-CM | POA: Diagnosis present

## 2020-01-13 DIAGNOSIS — K633 Ulcer of intestine: Secondary | ICD-10-CM

## 2020-01-13 DIAGNOSIS — N390 Urinary tract infection, site not specified: Secondary | ICD-10-CM | POA: Diagnosis present

## 2020-01-13 DIAGNOSIS — A419 Sepsis, unspecified organism: Secondary | ICD-10-CM | POA: Diagnosis not present

## 2020-01-13 DIAGNOSIS — R140 Abdominal distension (gaseous): Secondary | ICD-10-CM

## 2020-01-13 DIAGNOSIS — Z8249 Family history of ischemic heart disease and other diseases of the circulatory system: Secondary | ICD-10-CM

## 2020-01-13 DIAGNOSIS — D649 Anemia, unspecified: Secondary | ICD-10-CM | POA: Diagnosis present

## 2020-01-13 DIAGNOSIS — Z5329 Procedure and treatment not carried out because of patient's decision for other reasons: Secondary | ICD-10-CM | POA: Diagnosis not present

## 2020-01-13 DIAGNOSIS — F1120 Opioid dependence, uncomplicated: Secondary | ICD-10-CM | POA: Diagnosis present

## 2020-01-13 DIAGNOSIS — G8929 Other chronic pain: Secondary | ICD-10-CM

## 2020-01-13 DIAGNOSIS — K219 Gastro-esophageal reflux disease without esophagitis: Secondary | ICD-10-CM | POA: Diagnosis present

## 2020-01-13 DIAGNOSIS — F329 Major depressive disorder, single episode, unspecified: Secondary | ICD-10-CM | POA: Diagnosis present

## 2020-01-13 DIAGNOSIS — R64 Cachexia: Secondary | ICD-10-CM | POA: Diagnosis present

## 2020-01-13 DIAGNOSIS — E872 Acidosis: Secondary | ICD-10-CM | POA: Diagnosis present

## 2020-01-13 DIAGNOSIS — N319 Neuromuscular dysfunction of bladder, unspecified: Secondary | ICD-10-CM | POA: Diagnosis present

## 2020-01-13 DIAGNOSIS — K56609 Unspecified intestinal obstruction, unspecified as to partial versus complete obstruction: Secondary | ICD-10-CM | POA: Diagnosis present

## 2020-01-13 DIAGNOSIS — K5649 Other impaction of intestine: Secondary | ICD-10-CM | POA: Diagnosis not present

## 2020-01-13 DIAGNOSIS — Z6820 Body mass index (BMI) 20.0-20.9, adult: Secondary | ICD-10-CM | POA: Diagnosis not present

## 2020-01-13 DIAGNOSIS — K5289 Other specified noninfective gastroenteritis and colitis: Principal | ICD-10-CM | POA: Diagnosis present

## 2020-01-13 DIAGNOSIS — D473 Essential (hemorrhagic) thrombocythemia: Secondary | ICD-10-CM | POA: Diagnosis present

## 2020-01-13 DIAGNOSIS — N1339 Other hydronephrosis: Secondary | ICD-10-CM | POA: Diagnosis present

## 2020-01-13 DIAGNOSIS — R109 Unspecified abdominal pain: Secondary | ICD-10-CM

## 2020-01-13 DIAGNOSIS — Z20822 Contact with and (suspected) exposure to covid-19: Secondary | ICD-10-CM | POA: Diagnosis present

## 2020-01-13 DIAGNOSIS — G8220 Paraplegia, unspecified: Secondary | ICD-10-CM | POA: Diagnosis present

## 2020-01-13 DIAGNOSIS — M4628 Osteomyelitis of vertebra, sacral and sacrococcygeal region: Secondary | ICD-10-CM | POA: Diagnosis present

## 2020-01-13 DIAGNOSIS — G894 Chronic pain syndrome: Secondary | ICD-10-CM | POA: Diagnosis present

## 2020-01-13 DIAGNOSIS — Z86718 Personal history of other venous thrombosis and embolism: Secondary | ICD-10-CM | POA: Diagnosis not present

## 2020-01-13 DIAGNOSIS — Z833 Family history of diabetes mellitus: Secondary | ICD-10-CM

## 2020-01-13 DIAGNOSIS — R683 Clubbing of fingers: Secondary | ICD-10-CM | POA: Diagnosis present

## 2020-01-13 DIAGNOSIS — Z8611 Personal history of tuberculosis: Secondary | ICD-10-CM

## 2020-01-13 DIAGNOSIS — Z95828 Presence of other vascular implants and grafts: Secondary | ICD-10-CM

## 2020-01-13 DIAGNOSIS — E876 Hypokalemia: Secondary | ICD-10-CM | POA: Diagnosis not present

## 2020-01-13 DIAGNOSIS — I959 Hypotension, unspecified: Secondary | ICD-10-CM | POA: Diagnosis present

## 2020-01-13 DIAGNOSIS — Z87891 Personal history of nicotine dependence: Secondary | ICD-10-CM

## 2020-01-13 DIAGNOSIS — Z7401 Bed confinement status: Secondary | ICD-10-CM

## 2020-01-13 DIAGNOSIS — E861 Hypovolemia: Secondary | ICD-10-CM | POA: Diagnosis present

## 2020-01-13 DIAGNOSIS — R0682 Tachypnea, not elsewhere classified: Secondary | ICD-10-CM

## 2020-01-13 DIAGNOSIS — T402X5A Adverse effect of other opioids, initial encounter: Secondary | ICD-10-CM | POA: Diagnosis present

## 2020-01-13 DIAGNOSIS — R000 Tachycardia, unspecified: Secondary | ICD-10-CM | POA: Diagnosis present

## 2020-01-13 DIAGNOSIS — L89159 Pressure ulcer of sacral region, unspecified stage: Secondary | ICD-10-CM | POA: Diagnosis not present

## 2020-01-13 DIAGNOSIS — Z9981 Dependence on supplemental oxygen: Secondary | ICD-10-CM

## 2020-01-13 DIAGNOSIS — Z87440 Personal history of urinary (tract) infections: Secondary | ICD-10-CM

## 2020-01-13 DIAGNOSIS — Z79899 Other long term (current) drug therapy: Secondary | ICD-10-CM

## 2020-01-13 DIAGNOSIS — K5900 Constipation, unspecified: Secondary | ICD-10-CM

## 2020-01-13 DIAGNOSIS — R509 Fever, unspecified: Secondary | ICD-10-CM | POA: Diagnosis not present

## 2020-01-13 DIAGNOSIS — K529 Noninfective gastroenteritis and colitis, unspecified: Secondary | ICD-10-CM

## 2020-01-13 DIAGNOSIS — Z803 Family history of malignant neoplasm of breast: Secondary | ICD-10-CM

## 2020-01-13 LAB — COMPREHENSIVE METABOLIC PANEL
ALT: 10 U/L (ref 0–44)
AST: 13 U/L — ABNORMAL LOW (ref 15–41)
Albumin: 2.6 g/dL — ABNORMAL LOW (ref 3.5–5.0)
Alkaline Phosphatase: 80 U/L (ref 38–126)
Anion gap: 14 (ref 5–15)
BUN: 31 mg/dL — ABNORMAL HIGH (ref 6–20)
CO2: 20 mmol/L — ABNORMAL LOW (ref 22–32)
Calcium: 9.4 mg/dL (ref 8.9–10.3)
Chloride: 97 mmol/L — ABNORMAL LOW (ref 98–111)
Creatinine, Ser: 0.82 mg/dL (ref 0.61–1.24)
GFR calc Af Amer: >60 mL/min (ref >60)
GFR calc non Af Amer: >60 mL/min (ref >60)
Glucose, Bld: 144 mg/dL — ABNORMAL HIGH (ref 70–99)
Potassium: 4.3 mmol/L (ref 3.5–5.1)
Sodium: 131 mmol/L — ABNORMAL LOW (ref 135–145)
Total Bilirubin: 0.6 mg/dL (ref 0.3–1.2)
Total Protein: 9.6 g/dL — ABNORMAL HIGH (ref 6.5–8.1)

## 2020-01-13 LAB — CREATININE, SERUM
Creatinine, Ser: 0.69 mg/dL (ref 0.61–1.24)
GFR calc Af Amer: >60 mL/min (ref >60)
GFR calc non Af Amer: >60 mL/min (ref >60)

## 2020-01-13 LAB — SARS CORONAVIRUS 2 BY RT PCR (HOSPITAL ORDER, PERFORMED IN ~~LOC~~ HOSPITAL LAB): SARS Coronavirus 2: NEGATIVE

## 2020-01-13 LAB — CBC
HCT: 39.5 % (ref 39.0–52.0)
Hemoglobin: 12.3 g/dL — ABNORMAL LOW (ref 13.0–17.0)
MCH: 27.7 pg (ref 26.0–34.0)
MCHC: 31.1 g/dL (ref 30.0–36.0)
MCV: 89 fL (ref 80.0–100.0)
Platelets: 623 10*3/uL — ABNORMAL HIGH (ref 150–400)
RBC: 4.44 MIL/uL (ref 4.22–5.81)
RDW: 15.4 % (ref 11.5–15.5)
WBC: 13.7 10*3/uL — ABNORMAL HIGH (ref 4.0–10.5)
nRBC: 0 % (ref 0.0–0.2)

## 2020-01-13 LAB — CBC WITH DIFFERENTIAL/PLATELET
Abs Immature Granulocytes: 0.06 10*3/uL (ref 0.00–0.07)
Basophils Absolute: 0.1 10*3/uL (ref 0.0–0.1)
Basophils Relative: 0 %
Eosinophils Absolute: 0 10*3/uL (ref 0.0–0.5)
Eosinophils Relative: 0 %
HCT: 40.7 % (ref 39.0–52.0)
Hemoglobin: 12.9 g/dL — ABNORMAL LOW (ref 13.0–17.0)
Immature Granulocytes: 0 %
Lymphocytes Relative: 9 %
Lymphs Abs: 1.4 10*3/uL (ref 0.7–4.0)
MCH: 27.7 pg (ref 26.0–34.0)
MCHC: 31.7 g/dL (ref 30.0–36.0)
MCV: 87.5 fL (ref 80.0–100.0)
Monocytes Absolute: 1.1 10*3/uL — ABNORMAL HIGH (ref 0.1–1.0)
Monocytes Relative: 7 %
Neutro Abs: 13.3 10*3/uL — ABNORMAL HIGH (ref 1.7–7.7)
Neutrophils Relative %: 84 %
Platelets: 718 10*3/uL — ABNORMAL HIGH (ref 150–400)
RBC: 4.65 MIL/uL (ref 4.22–5.81)
RDW: 15.4 % (ref 11.5–15.5)
WBC: 15.9 10*3/uL — ABNORMAL HIGH (ref 4.0–10.5)
nRBC: 0 % (ref 0.0–0.2)

## 2020-01-13 LAB — HIV ANTIBODY (ROUTINE TESTING W REFLEX): HIV Screen 4th Generation wRfx: NONREACTIVE

## 2020-01-13 LAB — LACTIC ACID, PLASMA
Lactic Acid, Venous: 2.1 mmol/L (ref 0.5–1.9)
Lactic Acid, Venous: 2.6 mmol/L (ref 0.5–1.9)

## 2020-01-13 MED ORDER — SODIUM CHLORIDE 0.9 % IV BOLUS
1000.0000 mL | Freq: Once | INTRAVENOUS | Status: AC
  Administered 2020-01-13: 1000 mL via INTRAVENOUS

## 2020-01-13 MED ORDER — OXYBUTYNIN CHLORIDE ER 10 MG PO TB24
10.0000 mg | ORAL_TABLET | Freq: Every day | ORAL | Status: DC
  Administered 2020-01-13 – 2020-01-18 (×5): 10 mg via ORAL
  Filled 2020-01-13 (×7): qty 1

## 2020-01-13 MED ORDER — ONDANSETRON HCL 4 MG PO TABS: 4.0000 mg | ORAL_TABLET | Freq: Four times a day (QID) | ORAL | Status: DC | PRN

## 2020-01-13 MED ORDER — HYDROMORPHONE HCL 1 MG/ML IJ SOLN
1.0000 mg | Freq: Once | INTRAMUSCULAR | Status: AC
  Administered 2020-01-13: 1 mg via INTRAVENOUS
  Filled 2020-01-13: qty 1

## 2020-01-13 MED ORDER — MINERAL OIL RE ENEM
1.0000 | ENEMA | Freq: Once | RECTAL | Status: AC
  Administered 2020-01-14: 1 via RECTAL
  Filled 2020-01-13 (×2): qty 1

## 2020-01-13 MED ORDER — SODIUM CHLORIDE 0.9% FLUSH
10.0000 mL | Freq: Two times a day (BID) | INTRAVENOUS | Status: DC
  Administered 2020-01-13 – 2020-01-18 (×7): 10 mL

## 2020-01-13 MED ORDER — OXYCODONE-ACETAMINOPHEN 5-325 MG PO TABS
1.0000 | ORAL_TABLET | Freq: Four times a day (QID) | ORAL | Status: DC | PRN
  Administered 2020-01-14 – 2020-01-18 (×13): 1 via ORAL
  Filled 2020-01-13 (×14): qty 1

## 2020-01-13 MED ORDER — ALBUTEROL SULFATE (2.5 MG/3ML) 0.083% IN NEBU: 2.5000 mg | INHALATION_SOLUTION | Freq: Once | RESPIRATORY_TRACT | Status: DC

## 2020-01-13 MED ORDER — METRONIDAZOLE IN NACL 5-0.79 MG/ML-% IV SOLN
500.0000 mg | Freq: Once | INTRAVENOUS | Status: AC
  Administered 2020-01-13: 500 mg via INTRAVENOUS
  Filled 2020-01-13: qty 100

## 2020-01-13 MED ORDER — ACETAMINOPHEN 500 MG PO TABS
1000.0000 mg | ORAL_TABLET | Freq: Four times a day (QID) | ORAL | Status: DC | PRN
  Administered 2020-01-17: 1000 mg via ORAL
  Filled 2020-01-13: qty 2

## 2020-01-13 MED ORDER — FLEET ENEMA 7-19 GM/118ML RE ENEM: 1.0000 | ENEMA | Freq: Once | RECTAL | Status: DC | PRN

## 2020-01-13 MED ORDER — SODIUM CHLORIDE 0.9% FLUSH: 10.0000 mL | INTRAVENOUS | Status: DC | PRN

## 2020-01-13 MED ORDER — ALBUTEROL SULFATE (2.5 MG/3ML) 0.083% IN NEBU: 2.5000 mg | INHALATION_SOLUTION | Freq: Four times a day (QID) | RESPIRATORY_TRACT | Status: DC | PRN

## 2020-01-13 MED ORDER — MORPHINE SULFATE ER 15 MG PO TBCR
15.0000 mg | EXTENDED_RELEASE_TABLET | Freq: Two times a day (BID) | ORAL | Status: DC
  Administered 2020-01-13 – 2020-01-18 (×11): 15 mg via ORAL
  Filled 2020-01-13 (×11): qty 1

## 2020-01-13 MED ORDER — POLYETHYLENE GLYCOL 3350 17 G PO PACK
17.0000 g | PACK | Freq: Two times a day (BID) | ORAL | Status: DC
  Administered 2020-01-13 – 2020-01-17 (×6): 17 g via ORAL
  Filled 2020-01-13 (×9): qty 1

## 2020-01-13 MED ORDER — ONDANSETRON HCL 4 MG/2ML IJ SOLN
4.0000 mg | Freq: Once | INTRAMUSCULAR | Status: AC
  Administered 2020-01-13: 4 mg via INTRAVENOUS
  Filled 2020-01-13: qty 2

## 2020-01-13 MED ORDER — SENNA 8.6 MG PO TABS
1.0000 | ORAL_TABLET | Freq: Two times a day (BID) | ORAL | Status: DC
  Administered 2020-01-13 – 2020-01-18 (×7): 8.6 mg via ORAL
  Filled 2020-01-13 (×8): qty 1

## 2020-01-13 MED ORDER — ENOXAPARIN SODIUM 40 MG/0.4ML ~~LOC~~ SOLN
40.0000 mg | SUBCUTANEOUS | Status: DC
  Administered 2020-01-13 – 2020-01-18 (×6): 40 mg via SUBCUTANEOUS
  Filled 2020-01-13 (×6): qty 0.4

## 2020-01-13 MED ORDER — SODIUM CHLORIDE 0.9 % IV SOLN: INTRAVENOUS | Status: DC

## 2020-01-13 MED ORDER — SODIUM CHLORIDE 0.9 % IV BOLUS
500.0000 mL | Freq: Once | INTRAVENOUS | Status: AC
  Administered 2020-01-13: 500 mL via INTRAVENOUS

## 2020-01-13 MED ORDER — SODIUM CHLORIDE 0.9 % IV SOLN
2.0000 g | Freq: Once | INTRAVENOUS | Status: AC
  Administered 2020-01-13: 2 g via INTRAVENOUS
  Filled 2020-01-13: qty 2

## 2020-01-13 MED ORDER — SODIUM CHLORIDE 0.9 % IV SOLN
2.0000 g | Freq: Three times a day (TID) | INTRAVENOUS | Status: DC
  Administered 2020-01-14 – 2020-01-16 (×8): 2 g via INTRAVENOUS
  Filled 2020-01-13 (×9): qty 2

## 2020-01-13 MED ORDER — ACETAMINOPHEN 650 MG RE SUPP: 650.0000 mg | Freq: Four times a day (QID) | RECTAL | Status: DC | PRN

## 2020-01-13 MED ORDER — IOHEXOL 300 MG/ML  SOLN
100.0000 mL | Freq: Once | INTRAMUSCULAR | Status: AC | PRN
  Administered 2020-01-13: 100 mL via INTRAVENOUS

## 2020-01-13 NOTE — ED Triage Notes (Signed)
Patient from [facility] for bowel obstruction x1 day, patient had three bowel movements after facility called 911 and before he arrived to [facility]. Patient complaining of nausea and abdominal pain.

## 2020-01-13 NOTE — ED Notes (Signed)
Pt currently in CT. Will return to assess pt.

## 2020-01-13 NOTE — ED Notes (Signed)
IV team at bedside 
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
## 2020-01-13 NOTE — ED Notes (Signed)
Admitting at bedside 
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
## 2020-01-13 NOTE — ED Notes (Signed)
RN assigned to [id number] not available to take report. RN provided call back number.

## 2020-01-13 NOTE — Progress Notes (Signed)
Pharmacy Antibiotic Note  [facility] is a 49 y.o. male admitted on 01/11/2020 with intra-abdominal infection.  Pharmacy has been consulted for Cefepime dosing. Of note, patient has a dose of metronidazole ordered. WBC elevated. SCr wnl. LA 2.1. AF   Plan: -Cefepime 2 gm IV Q 8 hours -Monitor CBC, renal fx, cultures and clinical progress     Temp (24hrs), Avg:99.5 F (37.5 C), Min:99.5 F (37.5 C), Max:99.5 F (37.5 C)  Recent Labs  Lab 01/26/2020 1638  WBC 15.9*  CREATININE 0.82  LATICACIDVEN 2.1*    CrCl cannot be calculated (Unknown ideal weight.).    No Known Allergies  Antimicrobials this admission: Cefepime 7/9 >>  Metronidazole 7/9 >>   Dose adjustments this admission:  Thank you for allowing pharmacy to be a part of this patient's care.  Albertina Parr, PharmD., BCPS, BCCCP Clinical Pharmacist Clinical phone for 01/22/2020 until 11:30pm: [phone] If after 11:30pm, please refer to [facility] for unit-specific pharmacist

## 2020-01-13 NOTE — ED Notes (Signed)
XR at bedside

## 2020-01-14 ENCOUNTER — Inpatient Hospital Stay (HOSPITAL_COMMUNITY): Payer: Medicaid Other

## 2020-01-14 DIAGNOSIS — K529 Noninfective gastroenteritis and colitis, unspecified: Secondary | ICD-10-CM

## 2020-01-14 LAB — BASIC METABOLIC PANEL
Anion gap: 12 (ref 5–15)
BUN: 25 mg/dL — ABNORMAL HIGH (ref 6–20)
CO2: 19 mmol/L — ABNORMAL LOW (ref 22–32)
Calcium: 8.9 mg/dL (ref 8.9–10.3)
Chloride: 102 mmol/L (ref 98–111)
Creatinine, Ser: 0.56 mg/dL — ABNORMAL LOW (ref 0.61–1.24)
GFR calc Af Amer: >60 mL/min (ref >60)
GFR calc non Af Amer: >60 mL/min (ref >60)
Glucose, Bld: 142 mg/dL — ABNORMAL HIGH (ref 70–99)
Potassium: 4.2 mmol/L (ref 3.5–5.1)
Sodium: 133 mmol/L — ABNORMAL LOW (ref 135–145)

## 2020-01-14 LAB — CBC
HCT: 34.7 % — ABNORMAL LOW (ref 39.0–52.0)
Hemoglobin: 10.8 g/dL — ABNORMAL LOW (ref 13.0–17.0)
MCH: 27.5 pg (ref 26.0–34.0)
MCHC: 31.1 g/dL (ref 30.0–36.0)
MCV: 88.3 fL (ref 80.0–100.0)
Platelets: 663 10*3/uL — ABNORMAL HIGH (ref 150–400)
RBC: 3.93 MIL/uL — ABNORMAL LOW (ref 4.22–5.81)
RDW: 15.2 % (ref 11.5–15.5)
WBC: 15.8 10*3/uL — ABNORMAL HIGH (ref 4.0–10.5)
nRBC: 0 % (ref 0.0–0.2)

## 2020-01-14 LAB — URINALYSIS, ROUTINE W REFLEX MICROSCOPIC
Bilirubin Urine: NEGATIVE
Glucose, UA: NEGATIVE mg/dL
Ketones, ur: 5 mg/dL — AB
Nitrite: NEGATIVE
Protein, ur: 30 mg/dL — AB
Specific Gravity, Urine: 1.026 (ref 1.005–1.030)
pH: 5 (ref 5.0–8.0)

## 2020-01-14 LAB — LACTIC ACID, PLASMA: Lactic Acid, Venous: 1.5 mmol/L (ref 0.5–1.9)

## 2020-01-14 MED ORDER — GABAPENTIN 100 MG PO CAPS
200.0000 mg | ORAL_CAPSULE | Freq: Three times a day (TID) | ORAL | Status: DC
  Administered 2020-01-14 (×2): 200 mg via ORAL
  Filled 2020-01-14 (×2): qty 2

## 2020-01-14 MED ORDER — PROMETHAZINE HCL 25 MG/ML IJ SOLN
12.5000 mg | Freq: Once | INTRAMUSCULAR | Status: AC
  Administered 2020-01-14: 12.5 mg via INTRAVENOUS
  Filled 2020-01-14: qty 1

## 2020-01-14 MED ORDER — ENSURE ENLIVE PO LIQD: 237.0000 mL | Freq: Two times a day (BID) | ORAL | Status: DC

## 2020-01-14 MED ORDER — PROMETHAZINE HCL 12.5 MG PO TABS
12.5000 mg | ORAL_TABLET | Freq: Four times a day (QID) | ORAL | Status: DC | PRN
  Administered 2020-01-15 – 2020-01-17 (×7): 12.5 mg via ORAL
  Filled 2020-01-14 (×9): qty 1

## 2020-01-14 MED ORDER — CHLORHEXIDINE GLUCONATE CLOTH 2 % EX PADS
6.0000 | MEDICATED_PAD | Freq: Every day | CUTANEOUS | Status: DC
  Administered 2020-01-14 – 2020-01-18 (×4): 6 via TOPICAL

## 2020-01-14 MED ORDER — BACLOFEN 5 MG HALF TABLET
5.0000 mg | ORAL_TABLET | Freq: Three times a day (TID) | ORAL | Status: DC | PRN
  Administered 2020-01-14 – 2020-01-17 (×7): 5 mg via ORAL
  Filled 2020-01-14 (×10): qty 1
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
## 2020-01-14 NOTE — Progress Notes (Signed)
PT Cancellation Note/ Screen/ Discharge  Patient Details Name: [facility] MRN: [id number] DOB: September 19, 1970   Cancelled Treatment:    Reason Eval/Treat Not Completed: PT screened, no needs identified, will sign off (pt long term SNF resident who has not been OOB in years. Pt assists with rolling, upper body bathing and grooming. Pt with contractures of bil LE and left hand and educated for need to request positioning and ROM of long term staff. Pt at baseline functional status without further need for acute therapy and able to state importance of repositioning for wound care. Pt also with resting HR 138 and BP 178/112 and not appropriate for mobility stating back pain 10/10 with RN aware.)   Lenzie Sandler B Deara Bober 01/14/2020, 10:05 AM  Bayard Males, PT Acute Rehabilitation Services Pager: [phone] Office: [phone]

## 2020-01-14 NOTE — Plan of Care (Signed)
POC open and progressing. 
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
## 2020-01-15 DIAGNOSIS — G8929 Other chronic pain: Secondary | ICD-10-CM

## 2020-01-15 DIAGNOSIS — K5900 Constipation, unspecified: Secondary | ICD-10-CM

## 2020-01-15 DIAGNOSIS — E871 Hypo-osmolality and hyponatremia: Secondary | ICD-10-CM

## 2020-01-15 DIAGNOSIS — G8220 Paraplegia, unspecified: Secondary | ICD-10-CM

## 2020-01-15 LAB — CBC WITH DIFFERENTIAL/PLATELET
Abs Immature Granulocytes: 0.04 10*3/uL (ref 0.00–0.07)
Basophils Absolute: 0.1 10*3/uL (ref 0.0–0.1)
Basophils Relative: 1 %
Eosinophils Absolute: 0 10*3/uL (ref 0.0–0.5)
Eosinophils Relative: 0 %
HCT: 31.9 % — ABNORMAL LOW (ref 39.0–52.0)
Hemoglobin: 9.7 g/dL — ABNORMAL LOW (ref 13.0–17.0)
Immature Granulocytes: 0 %
Lymphocytes Relative: 18 %
Lymphs Abs: 2.1 10*3/uL (ref 0.7–4.0)
MCH: 27.5 pg (ref 26.0–34.0)
MCHC: 30.4 g/dL (ref 30.0–36.0)
MCV: 90.4 fL (ref 80.0–100.0)
Monocytes Absolute: 1.5 10*3/uL — ABNORMAL HIGH (ref 0.1–1.0)
Monocytes Relative: 12 %
Neutro Abs: 8.4 10*3/uL — ABNORMAL HIGH (ref 1.7–7.7)
Neutrophils Relative %: 69 %
Platelets: 686 10*3/uL — ABNORMAL HIGH (ref 150–400)
RBC: 3.53 MIL/uL — ABNORMAL LOW (ref 4.22–5.81)
RDW: 15.1 % (ref 11.5–15.5)
Smear Review: ADEQUATE
WBC: 12.1 10*3/uL — ABNORMAL HIGH (ref 4.0–10.5)
nRBC: 0 % (ref 0.0–0.2)

## 2020-01-15 LAB — BASIC METABOLIC PANEL
Anion gap: 13 (ref 5–15)
BUN: 24 mg/dL — ABNORMAL HIGH (ref 6–20)
CO2: 20 mmol/L — ABNORMAL LOW (ref 22–32)
Calcium: 9.4 mg/dL (ref 8.9–10.3)
Chloride: 102 mmol/L (ref 98–111)
Creatinine, Ser: 0.74 mg/dL (ref 0.61–1.24)
GFR calc Af Amer: >60 mL/min (ref >60)
GFR calc non Af Amer: >60 mL/min (ref >60)
Glucose, Bld: 101 mg/dL — ABNORMAL HIGH (ref 70–99)
Potassium: 3.5 mmol/L (ref 3.5–5.1)
Sodium: 135 mmol/L (ref 135–145)

## 2020-01-15 MED ORDER — GABAPENTIN 300 MG PO CAPS
300.0000 mg | ORAL_CAPSULE | Freq: Three times a day (TID) | ORAL | Status: DC
  Administered 2020-01-15: 300 mg via ORAL
  Filled 2020-01-15: qty 1

## 2020-01-15 MED ORDER — GABAPENTIN 300 MG PO CAPS
600.0000 mg | ORAL_CAPSULE | Freq: Three times a day (TID) | ORAL | Status: DC
  Administered 2020-01-15 – 2020-01-18 (×11): 600 mg via ORAL
  Filled 2020-01-15 (×11): qty 2

## 2020-01-15 MED ORDER — FLEET ENEMA 7-19 GM/118ML RE ENEM
1.0000 | ENEMA | Freq: Once | RECTAL | Status: AC
  Administered 2020-01-15: 1 via RECTAL
  Filled 2020-01-15: qty 1

## 2020-01-15 MED ORDER — SORBITOL 70 % SOLN
960.0000 mL | TOPICAL_OIL | Freq: Once | ORAL | Status: DC
  Filled 2020-01-15: qty 473

## 2020-01-15 MED ORDER — MUPIROCIN CALCIUM 2 % EX CREA
TOPICAL_CREAM | Freq: Two times a day (BID) | CUTANEOUS | Status: DC
  Administered 2020-01-17: 1 via TOPICAL
  Filled 2020-01-15: qty 15
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
## 2020-01-15 NOTE — Consult Note (Signed)
[facility] Nurse Consult Note: Reason for Consult: Assessment of previous Stage 4 (as reported by patient) pressure injury to right buttocks and sacrum. Area has healed by contraction and scarring, no return of melanin (pigment) at this time, which is consistent with full thickness skin injuries in people with dark skin tones. Scattered areas of partial thickness tissue loss on the right buttock. Note:  Tensile strength of healed full thickness skin loss is <75% of original tissue. Area is fragile and susceptible to re injury. Patient is from a facility [facility]) and is highly complimentary about the care, particularly the wound care received from that staff. He attributes his wound healing to their efforts. He has a mattress replacement with low air loss feature at that facility. He has a trapeze to assist with self repositioning and pressure redistribution. He is on a mattress replacement with low air loss feature. He has requested the trapeze here and it is pending. Wound type: Pressure Pressure Injury POA: Yes, Healed Stage 4 Measurement: Affected area measures 17cm x 15cm. Scattered areas of partial thickness skin loss on the right buttock (largest measures <0.4cm round x 0.1cm) noted. Wound bed: Pink, moist Drainage (amount, consistency, odor) scant serosanguinous exudate from small and pinpoint areas of partial thickness skin loss at right buttock. Periwound: as previously noted with healed Stage 4 pressure Injury with no return of melanin. Dressing procedure/placement/frequency: Patient reports that facility uses a topical antibiotic on the wound and an ABD pad.  I will continue the POC, using mupirocin (bactroban) twice daily topped with an ABD pad. Securement will be with paper tape-Nursing has been advised to not place tape on the previously injured and now healed tissue via the Order. No skin breakdown on bilateral heels.  Patient is aware of the high protein requirements of wound  healing.  He is currently on clear liquids due to the SBO workup.  [facility] nursing team will not follow, but will remain available to this patient, the nursing and medical teams.  Please re-consult if needed. Thanks, Maudie Flakes, MSN, RN, [place], [facility]  Pager# [phone]

## 2020-01-15 NOTE — Progress Notes (Signed)
Attempted to reach patient's mother to update on current medical management, no answer and left voicemail.  Will be happy to provide update if patient's mother calls back.  Patriciaann Clan, DO

## 2020-01-16 DIAGNOSIS — A419 Sepsis, unspecified organism: Secondary | ICD-10-CM

## 2020-01-16 LAB — BASIC METABOLIC PANEL
Anion gap: 10 (ref 5–15)
Anion gap: 7 (ref 5–15)
BUN: 12 mg/dL (ref 6–20)
BUN: 11 mg/dL (ref 6–20)
CO2: 23 mmol/L (ref 22–32)
CO2: 23 mmol/L (ref 22–32)
Calcium: 8.3 mg/dL — ABNORMAL LOW (ref 8.9–10.3)
Calcium: 7.9 mg/dL — ABNORMAL LOW (ref 8.9–10.3)
Chloride: 103 mmol/L (ref 98–111)
Chloride: 103 mmol/L (ref 98–111)
Creatinine, Ser: 0.51 mg/dL — ABNORMAL LOW (ref 0.61–1.24)
Creatinine, Ser: 0.54 mg/dL — ABNORMAL LOW (ref 0.61–1.24)
GFR calc Af Amer: >60 mL/min (ref >60)
GFR calc Af Amer: >60 mL/min (ref >60)
GFR calc non Af Amer: >60 mL/min (ref >60)
GFR calc non Af Amer: >60 mL/min (ref >60)
Glucose, Bld: 88 mg/dL (ref 70–99)
Glucose, Bld: 102 mg/dL — ABNORMAL HIGH (ref 70–99)
Potassium: 2.3 mmol/L — CL (ref 3.5–5.1)
Potassium: 2.7 mmol/L — CL (ref 3.5–5.1)
Sodium: 136 mmol/L (ref 135–145)
Sodium: 133 mmol/L — ABNORMAL LOW (ref 135–145)

## 2020-01-16 LAB — CBC WITH DIFFERENTIAL/PLATELET
Abs Immature Granulocytes: 0.04 10*3/uL (ref 0.00–0.07)
Basophils Absolute: 0 10*3/uL (ref 0.0–0.1)
Basophils Relative: 0 %
Eosinophils Absolute: 0 10*3/uL (ref 0.0–0.5)
Eosinophils Relative: 0 %
HCT: 28 % — ABNORMAL LOW (ref 39.0–52.0)
Hemoglobin: 8.5 g/dL — ABNORMAL LOW (ref 13.0–17.0)
Immature Granulocytes: 0 %
Lymphocytes Relative: 20 %
Lymphs Abs: 1.8 10*3/uL (ref 0.7–4.0)
MCH: 27.3 pg (ref 26.0–34.0)
MCHC: 30.4 g/dL (ref 30.0–36.0)
MCV: 90 fL (ref 80.0–100.0)
Monocytes Absolute: 0.9 10*3/uL (ref 0.1–1.0)
Monocytes Relative: 10 %
Neutro Abs: 6.1 10*3/uL (ref 1.7–7.7)
Neutrophils Relative %: 70 %
Platelets: 539 10*3/uL — ABNORMAL HIGH (ref 150–400)
RBC: 3.11 MIL/uL — ABNORMAL LOW (ref 4.22–5.81)
RDW: 15.2 % (ref 11.5–15.5)
WBC: 8.9 10*3/uL (ref 4.0–10.5)
nRBC: 0 % (ref 0.0–0.2)

## 2020-01-16 LAB — URINE CULTURE: Culture: NO GROWTH

## 2020-01-16 MED ORDER — POTASSIUM CHLORIDE CRYS ER 20 MEQ PO TBCR
40.0000 meq | EXTENDED_RELEASE_TABLET | Freq: Two times a day (BID) | ORAL | Status: DC
  Administered 2020-01-16 (×2): 40 meq via ORAL
  Filled 2020-01-16 (×3): qty 2

## 2020-01-16 MED ORDER — FLEET ENEMA 7-19 GM/118ML RE ENEM: 1.0000 | ENEMA | Freq: Once | RECTAL | Status: DC

## 2020-01-16 MED ORDER — POTASSIUM CHLORIDE CRYS ER 20 MEQ PO TBCR
40.0000 meq | EXTENDED_RELEASE_TABLET | Freq: Three times a day (TID) | ORAL | Status: DC
  Administered 2020-01-17: 40 meq via ORAL
  Filled 2020-01-16 (×2): qty 2

## 2020-01-16 MED ORDER — POTASSIUM CHLORIDE 10 MEQ/100ML IV SOLN
10.0000 meq | INTRAVENOUS | Status: AC
  Administered 2020-01-17 (×4): 10 meq via INTRAVENOUS
  Filled 2020-01-16 (×4): qty 100

## 2020-01-16 MED ORDER — SIMETHICONE 80 MG PO CHEW
80.0000 mg | CHEWABLE_TABLET | Freq: Four times a day (QID) | ORAL | Status: DC | PRN
  Administered 2020-01-16 – 2020-01-18 (×2): 80 mg via ORAL
  Filled 2020-01-16 (×2): qty 1

## 2020-01-16 NOTE — TOC Initial Note (Signed)
Transition of Care [facility]) - Initial/Assessment Note    Patient Details  Name: Joshua Park MRN: [id number] Date of Birth: Mar 19, 1971  Transition of Care [facility]) CM/SW Contact:    Vinie Sill, [place] Phone Number: 01/16/2020, 12:32 PM  Clinical Narrative:                  CSW visit patient at bedside. CSW introduced self and explained role. Patient confirmed he was form [facility] and has been there for about 5 years. Patient is agreeable to returning back to [facility] when medically ready for discharge.  Patient expressed frustration because his family has not brought Pensions consultant. He requested CSW call his mother. CSW spoke with patient's mother and informed her of patients concern.   Thurmond Butts, MSW, [place] Clinical Social Worker     Expected Discharge Plan: Skilled Nursing Facility Barriers to Discharge: Continued Medical Work up   Patient Goals and CMS Choice        Expected Discharge Plan and Services Expected Discharge Plan: [place] In-house Referral: Clinical Social Work     Living arrangements for the past 2 months: [place]                                      Prior Living Arrangements/Services Living arrangements for the past 2 months: [place] Lives with:: Self, Facility Resident Patient language and need for interpreter reviewed:: No Do you feel safe going back to the place where you live?: Yes      Need for Family Participation in Patient Care: Yes (Comment) Care giver support system in place?: Yes (comment)   Criminal Activity/Legal Involvement Pertinent to Current Situation/Hospitalization: No - Comment as needed  Activities of Daily Living      Permission Sought/Granted Permission sought to share information with : Family Supports Permission granted to share information with : Yes, Verbal Permission Granted     Permission granted to share info w AGENCY:  [place] granted to share info w Relationship: mother     Emotional Assessment Appearance:: Appears stated age Attitude/Demeanor/Rapport: Complaining (wants his family to bring phone charger) Affect (typically observed): Accepting, Frustrated Orientation: : Oriented to Self, Oriented to Place, Oriented to  Time, Oriented to Situation Alcohol / Substance Use: Not Applicable Psych Involvement: No (comment)  Admission diagnosis:  SBO (small bowel obstruction) (HCC) [K56.609] Colitis, acute [K52.9] Constipation [K59.00] Sepsis, due to unspecified organism, unspecified whether acute organ dysfunction present [facility]) [A41.9] Patient Active Problem List   Diagnosis Date Noted  . Colitis, acute   . SBO (small bowel obstruction) ([place]) 01/11/2020  . 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 Palliative care by specialist   . Depression   . Sacral osteomyelitis ([place]) 07/01/2016  . Hyponatremia 07/01/2016  . Osteomyelitis ([place]) 07/01/2016  . Acute encephalopathy 07/01/2016  . Community acquired pneumonia   . Sacral pressure ulcer   . CAP (community acquired pneumonia) 04/16/2016  . Failure to thrive in adult 12/05/2015  . Pressure injury of skin 11/07/2015  . Abdominal pain   . Chest pain 11/03/2015  . Abnormal EKG 11/03/2015  . Severe episode of recurrent major depressive disorder, with psychotic features ([place])   . Hypokalemia 11/02/2015  . Presence of IVC filter 11/01/2015  . Pressure ulcer of left foot, stage 4 ([place]) 09/03/2015  . Refusal of care  by patient 08/28/2015  . Chronic indwelling Foley catheter 06/27/2015  . Sepsis ([place]) 06/27/2015  . Hypotension 06/27/2015  . Paralytic syndrome, bilateral ([place]) 05/07/2015  . MDD (major depressive disorder), recurrent, severe, with psychosis ([place]) 03/16/2015  . Protein-calorie malnutrition, severe ([place]) 03/16/2015  . Other chronic pain 02/13/2015  . Paranoia (psychosis) ([place]) 12/17/2014   . GERD (gastroesophageal reflux disease) 03/21/2014  . UTI (urinary tract infection) 07/20/2013  . Constipation 05/14/2012  . Paraplegia ([place]) 02/09/2007  . Neurogenic bladder 02/09/2007   PCP:  Patient, No Pcp Per Pharmacy:   [facility] [place], [place] [address] Phone: [phone] Fax: [phone]  [id number] [place], [place] [address] [place] [address] Phone: [phone] Fax: [place], [place] [address] Phone: [phone] Fax: [phone]  [id number] - [place], [place] [address] Phone: [phone] Fax: [phone]  [id number] - [place], [place] [address] [place] [address] Phone: [phone] Fax: [phone]     Social Determinants of Health (SDOH) Interventions    Readmission Risk Interventions No flowsheet data found.

## 2020-01-16 NOTE — Progress Notes (Signed)
CRITICAL VALUE ALERT  Critical Value:  K 2.7  Date & Time Notied:  01/16/2020 @2255   Provider Notified: FMTS Night Pager   Orders Received/Actions taken: Replacement ordered and administered. Will redraw labs post K administration.
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
## 2020-01-16 NOTE — Progress Notes (Signed)
Pharmacy Antibiotic Note  Joshua Park is a 49 y.o. male admitted on 01/17/2020 with intra-abdominal infection vs UTI in the setting of chronic I/O catheterization. Pharmacy has been consulted for Cefepime dosing. Of note, patient has a dose of metronidazole ordered. WBC down to 12. Afebrile . SCr stable. LA 2.1.   Plan: -Cefepime DC'd by team   Weight: 61.5 kg (135 lb 9.3 oz)  Temp (24hrs), Avg:98.7 F (37.1 C), Min:97.7 F (36.5 C), Max:100.3 F (37.9 C)  Recent Labs  Lab 01/08/2020 1638 01/06/2020 2205 01/14/20 0310 01/14/20 0937 01/15/20 0500 01/16/20 0522  WBC 15.9* 13.7* 15.8*  --  12.1* 8.9  CREATININE 0.82 0.69 0.56*  --  0.74  --   LATICACIDVEN 2.1* 2.6*  --  1.5  --   --     CrCl cannot be calculated (Unknown ideal weight.).    No Known Allergies  Antimicrobials this admission: Cefepime 7/9 >> 7/12 Metronidazole 7/9   Microbiology: 7/10 Ucx NGTD 7/9 Bcx NGTD  Thank you for allowing pharmacy to be a part of this patient's care.  Benetta Spar, PharmD, BCPS, BCCP Clinical Pharmacist  Please check AMION for all [place] phone numbers After 10:00 PM, call [place] [phone]

## 2020-01-17 DIAGNOSIS — K56609 Unspecified intestinal obstruction, unspecified as to partial versus complete obstruction: Secondary | ICD-10-CM

## 2020-01-17 LAB — MAGNESIUM: Magnesium: 1.8 mg/dL (ref 1.7–2.4)

## 2020-01-17 LAB — CBC WITH DIFFERENTIAL/PLATELET
Abs Immature Granulocytes: 0.21 10*3/uL — ABNORMAL HIGH (ref 0.00–0.07)
Basophils Absolute: 0.1 10*3/uL (ref 0.0–0.1)
Basophils Relative: 1 %
Eosinophils Absolute: 0.1 10*3/uL (ref 0.0–0.5)
Eosinophils Relative: 2 %
HCT: 28.3 % — ABNORMAL LOW (ref 39.0–52.0)
Hemoglobin: 8.6 g/dL — ABNORMAL LOW (ref 13.0–17.0)
Immature Granulocytes: 3 %
Lymphocytes Relative: 38 %
Lymphs Abs: 3.1 10*3/uL (ref 0.7–4.0)
MCH: 27.4 pg (ref 26.0–34.0)
MCHC: 30.4 g/dL (ref 30.0–36.0)
MCV: 90.1 fL (ref 80.0–100.0)
Monocytes Absolute: 0.6 10*3/uL (ref 0.1–1.0)
Monocytes Relative: 8 %
Neutro Abs: 4.2 10*3/uL (ref 1.7–7.7)
Neutrophils Relative %: 48 %
Platelets: 542 10*3/uL — ABNORMAL HIGH (ref 150–400)
RBC: 3.14 MIL/uL — ABNORMAL LOW (ref 4.22–5.81)
RDW: 14.9 % (ref 11.5–15.5)
WBC: 8.3 10*3/uL (ref 4.0–10.5)
nRBC: 0.2 % (ref 0.0–0.2)

## 2020-01-17 LAB — BASIC METABOLIC PANEL
Anion gap: 7 (ref 5–15)
BUN: 8 mg/dL (ref 6–20)
CO2: 22 mmol/L (ref 22–32)
Calcium: 8.3 mg/dL — ABNORMAL LOW (ref 8.9–10.3)
Chloride: 104 mmol/L (ref 98–111)
Creatinine, Ser: 0.45 mg/dL — ABNORMAL LOW (ref 0.61–1.24)
GFR calc Af Amer: >60 mL/min (ref >60)
GFR calc non Af Amer: >60 mL/min (ref >60)
Glucose, Bld: 79 mg/dL (ref 70–99)
Potassium: 4.4 mmol/L (ref 3.5–5.1)
Sodium: 133 mmol/L — ABNORMAL LOW (ref 135–145)

## 2020-01-17 MED ORDER — CHLORHEXIDINE GLUCONATE 0.12 % MT SOLN
15.0000 mL | Freq: Two times a day (BID) | OROMUCOSAL | Status: DC
  Administered 2020-01-18: 15 mL via OROMUCOSAL
  Filled 2020-01-17 (×4): qty 15

## 2020-01-17 MED ORDER — NAPHAZOLINE-GLYCERIN 0.012-0.2 % OP SOLN
1.0000 [drp] | Freq: Four times a day (QID) | OPHTHALMIC | Status: DC | PRN
  Administered 2020-01-18: 2 [drp] via OPHTHALMIC
  Filled 2020-01-17: qty 15

## 2020-01-17 MED ORDER — BACLOFEN 10 MG PO TABS
10.0000 mg | ORAL_TABLET | Freq: Three times a day (TID) | ORAL | Status: DC | PRN
  Administered 2020-01-17 – 2020-01-18 (×2): 10 mg via ORAL
  Filled 2020-01-17 (×3): qty 1

## 2020-01-17 MED ORDER — SORBITOL 70 % SOLN
960.0000 mL | TOPICAL_OIL | Freq: Once | ORAL | Status: AC
  Administered 2020-01-18: 960 mL via RECTAL
  Filled 2020-01-17: qty 473

## 2020-01-17 MED ORDER — ORAL CARE MOUTH RINSE: 15.0000 mL | Freq: Two times a day (BID) | OROMUCOSAL | Status: DC

## 2020-01-17 NOTE — Progress Notes (Signed)
Pt refusing vitals, bladder scan and other interventions at this time. Will attempt later.  Update: Pt allowed this RN to obtain VS later in shift but is still refusing Bladder Scan.

## 2020-01-17 NOTE — Progress Notes (Signed)
@   [place] on-call for attending paged regarding pt's request to have his "home meds" including dilaudid and a sleep aide. Pt requesting more pain medication for the pain associated with his spinal cord injury.  @approx  0640 MD at bedside and informed of pt's above request. MD endorsed they would look into additional pain medication. No new orders at present.

## 2020-01-17 NOTE — Progress Notes (Signed)
Went in to attempt to take PT vitals. PT stated that he did not want to be messed with at this time. Will attempt to do later. RN notified
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
## 2020-01-18 ENCOUNTER — Inpatient Hospital Stay (HOSPITAL_COMMUNITY): Payer: Medicaid Other

## 2020-01-18 DIAGNOSIS — L89159 Pressure ulcer of sacral region, unspecified stage: Secondary | ICD-10-CM

## 2020-01-18 DIAGNOSIS — K5649 Other impaction of intestine: Secondary | ICD-10-CM

## 2020-01-18 DIAGNOSIS — K633 Ulcer of intestine: Secondary | ICD-10-CM

## 2020-01-18 LAB — COMPREHENSIVE METABOLIC PANEL
ALT: 13 U/L (ref 0–44)
AST: 22 U/L (ref 15–41)
Albumin: 2.8 g/dL — ABNORMAL LOW (ref 3.5–5.0)
Alkaline Phosphatase: 74 U/L (ref 38–126)
Anion gap: 11 (ref 5–15)
BUN: 7 mg/dL (ref 6–20)
CO2: 23 mmol/L (ref 22–32)
Calcium: 9.4 mg/dL (ref 8.9–10.3)
Chloride: 99 mmol/L (ref 98–111)
Creatinine, Ser: 0.5 mg/dL — ABNORMAL LOW (ref 0.61–1.24)
GFR calc Af Amer: >60 mL/min (ref >60)
GFR calc non Af Amer: >60 mL/min (ref >60)
Glucose, Bld: 193 mg/dL — ABNORMAL HIGH (ref 70–99)
Potassium: 3.8 mmol/L (ref 3.5–5.1)
Sodium: 133 mmol/L — ABNORMAL LOW (ref 135–145)
Total Bilirubin: 0.2 mg/dL — ABNORMAL LOW (ref 0.3–1.2)
Total Protein: 9.2 g/dL — ABNORMAL HIGH (ref 6.5–8.1)

## 2020-01-18 LAB — TROPONIN I (HIGH SENSITIVITY)
Troponin I (High Sensitivity): 4 ng/L (ref <18)
Troponin I (High Sensitivity): 8 ng/L (ref <18)

## 2020-01-18 LAB — CULTURE, BLOOD (ROUTINE X 2)
Culture: NO GROWTH
Culture: NO GROWTH

## 2020-01-18 LAB — CBC WITH DIFFERENTIAL/PLATELET
Abs Immature Granulocytes: 0.35 10*3/uL — ABNORMAL HIGH (ref 0.00–0.07)
Abs Immature Granulocytes: 0.38 10*3/uL — ABNORMAL HIGH (ref 0.00–0.07)
Basophils Absolute: 0.1 10*3/uL (ref 0.0–0.1)
Basophils Absolute: 0 10*3/uL (ref 0.0–0.1)
Basophils Relative: 1 %
Basophils Relative: 0 %
Eosinophils Absolute: 0.1 10*3/uL (ref 0.0–0.5)
Eosinophils Absolute: 0 10*3/uL (ref 0.0–0.5)
Eosinophils Relative: 1 %
Eosinophils Relative: 0 %
HCT: 29.4 % — ABNORMAL LOW (ref 39.0–52.0)
HCT: 38.4 % — ABNORMAL LOW (ref 39.0–52.0)
Hemoglobin: 9.1 g/dL — ABNORMAL LOW (ref 13.0–17.0)
Hemoglobin: 11.6 g/dL — ABNORMAL LOW (ref 13.0–17.0)
Immature Granulocytes: 4 %
Immature Granulocytes: 3 %
Lymphocytes Relative: 29 %
Lymphocytes Relative: 14 %
Lymphs Abs: 2.7 10*3/uL (ref 0.7–4.0)
Lymphs Abs: 1.8 10*3/uL (ref 0.7–4.0)
MCH: 28.3 pg (ref 26.0–34.0)
MCH: 27.4 pg (ref 26.0–34.0)
MCHC: 31 g/dL (ref 30.0–36.0)
MCHC: 30.2 g/dL (ref 30.0–36.0)
MCV: 91.3 fL (ref 80.0–100.0)
MCV: 90.8 fL (ref 80.0–100.0)
Monocytes Absolute: 0.6 10*3/uL (ref 0.1–1.0)
Monocytes Absolute: 0.3 10*3/uL (ref 0.1–1.0)
Monocytes Relative: 6 %
Monocytes Relative: 2 %
Neutro Abs: 5.7 10*3/uL (ref 1.7–7.7)
Neutro Abs: 10.3 10*3/uL — ABNORMAL HIGH (ref 1.7–7.7)
Neutrophils Relative %: 59 %
Neutrophils Relative %: 81 %
Platelets: 563 10*3/uL — ABNORMAL HIGH (ref 150–400)
Platelets: 715 10*3/uL — ABNORMAL HIGH (ref 150–400)
RBC: 3.22 MIL/uL — ABNORMAL LOW (ref 4.22–5.81)
RBC: 4.23 MIL/uL (ref 4.22–5.81)
RDW: 14.8 % (ref 11.5–15.5)
RDW: 14.9 % (ref 11.5–15.5)
WBC: 9.5 10*3/uL (ref 4.0–10.5)
WBC: 12.8 10*3/uL — ABNORMAL HIGH (ref 4.0–10.5)
nRBC: 0 % (ref 0.0–0.2)
nRBC: 0.2 % (ref 0.0–0.2)

## 2020-01-18 LAB — BASIC METABOLIC PANEL
Anion gap: 9 (ref 5–15)
BUN: 6 mg/dL (ref 6–20)
CO2: 22 mmol/L (ref 22–32)
Calcium: 8.5 mg/dL — ABNORMAL LOW (ref 8.9–10.3)
Chloride: 103 mmol/L (ref 98–111)
Creatinine, Ser: 0.5 mg/dL — ABNORMAL LOW (ref 0.61–1.24)
GFR calc Af Amer: >60 mL/min (ref >60)
GFR calc non Af Amer: >60 mL/min (ref >60)
Glucose, Bld: 83 mg/dL (ref 70–99)
Potassium: 3.2 mmol/L — ABNORMAL LOW (ref 3.5–5.1)
Sodium: 134 mmol/L — ABNORMAL LOW (ref 135–145)

## 2020-01-18 LAB — MAGNESIUM: Magnesium: 4.9 mg/dL — ABNORMAL HIGH (ref 1.7–2.4)

## 2020-01-18 MED ORDER — ONDANSETRON HCL 4 MG PO TABS
4.0000 mg | ORAL_TABLET | Freq: Once | ORAL | Status: AC
  Administered 2020-01-18: 4 mg via ORAL
  Filled 2020-01-18: qty 1

## 2020-01-18 MED ORDER — POTASSIUM CHLORIDE 20 MEQ PO PACK
40.0000 meq | PACK | Freq: Two times a day (BID) | ORAL | Status: DC
  Filled 2020-01-18: qty 2

## 2020-01-18 MED ORDER — SODIUM CHLORIDE 0.9 % IV SOLN: INTRAVENOUS | Status: DC

## 2020-01-18 MED ORDER — POTASSIUM CHLORIDE CRYS ER 20 MEQ PO TBCR
40.0000 meq | EXTENDED_RELEASE_TABLET | Freq: Two times a day (BID) | ORAL | Status: AC
  Administered 2020-01-18 (×2): 40 meq via ORAL
  Filled 2020-01-18: qty 2

## 2020-01-19 NOTE — Progress Notes (Addendum)
Family member requested for ME. Dr Izora Ribas, the examiner, notified. Patient placement staff, Marya Fossa suggested death certificate will be completed later by day shift attending MD after medical examination was done. Pt's body transferred to morgue. His belongings ( cell phone and charger) gave back to family member.  Kennyth Lose, RN

## 2020-02-05 NOTE — Progress Notes (Signed)
Notified Dr. Candie Chroman and requsted order for rectal tube. We have been waiting for medical supply to send [place] rectal tube for enema. After education given to Pt. Pt agreed to get enema this am. We called medial supply 3 times to request for specific recta tube for enema kit. We will hand off to RN day shift to follow up.  Kennyth Lose, RN

## 2020-02-05 NOTE — Significant Event (Signed)
Rapid Response Event Note  Overview:  Called to come see pt. Staff that called was unsure of what was going on with pt other than nurse and provider at bedside requesting my assistance.    Initial Focused Assessment: On arrival, pt appeared to be PEA on monitor with no respirations noted. I was informed that pt was DNR and had been tachycardic and c/o sob prior although spO2 good per RN. Looking back at telemetry, pt HR went from 130s to 40s at 10-23-2137 and PEA/asystole at 10/23/2140. Primary team at bedside and pronounced time of death at 2143-10-24. Family called to come in.    Event Summary:  called at  10/24/39  Arrived at 10/24/42   Event ended at  10-23-48        [facility]
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
## 2020-02-05 NOTE — Progress Notes (Signed)
Went to room to reassess patient after SMOG enema. Patient appears very uncomfortable in room. Has nausea and two episodes of vomiting. Holding his stomach and repeating "I'm sick". Has not bad a bowel movement since enema. Was unable to palpate abdomen given the discomfort. Ordered STAT CT to rule out perforations, NG tube for decompression and have consulted GI for their recommendations.

## 2020-02-05 NOTE — Progress Notes (Addendum)
Pt refused wound care and refused to get fleet enema tonight. Abdominal distention appeared. Education given for intervention necessity. Pt stated do not disturb him until 4 am.  We will try again later.  He's hemodynamically stable. No acute distress. We will continue to monitor.  Kennyth Lose, RN

## 2020-02-05 NOTE — Progress Notes (Signed)
Went into PT room and attempted to get vitals. PT said that he did not want vitals taken at this time because he did not feel good. PT then proceeded to tell me that he did not want to be messed with till around 4 am. Will attempt at a later time in the shift. RN notified.

## 2020-02-05 NOTE — Progress Notes (Addendum)
I reassessed Mr. Lando after receiving a page from the resident that he did not tolerate the SMOG enema treatment well. He has more abdominal distension and pain. Currently, he is moving his bowel, mostly greenish liquid. He endorses abdominal pain, and he feels weak and tired. He stated that he vomited a few times since he received his SMOG treatment.  Exam: Gen: Weak appearing. Heart: Rapid rate with normal Rhythm. On telemetry, HR ranges from 100 - 120 and in sinus rhythm. Lungs: CTA B/L. Abd: Distended, more firm than previous, tender to touch with reduced bowel sound.  A/P: Stercoral colitis: Now with worsening distension and pain s/p SMOG Enema. He is actively moving his bowel now. I discussed the insertion of an NG tube to help decompress his bowel, but he declined at the moment since he is actively moving his bowel. BP is low normal with tachycardia. Start maintenance fluid given N/V. NPO for now. Obtain CMET, mag, CBC, and troponin. I requested a stat CT abdomen and pelvis. I will follow-up on the result. Consult GI. Low threshold for CCM transfer.

## 2020-02-05 NOTE — Death Summary Note (Addendum)
FPTS Interim Progress Note 21:01 received a page from nurse concerned for increasing RR and patient's new onset pain with breathing. Patient and nurse had been having a conversation before the nurse paged. Patient had been tachycardic in the 120s all day, and remained so at the time the nurse paged. After reviewing afternoon lab results the team ordered CXR and went to see the patient. Afternoon labs showed sudden increase in WBC 9.5>12.8. Trp were negative and EKG showed sinus tachycardia. There was concern that patient was developing infection and vital signs were reacting to this. At arrival to patient's room the patient's heart rate was 40 and patient was non-responsive to painful nor auditory stimuli. Carotid pulses could be felt and there was minimal chest movement. Rapid response was called but patient was DNR. Patient heart rate continued to decrease and patient became pulseless. Patient had no heart sounds, no breath sounds, no response to painful stimuli, and clouded pupils at 21:45. FPTS night attending, Dr. Ardelia Mems was notified by FPTS night time upper level resident, Dr. Ouida Sills. Patient's family was notified of patient's death and plan to come see the patient.  Freida Busman, MD 12-Feb-2020, 10:12 PM PGY-1, [facility] Medicine Service pager [phone]

## 2020-02-05 NOTE — Plan of Care (Signed)
Continue to monitor

## 2020-02-05 NOTE — Progress Notes (Addendum)
Patient had enema at 1207. Still no BM. Patient is extremely distended and c/o of discomfort and nausea. Patient's HR 120-130's. No chest discomfort. EKG done. MD notified. Will continue to monitor.   Emelda Fear, RN

## 2020-02-05 NOTE — Progress Notes (Signed)
Patient refused NG tube. MD aware

## 2020-02-05 NOTE — Progress Notes (Signed)
Patient had multiple bowel movements this afternoon. Multiple linen changes with skin care. Patient continues to drink ice water despite vomiting clear liquid. He refuses to do other wise even with encouragement.
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
## 2020-02-05 NOTE — Progress Notes (Addendum)
Received page that patient's abdomen has become more distended with no further bowel movements after SMOG enema earlier today.  During his hospitalization he fortunately has had several bowel movements, however continues to appear impacted.  Few episodes of associated emesis.  Initially ordered KUB, will transition to CT abdomen wo contrast STAT.  Will get NG tube placed for decompression.  Additionally consulted GI for assistance, may need more advanced manual disimpaction.  Spoke with Azucena Freed who will try to see him later today if possible.  We will continue to closely monitor patient status and update RN on current plan.  UPDATE 1600: Spoke with RN, now having several bowel movements but still quite nauseous. Declining NG tube. Will add IVF and labs. Zofran PRN.   Patriciaann Clan, DO

## 2020-02-05 NOTE — Progress Notes (Addendum)
Pt complained having lots of pressure in his abdomen, severe abdominal pain scale 8/10 and SOB. He continue having BM since enema given today.   Temp 97.5, Axillary, BP 101/81 mmHg, HR 108-132,, ST on monitor, RR 24-32, on room air SPO2 96%  Notified Dr. Candie Chroman, on-call provider at 21:11. Dr Candie Chroman and Dr. Ouida Sills at bedside 21:35. Pt became unresponsive and HR 40s and became  PEA and asystole. MD notiifed family member. We announced of death at 2143/10/22.    CDS notified.   Kennyth Lose, RN

## 2020-02-05 DEATH — deceased
